# Patient Record
Sex: Female | Born: 1940
Health system: Southern US, Community
[De-identification: ages and names within clinical notes are randomized; demographics above are authoritative.]

## PROBLEM LIST (undated history)

## (undated) DIAGNOSIS — E782 Mixed hyperlipidemia: Secondary | ICD-10-CM

## (undated) DIAGNOSIS — I10 Essential (primary) hypertension: Secondary | ICD-10-CM

## (undated) DIAGNOSIS — K219 Gastro-esophageal reflux disease without esophagitis: Secondary | ICD-10-CM

## (undated) DIAGNOSIS — I2510 Atherosclerotic heart disease of native coronary artery without angina pectoris: Secondary | ICD-10-CM

## (undated) DIAGNOSIS — C50919 Malignant neoplasm of unspecified site of unspecified female breast: Secondary | ICD-10-CM

## (undated) DIAGNOSIS — I701 Atherosclerosis of renal artery: Secondary | ICD-10-CM

## (undated) DIAGNOSIS — I6522 Occlusion and stenosis of left carotid artery: Secondary | ICD-10-CM

## (undated) DIAGNOSIS — M1990 Unspecified osteoarthritis, unspecified site: Secondary | ICD-10-CM

## (undated) DIAGNOSIS — I639 Cerebral infarction, unspecified: Secondary | ICD-10-CM

## (undated) DIAGNOSIS — I83891 Varicose veins of right lower extremities with other complications: Secondary | ICD-10-CM

## (undated) DIAGNOSIS — G609 Hereditary and idiopathic neuropathy, unspecified: Secondary | ICD-10-CM

## (undated) DIAGNOSIS — R609 Edema, unspecified: Secondary | ICD-10-CM

## (undated) DIAGNOSIS — T4145XA Adverse effect of unspecified anesthetic, initial encounter: Secondary | ICD-10-CM

## (undated) DIAGNOSIS — Z9889 Other specified postprocedural states: Secondary | ICD-10-CM

## (undated) DIAGNOSIS — G4730 Sleep apnea, unspecified: Secondary | ICD-10-CM

## (undated) DIAGNOSIS — R112 Nausea with vomiting, unspecified: Secondary | ICD-10-CM

## (undated) HISTORY — PX: APPENDECTOMY: SHX54

## (undated) HISTORY — DX: Atherosclerotic heart disease of native coronary artery without angina pectoris: I25.10

## (undated) HISTORY — DX: Edema, unspecified: R60.9

## (undated) HISTORY — DX: Essential (primary) hypertension: I10

## (undated) HISTORY — PX: HEMORRHOID SURGERY: SHX153

## (undated) HISTORY — DX: Atherosclerosis of renal artery: I70.1

## (undated) HISTORY — DX: Mixed hyperlipidemia: E78.2

## (undated) HISTORY — PX: BREAST SURGERY: SHX581

## (undated) HISTORY — DX: Malignant neoplasm of unspecified site of unspecified female breast: C50.919

---
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
## 2001-12-26 ENCOUNTER — Ambulatory Visit (HOSPITAL_COMMUNITY): Admission: RE | Admit: 2001-12-26 | Discharge: 2001-12-26 | Payer: Self-pay | Admitting: Cardiology

## 2001-12-31 ENCOUNTER — Ambulatory Visit (HOSPITAL_COMMUNITY): Admission: RE | Admit: 2001-12-31 | Discharge: 2002-01-01 | Payer: Self-pay | Admitting: Cardiology

## 2002-02-16 ENCOUNTER — Encounter: Payer: Self-pay | Admitting: General Surgery

## 2002-02-24 ENCOUNTER — Ambulatory Visit (HOSPITAL_COMMUNITY): Admission: RE | Admit: 2002-02-24 | Discharge: 2002-02-24 | Payer: Self-pay | Admitting: General Surgery

## 2002-02-24 ENCOUNTER — Encounter (INDEPENDENT_AMBULATORY_CARE_PROVIDER_SITE_OTHER): Payer: Self-pay

## 2002-07-06 ENCOUNTER — Encounter: Admission: RE | Admit: 2002-07-06 | Discharge: 2002-08-11 | Payer: Self-pay | Admitting: Orthopaedic Surgery

## 2003-06-18 ENCOUNTER — Ambulatory Visit (HOSPITAL_COMMUNITY): Admission: RE | Admit: 2003-06-18 | Discharge: 2003-06-19 | Payer: Self-pay | Admitting: Cardiology

## 2003-12-07 ENCOUNTER — Ambulatory Visit: Payer: Self-pay | Admitting: Family Medicine

## 2004-01-23 DIAGNOSIS — Z923 Personal history of irradiation: Secondary | ICD-10-CM

## 2004-01-23 DIAGNOSIS — C50919 Malignant neoplasm of unspecified site of unspecified female breast: Secondary | ICD-10-CM

## 2004-01-23 HISTORY — PX: OTHER SURGICAL HISTORY: SHX169

## 2004-01-23 HISTORY — DX: Personal history of irradiation: Z92.3

## 2004-01-23 HISTORY — DX: Malignant neoplasm of unspecified site of unspecified female breast: C50.919

## 2004-03-08 ENCOUNTER — Ambulatory Visit: Payer: Self-pay | Admitting: Family Medicine

## 2004-03-15 ENCOUNTER — Ambulatory Visit: Payer: Self-pay

## 2004-04-07 ENCOUNTER — Ambulatory Visit: Payer: Self-pay | Admitting: Cardiology

## 2004-04-11 ENCOUNTER — Ambulatory Visit: Payer: Self-pay | Admitting: Cardiology

## 2004-04-12 ENCOUNTER — Ambulatory Visit: Payer: Self-pay | Admitting: Cardiology

## 2004-04-26 ENCOUNTER — Ambulatory Visit: Payer: Self-pay

## 2004-05-08 ENCOUNTER — Ambulatory Visit: Payer: Self-pay | Admitting: Cardiology

## 2004-05-12 ENCOUNTER — Ambulatory Visit (HOSPITAL_COMMUNITY): Admission: RE | Admit: 2004-05-12 | Discharge: 2004-05-13 | Payer: Self-pay | Admitting: Cardiology

## 2004-05-12 ENCOUNTER — Ambulatory Visit: Payer: Self-pay | Admitting: Internal Medicine

## 2004-05-29 ENCOUNTER — Ambulatory Visit: Payer: Self-pay | Admitting: Cardiology

## 2004-06-08 ENCOUNTER — Ambulatory Visit: Payer: Self-pay

## 2004-06-21 ENCOUNTER — Ambulatory Visit: Payer: Self-pay | Admitting: Cardiology

## 2004-07-10 ENCOUNTER — Ambulatory Visit: Payer: Self-pay | Admitting: Family Medicine

## 2004-10-06 ENCOUNTER — Ambulatory Visit: Payer: Self-pay

## 2004-10-23 ENCOUNTER — Ambulatory Visit: Payer: Self-pay | Admitting: Cardiology

## 2004-11-06 ENCOUNTER — Ambulatory Visit: Admission: RE | Admit: 2004-11-06 | Discharge: 2005-01-12 | Payer: Self-pay | Admitting: *Deleted

## 2004-11-13 ENCOUNTER — Ambulatory Visit: Payer: Self-pay | Admitting: Family Medicine

## 2004-11-28 ENCOUNTER — Ambulatory Visit: Payer: Self-pay | Admitting: Cardiology

## 2005-01-04 ENCOUNTER — Ambulatory Visit: Payer: Self-pay | Admitting: Cardiology

## 2005-05-30 ENCOUNTER — Ambulatory Visit: Payer: Self-pay

## 2005-06-29 ENCOUNTER — Ambulatory Visit: Payer: Self-pay | Admitting: Cardiology

## 2005-07-19 ENCOUNTER — Ambulatory Visit: Payer: Self-pay | Admitting: Family Medicine

## 2005-11-20 ENCOUNTER — Ambulatory Visit: Payer: Self-pay | Admitting: Family Medicine

## 2006-01-22 HISTORY — PX: ROTATOR CUFF REPAIR: SHX139

## 2006-03-12 ENCOUNTER — Ambulatory Visit: Payer: Self-pay | Admitting: Family Medicine

## 2006-05-07 ENCOUNTER — Ambulatory Visit: Payer: Self-pay | Admitting: Family Medicine

## 2006-05-30 ENCOUNTER — Ambulatory Visit: Payer: Self-pay | Admitting: Family Medicine

## 2006-06-12 ENCOUNTER — Ambulatory Visit: Payer: Self-pay

## 2006-06-12 ENCOUNTER — Ambulatory Visit: Payer: Self-pay | Admitting: Cardiology

## 2006-07-04 ENCOUNTER — Ambulatory Visit: Payer: Self-pay | Admitting: Family Medicine

## 2006-08-15 ENCOUNTER — Ambulatory Visit: Payer: Self-pay | Admitting: Cardiology

## 2006-08-16 ENCOUNTER — Encounter: Payer: Self-pay | Admitting: Physician Assistant

## 2006-08-19 ENCOUNTER — Ambulatory Visit: Payer: Self-pay | Admitting: Cardiology

## 2006-08-22 ENCOUNTER — Encounter: Payer: Self-pay | Admitting: Cardiology

## 2006-08-26 ENCOUNTER — Ambulatory Visit: Payer: Self-pay | Admitting: Cardiology

## 2006-09-03 ENCOUNTER — Ambulatory Visit: Payer: Self-pay | Admitting: Cardiology

## 2006-10-01 ENCOUNTER — Observation Stay (HOSPITAL_COMMUNITY): Admission: RE | Admit: 2006-10-01 | Discharge: 2006-10-03 | Payer: Self-pay | Admitting: Orthopaedic Surgery

## 2006-10-21 ENCOUNTER — Encounter: Admission: RE | Admit: 2006-10-21 | Discharge: 2006-11-18 | Payer: Self-pay | Admitting: Orthopaedic Surgery

## 2006-10-29 ENCOUNTER — Ambulatory Visit: Payer: Self-pay | Admitting: Cardiology

## 2006-10-29 ENCOUNTER — Encounter: Payer: Self-pay | Admitting: Physician Assistant

## 2007-06-18 ENCOUNTER — Ambulatory Visit: Payer: Self-pay | Admitting: Cardiology

## 2007-07-22 ENCOUNTER — Ambulatory Visit: Payer: Self-pay

## 2007-09-09 ENCOUNTER — Encounter: Payer: Self-pay | Admitting: Cardiology

## 2008-01-05 ENCOUNTER — Ambulatory Visit: Payer: Self-pay | Admitting: Cardiology

## 2008-02-05 ENCOUNTER — Encounter: Payer: Self-pay | Admitting: Cardiology

## 2008-07-29 ENCOUNTER — Encounter: Payer: Self-pay | Admitting: Cardiology

## 2008-07-29 ENCOUNTER — Ambulatory Visit: Payer: Self-pay

## 2008-07-29 DIAGNOSIS — I701 Atherosclerosis of renal artery: Secondary | ICD-10-CM

## 2008-08-05 ENCOUNTER — Ambulatory Visit: Payer: Self-pay | Admitting: Cardiology

## 2008-10-26 ENCOUNTER — Encounter: Payer: Self-pay | Admitting: Cardiology

## 2008-11-07 DIAGNOSIS — R609 Edema, unspecified: Secondary | ICD-10-CM

## 2008-11-07 DIAGNOSIS — I2510 Atherosclerotic heart disease of native coronary artery without angina pectoris: Secondary | ICD-10-CM

## 2008-11-07 DIAGNOSIS — R600 Localized edema: Secondary | ICD-10-CM | POA: Insufficient documentation

## 2008-11-07 DIAGNOSIS — I739 Peripheral vascular disease, unspecified: Secondary | ICD-10-CM | POA: Insufficient documentation

## 2008-11-07 DIAGNOSIS — E785 Hyperlipidemia, unspecified: Secondary | ICD-10-CM

## 2008-11-21 ENCOUNTER — Emergency Department (HOSPITAL_COMMUNITY): Admission: EM | Admit: 2008-11-21 | Discharge: 2008-11-21 | Payer: Self-pay | Admitting: Emergency Medicine

## 2009-02-03 ENCOUNTER — Encounter: Payer: Self-pay | Admitting: Cardiology

## 2009-03-17 ENCOUNTER — Ambulatory Visit: Payer: Self-pay | Admitting: Cardiology

## 2009-03-17 DIAGNOSIS — I10 Essential (primary) hypertension: Secondary | ICD-10-CM

## 2009-03-17 DIAGNOSIS — I6529 Occlusion and stenosis of unspecified carotid artery: Secondary | ICD-10-CM

## 2009-03-28 ENCOUNTER — Encounter: Payer: Self-pay | Admitting: Cardiology

## 2009-04-01 ENCOUNTER — Encounter: Payer: Self-pay | Admitting: Cardiology

## 2009-04-12 ENCOUNTER — Encounter (INDEPENDENT_AMBULATORY_CARE_PROVIDER_SITE_OTHER): Payer: Self-pay | Admitting: *Deleted

## 2009-07-07 ENCOUNTER — Emergency Department (HOSPITAL_COMMUNITY): Admission: EM | Admit: 2009-07-07 | Discharge: 2009-07-07 | Payer: Self-pay | Admitting: Emergency Medicine

## 2009-07-07 ENCOUNTER — Encounter: Payer: Self-pay | Admitting: Cardiology

## 2009-07-09 ENCOUNTER — Encounter: Payer: Self-pay | Admitting: Cardiology

## 2009-07-29 ENCOUNTER — Encounter: Payer: Self-pay | Admitting: Cardiology

## 2009-08-01 ENCOUNTER — Encounter: Payer: Self-pay | Admitting: Cardiovascular Disease

## 2009-08-01 ENCOUNTER — Ambulatory Visit: Payer: Self-pay

## 2009-08-01 ENCOUNTER — Encounter: Payer: Self-pay | Admitting: Cardiology

## 2009-08-19 ENCOUNTER — Ambulatory Visit: Payer: Self-pay | Admitting: Cardiology

## 2009-09-15 ENCOUNTER — Encounter (INDEPENDENT_AMBULATORY_CARE_PROVIDER_SITE_OTHER): Payer: Self-pay | Admitting: *Deleted

## 2009-09-15 DIAGNOSIS — IMO0002 Reserved for concepts with insufficient information to code with codable children: Secondary | ICD-10-CM

## 2009-09-27 ENCOUNTER — Encounter: Payer: Self-pay | Admitting: Infectious Disease

## 2009-09-27 ENCOUNTER — Encounter (INDEPENDENT_AMBULATORY_CARE_PROVIDER_SITE_OTHER): Payer: Self-pay | Admitting: *Deleted

## 2009-09-27 ENCOUNTER — Ambulatory Visit: Payer: Self-pay | Admitting: Internal Medicine

## 2009-09-28 ENCOUNTER — Encounter: Payer: Self-pay | Admitting: Internal Medicine

## 2009-12-19 IMAGING — CR DG HIP COMPLETE 2+V*R*
3 series · 3 of 3 positions shown · non-contrast
Comparison: None

CLINICAL DATA: Fall.  Right posterior hip pain.

RIGHT HIP - COMPLETE 2+ VIEW

[t pelvis a.p.]
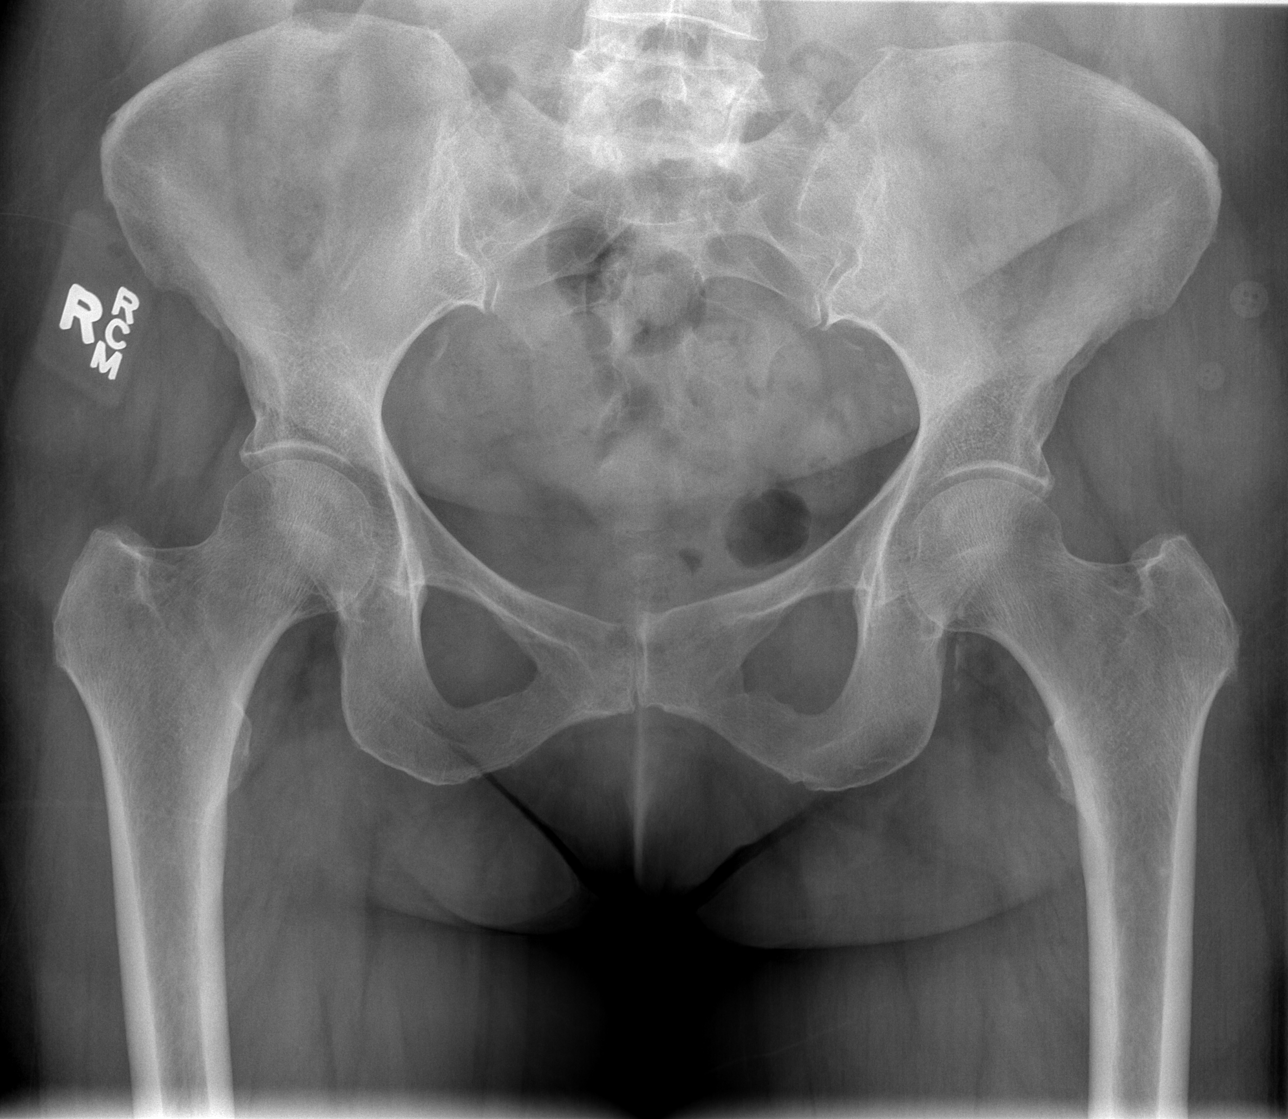

[t hip ap right]
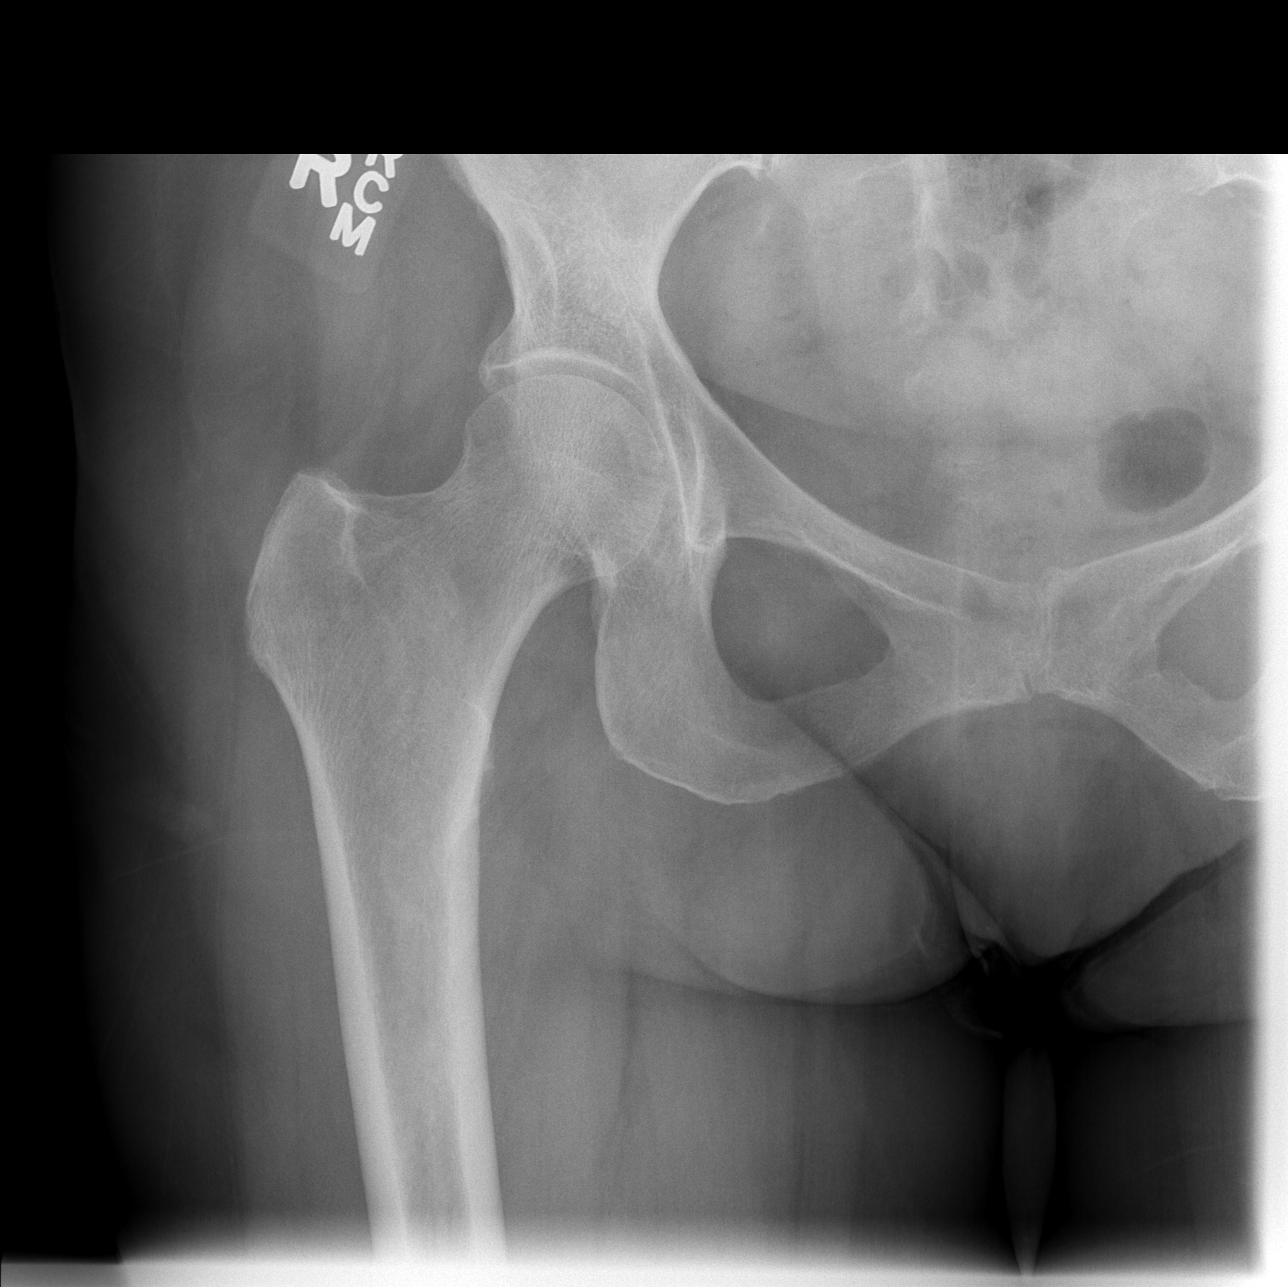

[t hip frog leg right]
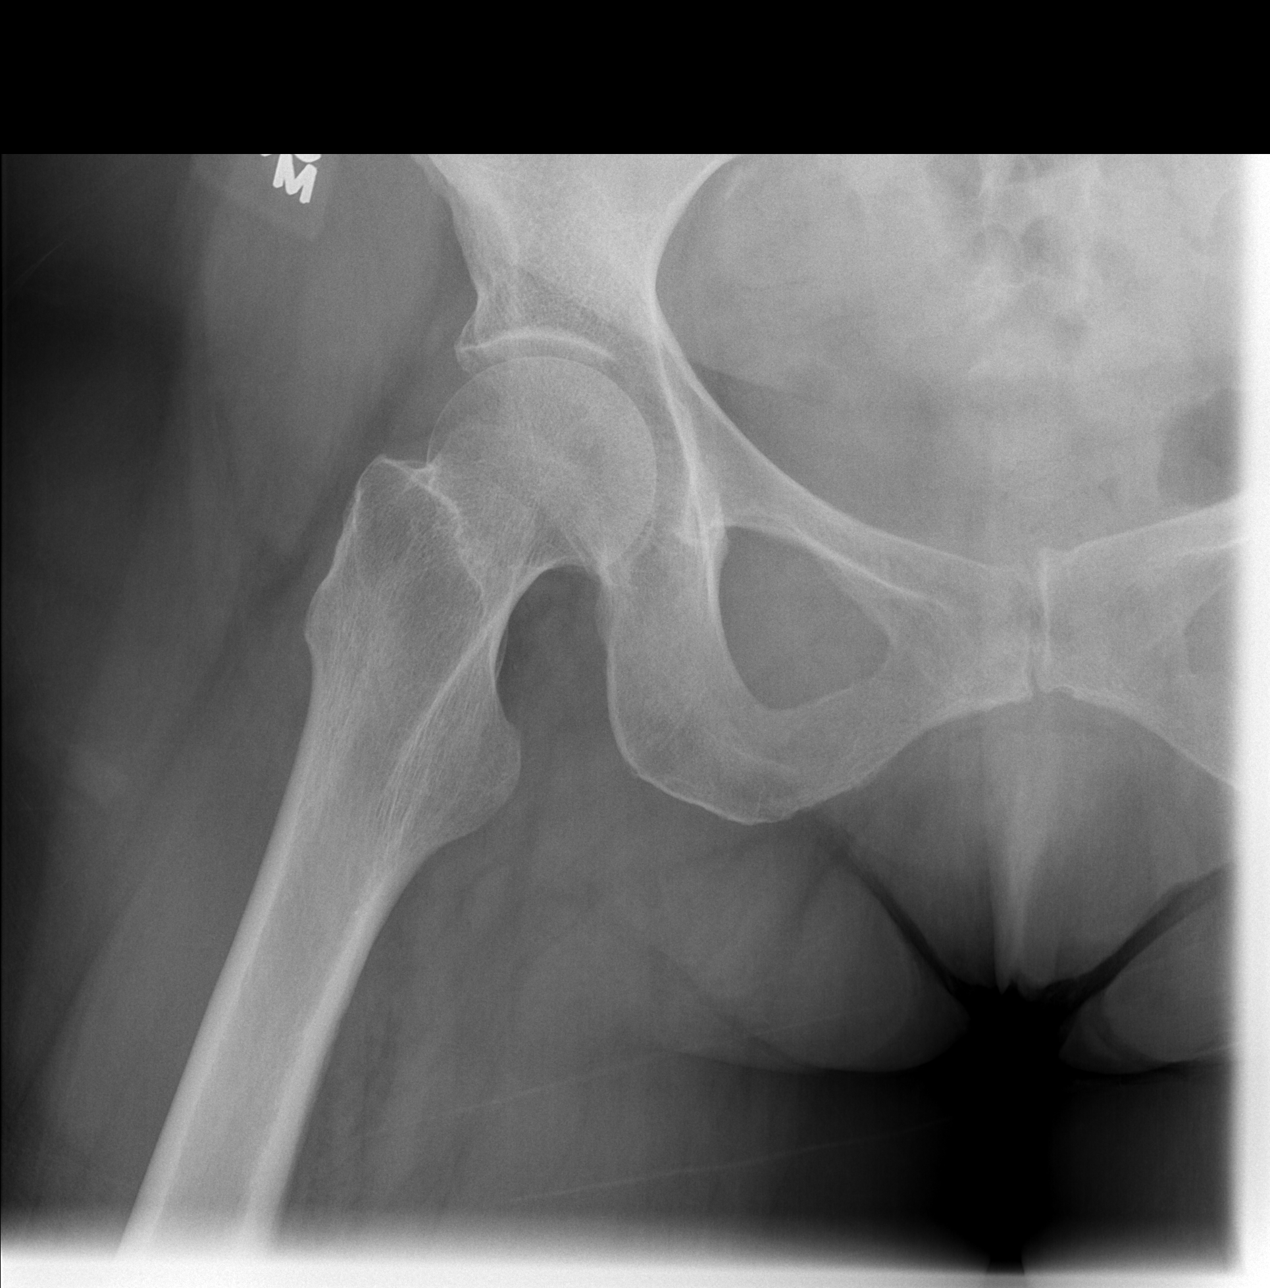

[3 of 3 positions shown; findings below may reference images not displayed]

FINDINGS: No fracture or subluxation.
IMPRESSION: Negative right hip.

## 2009-12-19 IMAGING — CR DG KNEE COMPLETE 4+V*R*
4 series · 4 of 4 positions shown · non-contrast
Comparison: None

CLINICAL DATA: Right knee pain, bruising, and swelling secondary to
a fall today.

RIGHT KNEE - COMPLETE 4+ VIEW

[t knee ap right]
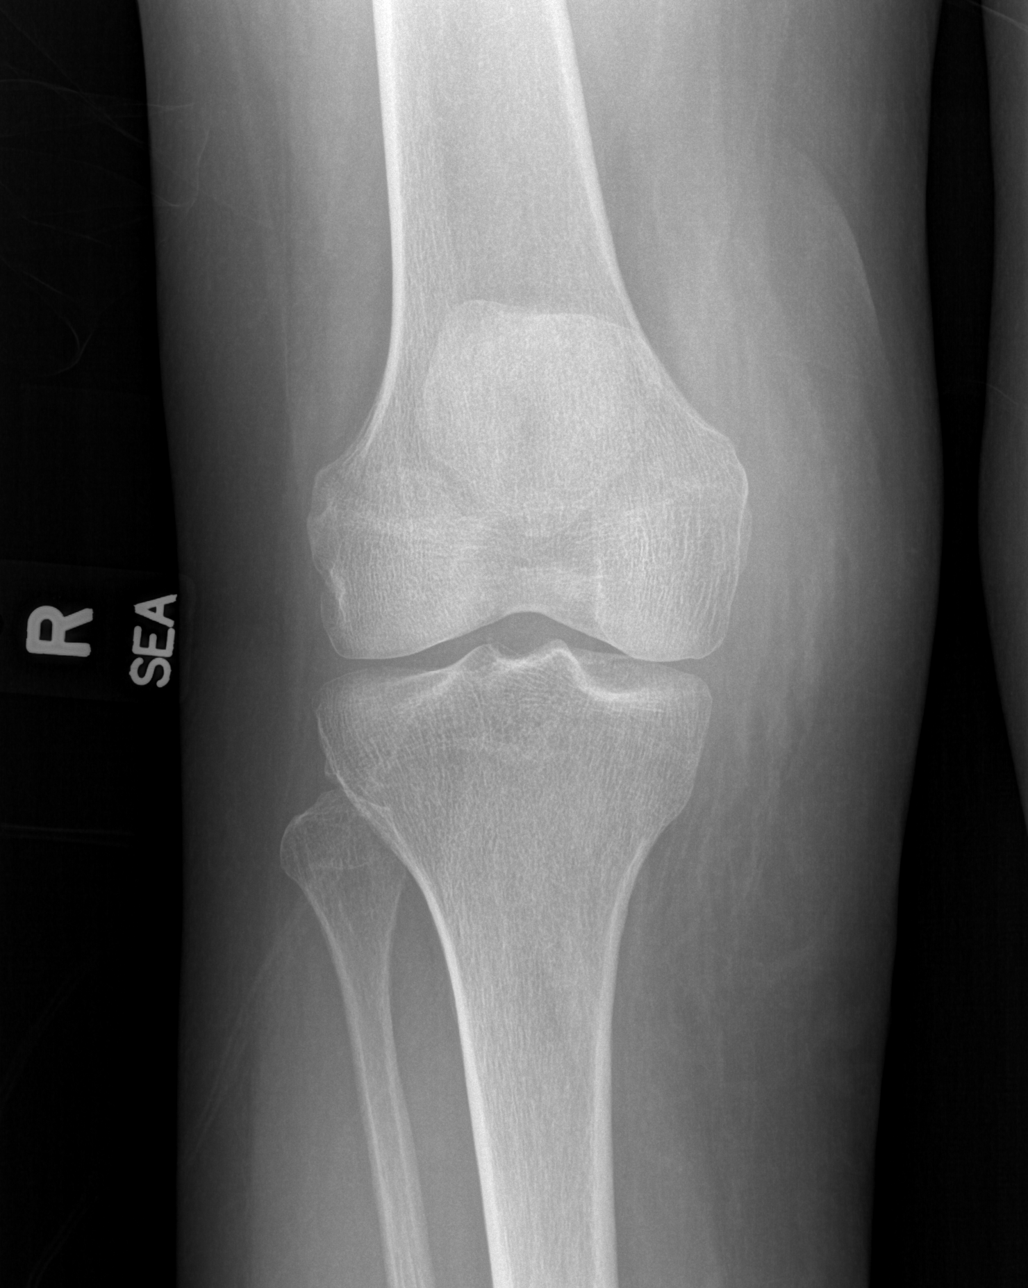

[t knee oblique right (1 of 2)]
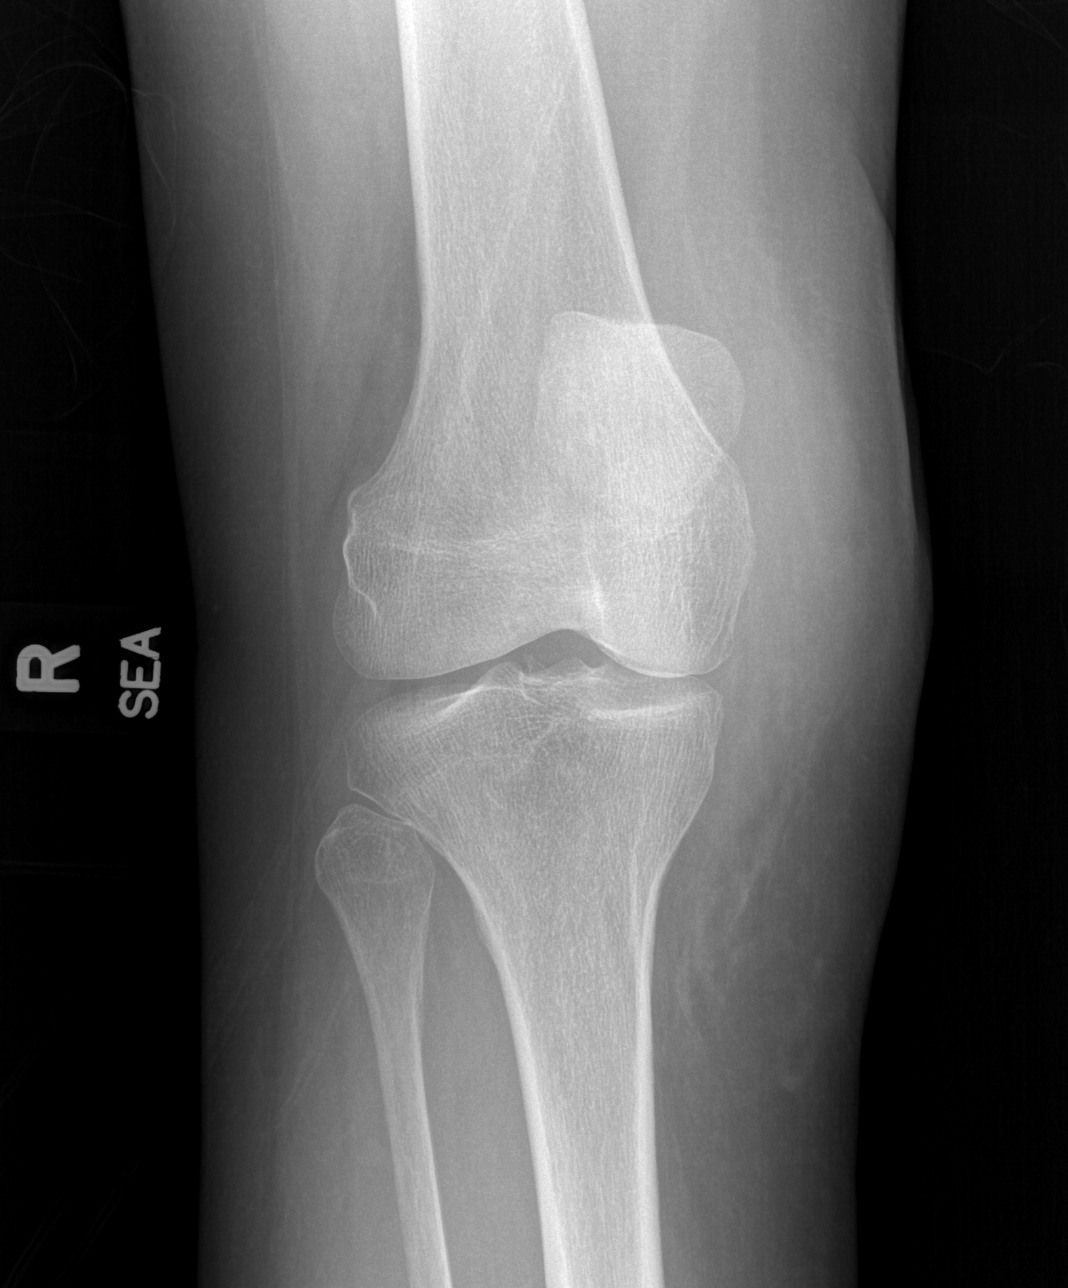

[t knee oblique right (2 of 2)]
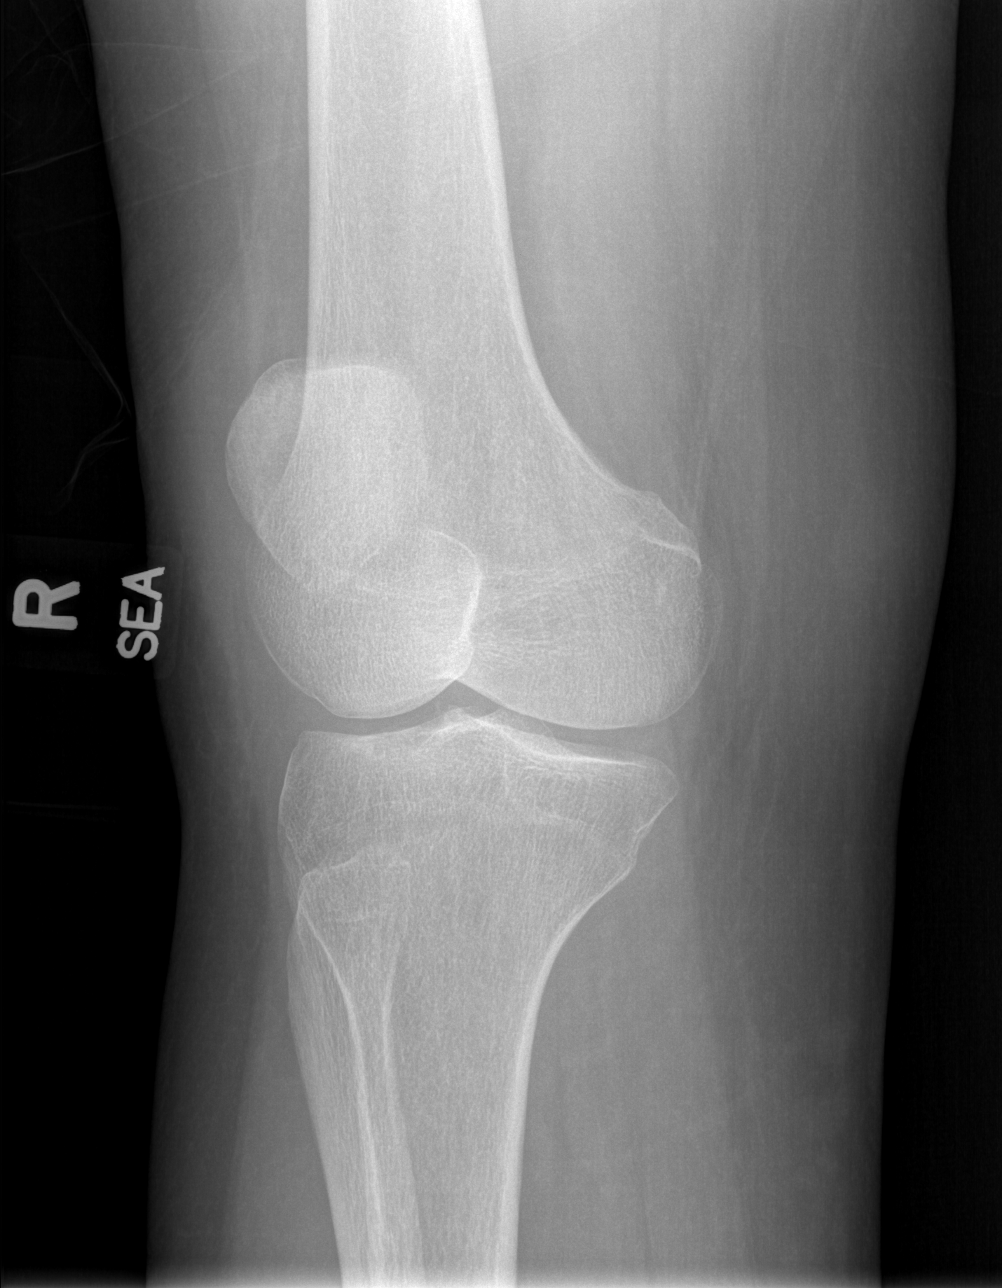

[t knee lat right]
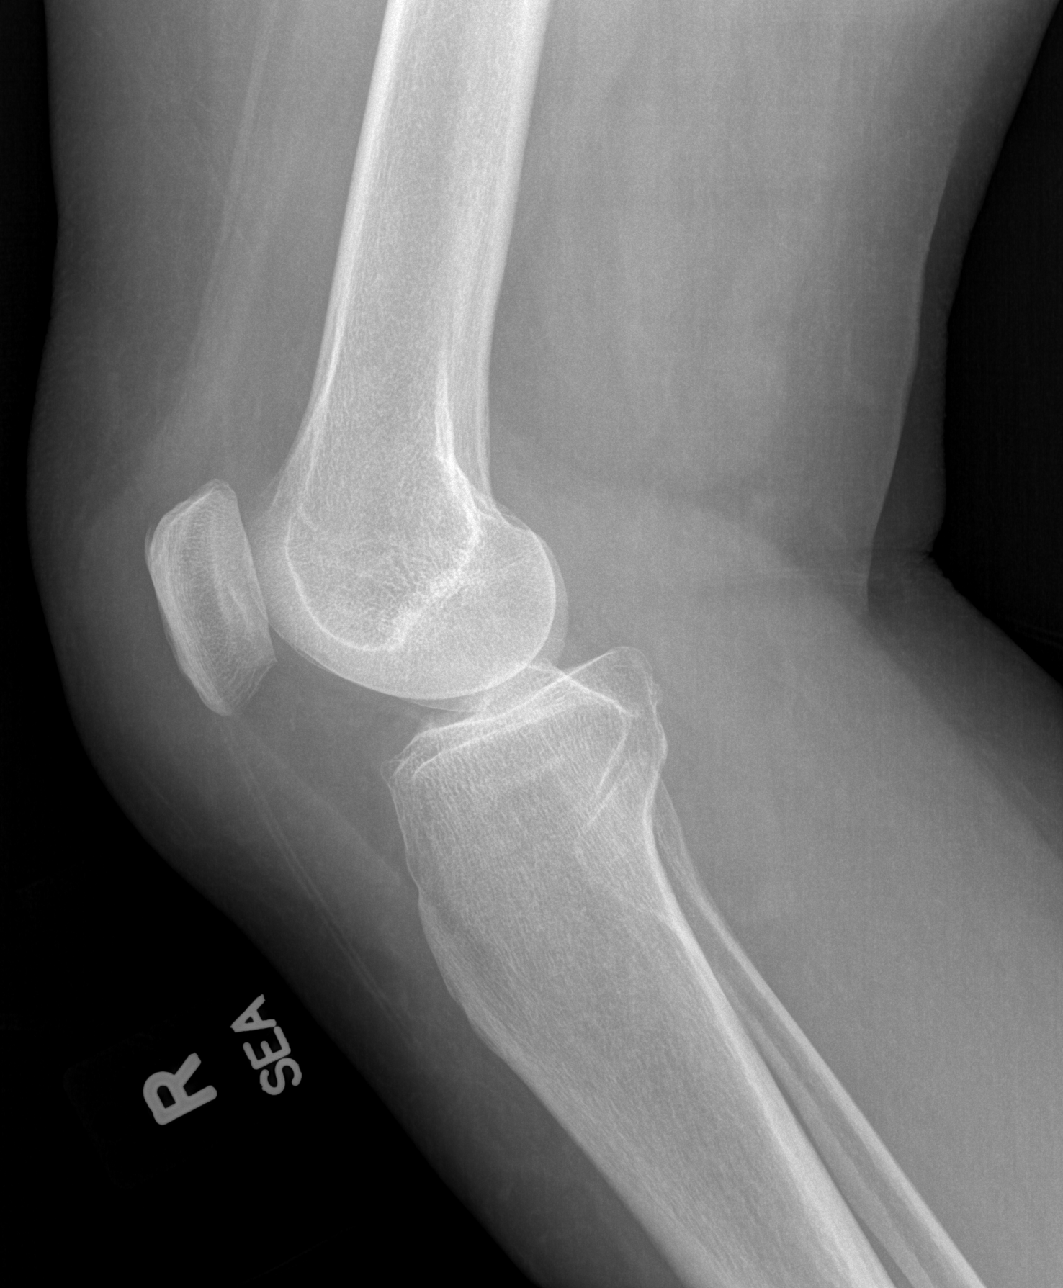

[4 of 4 positions shown; findings below may reference images not displayed]

FINDINGS: There is no fracture, dislocation, or joint effusion.
There is prominent swelling of the soft tissues in the anterior
medial aspect of the knee joint.
IMPRESSION: Soft tissue contusion.  No acute bony abnormality.

## 2010-01-30 ENCOUNTER — Ambulatory Visit
Admission: RE | Admit: 2010-01-30 | Discharge: 2010-01-30 | Payer: Self-pay | Source: Home / Self Care | Attending: Cardiology | Admitting: Cardiology

## 2010-01-30 ENCOUNTER — Encounter: Payer: Self-pay | Admitting: Cardiology

## 2010-01-30 DIAGNOSIS — R002 Palpitations: Secondary | ICD-10-CM | POA: Insufficient documentation

## 2010-02-21 NOTE — Procedures (Signed)
Summary: Holter and Event/ [facility]  Holter and Event/ [facility]   Imported By: Dorise Hiss 03/16/2009 11:23:04  _____________________________________________________________________  External Attachment:    Type:   Image     Comment:   External Document

## 2010-02-21 NOTE — Assessment & Plan Note (Signed)
Summary: New pt forearm cellulitis   Primary Provider:  Dr. Joette King  CC:  cellulits and right arm.  History of Present Illness: [name] is a 70 year old who is referred to me by Dr. Ardeen King for evaluation of left arm cellulitis and abscess.  She says that she developed a small painless blister in the web space between her left thumb and index finger in late May of this year.  She does not recall any injury to that area prior to development of the blister.  Over the next two weeks the blister slowly enlarged and became somewhat painful.  She began to note red streaks up her arm with scattered nodules and tenderness in her left axilla.  She does not recall having any fever or chills but has been having periodic sweats.  She was seen for the first time on June 16 and was admitted to [facility].  As best I can tell she was given a dose of IV vancomycin and developed red man reaction and was switched to IV tigecycline. She was discharged on June 18 on oral Septra.  One note says that she developed some intolerance of it with nausea and abdominal pain.  The medication was changed to doxycycline and then Suprax was added on June 27.  An office note on July 18 said that she developed some new pruritic "pimples" on her left arm.  She was restarted on doxycycline and Suprax at that time.  She was seen in the office on 4 or more occasions over the next month.  She had persisting evidence of infection and was treated with intranasal Bactroban and at one point switch back to Septra which he apparently tolerated.  At the visit on August 22 the note says that she had a large lesion in the left antecubital fossa that was "full of pus". She was treated with doxycycline, Septra, and Levaquin but apparently failed this and was readmitted to [facility] on August 25. I do not have a copy of the admission history and physical exam describing the lesion. She was premedicated with Benadryl and  given IV vancomycin which he apparently tolerated.  The discharge summary says that she improved slowly and was discharged home one day later complete one more week of IV vancomycin around the first of September.  She was then restarted on oral Septra which he is still taking.  The left antecubital fossa lesion opened spontaneously but she says it did not drain and has been slowly healing over the past two weeks.  She says she has also had a recent knot on her right buttock which is now healed and her left upper thigh.  I cannot find any evidence that any of the lesions were ever cultured.  Notes indicate that MRSA is the leading suspect.  Preventive Screening-Counseling & Management  Alcohol-Tobacco     Alcohol drinks/day: 0     Smoking Status: quit     Year Quit: 2000  Caffeine-Diet-Exercise     Caffeine use/day: yes     Does Patient Exercise: no  Safety-Violence-Falls     Seat Belt Use: 100   Current Allergies (reviewed today): ! PCN ! CODEINE ! VANCOMYCIN Additional History Menstrual Status:  postmenopausal  Vital Signs:  Patient profile:   70 year old female Menstrual status:  postmenopausal Height:      63 inches (160.02 cm) Weight:      160.75 pounds (73.07 kg) BMI:     28.58 Temp:  97.7 degrees F (36.50 degrees C) oral Pulse rate:   81 / minute BP sitting:   132 / 72  (right arm) Cuff size:   large  Vitals Entered By: Tamara Maduro RN (September 27, 2009 1:55 PM) CC: cellulits, right arm Is Patient Diabetic? No Pain Assessment Patient in pain? no      Nutritional Status BMI of 25 - 29 = overweight Nutritional Status Detail appetite "too good"  Have you ever been in a relationship where you felt threatened, hurt or afraid?no asked   Does patient need assistance? Functional Status Self care Ambulation Normal     Menstrual Status postmenopausal   Physical Exam  General:  alert and overweight-appearing.   Mouth:  pharynx pink and moist, no  erythema, and no exudates.   Lungs:  normal breath sounds, no crackles, and no wheezes.   Heart:  normal rate, regular rhythm, and no murmur.   Skin:  the skin in her left antecubital fossa is a reddish purple.  there is a 1 inch open lesion with a central dark scab. the lesion is not draining.  She has a were sized lesion on her left upper inner thigh with a 5-mm dark center.  There is no cellulitis or drainage it is not fluctuant   Impression & Recommendations:  Problem # 1:  CELLULITIS AND ABSCESS OF UPPER ARM AND FOREARM (ICD-682.3) All of her lesions are healing and quite nonspecific at this time.  Certainly MRSA could be the culprit but her description of the original onset in late May and early June suggests nodular lymphangitis raising the possibility of opportunistic organisms such as sporotrichosis, Nocardia and Mycobacterium marinum although she has no specific risk factors for these organisms.  I will have her complete her oral Septra.  If she has any recurrent lesions I would strongly suggest having her see a surgeon to obtain a biopsy with specimen sent for pathology, Gram stain, AFB and fungal stains and cultures. I've asked her to call me if she develops new lesions.  The following medications were removed from the medication list:    Suprax 200 Mg/24ml Susr (Cefixime) ..... One dose by mouth daily Her updated medication list for this problem includes:    Aspirin 81 Mg Tabs (Aspirin) .Marland Kitchen... Take 1 tablet by mouth once a day    Tylenol 325 Mg Tabs (Acetaminophen) .Marland Kitchen... As needed    Septra Ds 800-160 Mg Tabs (Sulfamethoxazole-trimethoprim) .Marland Kitchen... Take 1 tablet by mouth two times a day  Orders: Consultation Level III ([address]109)  Medications Added to Medication List This Visit: 1)  Septra Ds 800-160 Mg Tabs (Sulfamethoxazole-trimethoprim) .... Take 1 tablet by mouth two times a day  Patient Instructions: 1)  Please schedule a follow-up appointment as needed. 2)  Please call if you  develop new skin lesions.  Appended Document: New pt forearm cellulitis The non-specific L thigh lesion grew VRE which is likely a colonizer. I would not treat it at this time.The lesion did not look actively infected

## 2010-02-21 NOTE — Miscellaneous (Signed)
Summary: Orders Update  Clinical Lists Changes  Orders: Added new Test order of Renal Artery Duplex (Renal Artery Duplex) - Signed 

## 2010-02-21 NOTE — Consult Note (Signed)
Summary: New Pt. Referral:Dr. Nyland  New Pt. Referral:Dr. Nyland   Imported By: Florinda Marker 09/30/2009 11:56:41  _____________________________________________________________________  External Attachment:    Type:   Image     Comment:   External Document

## 2010-02-21 NOTE — Assessment & Plan Note (Signed)
Summary: DOCTOR ONLY  6 MO FU PER JAN REMINDER-SRS   Visit Type:  Follow-up Primary Provider:  Dr. Joette Catching   History of Present Illness: 70 year old woman presents for a followup visit. She denies any exertional angina. She reports being "tired" a lot of the time. She has not been exercising regularly. She indicates that amlodipine had to be cut back by Dr. Lysbeth Galas related to side effect of lower extremity edema. Her blood pressure is less well controlled.  Lipids have been followed by Dr. Lysbeth Galas. She will be due for followup renal artery duplex and carotid duplex later this year.   Preventive Screening-Counseling & Management  Alcohol-Tobacco     Smoking Status: quit     Year Quit: 2000  Current Medications (verified): 1)  Furosemide 20 Mg Tabs (Furosemide) .... Take 1/2 Tablet By Mouth Once A Day 2)  Amlodipine Besylate 5 Mg Tabs (Amlodipine Besylate) .... Take 1 Tablet By Mouth Once A Day 3)  Aspirin 81 Mg Tabs (Aspirin) .... Take 1 Tablet By Mouth Once A Day 4)  Crestor 40 Mg Tabs (Rosuvastatin Calcium) .... Take 1 Tablet By Mouth Every Night 5)  Diovan 160 Mg Tabs (Valsartan) .Marland Kitchen.. 1 Tablet By Mouth Two Times A Day 6)  Nexium 40 Mg Cpdr (Esomeprazole Magnesium) .... Take 1 Tablet By Mouth Once A Day 7)  Plavix 75 Mg Tabs (Clopidogrel Bisulfate) .... Take 1 Tablet By Mouth Once A Day 8)  Caltrate 600+d 600-400 Mg-Unit Tabs (Calcium Carbonate-Vitamin D) .... Take 1 Tablet By Mouth Once A Day 9)  Lexapro 10 Mg Tabs (Escitalopram Oxalate) .... Take 1 Tablet By Mouth Once A Day 10)  Meloxicam 7.5 Mg Tabs (Meloxicam) .... Take 1 Tablet By Mouth Two Times A Day 11)  Tylenol 325 Mg Tabs (Acetaminophen) .... As Needed  Allergies (verified): 1)  ! Pcn  Comments:  Nurse/Medical Assistant: The patient's medications and allergies were reviewed with the patient and were updated in the Medication and Allergy Lists. List reviewed.  Past History:  Social History: Last updated:  03/16/2009 Married  Tobacco Use - No Alcohol Use - no  Past Medical History: Renal artery stenosis - BMS bilaterally 2003, PTCA bilaterally 2005 with restenosis CAD - nonobstructive at catheterization 2003, LVEF normal Hyperlipidemia Hypertension Chronic lower extremity edema Adenocarcinoma of right breast  Past Surgical History: Hemorrhoid surgery Right rotator cuff repair 2008 Right breast lumpectomy  Social History: Smoking Status:  quit  Review of Systems       The patient complains of peripheral edema.  The patient denies anorexia, fever, weight loss, chest pain, syncope, prolonged cough, headaches, hemoptysis, abdominal pain, melena, and hematochezia.         Lower extremity edema has improved with decrease in amlodipine. Otherwise reviewed and negative.  Vital Signs:  Patient profile:   70 year old female Height:      63 inches Weight:      162 pounds BMI:     28.80 Pulse rate:   72 / minute BP sitting:   160 / 90  (left arm) Cuff size:   regular  Vitals Entered By: Carlye Grippe (March 17, 2009 1:40 PM)  Nutrition Counseling: Patient's BMI is greater than 25 and therefore counseled on weight management options.   Physical Exam  Additional Exam:  Normally nourished appearing woman, no acute distress. HEENT: Conjunctiva and lids normal, oropharynx clear. Neck: Supple, no elevated jugular venous pressure, soft right carotid bruit. Lungs: Clear to auscultation, nonlabored. Cardiac: Regular  rate and rhythm, no S3. Abdomen: Soft, no bruits, bowel sounds present. Skin: Warm and dry. Extremities: Pitting edema, trace ankle edema, distal pulses one plus. Musculoskeletal: No kyphosis. Neuropsychiatric: Alert and oriented x3, affect appropriate.   EKG  Procedure date:  03/17/2009  Findings:      Normal sinus rhythm at 67 beats per minute, nonspecific T wave changes.  Impression & Recommendations:  Problem # 1:  CAD, NATIVE VESSEL  (ICD-414.01)  History of nonobstructive cardiovascular disease, presently without angina. Will continue a strategy of risk factor modification. Followup will be arranged in 6 months.  The following medications were removed from the medication list:    Metoprolol Succinate 100 Mg Xr24h-tab (Metoprolol succinate)    Aspir-low 81 Mg Tbec (Aspirin) .Marland Kitchen... Take 1 tablet by mouth once a day Her updated medication list for this problem includes:    Amlodipine Besylate 5 Mg Tabs (Amlodipine besylate) .Marland Kitchen... Take 1 tablet by mouth once a day    Aspirin 81 Mg Tabs (Aspirin) .Marland Kitchen... Take 1 tablet by mouth once a day    Plavix 75 Mg Tabs (Clopidogrel bisulfate) .Marland Kitchen... Take 1 tablet by mouth once a day  Orders: EKG w/ Interpretation (93000)  Problem # 2:  RENAL ARTERY ATHEROSCLEROSIS (ICD-440.1)  History of bilateral renal artery stenosis status post stent placement and subsequent angioplasty related to restenosis. Last duplex scan in 2010 demonstrated no obvious flow-limiting obstruction, although some progression in disease. a followup scan will be obtained around July of this year.  Orders: T-Basic Metabolic Panel [id number])  Problem # 3:  ESSENTIAL HYPERTENSION, BENIGN (ICD-401.1)  Blood pressure not optimally controlled. Given the reduction and amlodipine, we will advance Diovan to 160 mg twice daily. A followup BMET will be obtained in 2 weeks.  The following medications were removed from the medication list:    Hydrochlorothiazide 12.5 Mg Tabs (Hydrochlorothiazide) .Marland Kitchen... Take 1 tablet by mouth once a day    Metoprolol Succinate 100 Mg Xr24h-tab (Metoprolol succinate)    Aspir-low 81 Mg Tbec (Aspirin) .Marland Kitchen... Take 1 tablet by mouth once a day Her updated medication list for this problem includes:    Furosemide 20 Mg Tabs (Furosemide) .Marland Kitchen... Take 1/2 tablet by mouth once a day    Amlodipine Besylate 5 Mg Tabs (Amlodipine besylate) .Marland Kitchen... Take 1 tablet by mouth once a day    Aspirin 81 Mg Tabs  (Aspirin) .Marland Kitchen... Take 1 tablet by mouth once a day    Diovan 160 Mg Tabs (Valsartan) .Marland Kitchen... 1 tablet by mouth two times a day  Orders: T-Basic Metabolic Panel ([id number])  Problem # 4:  CAROTID ARTERY DISEASE (ICD-433.10)  Patient also due for a followup carotid duplex. This will be arranged for July as well.  The following medications were removed from the medication list:    Aspir-low 81 Mg Tbec (Aspirin) .Marland Kitchen... Take 1 tablet by mouth once a day Her updated medication list for this problem includes:    Aspirin 81 Mg Tabs (Aspirin) .Marland Kitchen... Take 1 tablet by mouth once a day    Plavix 75 Mg Tabs (Clopidogrel bisulfate) .Marland Kitchen... Take 1 tablet by mouth once a day  Patient Instructions: 1)  Increase Diovan to 160mg  by mouth two times a day. A new prescription has been sent to your pharmacy relecting this change. 2)  Your physician recommends that you go to the [facility] for lab work: DO IN 2 WEEKS. 3)  You need renal and carotid dopplers around July 2011. This will be done in our  [place] office. 4)  We will obtain your recent labs from Dr. Lysbeth Galas.  5)  Your physician wants you to follow-up in: 6 months. You will receive a reminder letter in the mail one-two months in advance. If you don't receive a letter, please call our office to schedule the follow-up appointment. Prescriptions: DIOVAN 160 MG TABS (VALSARTAN) 1 tablet by mouth two times a day  #60 x 11   Entered by:   Cyril Loosen, RN, BSN   Authorized by:   Loreli Slot, MD, [facility]   Signed by:   Cyril Loosen, RN, BSN on 03/17/2009   Method used:   Electronically to        [facility] [phone]* (retail)       [address]       [place], [place]  [address]       Ph: [phone] or [phone]       Fax: [phone]   RxID:   [id number]

## 2010-02-21 NOTE — Letter (Signed)
Summary: Engineer, materials at [facility]  [address]   [place], [place] [address]   Phone: [phone]  Fax: [phone]        April 12, 2009 MRN: [id number]    OLIMPIA TINCH [address] [place], [place]  [address]    Dear Ms. Tibbs,  Your test ordered by Selena Batten has been reviewed by your physician (or physician assistant) and was found to be normal or stable. Your physician (or physician assistant) felt no changes were needed at this time.  ____ Echocardiogram  ____ Cardiac Stress Test  __X__ Lab Work- Per Dr. Diona Browner, LDL (bad cholesterol) is at goal and liver function labs are okay. Kidney function and potassium are stable.  ____ Peripheral vascular study of arms, legs or neck  ____ CT scan or X-ray  ____ Lung or Breathing test  ____ Other:   Thank you.   Cyril Loosen, RN, BSN    Duane Boston, M.D., F.A.C.C. Thressa Sheller, M.D., F.A.C.C. Oneal Grout, M.D., F.A.C.C. Cheree Ditto, M.D., F.A.C.C. Daiva Nakayama, M.D., F.A.C.C. Kenney Houseman, M.D., F.A.C.C. Jeanne Ivan, PA-C

## 2010-02-21 NOTE — Miscellaneous (Signed)
Summary: HIPAA Restrictions  HIPAA Restrictions   Imported By: Florinda Marker 09/28/2009 10:05:49  _____________________________________________________________________  External Attachment:    Type:   Image     Comment:   External Document

## 2010-02-21 NOTE — Letter (Signed)
Summary: [facility]  [facility]   Imported By: Zachary George 03/17/2009 10:53:20  _____________________________________________________________________  External Attachment:    Type:   Image     Comment:   External Document

## 2010-02-21 NOTE — Miscellaneous (Signed)
Summary: Orders Update - left thigh culture  Clinical Lists Changes  Orders: Added new Test order of T-Culture, Wound ([id number]) - Signed     Process Orders Check Orders Results:     [facility]: Check successful Order queued for requisitioning for [facility]: September 27, 2009 2:52 PM  Tests Sent for requisitioning (September 27, 2009 2:52 PM):     09/27/2009: [facility] -- T-Culture, Wound [[id number]] (signed)

## 2010-02-21 NOTE — Miscellaneous (Signed)
Summary: Problem list updated  Clinical Lists Changes  Problems: Added new problem of CELLULITIS AND ABSCESS OF UPPER ARM AND FOREARM (ICD-682.3)

## 2010-02-21 NOTE — Letter (Signed)
Summary: Discharge [facility]  Discharge [facility]   Imported By: Dorise Hiss 08/19/2009 10:12:40  _____________________________________________________________________  External Attachment:    Type:   Image     Comment:   External Document

## 2010-02-21 NOTE — Miscellaneous (Signed)
Summary: Allergies updated  Clinical Lists Changes  Allergies: Added new allergy or adverse reaction of CODEINE Added new allergy or adverse reaction of VANCOMYCIN

## 2010-02-21 NOTE — Assessment & Plan Note (Signed)
Summary: 6 MO FU PER AUG REMINDER-SRS   Visit Type:  Follow-up Primary Provider:  Dr. Joette Catching   History of Present Illness: 70 year old woman presents for followup. She denies any problems with angina, and reports compliance with her medications. She did develop some trouble with leg pain and was taken off of Crestor by Dr. Lysbeth Galas. Her last lipid profile as outlined below.  Followup labs from March showed BUN 15, creatinine 0.9, AST 25, ALT 19, cholesterol 152, triglycerides 97, HDL 59, LDL 74, potassium 4.6. Recent followup renal artery duplex and carotid duplex are outlined below.  I note that the patient was admitted to [facility] back in June with cellulitis and lymphangitis of the left hand and forearm, presently continuing an outpatient antibiotic course.  Preventive Screening-Counseling & Management  Alcohol-Tobacco     Smoking Status: quit     Year Quit: 2000  Current Medications (verified): 1)  Furosemide 20 Mg Tabs (Furosemide) .... Take 1/2 Tablet By Mouth Once A Day 2)  Amlodipine Besylate 5 Mg Tabs (Amlodipine Besylate) .... Take 1 Tablet By Mouth Once A Day 3)  Aspirin 81 Mg Tabs (Aspirin) .... Take 1 Tablet By Mouth Once A Day 4)  Diovan 160 Mg Tabs (Valsartan) .Marland Kitchen.. 1 Tablet By Mouth Two Times A Day 5)  Nexium 40 Mg Cpdr (Esomeprazole Magnesium) .... Take 1 Tablet By Mouth Once A Day 6)  Plavix 75 Mg Tabs (Clopidogrel Bisulfate) .... Take 1 Tablet By Mouth Once A Day 7)  Caltrate 600+d 600-400 Mg-Unit Tabs (Calcium Carbonate-Vitamin D) .... Take 1 Tablet By Mouth Once A Day 8)  Lexapro 10 Mg Tabs (Escitalopram Oxalate) .... Take 1 Tablet By Mouth Once A Day 9)  Meloxicam 7.5 Mg Tabs (Meloxicam) .... Take 1 Tablet By Mouth Two Times A Day 10)  Tylenol 325 Mg Tabs (Acetaminophen) .... As Needed 11)  Align  Caps (Probiotic Product) .... Take 1 Tablet By Mouth Once A Day 12)  Doxycycline Hyclate 100 Mg Tabs (Doxycycline Hyclate) .... Take 1 Tablet By Mouth Two Times A  Day 13)  Suprax 200 Mg/52ml Susr (Cefixime) .... One Dose By Mouth Daily  Allergies: 1)  ! Pcn  Comments:  Nurse/Medical Assistant: The patient's medication list and allergies were reviewed with the patient and were updated in the Medication and Allergy Lists.  Past History:  Past Medical History: Last updated: 03/17/2009 Renal artery stenosis - BMS bilaterally 2003, PTCA bilaterally 2005 with restenosis CAD - nonobstructive at catheterization 2003, LVEF normal Hyperlipidemia Hypertension Chronic lower extremity edema Adenocarcinoma of right breast  Social History: Last updated: 03/16/2009 Married  Tobacco Use - No Alcohol Use - no  Review of Systems  The patient denies anorexia, fever, chest pain, syncope, dyspnea on exertion, peripheral edema, melena, and hematochezia.         Otherwise reviewed and negative.  Vital Signs:  Patient profile:   70 year old female Height:      63 inches Weight:      160 pounds Pulse rate:   78 / minute BP sitting:   120 / 75  (left arm) Cuff size:   large  Vitals Entered By: Carlye Grippe (August 19, 2009 1:56 PM)  Physical Exam  Additional Exam:  Normally nourished appearing woman, no acute distress. HEENT: Conjunctiva and lids normal, oropharynx clear. Neck: Supple, no elevated jugular venous pressure, soft right carotid bruit. Lungs: Clear to auscultation, nonlabored. Cardiac: Regular rate and rhythm, no S3. Abdomen: Soft, no bruits, bowel sounds present. Skin:  Warm and dry. Extremities: Trace ankle edema, distal pulses one plus.   Carotid Doppler  Procedure date:  08/01/2009  Findings:      Stable 40-59% RICA stenosis. Slight increase on left to 60-79% LICA stenosis.  Arterial Doppler  Procedure date:  08/01/2009  Findings:      Renal artery duplex showing normal right renal artery status post stent placement. There is 1-59% left renal artery stenosis status post stent placement.  Impression &  Recommendations:  Problem # 1:  ESSENTIAL HYPERTENSION, BENIGN (ICD-401.1)  Blood pressure well controlled today.  Her updated medication list for this problem includes:    Furosemide 20 Mg Tabs (Furosemide) .Marland Kitchen... Take 1/2 tablet by mouth once a day    Amlodipine Besylate 5 Mg Tabs (Amlodipine besylate) .Marland Kitchen... Take 1 tablet by mouth once a day    Aspirin 81 Mg Tabs (Aspirin) .Marland Kitchen... Take 1 tablet by mouth once a day    Diovan 160 Mg Tabs (Valsartan) .Marland Kitchen... 1 tablet by mouth two times a day  Problem # 2:  CAROTID ARTERY DISEASE (ICD-433.10)  Bilateral disease, detailed above with slight progression on the left. Continue aspirin and Plavix. Also needs aggressive LDL control if possible. She may need a different statin medication.  Her updated medication list for this problem includes:    Aspirin 81 Mg Tabs (Aspirin) .Marland Kitchen... Take 1 tablet by mouth once a day    Plavix 75 Mg Tabs (Clopidogrel bisulfate) .Marland Kitchen... Take 1 tablet by mouth once a day  Problem # 3:  HYPERLIPIDEMIA-MIXED (ICD-272.4)  LDL looked good back in March, however the patient is now off Crestor as detailed. Dr. Lysbeth Galas has been following her subsequent lipids. Suspect she may well need a different statin medication to achieve aggressive control.  The following medications were removed from the medication list:    Crestor 40 Mg Tabs (Rosuvastatin calcium) .Marland Kitchen... Take 1 tablet by mouth every night  Problem # 4:  RENAL ARTERY ATHEROSCLEROSIS (ICD-440.1)  Status post bilateral renal artery stent placement, stable as detailed above with some restenosis on the left. Creatinine has been normal.  Patient Instructions: 1)  Your physician wants you to follow-up in: 6 months. You will receive a reminder letter in the mail one-two months in advance. If you don't receive a letter, please call our office to schedule the follow-up appointment. 2)  Your physician recommends that you continue on your current medications as directed. Please refer  to the Current Medication list given to you today.

## 2010-02-21 NOTE — Miscellaneous (Signed)
Summary: Orders Update  Clinical Lists Changes  Orders: Added new Test order of Carotid Duplex (Carotid Duplex) - Signed 

## 2010-02-23 NOTE — Assessment & Plan Note (Signed)
Summary: 6 MO FU PER JAN REMINDER   Visit Type:  Follow-up Primary Provider:  Dr. Joette Catching   History of Present Illness: 70 year old woman presents for followup. She was seen in July 2011. Reports no exertional chest pain. Followup arterial and carotid Doppler studies are noted below from around the time of her last visit, overall stable. She continues on the medications outlined below.   She does report an episode of fast palpitations that occurred approximately one to 2 months ago. She has not had any recurrence since then. No syncope.  Preventive Screening-Counseling & Management  Alcohol-Tobacco     Smoking Status: quit     Year Quit: 2000  Current Medications (verified): 1)  Furosemide 20 Mg Tabs (Furosemide) .... Take 1/2 Tablet By Mouth Once A Day 2)  Amlodipine Besylate 5 Mg Tabs (Amlodipine Besylate) .... Take 1 Tablet By Mouth Once A Day 3)  Aspirin 81 Mg Tabs (Aspirin) .... Take 1 Tablet By Mouth Once A Day 4)  Diovan 160 Mg Tabs (Valsartan) .Marland Kitchen.. 1 Tablet By Mouth Two Times A Day 5)  Nexium 40 Mg Cpdr (Esomeprazole Magnesium) .... Take 1 Tablet By Mouth Once A Day 6)  Plavix 75 Mg Tabs (Clopidogrel Bisulfate) .... Take 1 Tablet By Mouth Once A Day 7)  Caltrate 600+d 600-400 Mg-Unit Tabs (Calcium Carbonate-Vitamin D) .... Take 1 Tablet By Mouth Once A Day 8)  Lexapro 10 Mg Tabs (Escitalopram Oxalate) .... Take 1 Tablet By Mouth Once A Day 9)  Meloxicam 7.5 Mg Tabs (Meloxicam) .... Take 1 Tablet By Mouth Two Times A Day 10)  Tylenol 325 Mg Tabs (Acetaminophen) .... As Needed 11)  Align  Caps (Probiotic Product) .... Take 1 Tablet By Mouth Once A Day 12)  Celebrex 200 Mg Caps (Celecoxib) .... Take 1 Tablet By Mouth Once A Day For 10 Days 13)  Doxycycline Hyclate 100 Mg Solr (Doxycycline Hyclate) .... Take 1 Tablet By Mouth Two Times A Day 14)  Fluticasone Propionate 50 Mcg/act Susp (Fluticasone Propionate) .... 2 Sprays/nostrils Daily  Allergies (verified): 1)  !  Pcn 2)  ! Codeine 3)  ! Vancomycin  Comments:  Nurse/Medical Assistant: The patient's medication list/bottles and allergies were reviewed with the patient and were updated in the Medication and Allergy Lists.  Past History:  Past Medical History: Last updated: 03/17/2009 Renal artery stenosis - BMS bilaterally 2003, PTCA bilaterally 2005 with restenosis CAD - nonobstructive at catheterization 2003, LVEF normal Hyperlipidemia Hypertension Chronic lower extremity edema Adenocarcinoma of right breast  Social History: Last updated: 03/16/2009 Married  Tobacco Use - No Alcohol Use - no  Review of Systems  The patient denies anorexia, fever, chest pain, syncope, dyspnea on exertion, peripheral edema, melena, and hematochezia.         Otherwise reviewed and negative.  Vital Signs:  Patient profile:   70 year old female Menstrual status:  postmenopausal Height:      63 inches Weight:      163 pounds Pulse rate:   64 / minute BP sitting:   150 / 73  (left arm) Cuff size:   regular  Vitals Entered By: Carlye Grippe (January 30, 2010 1:59 PM)  Physical Exam  Additional Exam:  Normally nourished appearing woman, no acute distress. HEENT: Conjunctiva and lids normal, oropharynx clear. Neck: Supple, no elevated jugular venous pressure, soft right carotid bruit. Lungs: Clear to auscultation, nonlabored. Cardiac: Regular rate and rhythm, no S3. Abdomen: Soft, no bruits, bowel sounds present. Skin: Warm and  dry. Extremities: Trace ankle edema, distal pulses one plus.   EKG  Procedure date:  01/30/2010  Findings:      Sinus rhythm at 74 beats per minutes with occasional PAC.  Prior Report Reviewed for Carotid Doppler:  Findings: 08/01/2009 Stable 40-59% RICA stenosis. Slight increase on left to 60-79% LICA stenosis.  Comments:    Prior Report Reviewed for Arterial Doppler:  Findings: 08/01/2009 Renal artery duplex showing normal right renal artery status  post stent placement. There is 1-59% left renal artery stenosis status post stent placement.  Comments:    Impression & Recommendations:  Problem # 1:  CAROTID ARTERY DISEASE (ICD-433.10)  As noted above, continue medical therapy, and plan followup Dopplers in approximately 6 months.  Her updated medication list for this problem includes:    Aspirin 81 Mg Tabs (Aspirin) .Marland Kitchen... Take 1 tablet by mouth once a day    Plavix 75 Mg Tabs (Clopidogrel bisulfate) .Marland Kitchen... Take 1 tablet by mouth once a day  Problem # 2:  RENAL ARTERY ATHEROSCLEROSIS (ICD-440.1)  Patent stent site on the right with mild to moderate disease on the left status post stent placement. Continue medical therapy.  Problem # 3:  ESSENTIAL HYPERTENSION, BENIGN (ICD-401.1)  Blood pressure up today. Discussed sodium restriction. Patient reports compliance with medications. She does have further room to advance Norvasc if needed.  Her updated medication list for this problem includes:    Furosemide 20 Mg Tabs (Furosemide) .Marland Kitchen... Take 1/2 tablet by mouth once a day    Amlodipine Besylate 5 Mg Tabs (Amlodipine besylate) .Marland Kitchen... Take 1 tablet by mouth once a day    Aspirin 81 Mg Tabs (Aspirin) .Marland Kitchen... Take 1 tablet by mouth once a day    Diovan 160 Mg Tabs (Valsartan) .Marland Kitchen... 1 tablet by mouth two times a day  Problem # 4:  PALPITATIONS (ICD-785.1)  Single episode as noted. No syncope. If this becomes a recurrent problem, can always place an event recorder.  Her updated medication list for this problem includes:    Amlodipine Besylate 5 Mg Tabs (Amlodipine besylate) .Marland Kitchen... Take 1 tablet by mouth once a day    Aspirin 81 Mg Tabs (Aspirin) .Marland Kitchen... Take 1 tablet by mouth once a day    Plavix 75 Mg Tabs (Clopidogrel bisulfate) .Marland Kitchen... Take 1 tablet by mouth once a day  Other Orders: EKG w/ Interpretation (93000)  Patient Instructions: 1)  Your physician recommends that you continue on your current medications as directed. Please refer  to the Current Medication list given to you today. 2)  Follow up in  6 months

## 2010-02-28 ENCOUNTER — Encounter: Payer: Self-pay | Admitting: Cardiology

## 2010-04-03 ENCOUNTER — Encounter: Payer: Self-pay | Admitting: *Deleted

## 2010-06-06 ENCOUNTER — Ambulatory Visit: Payer: Self-pay | Admitting: Internal Medicine

## 2010-06-06 NOTE — Assessment & Plan Note (Signed)
[facility]                          [facility] OFFICE NOTE   Tamara King, Tamara King                       MRN:          [id number]  DATE:09/03/2006                            DOB:          [birth year]    PRIMARY CARDIOLOGIST:  Jonelle Sidle, M.D.   REASON FOR VISIT:  Scheduled clinic followup here.  Please refer to PA-C  Natale Lay recent office note of July 24, for full details.   The patient now presents in followup after being referred for extensive  diagnostic workup for evaluation of near-syncope, bradycardia, and chest  pain.   In summary, the patient underwent low-level exercise adenosine stress  Cardiolite testing, during which time she was found to have a left  bundle branch block on baseline EKG, and during which she did not report  any chest pain.  Perfusion imaging demonstrated no definite evidence of  ischemia; calculated ejection fraction 73%.   A 2D echocardiogram notable for normal left ventricular function (55-  60%) with no segmental wall motion abnormalities; mild pulmonary  hypertension (46 mmHg).   The patient also had a 48 hour Holter monitor yielding transient  episodes of SVT (less than 6 beats) and no significant pauses.  Given  that the patient had reports of recent bradycardia, and in fact was  taken off of Toprol by Dr. Lysbeth Galas, this testing is notable for a low  heart rate of 48 at 4 a.m.  No significant pauses were noted.   Extensive blood work was also drawn all of which was entirely within  normal limits, including a TSH level.  A chest x-ray was also negative.   Clinically, the patient describes essentially a 1-year history of  exertion-induced anterior chest tightness/pressure, which resolves with  rest.  She does confirm, however, that she did not have any chest pain  during a recent low-level stress test.  However, she tells me that this  was much less strenuous than what she often times does at  home.  She  also has exertional dyspnea with moderate exertion, but denies any  recent acceleration of these symptoms.   All of the results of these studies were reviewed in full with the  patient.   CURRENT MEDICATIONS:  Unchanged from recent visit.   PHYSICAL EXAMINATION:  Blood pressure 134/73, pulse 82 and regular,  weight 162.6.  GENERAL:  A 70 year old female, moderately obese, sitting upright in no  distress.  HEENT:  Normocephalic, atraumatic.  NECK:  Palpable bilateral carotid pulses with bilateral bruits (right  greater than left); no JVD at 90 degrees.  LUNGS:  Clear to auscultation in all fields.  HEART:  Regular rate and rhythm (S1, S2).  Positive S4.  No significant  murmurs.  ABDOMEN:  Protuberant and nontender.  EXTREMITIES:  2+ bilateral pedal edema.  NEURO:  No focal deficit.   IMPRESSION:  1. Exertional chest discomfort/dyspnea.      a.     Question anginal equivalent.      b.     Recent normal low-level adenosine stress Cardiolite;       ejection  fraction 73%.  2. Nonobstructive coronary artery disease.      a.     Cardiac catheterization December 2003.  3. Peripheral vascular disease.      a.     Status post bilateral renal artery stenting, December 2003.      b.     Status post bilateral balloon angioplasty secondary to in-       stent restenosis, May 2005:  patent bilateral renal arteries by       ultrasonography, May 2008.  4. Mild pulmonary hypertension.  5. History of stroke.      a.     Status post right carotid endarterectomy in 2000.  6. Chronic lower extremity edema.  7. Intermittent left bundle branch block.  8. Status post bradycardia .      a.     Improved off Toprol.  9. Dyslipidemia.  10.Hypertension.   PLAN:  1. Following review of the patient's current symptoms with Dr.      Diona Browner, and in light of all of the recent diagnostic studies,      recommendation is to pursue an initial trial of conservative      management rather than  proceeding with repeat cardiac      catheterization.  Therefore, we will add Imdur 30 mg daily to the      current medication regimen to see if this eliminates the exertional      chest discomfort that the patient has been reporting.  We will plan      on having the patient return to the clinic for early followup and      close monitoring of these symptoms.  2. Otherwise continue current medication regimen, including      aspirin/Plavix indefinitely as      outlined by Dr. Randa Evens.  3. Provide prescription for p.r.n. nitroglycerin.     Rozell Searing, PA-C  Electronically Signed      Jonelle Sidle, MD  Electronically Signed   GS/MedQ  DD: 09/03/2006  DT: 09/04/2006  Job #: [id number]   cc:   Delaney Meigs, M.D.

## 2010-06-06 NOTE — Assessment & Plan Note (Signed)
[facility]                          [facility]ICE NOTE   VENESA, SEMIDEY                       MRN:          [id number]  DATE:08/15/2006                            DOB:          July 19, 1940    CARDIOLOGY CONSULTATION   PRIMARY CARE PHYSICIAN:  Delaney Meigs, M.D.   PRIMARY CARDIOLOGIST:  Jonelle Sidle, M.D.   REFERRING PHYSICIAN:  Delaney Meigs, M.D.   REASON FOR REFERRAL:  Near syncope, bradycardia and chest pain.   HISTORY OF PRESENT ILLNESS:  Ms. Stahl is a very pleasant 70 year old  female patient with a history of nonobstructive coronary disease by  catheterization in 2003, significant renal artery stenosis status post  bilateral renal artery stenting in April of 2006 by Dr. Samule Ohm, as well  as cerebrovascular disease status post right carotid endarterectomy who  has recently been followed by Dr. Lysbeth Galas for bradycardia and light  headedness/near syncope.  Her beta blocker dose has been titrated  downward and was eventually stopped about two to three weeks ago.  However, the patient continued to note light headedness and also  reported chest pain and was referred over today for further evaluation.   The patient tells me that she has been light headed for years now.  Over  the last two to three years she thinks it is a little bit worse. Her  fatigue has gotten worse as well.  This is when she first saw Dr. Lysbeth Galas  and started having her Toprol dose decreased.  She notes that she has a  history of breast cancer status post lumpectomy in 2006.  She underwent  radiation therapy for quite some time after that.  She has been weak and  fatigued since that time.  She has also noted dyspnea on exertion since  that time as well.  It has really not changed that much over the last  several months.  She did note chest pressure yesterday that radiated out  from her sternum to her bilateral shoulders.  She was doing some  housework at the time.  She has had chest pain for years but this was  clearly different from her past history of chest pain.  Yesterday she  did not report any shortness of breath associated with this but did note  some questionable nausea but no diaphoresis.  She denies any true  syncope.  She denies orthopnea or PND.  She does have some pedal edema  that is fairly chronic and seems to be stable.  In talking with her she  describes dyspnea on exertion at NYHA class 2B symptoms.  She notes that  discontinuing her Toprol has helped somewhat.  In the office today, at  Dr. Joyce Copa practice, her electrocardiogram revealed sinus rhythm with  a heart rate of 68.   MEDICATIONS:  1. Diovan/HCT 160/25 mg daily.  2. Amlodipine 10 mg daily.  3. Nexium 40 mg daily.  4. Crestor 40 mg daily.  5. Plavix 75 mg daily.  6. Aspirin 81 mg daily.  7. Calcium.  8. Lexapro 10 mg daily.  9. Celebrex 200  mg daily.  10.Tylenol.   ALLERGIES:  PENICILLIN and CODEINE.   SOCIAL HISTORY:  The patient is an ex-smoker.  She smoked for 46 years  at 1-1/2 packs a day.  She quit in 2000.   REVIEW OF SYSTEMS:  Please see HPI.  She denies any fevers, chills,  cough, hemoptysis.  The rest of the review of systems is negative.   PHYSICAL EXAMINATION:  GENERAL APPEARANCE:  She is a well-nourished,  well-developed female in no acute distress.  VITAL SIGNS:  Blood pressure 124/76, pulse 75, weight 160 pounds.  Oxygen saturation on room air is 95%.  HEENT: Normal.  NECK:  Without jugular venous distention.  Carotids with faint bilateral  bruits.  CARDIAC:  S1, S2, regular rate and rhythm.  LUNGS:  Clear to auscultation bilaterally without rales, rhonchi or  wheezing.  ABDOMEN:  Soft, nontender.  EXTREMITIES:  With trace to 1+ edema bilaterally.  Calves are soft and  nontender.  SKIN:  Warm and dry.  NEUROLOGICAL:  She is alert and oriented x3.  Cranial nerves II-XII are  grossly intact.   CLINICAL DATA:   Electrocardiogram from Dr.  Joyce Copa office today did  reveal sinus rhythm with a heart rate of 68, normal axis, poor R wave  progression, no significant change when compared to previous tracings.   IMPRESSION:  1. Near syncope.  2. Recent history of bradycardia.      a.     Now off beta blockers.  3. Chest pain.  4. Dyspnea on exertion.  5. Nonobstructive coronary artery disease.      a.     Cardiac catheterization in December, 2003:  30% and 20%       narrowing in the mid left anterior descending.  6. Renal vascular disease.      a.     Status post bilateral renal artery stenting in April of 2006       by Dr. Samule Ohm.      b.     Patent renal arteries in May of 2008.      c.     Needs annual followup.  7. Cerebrovascular disease.      a.     Status post right carotid endarterectomy.      b.     Needs annual followup in May, 2009.  8. Treated dyslipidemia.      a.     Followed by Dr. Lysbeth Galas.  9. Treated hypertension.   PLAN:  The patient presents to our office today for further evaluation  of near syncope, bradycardia and chest pain.  Her symptoms seem to be  somewhat orthostatic in nature and she has had some chronic light  headedness for years now.  We will check orthostatic vital signs on her  before she leaves the office.  If her blood pressure drops significantly  when she stands, I would suggest stopping her hydrochlorothiazide from  her Diovan.  She had a heart rate of 68 earlier today.  She is off of  her Toprol now.  She notes she has had significant weakness and dyspnea  on exertion since her radiation therapy for her breast cancer.   At this point in time I have recommended that we proceed with stress  Cardiolite testing.  She will walk on a treadmill and we will be able to  assess her chronotropic competence.  We will also place her on a 48 hour  Holter monitor to rule out significant brady-arrhythmia's.  She will  also be set up for a 2-D echocardiogram to further  assess her shortness  of breath.  We will check a chest x-ray PA and lateral today as well as  CMET, CBC and TSH.  Will make sure she has annual followup with carotid  Doppler's, renal arterial Doppler's in May of 2009 and then she will see  Dr. Excell Seltzer in our [place] office after that.   We will see her back in about two to three weeks.  If the above testing  is unrevealing and she has changes in COPD on her chest x-ray we may  need to pursue further pulmonary work up.   I did discuss the above assessment and plans today with Dr. Diona Browner who  agreed.      Tereso Newcomer, PA-C  Electronically Signed      Jonelle Sidle, MD  Electronically Signed   SW/MedQ  DD: 08/15/2006  DT: 08/16/2006  Job #: [id number]   cc:   Delaney Meigs, M.D.

## 2010-06-06 NOTE — Assessment & Plan Note (Signed)
[facility]                          [facility]   LONDAN, COPLEN                       MRN:          [id number]  DATE:01/05/2008                            DOB:          [birth year]    PRIMARY CARDIOLOGIST:  Jonelle Sidle, MD   REASON FOR VISIT:  Annual followup.   Ms. Sterba denies any interim development of exertional angina pectoris.  She has, however, noted somewhat more pronounced exertional dyspnea in  the recent past.  She has not smoked in 10 years.  She indicates that  her most recent lipid profile showed adequate control.   Dictation Ended At [employer].      Gene Serpe, PA-C       Jonelle Sidle, MD    GS/MedQ  DD: 01/05/2008  DT: 01/06/2008  Job #: [id number]   cc:   Delaney Meigs, M.D.

## 2010-06-06 NOTE — Assessment & Plan Note (Signed)
[facility]                          [facility]   NAME:Tamara King, Tamara King                       MRN:          [id number]  DATE:10/29/2006                            DOB:          [birth year]    CARDIOLOGIST:  Dr. Simona Huh   PRIMARY CARE PHYSICIAN:  Dr. Joette Catching   REASON FOR VISIT:  One month followup.   HISTORY OF PRESENT ILLNESS:  Tamara King is a 70 year old female patient,  who presents to the office today for followup.  Please see office visit  notes from August 15, 2006, as well as September 03, 2006, for complete  details.  The patient had originally been seen for symptoms of near-  syncope and was noted to be bradycardic, and her beta blocker was  discontinued.  She also had chest pain.  A Cardiolite study was normal.  An echocardiogram revealed normal LV function.  A 48 hour Holter monitor  revealed no significant pauses and a low heart rate of 48.  Blood work  was essentially normal.   When she saw Tamara King on September 03, 2006, she was complaining of some  chest tightness and pressure with exertion that resolved with rest.  She  was placed on Imdur 30 mg a day and brought back today for followup.   Since being seen, the patient has had rotator cuff surgery.  This was  apparently done 4 weeks ago.  Per her report, she had no complications.  She was placed under general anesthesia.  She notes to me today that she  is having a significant amount of pain with her right shoulder.  She  also is nauseated and dizzy from the pain medications that she has been  taking.  She never started on Imdur.  She tells me she does not thinks  she needs it.  She actually says her chest pain is better than when she  was seen in the office a month ago.   Upon further questioning, the patient notes that she is having more pain  with her shoulder than anything right now.  She is somewhat of a  difficult historian today.  She tells me that she is  not having any  chest pain at rest.  She is not having any significant dyspnea.  She  denies syncope.  She denies orthopnea or PND.  She does have some mild  pedal edema.   CURRENT MEDICATIONS:  1. Amlodipine 10 mg daily.  2. Nexium 40 mg daily.  3. Crestor 40 mg daily.  4. Plavix 75 mg daily.  5. Aspirin 80 mg daily.  6. Calcium with vitamin D.  7. Celebrex 200 mg daily.  8. Diovan 160 mg daily.  9. Imdur 30 mg daily.   PHYSICAL EXAM:  She is a well-nourished, well-developed female in no  distress.  Blood pressure 140/82, pulse 75, weight 156.6 pounds.  HEENT:  Normal.  NECK:  Without JVD at 90 degrees.  CARDIAC:  Normal S1 and S2.  Regular rate and rhythm.  LUNGS:  Clear to auscultation bilaterally.  ABDOMEN:  Soft, nontender.  EXTREMITIES:  Without pitting edema bilaterally.   Electrocardiogram reveals sinus rhythm with a heart rate of 73, normal  axis, interventricular conduction delay, poor R wave progression.  No  significant changes since previous tracings.   IMPRESSION:  1. Exertional chest discomfort - improved.      a.     Question anginal equivalent.      b.     Recent normal stress Cardiolite scan.  2. Nonobstructive coronary artery disease by catheterization 2003.  3. Good left ventricular function.  4. Recent history of bradycardia and near-syncope.      a.     Resolved off beta blockers.  5. Renovascular disease.      a.     Status post bilateral renal artery stenting April 2006.  6. Cardiovascular disease.      a.     Status post right carotid endarterectomy.  7. Dyslipidemia.  8. Hypertension.   PLAN:  The patient presents to the office today for followup.  As noted  above, she did not start on the Imdur.  She is having significant side  effects from the medications used to treat her pain from her recent  shoulder surgery.  She tells me that her chest pain is actually better  now than it was before.  At this point in time, we plan to:  1. I have  encouraged her to go ahead and start on Imdur 30 mg daily.  2. I have also explained to her that if her chest symptoms should      worsen over time or if she has any rest symptoms, she should return      to see [place] sooner.  If she has significant symptoms, she should go      to the emergency room.  3. We will continue her current medications as listed above.  4. I will bring her back in followup with Dr. Diona Browner in the next 4      months or sooner p.r.n.      Tereso Newcomer, PA-C  Electronically Signed      Jonelle Sidle, MD  Electronically Signed   SW/MedQ  DD: 10/29/2006  DT: 10/29/2006  Job #: [id number]   cc:   Delaney Meigs, M.D.

## 2010-06-06 NOTE — Progress Notes (Signed)
[place] [facility]                        PERIPHERAL VASCULAR OFFICE NOTE   NAME:GANNTosca, Pletz                       MRN:          [id number]  DATE:06/12/2006                            DOB:          1940/03/22    HISTORY OF PRESENT ILLNESS:  Ms. Farnell is a 70 year old lady status post  multiple procedures to both renal arteries for atherosclerotic renal  artery stenosis.  Most recently, I have placed drug-eluting stent in  both renal arteries in April 2006 after she had suffered recurrent  restenosis.  Duplex ultrasound this morning shows them to continue to be  patent.  Her blood pressure remained nicely controlled.   In addition, she tells me that her breast cancer is quiescent and  thought to be cured.  She continues to be tired, caring for her husband.  Her only complaint is of episodes of lightheadedness occurring after  standing for a long period of time.  She has not suffered any syncope or  falls.  Dr. Lysbeth Galas decreased both her beta blocker and her ARB in  response to this.   CURRENT MEDICATIONS:  1. Diovan HCT 160/25 once daily.  2. Crestor 40 mg daily.  3. Calcium 600 mg daily.  4. Lexapro 10 mg daily.  5. Toprol-XL 50 mg daily.  6. Celebrex 200 mg daily.  7. Aspirin 81 mg daily.  8. Stool softener.  9. Plavix 75 mg daily.  10.Nexium 40 mg daily.  11.Norvasc 10 mg daily.   PHYSICAL EXAMINATION:  She is generally well-appearing in no distress  with heart rate 70, blood pressure 118/72 on the left, and 136/74 and  the right.  She weighs 156 pounds.  She has no jugular venous distention, thyromegaly, or lymphadenopathy.  LUNGS:  Clear to auscultation.  She has a nondisplaced point of maximal impulse.  There is a regular  rate and rhythm without murmur, rub, or gallop.  Respiratory effort is normal.  ABDOMEN:  Soft, nondistended, nontender.  There is no abdominal bruit.  Bowel sounds are normal.  No pulsatile midline mass.  EXTREMITIES:   Warm without clubbing, cyanosis, edema or ulceration.  Carotid pulses are two plus bilaterally with a bruit and the right.  She is alert and oriented x3 with cranial nerves II-XII intact.  Strength and sensation is normal in all four extremities.   Renal artery duplex performed today demonstrates deep systolic velocity  on the right of 137 cm/second and on the left 215 cm/second consistent  with patent stents bilaterally.  Kidney sizes are stable compared with a  year ago.   IMPRESSION/RECOMMENDATIONS:  1. Renal artery stenosis, patent renal stents bilaterally.  Blood      pressure remained nicely controlled.  Check repeat duplex in a      year.  Continue aspirin and Plavix indefinitely since we have drug-      eluting stent in both renal arteries.  2. Carotid stenosis, asymptomatic.  Patent right endarterectomy site.      The left has mild aortic stenoses, due for repeat study in the      year.   Will have  for follow-up with Dr. Diona Browner who has been her primary  cardiologist.  She should continue to have yearly duplex ultrasound of  her renal arteries.  Risk of restenosis now two years out from stent  placement is very low.     Salvadore Farber, MD  Electronically Signed    WED/MedQ  DD: 06/12/2006  DT: 06/12/2006  Job #: [id number]

## 2010-06-06 NOTE — Assessment & Plan Note (Signed)
[facility]                          [facility]   Tamara King, Tamara King                       MRN:          [id number]  DATE:08/05/2008                            DOB:          10-Mar-1940    PRIMARY CARE PHYSICIAN:  Delaney Meigs, MD   REASON FOR VISIT:  Routine followup.   HISTORY OF PRESENT ILLNESS:  Ms. Ruggles was seen back in December 2009.  She is not reporting any significant problems with progressive  exertional angina.  She has NYHA class II dyspnea on exertion.  She  reports having a recent cold and has had general malaise, but this seems  to be improving.  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  A followup was recommended in 1 year.  She has had good  lipid control with labs done by Dr. Lysbeth Galas back in January demonstrating  an LDL of 66, HDL 79, triglycerides 87 and total cholesterol of 162.  AST and ALT were normal.  She does complain of intermittent lower  extremity edema.  She is on a very low-dose Lasix.   ALLERGIES:  PENICILLIN and CODEINE.   PRESENT MEDICATIONS:  1. Amlodipine 10 mg p.o. daily.  2. Nexium 40 mg p.o. daily.  3. Crestor 40 mg p.o. daily.  4. Plavix 75 mg p.o. daily.  5. Aspirin 81 mg p.o. daily.  6. Calcium with vitamin D 600 mg p.o. daily.  7. Diovan 160 mg p.o. daily.  8. Lexapro 10 mg p.o. daily.  9. Alendronate 70 mg weekly.  10.Meloxicam 7.5 mg p.o. b.i.d.  11.Lasix 5 mg p.o. daily.  12.Tylenol p.r.n.   REVIEW OF SYSTEMS:  As outlined above.  Otherwise reviewed negative.   PHYSICAL EXAMINATION:  VITAL SIGNS:  Blood pressure 162/87; heart rate  is 79 and regular; weight is 163 pounds,  which is stable.  GENERAL:  The patient is comfortable in no acute distress.  HEENT:  Conjunctivae are normal.  Oropharynx is clear.  NECK:  Supple.  No elevated jugular venous pressure.  No thyromegaly.  No loud carotid bruits.  LUNGS:  Clear without labored breathing.  CARDIAC:  Regular rate and rhythm.  Soft S4.  No loud systolic murmur.  No pericardial rub.  ABDOMEN:  Soft, nontender.  Normoactive bowel sounds.  No bruits.  EXTREMITIES:  Exhibit no significant pitting edema.  There is some mild  venous stasis.  Distal pulses are 1+.  SKIN:  Warm and dry.  MUSCULOSKELETAL:  No kyphosis noted.  NEUROPSYCHIATRIC:  The patient is alert and oriented x3.  Affect is  appropriate.   IMPRESSION AND RECOMMENDATIONS:  1. Cardiovascular disease, nonobstructive at catheterization in      December 2003.  She has normal left ventricular systolic function      and had a nonischemic Cardiolite  in 2008.  She is not reporting any      significant angina at this time.  I will plan to continue medical      therapy and have symptom observation in the next 6 months.  2. Intermittent lower extremity edema.  Possibly complicated by use of      amlodipine.  We talked about increasing her Lasix to 20 mg daily to      see if this helps.  A followup BMET will be obtained in 2 weeks.  3. Hyperlipidemia, well controlled on Crestor.  4. Peripheral arterial disease status post previous bilateral renal      artery stent placement, stable by recent ultrasound.     Jonelle Sidle, MD  Electronically Signed    SGM/MedQ  DD: 08/05/2008  DT: 08/06/2008  Job #: [id number]   cc:   Delaney Meigs, M.D.

## 2010-06-06 NOTE — Assessment & Plan Note (Signed)
[facility]                            CARDIOLOGY OFFICE NOTE   NAME:GANNKhamya, Topp                       MRN:          [id number]  DATE:06/18/2007                            DOB:          03-24-1940    PRIMARY CARE PHYSICIAN:  Dr. Joette Catching   REASON FOR VISIT:  Routine follow-up.   HISTORY OF PRESENT ILLNESS:  Ms. Burry was last seen in October back in  our [place] office.  History includes nonobstructive coronary  atherosclerosis with normal ejection fraction, hypertension,  hyperlipidemia, and peripheral arterial disease including carotids,  status post right carotid endarterectomy and also bilateral renal artery  stenosis status post drug-eluting stent placement in April 2006 by Dr.  Samule Ohm.  She came in the office today actually expecting a follow-up  renal duplex, although, this had not been scheduled.  She did eat  breakfast this morning.  Symptomatically, she denies any exertional  chest pain.  She does complain of fatigue and also leg pain.  She has  lower extremity edema which she has had before and was actually taking a  low-dose diuretic at one point.  Her electrocardiogram shows sinus  rhythm with decreased R wave progression which is old and QRS widening  at 114 milliseconds.  Lab work from September last year showed a  potassium of 4.1, BUN 22 and creatinine of 1.3.   ALLERGIES:  PENICILLIN, BALONEY, CODEINE AND FISH.   PRESENT MEDICATIONS:  1. Potassium 600 mg p.o. daily.  2. Lexapro 10 mg p.o. daily.  3. Celebrex 200 mg p.o. daily.  4. Enteric coated aspirin 81 mg p.o. daily.  5. Plavix 75 mg p.o. daily.  6. Nexium 40 mg p.o. daily.  7. Norvasc 10 mg p.o. daily.  8. Crestor 40 mg p.o. daily.  9. Diovan 160 mg p.o. daily.   REVIEW OF SYSTEMS:  As outlined above.  Otherwise negative.   PHYSICAL EXAMINATION:  VITAL SIGNS:  Blood pressure is 160/70, heart  rate 77, weight 157 pounds.  The patient is comfortable in no acute  distress.  HEENT:  Conjunctivae was normal.  Oropharynx clear.  NECK:  Supple.  No elevated jugular venous pressure, soft right carotid  bruit.  No thyromegaly.  LUNGS:  Clear, nonlabored breathing.  CARDIAC:  Regular rate and rhythm, soft systolic murmur.  No S3 gallop.  Second heart sound is normal.  ABDOMEN:  Soft, no bruits.  Bowel sounds present.  EXTREMITIES:  Exhibit venous varicosities, 1+ edema bilaterally,  symmetric, some venostasis.  MUSCULOSKELETAL:  No kyphosis noted.  NEUROPSYCHIATRIC:  The patient is alert and oriented x3.  Affect is  normal.   IMPRESSION AND RECOMMENDATIONS:  1. Nonobstructive coronary artery disease with normal ejection      fraction.  The patient is not reporting any angina.  This will be      followed expectantly with risk factor modification.  2. History of hyperlipidemia, on statin therapy.  This has been      followed by Dr. Lysbeth Galas.  Recommend LDL control around 70.  3. Hypertension, not as well controlled today.  Due  to this and lower      extremity edema, we will add hydrochlorothiazide back to her      regimen.  She will have a followup B-met over the next 2 weeks in      [place].  Otherwise, we will plan to see her back there over the next      6 months.  4. History of renal artery stenosis status post drug-eluting stent      placement by Dr. Samule Ohm in 2006.  She is due for a follow-up renal      artery Duplex scan which will be arranged.     Jonelle Sidle, MD  Electronically Signed    SGM/MedQ  DD: 06/18/2007  DT: 06/18/2007  Job #: [id number]   cc:   Delaney Meigs, M.D.

## 2010-06-06 NOTE — H&P (Signed)
Tamara King, Tamara King                ACCOUNT NO.:  [id number]   MEDICAL RECORD NO.:  [id number]          PATIENT TYPE:  AMB   LOCATION:  DAY                           FACILITY:  [facility]   PHYSICIAN:  J. Darreld Mclean, M.D. DATE OF BIRTH:  [birth year]   DATE OF ADMISSION:  DATE OF DISCHARGE:  LH                              HISTORY & PHYSICAL   CHIEF COMPLAINT:  My shoulder keeps hurting.   The patient is a 70 year old female with pain and tenderness in the  right shoulder that has been bothering her now for several years.  In  2003 an MRI showed a rotator cuff tendinosis and significant bursitis.  Shoulder pain got progressively worse.  She came back to the office to  see me in April 2008.  MRI at that time showed changes in the tendon  surface with fraying, thinning, and full thickness infraspinatus  conjoined tendon tear associated with volume loss of the infraspinatus  tendon.  She has __________ joints of the shoulder plus chronic  bursitis.  She has tried conservative therapy.  She has tried  injections.  She tried physical therapy.  All with no help.  She is  having pain and tenderness with most any motion and she desires surgery.   1. She has had a stroke in the past.  2. She has a history of hypertension.  3. She has a history of breast cancer on the right, August 2006.  4. She is status post stent in both renal arteries, December 2003.  5. Stent both renal arteries repeated on April 2006.  6. Hemorrhoid surgery, February 2004.  7. Diabetes.  8. Heart disease runs in the family.   ALLERGIES:  PENICILLIN.   MEDICATIONS:  1. Diovan HCT 160/25 daily.  2. Amlodipine daily.  3. Lexapro 10 mg daily.  4. Metoprolol 10 mg daily.  5. Nexium 40 mg daily.  6. Crestor 40 mg daily.  7. Plavix 75 mg daily.  8. Aspirin 81 mg daily.  9. Celebrex 200 mg daily.  10.Calcium.  11.Tylenol.   She does not smoke, denies alcoholic beverages.  The patient lives in  [place].   FAMILY  DOCTOR:  Delaney Meigs, M.D.   PHYSICAL EXAMINATION:  GENERAL:  The patient is alert, cooperative,  oriented.  VITAL SIGNS:  Her BP is 120/72, pulse 72, respirations 16, afebrile, 5  feet 2-3/4 inches, 161 pounds.  HEENT:  Negative.  NECK:  Supple.  LUNGS:  Clear to P&A.  HEART:  Regular without murmur heard.  ABDOMEN:  Soft and nontender without mass.  MUSCULOSKELETAL:  She has decreased range of motion of her shoulder.  Most any motion is very tender and painful for her but she can  eventually get her arm up in forward flexion of about 100, abduction to  around 75, internal rotation is nearly full, external rotation is about  5 degrees, extension 5 degrees, adduction is nearly full.  Neurovascular  is intact.  Other extremities are negative.  CNS:  Intact.  SKIN:  Intact.   IMPRESSION:  1. Rotator cuff tear right shoulder  infraspinatus.  2. Bursitis chronic of the right shoulder.   PLAN:  Open rotator cuff repair on the right.  I discussed with the  patient the planned procedure, risks, and imponderables.  She appears to  understand and agrees to the procedure as outlined.  Labs are pending.                                            ______________________________  J. Darreld Mclean, M.D.     JWK/MEDQ  D:  09/30/2006  T:  09/30/2006  Job:  [id number]

## 2010-06-06 NOTE — Op Note (Signed)
Tamara King, Tamara King                ACCOUNT NO.:  [id number]   MEDICAL RECORD NO.:  [id number]          PATIENT TYPE:  OBV   LOCATION:  A301                          FACILITY:  [facility]   PHYSICIAN:  J. Darreld Mclean, M.D. DATE OF BIRTH:  10-23-1940   DATE OF PROCEDURE:  10/01/2006  DATE OF DISCHARGE:                               OPERATIVE REPORT   PREOPERATIVE DIAGNOSIS:  Tear of rotator cuff on the right.   POSTOPERATIVE DIAGNOSIS:  Tear of rotator cuff on the right.   PROCEDURE:  Open repair right rotator cuff with a modified Neer  acromioplasty.  Repair of the tear of the rotator cuff using surgical  anchor.   ANESTHESIA:  General.   Minimal blood loss.   SURGEON:  J. Darreld Mclean, M.D.   INDICATIONS:  The patient is a 70 year old female with pain and  tenderness in her right shoulder and pain with rest and most any  activity.  She has not improved with conservative treatment.  Risks and  imponderables of the procedure discussed preoperatively.  She appeared  to understand and agreed to procedure as outlined.   DESCRIPTION OF PROCEDURE:  The patient seen in holding area.  The right  shoulder identified as correct surgical site.  I placed a mark the right  shoulder.  She placed a mark on the right shoulder.  She is brought to  the operating room given general anesthesia while supine.  She was then  placed in the semi barber position.  The arm was position so it could be  moved freely by the assistant.  The patient was prepped and draped in  the usual manner.  A time-out identifying [name] as the patient and  the right shoulder as the correct surgical site.  Incision was made  between the end of the acromion and the coracoid.  Careful dissection,  so-called weak spot in the deltoid was identified.  This was opened.  A  suture was placed 5 cm below the [facility] joint __________  possible injury to  the axillary nerve.  By careful dissection coracoacromial ligament was  identified,  tagged and then removed.  She has a spur off of the [facility] joint  area.  This was removed using the sharp osteotome and a modified Neer  acromioplasty and also using power rasp.  Rotator cuff tear was  identified, the biceps tendon was identified.  I was able to bring the  free edge of the infraspinatus area up and used a surgical anchor size  5.  And then using the sutures attached to the anchor I was able to  repair this and get a nice repair.  Shoulder could move freely.  There  was no apparent impingement. Deltoid was repaired using 2-0 chromic and  the subcutaneous tissue reapproximated in 2-0 plain,  running 3-0 nylon was used.  Steri-Strips applied.  Sterile dressing  applied.  Cryo cuff was applied.  Shoulder immobilizer applied.  The  patient tolerated procedure well.  She will be observed tonight.  Morphine PCA has been ordered.  ______________________________  Shela Commons. Darreld Mclean, M.D.     JWK/MEDQ  D:  10/01/2006  T:  10/01/2006  Job:  [id number]

## 2010-06-06 NOTE — Assessment & Plan Note (Signed)
[facility]                          [facility] OFFICE NOTE   ESTEPHANIA, LICCIARDI                       MRN:          [id number]  DATE:01/05/2008                            DOB:          Oct 27, 1940    PRIMARY CARDIOLOGIST:  Jonelle Sidle, MD   REASON FOR VISIT:  Annual followup.   Tamara King denies any interim development of exertional angina pectoris.  She has, however, noted somewhat more pronounced exertional dyspnea in  the recent past.  She has not smoked in 10 years.  She indicates that  her most recent lipid profile showed adequate control.   Ms. Politano informs me today that she actually never started the Imdur that  I had originally recommended back in August 2008.  At that time, we  recommended medical therapy versus a repeat cardiac catheterization.  However, I learned today that her symptoms subsequently improved on  their own, and that she actually never did start Imdur.   EKG today indicates NSR at 68 bpm with normal axis and no significant  changes since her previous study.  Of note, these are suggestive of  prior anteroseptal infarct.   CURRENT MEDICATIONS:  1. Aspirin 81 daily.  2. Plavix.  3. Crestor 40 daily.  4. Nexium.  5. Amlodipine 10 daily.  6. Diovan 160 daily.  7. Lexapro.  8. Furosemide 10 daily.  9. Meloxicam.   PHYSICAL EXAMINATION:  VITAL SIGNS:  Blood pressure 148/78, pulse 73,  weight 162.  GENERAL:  A 70 year old female sitting upright in no distress.  HEENT:  Normocephalic, atraumatic.  NECK:  Palpable bilateral carotid pulses with faint right carotid bruit;  no JVD at 90 degrees.  LUNGS:  Clear to auscultation in all fields.  HEART:  Regular rate and rhythm.  Positive S4.  No significant murmurs.  ABDOMEN:  Soft, intact bowel sounds.  EXTREMITIES:  Palpable dorsalis pedis pulses with 2+, nonpitting edema.  NEUROLOGIC:  No focal deficits.   IMPRESSION:  1. Exertional dyspnea.      a.      History of normal LVF.      b.     Negative adenosine stress Cardiolite; EF 73%, August 2008.  2. Nonobstructive CAD.      a.     Cardiac catheterization, December 2003.  3. Severe peripheral arterial disease.      a.     Status post bilateral renal artery stenting, December 2003.      b.     Status post bilateral PTCA secondary to in-stent restenosis,       May 2005.      c.     Patent bilateral renal arteries by ultrasonography, May       2008.  4. History of stroke.      a.     Status post right CEA in 2000.  5. Chronic lower extremity edema.  6. Intermittent left bundle-branch block.  7. Dyslipidemia.  8. Mild pulmonary hypertension.  9. History of bradycardia, on Toprol.  10.Hypertension.   PLAN:  1. 2-D echocardiogram for reassessment of left ventricular function.  If this indicates any significant change from her previous study,      then we will need to consider either repeat cardiac catheterization      or, if the patient declines, a repeat stress test.  2. Request most recent lipid profile from Dr. Joyce Copa office.      Recommend continued aggressive risk factor modification, with      target LDL of 70 or less, if possible.  3. Return clinic followup with myself and Dr. Diona Browner in 6 months, or      sooner if needed.      Rozell Searing, PA-C  Electronically Signed      Jonelle Sidle, MD  Electronically Signed   GS/MedQ  DD: 01/05/2008  DT: 01/06/2008  Job #: [id number]   cc:   Delaney Meigs, M.D.

## 2010-06-07 ENCOUNTER — Ambulatory Visit: Payer: Self-pay | Admitting: Internal Medicine

## 2010-06-09 NOTE — Cardiovascular Report (Signed)
NAME:  Tamara King, Tamara King                          ACCOUNT NO.:  [id number]   MEDICAL RECORD NO.:  [id number]                   PATIENT TYPE:  OIB   LOCATION:  2859                                 FACILITY:  [facility]   PHYSICIAN:  Salvadore Farber, M.D. [facility]         DATE OF BIRTH:  [birth year]   DATE OF PROCEDURE:  06/18/2003  DATE OF DISCHARGE:                              CARDIAC CATHETERIZATION   PROCEDURES PERFORMED  1. Bilateral renal angiography.  2. Bilateral balloon angioplasty for in-stent restenosis of the renal     arteries.    CARDIOLOGIST:  Salvadore Farber, M.D.   INDICATIONS:  Ms. Marszalek is a 70 year old LAD status post renal artery  stenting in December 2003 bilaterally.  Blood pressure was initially very  well controlled requiring cessation of all antihypertensives; however, it  has recurred.  Ultrasound suggested bilateral restenosis.  She was therefore  scheduled for angiography with an eye to revascularization.   PROCEDURAL TECHNIQUE:  Informed consent was obtained.  Under 1% lidocaine  local anesthesia a 6 French sheath was placed in the right common femoral  artery using the modified Seldinger technique.  A pigtail catheter was  advanced to the suprarenal abdominal aorta.  Aortography was performed by  power injection.  This confirmed severe in-stent restenosis of both renal  arteries.  I thus proceeded with intervention.   Anticoagulation was initiated with heparin to achieve and maintain an ACT of  greater than 250 seconds.  A 6 French LIMA guide was advanced over a wire  and used to selectively engage first the right renal artery.  A stabilizer  wire was advanced into the renal vasculature.  The segment of in-stent  restenosis was dilated using a 5.0 x 15 mm Aviator for two sequential  inflations at 12 atmospheres.  Final angiogram demonstrated no residual  stenosis, no dissection and normal flow to the renal parenchyma.   Attention was then turned to the left  renal.  There was approximately 70% in-  stent restenosis.  The LIMA guide was engaged in this selectively and  angiography performed by hand injection.  The stabilizer wire was advanced  through the stent into the distal renal vasculature.  It was then dilated to  eight and subsequently 10 atmospheres.  Final angiogram demonstrated less  than 10% residual stenosis.   The patient tolerated the procedure well and was transferred to the holding  room in stable condition.   COMPLICATIONS:  None.   FINDINGS:  1. Aorta:  Diffuse disease at the infrarenal abdominal aorta without focal     stenosis.  There are mild focal stenoses of the proximal portion of both     common iliac arteries.  These are no flow limiting.   1. Left Renal Artery:  Seventy percent in-stent restenosis treated with     balloon angioplasty to less than 10% residual.  The left renal size is     normal  and parenchyma opacifies normally.   1. Right Renal:  Ninety percent in-stent restenosis treated with balloon     angioplasty to less than 10% residual.  The right kidney is small, but     opacifies normally.   IMPRESSION AND PLAN:  1. The patient will be admitted to observation.  2. All antihypertensives will be held given a profound improvement in her     blood pressure with the initial stenting.   Renal function and blood pressure will be closely observed in the hours and  days to come.  We will plan renal artery ultrasound within the next month to  establish a baseline.  We will then repeat it at six months.                                               Salvadore Farber, M.D. [facility]    WED/MEDQ  D:  06/18/2003  T:  06/19/2003  Job:  [id number]   cc:   Colon Flattery, D.O.  [address]  [place]  [place] [address]  Fax: [phone]   Jonelle Sidle, M.D. [facility]

## 2010-06-09 NOTE — Discharge Summary (Signed)
NAMESARIT, SPARANO NO.:  [id number]   MEDICAL RECORD NO.:  [id number]          PATIENT TYPE:  OIB   LOCATION:  5707                         FACILITY:  [facility]   PHYSICIAN:  Olga Millers, M.D. LHCDATE OF BIRTH:  [birth year]   DATE OF ADMISSION:  05/12/2004  DATE OF DISCHARGE:  05/13/2004                           DISCHARGE SUMMARY - REFERRING   PRIMARY CARDIOLOGIST:  Dr. Simona Huh.   PRIMARY CARE PHYSICIAN:  Dr. Colon Flattery in [place].   HISTORY:  Tamara King is a 70 year old lady who is status post stenting of  bilateral renal arteries in December of 2003, balloon angioplasty for  bilateral in-stent restenosis in May of 2005.  Since that time her blood  pressure has continued to fluctuate despite being compliant with four  medications.  Renal artery ultrasound was performed on April 07, 2004, and  suggests restenosis bilaterally.  Kidney sizes were unchanged compared to  previous.  Patient was recommended to have drug eluting stent placement to  her bilateral renal arteries back in March of 2006, she deferred for  approximately one month secondary to her husband's illness.  On May 12, 2004, there were no changes in her symptoms or exam and she was scheduled  for her planned renal angiogram and possible placement of drug eluting  stents.  Dr. Samule Ohm performed this on May 12, 2004.  She was found to have  80% right renal artery in-stent restenosis and 90% in-stent restenosis of  the left renal artery.  Patient underwent drug eluting stent to both right  renal artery and left renal artery.   Patient has done well post procedure with no complaints this a.m.  Patient  was interviewed and examined by Dr. Olga Millers, who felt that she was  stable for discharge today.  It was recommended that she discontinue her  Norvasc and Diovan HCT.  She will continue Plavix for a minimum of one year.   LABORATORY DATA:  CBC is within normal limits.  Basic  metabolic panel is  within normal limits with the exception of mildly decreased potassium at  3.3.  Patient was given 40 mEq p.o. x1 of K-Dur.   RADIOLOGY:  Chest x-ray revealed no active disease.   Electrocardiogram revealed sinus rhythm at 58 beats per minute with poor R-  wave progression, nonspecific ST abnormalities, normal interval.   DISCHARGE INSTRUCTIONS:  1.  Discharge medications:      1.  Aspirin 81 mg daily.      2.  Plavix 75 mg daily which is new.      3.  Nexium 40 mg daily.      4.  Multivitamin daily.      5.  Activella as prior to admission.      6.  Crestor 20 mg daily.      7.  Toprol 50 mg daily.      8.  She is instructed not to take Norvasc or Diovan HCT.  2.  She is advised no lifting greater than five pounds x5 days.  3.  She is instructed that she  may shower but no baths x1 week.  4.  She is instructed on a low fat, low cholesterol diet.  5.  She is to report any groin pain, swelling, bleeding or fever to Dr.      Samule Ohm.  6.  She will have a follow-up appointment with Dr. Samule Ohm in two weeks.      Office will call her for an appointment.   DISCHARGE DIAGNOSES:  1.  Bilateral renal artery in-stent stenosis status post drug eluting stents      bilaterally.  2.  Hypertension.  3.  Nonobstructive coronary artery disease with a normal ejection fraction.  4.  Dyslipidemia.      AB/MEDQ  D:  05/13/2004  T:  05/14/2004  Job:  [id number]   cc:   Jonelle Sidle, M.D. [facility]   Colon Flattery, D.O.

## 2010-07-31 ENCOUNTER — Encounter: Payer: Self-pay | Admitting: Cardiology

## 2010-08-15 ENCOUNTER — Other Ambulatory Visit: Payer: Self-pay | Admitting: Cardiology

## 2010-08-15 DIAGNOSIS — I739 Peripheral vascular disease, unspecified: Secondary | ICD-10-CM

## 2010-08-15 DIAGNOSIS — I6529 Occlusion and stenosis of unspecified carotid artery: Secondary | ICD-10-CM

## 2010-08-16 ENCOUNTER — Encounter (INDEPENDENT_AMBULATORY_CARE_PROVIDER_SITE_OTHER): Payer: Medicare Other | Admitting: Cardiology

## 2010-08-16 DIAGNOSIS — I739 Peripheral vascular disease, unspecified: Secondary | ICD-10-CM

## 2010-08-16 DIAGNOSIS — I701 Atherosclerosis of renal artery: Secondary | ICD-10-CM

## 2010-08-16 DIAGNOSIS — I6529 Occlusion and stenosis of unspecified carotid artery: Secondary | ICD-10-CM

## 2010-08-21 ENCOUNTER — Encounter: Payer: Self-pay | Admitting: Cardiology

## 2010-09-30 ENCOUNTER — Other Ambulatory Visit: Payer: Self-pay | Admitting: Cardiology

## 2010-11-03 LAB — URINALYSIS, ROUTINE W REFLEX MICROSCOPIC
Nitrite: NEGATIVE
Specific Gravity, Urine: 1.025
Urobilinogen, UA: 1
pH: 6

## 2010-11-03 LAB — COMPREHENSIVE METABOLIC PANEL
AST: 22
Albumin: 3.9
BUN: 22
Calcium: 9.2
Chloride: 105
Creatinine, Ser: 1.33 — ABNORMAL HIGH
GFR calc Af Amer: 49 — ABNORMAL LOW
Total Bilirubin: 0.7
Total Protein: 6.3

## 2010-11-03 LAB — DIFFERENTIAL
Basophils Absolute: 0
Basophils Relative: 0
Lymphocytes Relative: 27
Neutro Abs: 4

## 2010-11-03 LAB — CBC
HCT: 36.7
MCV: 89.8
Platelets: 209
RDW: 12.1
WBC: 6.3

## 2011-01-26 ENCOUNTER — Ambulatory Visit (INDEPENDENT_AMBULATORY_CARE_PROVIDER_SITE_OTHER): Payer: Medicare Other | Admitting: Cardiology

## 2011-01-26 ENCOUNTER — Encounter: Payer: Self-pay | Admitting: Cardiology

## 2011-01-26 VITALS — BP 169/86 | HR 69 | Ht 63.0 in | Wt 167.0 lb

## 2011-01-26 DIAGNOSIS — E785 Hyperlipidemia, unspecified: Secondary | ICD-10-CM

## 2011-01-26 DIAGNOSIS — I701 Atherosclerosis of renal artery: Secondary | ICD-10-CM

## 2011-01-26 DIAGNOSIS — I10 Essential (primary) hypertension: Secondary | ICD-10-CM

## 2011-01-26 DIAGNOSIS — R609 Edema, unspecified: Secondary | ICD-10-CM

## 2011-01-26 DIAGNOSIS — I2510 Atherosclerotic heart disease of native coronary artery without angina pectoris: Secondary | ICD-10-CM

## 2011-01-26 DIAGNOSIS — I6529 Occlusion and stenosis of unspecified carotid artery: Secondary | ICD-10-CM

## 2011-01-26 MED ORDER — FUROSEMIDE 40 MG PO TABS: 40.0000 mg | ORAL_TABLET | Freq: Every day | ORAL | Status: DC

## 2011-01-26 NOTE — Assessment & Plan Note (Signed)
Nonobstructive, for followup carotid Dopplers in July of this year.

## 2011-01-26 NOTE — Assessment & Plan Note (Signed)
Chronic, also looks to have superficial phlebitis of the right lower leg. I asked her to use warm compresses, temporary anti-inflammatories, elevate the leg. She should see Dr. Lysbeth Galas as scheduled. We will also increase her Lasix to 40 mg daily.

## 2011-01-26 NOTE — Assessment & Plan Note (Signed)
Stable, followup renal Dopplers in July. Needs BMET with her routine blood work and Dr. Joyce Copa visit.

## 2011-01-26 NOTE — Assessment & Plan Note (Signed)
Should be getting followup lipid panel with Dr. Lysbeth Galas soon. Recommend resuming statin therapy.

## 2011-01-26 NOTE — Assessment & Plan Note (Signed)
Nonobstructive, no active angina. 

## 2011-01-26 NOTE — Progress Notes (Signed)
   Clinical Summary Ms. Dunsworth is a 71 y.o.female presenting for followup. She was seen in January of last year.  Renal Dopplers from July 2012 showed normal right renal artery status post stent with 1-59% left renal artery stenosis status post stent.  Carotid Dopplers done at the same time showed 0-39% RICA and 40-59% LICA, moderate bilateral subclavian stenosis. Followup arranged for one year.  She reports no chest pain or palpitations. No focal motor weakness or speech deficits.  She reports compliance with her medications, although states that she has been off Norvasc for some time now. States that she is due to see Dr. Lysbeth Galas soon with followup blood work including lipids. She is not on statin therapy.  Also complains of right lower leg pain in an area of venous varicosity and probable superficial phlebitis. Has not had any calf pain or definite injury in the area. Has chronic lower exam the edema.  Allergies and medication list reviewed.  Past Medical History  Diagnosis Date  . Renal artery stenosis     BMS bilateally 2003, PTCA bilaterally 2005 with restenosis  . Coronary atherosclerosis of native coronary artery     Nonobstructive at catheterization 2003, LVEF normal   . Mixed hyperlipidemia   . Essential hypertension, benign   . Chronic edema   . Breast cancer     Adenocarcinoma    Past Surgical History  Procedure Date  . Hemorrhoid surgery   . Rotator cuff repair 2008  . Right breast lumpectomy     Social History Ms. Antonetti reports that she quit smoking about 13 years ago. Her smoking use included Cigarettes. She has a 35 pack-year smoking history. She has never used smokeless tobacco. Ms. Babel reports that she does not drink alcohol.  Review of Systems Negative except as outlined above.  Physical Examination Filed Vitals:   01/26/11 1250  BP: 169/86  Pulse: 69    Normally nourished appearing woman, no acute distress.  HEENT: Conjunctiva and lids normal,  oropharynx clear.  Neck: Supple, no elevated jugular venous pressure, soft right carotid bruit.  Lungs: Clear to auscultation, nonlabored.  Cardiac: Regular rate and rhythm, no S3.  Abdomen: Soft, no bruits, bowel sounds present.  Skin: Warm and dry.  Extremities: 1+ lower leg edema, distal pulses one plus. Alla Feeling is here constantly with some tenderness, no major erythema the right lower leg. No cord or Homans sign.   ECG Electronic copy reviewed.   Problem List and Plan

## 2011-01-26 NOTE — Patient Instructions (Signed)
Your physician wants you to follow-up in: 6 months. You will receive a reminder letter in the mail one-two months in advance. If you don't receive a letter, please call our office to schedule the follow-up appointment. Increase Lasix (furosemide) to 40 mg daily. You make take 2 of your 20 mg tablets until gone and then get new prescription filled for 40 mg tablets and take 1 tablet daily. A new prescription was sent to your pharmacy to reflect this change.  Follow up with Dr. Lysbeth Galas as planned.

## 2011-01-26 NOTE — Assessment & Plan Note (Signed)
Blood pressure is elevated. She states that it has been "up and down." She is not taking Norvasc. Asked her to make sure she follows up with Dr. Lysbeth Galas for repeat lab work and clinical assessment. May need to resume Norvasc beginning at 2.5 mg daily.

## 2011-03-20 ENCOUNTER — Encounter (HOSPITAL_COMMUNITY): Payer: Self-pay | Admitting: Pharmacy Technician

## 2011-03-22 ENCOUNTER — Encounter (HOSPITAL_COMMUNITY)
Admission: RE | Admit: 2011-03-22 | Discharge: 2011-03-22 | Disposition: A | Discharge status: Home / Self Care | Payer: Medicare Other | Source: Ambulatory Visit | Attending: Ophthalmology | Admitting: Ophthalmology

## 2011-03-22 ENCOUNTER — Encounter (HOSPITAL_COMMUNITY): Payer: Self-pay

## 2011-03-22 HISTORY — DX: Cerebral infarction, unspecified: I63.9

## 2011-03-22 HISTORY — DX: Adverse effect of unspecified anesthetic, initial encounter: T41.45XA

## 2011-03-22 HISTORY — DX: Other specified postprocedural states: Z98.890

## 2011-03-22 HISTORY — DX: Sleep apnea, unspecified: G47.30

## 2011-03-22 HISTORY — DX: Nausea with vomiting, unspecified: R11.2

## 2011-03-22 LAB — BASIC METABOLIC PANEL
Calcium: 10.7 mg/dL — ABNORMAL HIGH (ref 8.4–10.5)
Creatinine, Ser: 1.07 mg/dL (ref 0.50–1.10)
GFR calc non Af Amer: 52 mL/min — ABNORMAL LOW (ref >90)
Glucose, Bld: 87 mg/dL (ref 70–99)
Sodium: 140 mEq/L (ref 135–145)

## 2011-03-22 LAB — HEMOGLOBIN AND HEMATOCRIT, BLOOD: HCT: 42.4 % (ref 36.0–46.0)

## 2011-03-22 NOTE — Patient Instructions (Signed)
[address] MARCHETA HORSEY  03/22/2011   Your procedure is scheduled on:  03/29/11  Report to [facility] at 07:20 AM.  Call this number if you have problems the morning of surgery: [phone]   Remember:   Do not eat food:After Midnight.  May have clear liquids:until Midnight .  Clear liquids include soda, tea, black coffee, apple or grape juice, broth.  Take these medicines the morning of surgery with A SIP OF WATER: Celebrex, Lexapro, Nexium, Evista and Valsartan. Also, use your inhaler, Fluticasone before you come in.   Do not wear jewelry, make-up or nail polish.  Do not wear lotions, powders, or perfumes. You may wear deodorant.  Do not shave 48 hours prior to surgery.  Do not bring valuables to the hospital.  Contacts, dentures or bridgework may not be worn into surgery.  Leave suitcase in the car. After surgery it may be brought to your room.  For patients admitted to the hospital, checkout time is 11:00 AM the day of discharge.   Patients discharged the day of surgery will not be allowed to drive home.  Name and phone number of your driver:   Special Instructions: N/A   Please read over the following fact sheets that you were given: Anesthesia Post-op Instructions and Care and Recovery After Surgery    Cataract A cataract is a clouding of the lens of the eye. It is most often related to aging. A cataract is not a "film" over the surface of the eye. The lens is inside the eye and changes size of the pupil. The lens can enlarge to let more light enter the eye in dark environments and contract the size of the pupil to let in bright light. The lens is the part of the eye that helps focus light on the retina. The retina is the eye's light-sensitive layer. It is in the back of the eye that sends visual signals to the brain. In a normal eye, light passes through the lens and gets focused on the retina. To help produce a sharp image, the lens must remain clear. When a lens becomes cloudy, vision is  compromised by the degree and nature of the clouding. Certain cataracts make people more near-sighted as they develop, others increase glare, and all reduce vision to some degree or another. A cataract that is so dense that it becomes milky Gopal Malter and a Ohn Bostic opacity can be seen through the pupil. When the Donnavan Covault color is seen, it is called a "mature" or "hyper-mature cataract." Such cataracts cause total blindness in the affected eye. The cataract must be removed to prevent damage to the eye itself. Some types of cataracts can cause a secondary disease of the eye, such as certain types of glaucoma. In the early stages, better lighting and eyeglasses may lessen vision problems caused by cataracts. At a certain point, surgery may be needed to improve vision. CAUSES   Aging. However, cataracts may occur at any age, even in newborns.   Certain drugs.   Trauma to the eye.   Certain diseases (such as diabetes).   Inherited or acquired medical syndromes.  SYMPTOMS   Gradual, progressive drop in vision in the affected eye. Cataracts may develop at different rates in each eye. Cataracts may even be in just one eye with the other unaffected.   Cataracts due to trauma may develop quickly, sometimes over a matter or days or even hours. The result is severe and rapid visual loss.  DIAGNOSIS  To  detect a cataract, an eye doctor examines the lens. A well developed cataract can be diagnosed without dilating the pupil. Early cataracts and others of a specific nature are best diagnosed with an exam of the eyes with the pupils dilated by drops. TREATMENT   For an early cataract, vision may improve by using different eyeglasses or stronger lighting.   If the above measures do not help, surgery is the only effective treatment. This treatment removes the cloudy lens and replaces it with a substitute lens (Intraocular lens, or IOL). Newly developed IOL technology allows the implanted lens to improve vision both at a  distance and up close. Discuss with your eye surgeon about the possibility of still needing glasses. Also discuss how visual coordination between both eyes will be affected.  A cataract needs to be removed only when vision loss interferes with your everyday activities such as driving, reading or watching TV. You and your eye doctor can make that decision together. In most cases, waiting until you are ready to have cataract surgery will not harm your eye. If you have cataracts in both eyes, only one should be removed at a time. This allows the operated eye to heal and be out of danger from serious problems (such as infection or poor wound healing) before having the other eye undergo surgery.  Sometimes, a cataract should be removed even if it does not cause problems with your vision. For example, a cataract should be removed if it prevents examination or treatment of another eye problem. Just as you cannot see out of the affected eye well, your doctor cannot see into your eye well through a cataract. The vast majority of people who have cataract surgery have better vision afterward. CATARACT REMOVAL There are two primary ways to remove a cataract. Your doctor can explain the differences and help determine which is best for you:  Phacoemulsification (small incision cataract surgery). This involves making a small cut (incision) on the edge of the clear, dome-shaped surface that covers the front of the eye (the cornea). An injection behind the eye or eye drops are given to make this a painless procedure. The doctor then inserts a tiny probe into the eye. This device emits ultrasound waves that soften and break up the cloudy center of the lens so it can be removed by suction. Most cataract surgery is done this way. The cuts are usually so small and performed in such a manner that often no sutures are needed to keep it closed.   Extracapsular surgery. Your doctor makes a slightly longer incision on the side of  the cornea. The doctor removes the hard center of the lens. The remainder of the lens is then removed by suction. In some cases, extremely fine sutures are needed which the doctor may, or may not remove in the office after the surgery.  When an IOL is implanted, it needs no care. It becomes a permanent part of your eye and cannot be seen or felt.  Some people cannot have an IOL. They may have problems during surgery, or maybe they have another eye disease. For these people, a soft contact lens may be suggested. If an IOL or contact lens cannot be used, very powerful and thick glasses are required after surgery. Since vision is very different through such thick glasses, it is important to have your doctor discuss the impact on your vision after any cataract surgery where there is no plan to implant an IOL. The normal lens of  the eye is covered by a clear capsule. Both phacoemulsification and extracapsular surgery require that the back surface of this lens capsule be left in place. This helps support IOLs and prevents the IOL from dislocating and falling back into the deeper interior of the eye. Right after surgery, and often permanently this "posterior capsule" remains clear. In some cases however, it can become cloudy, presenting the same type of visual compromise that the original cataract did since light is again obstructed as it passes through the clear IOL. This condition is often referred to as an "after-cataract." Fortunately, after-cataracts are easily treated using a painless and very fast laser treatment that is performed without anesthesia or incisions. It is done in a matter of minutes in an outpatient environment. Visual improvement is often immediate.  HOME CARE INSTRUCTIONS   Your surgeon will discuss pre and post operative care with you prior to surgery. The majority of people are able to do almost all normal activities right away. Although, it is often advised to avoid strenuous activity for a  period of time.   Postoperative drops and careful avoidance of infection will be needed. Many surgeons suggest the use of a protective shield during the first few days after surgery.   There is a very small incidence of complication from modern cataract surgery, but it can happen. Infection that spreads to the inside of the eye (endophthalmitis) can result in total visual loss and even loss of the eye itself. In extremely rare instances, the inflammation of endophthalmitis can spread to both eyes (sympathetic ophthalmia). Appropriate post-operative care under the close observation of your surgeon is essential to a successful outcome.  SEEK IMMEDIATE MEDICAL CARE IF:   You have any sudden drop of vision in the operated eye.   You have pain in the operated eye.   You see a large number of floating dots in the field of vision in the operated eye.   You see flashing lights, or if a portion of your side vision in any direction appears black (like a curtain being drawn into your field of vision) in the operated eye.  Document Released: 01/08/2005 Document Revised: 09/20/2010 Document Reviewed: 02/24/2007 [facility] Patient Information 2012 [place], [place].   PATIENT INSTRUCTIONS POST-ANESTHESIA  IMMEDIATELY FOLLOWING SURGERY:  Do not drive or operate machinery for the first twenty four hours after surgery.  Do not make any important decisions for twenty four hours after surgery or while taking narcotic pain medications or sedatives.  If you develop intractable nausea and vomiting or a severe headache please notify your doctor immediately.  FOLLOW-UP:  Please make an appointment with your surgeon as instructed. You do not need to follow up with anesthesia unless specifically instructed to do so.  WOUND CARE INSTRUCTIONS (if applicable):  Keep a dry clean dressing on the anesthesia/puncture wound site if there is drainage.  Once the wound has quit draining you may leave it open to air.  Generally you  should leave the bandage intact for twenty four hours unless there is drainage.  If the epidural site drains for more than 36-48 hours please call the anesthesia department.  QUESTIONS?:  Please feel free to call your physician or the hospital operator if you have any questions, and they will be happy to assist you.     [facility] Anesthesia Department [address] [place] [phone]

## 2011-03-22 NOTE — Progress Notes (Signed)
03/22/11 1412  OBSTRUCTIVE SLEEP APNEA  Have you ever been diagnosed with sleep apnea through a sleep study? No  Do you snore loudly (loud enough to be heard through closed doors)?  1  Do you often feel tired, fatigued, or sleepy during the daytime? 1  Has anyone observed you stop breathing during your sleep? 0  Do you have, or are you being treated for high blood pressure? 1  BMI more than 35 kg/m2? 0  Age over 69 years old? 1  Neck circumference greater than 40 cm/18 inches? 0  Gender: 0  Obstructive Sleep Apnea Score 4   Score 4 or greater  Updated health history

## 2011-03-28 MED ORDER — TETRACAINE HCL 0.5 % OP SOLN
OPHTHALMIC | Status: AC
  Administered 2011-03-29: 1 [drp] via OPHTHALMIC
  Filled 2011-03-28: qty 2

## 2011-03-28 MED ORDER — NEOMYCIN-POLYMYXIN-DEXAMETH 3.5-10000-0.1 OP OINT
TOPICAL_OINTMENT | OPHTHALMIC | Status: AC
  Filled 2011-03-28: qty 3.5

## 2011-03-28 MED ORDER — CYCLOPENTOLATE-PHENYLEPHRINE 0.2-1 % OP SOLN
OPHTHALMIC | Status: AC
  Administered 2011-03-29: 1 [drp] via OPHTHALMIC
  Filled 2011-03-28: qty 2

## 2011-03-28 MED ORDER — LIDOCAINE HCL 3.5 % OP GEL
OPHTHALMIC | Status: AC
  Administered 2011-03-29: 1 via OPHTHALMIC
  Filled 2011-03-28: qty 5

## 2011-03-28 MED ORDER — LIDOCAINE HCL (PF) 1 % IJ SOLN
INTRAMUSCULAR | Status: AC
  Filled 2011-03-28: qty 2

## 2011-03-29 ENCOUNTER — Ambulatory Visit (HOSPITAL_COMMUNITY)
Admission: RE | Admit: 2011-03-29 | Discharge: 2011-03-29 | Disposition: A | Discharge status: Home / Self Care | Payer: Medicare Other | Source: Ambulatory Visit | Attending: Ophthalmology | Admitting: Ophthalmology

## 2011-03-29 ENCOUNTER — Encounter (HOSPITAL_COMMUNITY): Payer: Self-pay | Admitting: *Deleted

## 2011-03-29 ENCOUNTER — Ambulatory Visit (HOSPITAL_COMMUNITY): Payer: Medicare Other | Admitting: Anesthesiology

## 2011-03-29 ENCOUNTER — Encounter (HOSPITAL_COMMUNITY): Payer: Self-pay | Admitting: Anesthesiology

## 2011-03-29 ENCOUNTER — Encounter (HOSPITAL_COMMUNITY)
Admission: RE | Disposition: A | Discharge status: Home / Self Care | Payer: Self-pay | Source: Ambulatory Visit | Attending: Ophthalmology

## 2011-03-29 DIAGNOSIS — Z01812 Encounter for preprocedural laboratory examination: Secondary | ICD-10-CM | POA: Insufficient documentation

## 2011-03-29 DIAGNOSIS — G4733 Obstructive sleep apnea (adult) (pediatric): Secondary | ICD-10-CM | POA: Insufficient documentation

## 2011-03-29 DIAGNOSIS — Z79899 Other long term (current) drug therapy: Secondary | ICD-10-CM | POA: Insufficient documentation

## 2011-03-29 DIAGNOSIS — I10 Essential (primary) hypertension: Secondary | ICD-10-CM | POA: Insufficient documentation

## 2011-03-29 DIAGNOSIS — H2510 Age-related nuclear cataract, unspecified eye: Secondary | ICD-10-CM | POA: Insufficient documentation

## 2011-03-29 HISTORY — PX: CATARACT EXTRACTION W/PHACO: SHX586

## 2011-03-29 SURGERY — PHACOEMULSIFICATION, CATARACT, WITH IOL INSERTION
Anesthesia: Monitor Anesthesia Care | Site: Eye | Laterality: Right | Wound class: Clean

## 2011-03-29 MED ORDER — CYCLOPENTOLATE-PHENYLEPHRINE 0.2-1 % OP SOLN
1.0000 [drp] | OPHTHALMIC | Status: AC
  Administered 2011-03-29 (×3): 1 [drp] via OPHTHALMIC

## 2011-03-29 MED ORDER — EPINEPHRINE HCL 1 MG/ML IJ SOLN
INTRAMUSCULAR | Status: AC
  Filled 2011-03-29: qty 1

## 2011-03-29 MED ORDER — EPINEPHRINE HCL 1 MG/ML IJ SOLN
INTRAOCULAR | Status: DC | PRN
  Administered 2011-03-29: 09:00:00

## 2011-03-29 MED ORDER — BSS IO SOLN
INTRAOCULAR | Status: DC | PRN
  Administered 2011-03-29: 15 mL via INTRAOCULAR

## 2011-03-29 MED ORDER — LIDOCAINE HCL 3.5 % OP GEL
1.0000 "application " | Freq: Once | OPHTHALMIC | Status: AC
  Administered 2011-03-29: 1 via OPHTHALMIC

## 2011-03-29 MED ORDER — NEOMYCIN-POLYMYXIN-DEXAMETH 0.1 % OP OINT
TOPICAL_OINTMENT | OPHTHALMIC | Status: DC | PRN
  Administered 2011-03-29: 1 via OPHTHALMIC

## 2011-03-29 MED ORDER — POVIDONE-IODINE 5 % OP SOLN
OPHTHALMIC | Status: DC | PRN
  Administered 2011-03-29: 1 via OPHTHALMIC

## 2011-03-29 MED ORDER — PHENYLEPHRINE HCL 2.5 % OP SOLN
1.0000 [drp] | OPHTHALMIC | Status: AC
  Administered 2011-03-29 (×3): 1 [drp] via OPHTHALMIC

## 2011-03-29 MED ORDER — TETRACAINE HCL 0.5 % OP SOLN
1.0000 [drp] | OPHTHALMIC | Status: AC
  Administered 2011-03-29 (×3): 1 [drp] via OPHTHALMIC

## 2011-03-29 MED ORDER — LIDOCAINE HCL (PF) 1 % IJ SOLN
INTRAOCULAR | Status: DC | PRN
  Administered 2011-03-29: 09:00:00 via OPHTHALMIC

## 2011-03-29 MED ORDER — PROVISC 10 MG/ML IO SOLN
INTRAOCULAR | Status: DC | PRN
  Administered 2011-03-29: 8.5 mg via INTRAOCULAR

## 2011-03-29 MED ORDER — ONDANSETRON HCL 4 MG/2ML IJ SOLN: 4.0000 mg | Freq: Once | INTRAMUSCULAR | Status: DC | PRN

## 2011-03-29 MED ORDER — FENTANYL CITRATE 0.05 MG/ML IJ SOLN: 25.0000 ug | INTRAMUSCULAR | Status: DC | PRN

## 2011-03-29 MED ORDER — LACTATED RINGERS IV SOLN
INTRAVENOUS | Status: DC
  Administered 2011-03-29: 1000 mL via INTRAVENOUS

## 2011-03-29 MED ORDER — MIDAZOLAM HCL 2 MG/2ML IJ SOLN
1.0000 mg | INTRAMUSCULAR | Status: DC | PRN
Start: 2011-03-29 — End: 2011-03-29
  Administered 2011-03-29: 2 mg via INTRAVENOUS

## 2011-03-29 MED ORDER — MIDAZOLAM HCL 2 MG/2ML IJ SOLN
INTRAMUSCULAR | Status: AC
  Administered 2011-03-29: 2 mg via INTRAVENOUS
  Filled 2011-03-29: qty 2

## 2011-03-29 MED ORDER — PHENYLEPHRINE HCL 2.5 % OP SOLN
OPHTHALMIC | Status: AC
  Administered 2011-03-29: 1 [drp] via OPHTHALMIC
  Filled 2011-03-29: qty 2

## 2011-03-29 MED ORDER — LIDOCAINE 3.5 % OP GEL OPTIME - NO CHARGE
OPHTHALMIC | Status: DC | PRN
  Administered 2011-03-29: 1 [drp] via OPHTHALMIC

## 2011-03-29 SURGICAL SUPPLY — 32 items

## 2011-03-29 NOTE — Anesthesia Preprocedure Evaluation (Signed)
Anesthesia Evaluation  Patient identified by MRN, date of birth, ID band Patient awake    Reviewed: Allergy & Precautions, H&P , NPO status , Patient's Chart, lab work & pertinent test results  History of Anesthesia Complications (+) PONV  Airway Mallampati: II      Dental  (+) Teeth Intact   Pulmonary sleep apnea ,  breath sounds clear to auscultation        Cardiovascular hypertension, + CAD Rhythm:Regular Rate:Normal     Neuro/Psych CVA    GI/Hepatic GERD-  Medicated,  Endo/Other    Renal/GU      Musculoskeletal   Abdominal   Peds  Hematology   Anesthesia Other Findings   Reproductive/Obstetrics                           Anesthesia Physical Anesthesia Plan  ASA: III  Anesthesia Plan: MAC   Post-op Pain Management:    Induction: Intravenous  Airway Management Planned: Nasal Cannula  Additional Equipment:   Intra-op Plan:   Post-operative Plan:   Informed Consent: I have reviewed the patients History and Physical, chart, labs and discussed the procedure including the risks, benefits and alternatives for the proposed anesthesia with the patient or authorized representative who has indicated his/her understanding and acceptance.     Plan Discussed with:   Anesthesia Plan Comments:         Anesthesia Quick Evaluation  

## 2011-03-29 NOTE — Anesthesia Postprocedure Evaluation (Signed)
  Anesthesia Post-op Note  Patient: Tamara King  Procedure(s) Performed: Procedure(s) (LRB): CATARACT EXTRACTION PHACO AND INTRAOCULAR LENS PLACEMENT (IOC) (Right)  Patient Location: PACU and Short Stay  Anesthesia Type: MAC  Level of Consciousness: awake, alert , oriented and patient cooperative  Airway and Oxygen Therapy: Patient Spontanous Breathing  Post-op Pain: none  Post-op Assessment: Post-op Vital signs reviewed, Patient's Cardiovascular Status Stable, Respiratory Function Stable, Patent Airway and No signs of Nausea or vomiting  Post-op Vital Signs: Reviewed and stable  Complications: No apparent anesthesia complications

## 2011-03-29 NOTE — Brief Op Note (Signed)
Pre-Op Dx: Cataract OD, PXE OD Post-Op Dx: Cataract OD, PXE OD Surgeon: Gemma Payor Anesthesia: Topical with MAC Implant: B&L enVista Specimen: None Complications: None

## 2011-03-29 NOTE — H&P (Signed)
I have reviewed the H&P, the patient was re-examined, and I have identified no interval changes in medical condition and plan of care since the history and physical of record  

## 2011-03-29 NOTE — Transfer of Care (Signed)
Immediate Anesthesia Transfer of Care Note  Patient: Tamara King  Procedure(s) Performed: Procedure(s) (LRB): CATARACT EXTRACTION PHACO AND INTRAOCULAR LENS PLACEMENT (IOC) (Right)  Patient Location: PACU and Short Stay  Anesthesia Type: MAC  Level of Consciousness: awake, alert , oriented and patient cooperative  Airway & Oxygen Therapy: Patient Spontanous Breathing  Post-op Assessment: Report given to PACU RN, Post -op Vital signs reviewed and stable and Patient moving all extremities  Post vital signs: Reviewed and stable  Complications: No apparent anesthesia complications

## 2011-03-30 NOTE — Op Note (Signed)
Tamara King, KELM NO.:  [id number]  MEDICAL RECORD NO.:  [id number]  LOCATION:  [facility]                          FACILITY:  [facility]  PHYSICIAN:  Susanne Greenhouse, MD       DATE OF BIRTH:  June 29, 1940  DATE OF PROCEDURE:  03/29/2011 DATE OF DISCHARGE:  03/29/2011                              OPERATIVE REPORT   PREOPERATIVE DIAGNOSIS:  Nuclear cataract, right eye, diagnosis code 366.16.  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  OPERATION PERFORMED:  Phacoemulsification with posterior chamber intraocular lens implantation, right eye.  SURGEON:  Bonne Dolores. Winona Sison, MD  ANESTHESIA:  General endotracheal anesthesia.  OPERATIVE SUMMARY:  In the preoperative area, dilating drops were placed into the right eye.  The patient was then brought into the operating room where she was placed under general anesthesia.  The eye was then prepped and draped.  Beginning with a 75 blade, a paracentesis port was made at the surgeon's 2 o'clock position.  The anterior chamber was then filled with a 1% nonpreserved lidocaine solution with epinephrine.  This was followed by Viscoat to deepen the chamber.  A small fornix-based peritomy was performed superiorly.  Next, a single iris hook was placed through the limbus superiorly.  A 2.4-mm keratome blade was then used to make a clear corneal incision over the iris hook.  A bent cystotome needle and Utrata forceps were used to create a continuous tear capsulotomy.  Hydrodissection was performed using balanced salt solution on a fine cannula.  The lens nucleus was then removed using phacoemulsification in a quadrant cracking technique.  The cortical material was then removed with irrigation and aspiration.  The capsular bag and anterior chamber were refilled with Provisc.  The wound was widened to approximately 3 mm and a posterior chamber intraocular lens was  placed into the capsular bag without difficulty using an [employer] lens injecting system.  A single 10-0 nylon suture was then used to close the incision as well as stromal hydration.  The Provisc was removed from the anterior chamber and capsular bag with irrigation and aspiration.  At this point, the wounds were tested for leak, which were negative.  The anterior chamber remained deep and stable.  The patient tolerated the procedure well.  There were no operative complications, and she awoke from general anesthesia without problem.  No surgical specimens.  Prosthetic device used is a Actuary, EnVista posterior chamber lens, model MX60, power of 24.5, serial number is [id number].          ______________________________ Susanne Greenhouse, MD     KEH/MEDQ  D:  03/29/2011  T:  03/30/2011  Job:  [id number]

## 2011-04-02 ENCOUNTER — Encounter (HOSPITAL_COMMUNITY): Payer: Self-pay | Admitting: Ophthalmology

## 2011-04-09 ENCOUNTER — Encounter (HOSPITAL_COMMUNITY): Payer: Self-pay | Admitting: Pharmacy Technician

## 2011-04-11 ENCOUNTER — Encounter (HOSPITAL_COMMUNITY): Payer: Self-pay

## 2011-04-11 ENCOUNTER — Other Ambulatory Visit: Payer: Self-pay | Admitting: *Deleted

## 2011-04-11 ENCOUNTER — Encounter (HOSPITAL_COMMUNITY)
Admission: RE | Admit: 2011-04-11 | Discharge: 2011-04-11 | Payer: Medicare Other | Source: Ambulatory Visit | Admitting: Ophthalmology

## 2011-04-11 MED ORDER — VALSARTAN 160 MG PO TABS: 160.0000 mg | ORAL_TABLET | Freq: Every day | ORAL | Status: DC

## 2011-04-13 MED ORDER — LIDOCAINE HCL (PF) 1 % IJ SOLN
INTRAMUSCULAR | Status: AC
  Filled 2011-04-13: qty 2

## 2011-04-13 MED ORDER — CYCLOPENTOLATE-PHENYLEPHRINE 0.2-1 % OP SOLN
OPHTHALMIC | Status: AC
  Filled 2011-04-13: qty 2

## 2011-04-13 MED ORDER — TETRACAINE HCL 0.5 % OP SOLN
OPHTHALMIC | Status: AC
  Filled 2011-04-13: qty 2

## 2011-04-13 MED ORDER — NEOMYCIN-POLYMYXIN-DEXAMETH 3.5-10000-0.1 OP OINT
TOPICAL_OINTMENT | OPHTHALMIC | Status: AC
  Filled 2011-04-13: qty 3.5

## 2011-04-13 MED ORDER — LIDOCAINE HCL 3.5 % OP GEL
OPHTHALMIC | Status: AC
  Filled 2011-04-13: qty 5

## 2011-04-16 ENCOUNTER — Ambulatory Visit (HOSPITAL_COMMUNITY)
Admission: RE | Admit: 2011-04-16 | Discharge: 2011-04-16 | Disposition: A | Discharge status: Home / Self Care | Payer: Medicare Other | Source: Ambulatory Visit | Attending: Ophthalmology | Admitting: Ophthalmology

## 2011-04-16 ENCOUNTER — Encounter (HOSPITAL_COMMUNITY)
Admission: RE | Disposition: A | Discharge status: Home / Self Care | Payer: Self-pay | Source: Ambulatory Visit | Attending: Ophthalmology

## 2011-04-16 ENCOUNTER — Ambulatory Visit (HOSPITAL_COMMUNITY): Payer: Medicare Other | Admitting: Anesthesiology

## 2011-04-16 ENCOUNTER — Encounter (HOSPITAL_COMMUNITY): Payer: Self-pay | Admitting: Ophthalmology

## 2011-04-16 ENCOUNTER — Encounter (HOSPITAL_COMMUNITY): Payer: Self-pay | Admitting: Anesthesiology

## 2011-04-16 DIAGNOSIS — I10 Essential (primary) hypertension: Secondary | ICD-10-CM | POA: Insufficient documentation

## 2011-04-16 DIAGNOSIS — H2181 Floppy iris syndrome: Secondary | ICD-10-CM | POA: Insufficient documentation

## 2011-04-16 DIAGNOSIS — Z79899 Other long term (current) drug therapy: Secondary | ICD-10-CM | POA: Insufficient documentation

## 2011-04-16 DIAGNOSIS — G4733 Obstructive sleep apnea (adult) (pediatric): Secondary | ICD-10-CM | POA: Insufficient documentation

## 2011-04-16 DIAGNOSIS — E7800 Pure hypercholesterolemia, unspecified: Secondary | ICD-10-CM | POA: Insufficient documentation

## 2011-04-16 DIAGNOSIS — H2510 Age-related nuclear cataract, unspecified eye: Secondary | ICD-10-CM | POA: Insufficient documentation

## 2011-04-16 HISTORY — PX: CATARACT EXTRACTION W/PHACO: SHX586

## 2011-04-16 SURGERY — PHACOEMULSIFICATION, CATARACT, WITH IOL INSERTION
Anesthesia: Monitor Anesthesia Care | Site: Eye | Laterality: Left | Wound class: Clean

## 2011-04-16 MED ORDER — FENTANYL CITRATE 0.05 MG/ML IJ SOLN: 25.0000 ug | INTRAMUSCULAR | Status: DC | PRN

## 2011-04-16 MED ORDER — POVIDONE-IODINE 5 % OP SOLN
OPHTHALMIC | Status: DC | PRN
  Administered 2011-04-16: 1 via OPHTHALMIC

## 2011-04-16 MED ORDER — ONDANSETRON HCL 4 MG/2ML IJ SOLN: 4.0000 mg | Freq: Once | INTRAMUSCULAR | Status: DC | PRN

## 2011-04-16 MED ORDER — LIDOCAINE HCL (PF) 1 % IJ SOLN
INTRAOCULAR | Status: DC | PRN
  Administered 2011-04-16: 14:00:00 via OPHTHALMIC

## 2011-04-16 MED ORDER — TETRACAINE HCL 0.5 % OP SOLN
1.0000 [drp] | OPHTHALMIC | Status: AC
  Administered 2011-04-16 (×3): 1 [drp] via OPHTHALMIC

## 2011-04-16 MED ORDER — LIDOCAINE 3.5 % OP GEL OPTIME - NO CHARGE
OPHTHALMIC | Status: DC | PRN
  Administered 2011-04-16: 1 [drp] via OPHTHALMIC

## 2011-04-16 MED ORDER — LIDOCAINE HCL (PF) 1 % IJ SOLN
INTRAMUSCULAR | Status: AC
  Filled 2011-04-16: qty 2

## 2011-04-16 MED ORDER — PHENYLEPHRINE HCL 2.5 % OP SOLN
OPHTHALMIC | Status: AC
  Administered 2011-04-16: 1 [drp] via OPHTHALMIC
  Filled 2011-04-16: qty 2

## 2011-04-16 MED ORDER — PROVISC 10 MG/ML IO SOLN
INTRAOCULAR | Status: DC | PRN
  Administered 2011-04-16: .85 mL via INTRAOCULAR

## 2011-04-16 MED ORDER — PHENYLEPHRINE HCL 2.5 % OP SOLN
1.0000 [drp] | OPHTHALMIC | Status: AC
  Administered 2011-04-16 (×3): 1 [drp] via OPHTHALMIC

## 2011-04-16 MED ORDER — EPINEPHRINE HCL 1 MG/ML IJ SOLN
INTRAMUSCULAR | Status: AC
  Filled 2011-04-16: qty 1

## 2011-04-16 MED ORDER — NEOMYCIN-POLYMYXIN-DEXAMETH 0.1 % OP OINT
TOPICAL_OINTMENT | OPHTHALMIC | Status: DC | PRN
  Administered 2011-04-16: 1 via OPHTHALMIC

## 2011-04-16 MED ORDER — CYCLOPENTOLATE-PHENYLEPHRINE 0.2-1 % OP SOLN
1.0000 [drp] | OPHTHALMIC | Status: AC
  Administered 2011-04-16 (×3): 1 [drp] via OPHTHALMIC

## 2011-04-16 MED ORDER — EPINEPHRINE HCL 1 MG/ML IJ SOLN
INTRAOCULAR | Status: DC | PRN
  Administered 2011-04-16: 14:00:00

## 2011-04-16 MED ORDER — MIDAZOLAM HCL 2 MG/2ML IJ SOLN
INTRAMUSCULAR | Status: AC
  Administered 2011-04-16: 2 mg via INTRAVENOUS
  Filled 2011-04-16: qty 2

## 2011-04-16 MED ORDER — LIDOCAINE HCL 3.5 % OP GEL
1.0000 "application " | Freq: Once | OPHTHALMIC | Status: AC
  Administered 2011-04-16: 1 via OPHTHALMIC

## 2011-04-16 MED ORDER — MIDAZOLAM HCL 2 MG/2ML IJ SOLN
1.0000 mg | INTRAMUSCULAR | Status: DC | PRN
  Administered 2011-04-16: 2 mg via INTRAVENOUS

## 2011-04-16 MED ORDER — BSS IO SOLN
INTRAOCULAR | Status: DC | PRN
  Administered 2011-04-16: 15 mL via INTRAOCULAR

## 2011-04-16 MED ORDER — LACTATED RINGERS IV SOLN
INTRAVENOUS | Status: DC
  Administered 2011-04-16: 13:00:00 via INTRAVENOUS

## 2011-04-16 SURGICAL SUPPLY — 32 items

## 2011-04-16 NOTE — H&P (Signed)
I have reviewed the H&P, the patient was re-examined, and I have identified no interval changes in medical condition and plan of care since the history and physical of record  

## 2011-04-16 NOTE — Brief Op Note (Signed)
Pre-Op Dx: Cataract OS Post-Op Dx: Cataract OS Surgeon: Emelio Schneller Anesthesia: Topical with MAC Implant: B&L, enVista Specimen: None Complications: None 

## 2011-04-16 NOTE — Anesthesia Postprocedure Evaluation (Signed)
  Anesthesia Post-op Note  Patient: Tamara King  Procedure(s) Performed: Procedure(s) (LRB): CATARACT EXTRACTION PHACO AND INTRAOCULAR LENS PLACEMENT (IOC) (Left)  Patient Location: PACU and Short Stay  Anesthesia Type: MAC  Level of Consciousness: awake, alert , oriented and patient cooperative  Airway and Oxygen Therapy: Patient Spontanous Breathing  Post-op Pain: none  Post-op Assessment: Post-op Vital signs reviewed, Patient's Cardiovascular Status Stable, Respiratory Function Stable, Patent Airway, No signs of Nausea or vomiting and Pain level controlled  Post-op Vital Signs: Reviewed and stable  Complications: No apparent anesthesia complications

## 2011-04-16 NOTE — Op Note (Signed)
NAMEJANEECE, BLOK NO.:  [id number]  MEDICAL RECORD NO.:  [id number]  LOCATION:  [facility]                          FACILITY:  [facility]  PHYSICIAN:  Susanne Greenhouse, MD       DATE OF BIRTH:  11/28/1940  DATE OF PROCEDURE:  04/16/2011 DATE OF DISCHARGE:  04/16/2011                              OPERATIVE REPORT   PREOPERATIVE DIAGNOSIS:  Nuclear cataract, left eye, diagnosis code 366.16.  POSTOPERATIVE DIAGNOSES: 1. Nuclear cataract, left eye, diagnosis code 366.16. 2. Intraoperative floppy iris syndrome, diagnosis code 364.81.  OPERATION PERFORMED:  Phacoemulsification with posterior chamber intraocular lens implantation, left eye.  SURGEON:  Bonne Dolores. Stellah Donovan, MD  ANESTHESIA:  General endotracheal anesthesia.  OPERATIVE SUMMARY:  In the preoperative area, dilating drops were placed into the left eye.  The patient was then brought into the operating room where she was placed under general anesthesia.  The eye was then prepped and draped.  Beginning with a 75 blade, a paracentesis port was made at the surgeon's 2 o'clock position.  The anterior chamber was then filled with a 1% nonpreserved lidocaine solution with epinephrine.  This was followed by Viscoat to deepen the chamber.  A small fornix-based peritomy was performed superiorly.  Next, a single iris hook was placed through the limbus superiorly.  A 2.4-mm keratome blade was then used to make a clear corneal incision over the iris hook.  A bent cystotome needle and Utrata forceps were used to create a continuous tear capsulotomy.  Hydrodissection was performed using balanced salt solution on a fine cannula.  The lens nucleus was then removed using phacoemulsification in a quadrant cracking technique.  The cortical material was then removed with irrigation and aspiration.  The capsular bag and anterior chamber were refilled with Provisc.  The wound was widened to approximately 3 mm and a posterior chamber  intraocular lens was placed into the capsular bag without difficulty using an [employer] lens injecting system.  A single 10-0 nylon suture was then used to close the incision as well as stromal hydration.  The Provisc was removed from the anterior chamber and capsular bag with irrigation and aspiration.  At this point, the wounds were tested for leak, which were negative.  The anterior chamber remained deep and stable.  The patient tolerated the procedure well.  There were no operative complications, and she awoke from general anesthesia without problem.  No surgical specimens.  Prosthetic device used is a Designer, industrial/product lens, model MX60, power of 25.5, serial number is [id number].          ______________________________ Susanne Greenhouse, MD     KEH/MEDQ  D:  04/16/2011  T:  04/16/2011  Job:  [id number]

## 2011-04-16 NOTE — Anesthesia Preprocedure Evaluation (Signed)
Anesthesia Evaluation  Patient identified by MRN, date of birth, ID band Patient awake    Reviewed: Allergy & Precautions, H&P , NPO status , Patient's Chart, lab work & pertinent test results  History of Anesthesia Complications (+) PONV  Airway Mallampati: II      Dental  (+) Teeth Intact   Pulmonary sleep apnea ,  breath sounds clear to auscultation        Cardiovascular hypertension, + CAD Rhythm:Regular Rate:Normal     Neuro/Psych CVA    GI/Hepatic GERD-  Medicated,  Endo/Other    Renal/GU      Musculoskeletal   Abdominal   Peds  Hematology   Anesthesia Other Findings   Reproductive/Obstetrics                           Anesthesia Physical Anesthesia Plan  ASA: III  Anesthesia Plan: MAC   Post-op Pain Management:    Induction: Intravenous  Airway Management Planned: Nasal Cannula  Additional Equipment:   Intra-op Plan:   Post-operative Plan:   Informed Consent: I have reviewed the patients History and Physical, chart, labs and discussed the procedure including the risks, benefits and alternatives for the proposed anesthesia with the patient or authorized representative who has indicated his/her understanding and acceptance.     Plan Discussed with:   Anesthesia Plan Comments:         Anesthesia Quick Evaluation

## 2011-04-16 NOTE — Transfer of Care (Signed)
Immediate Anesthesia Transfer of Care Note  Patient: Tamara King  Procedure(s) Performed: Procedure(s) (LRB): CATARACT EXTRACTION PHACO AND INTRAOCULAR LENS PLACEMENT (IOC) (Left)  Patient Location: PACU and Short Stay  Anesthesia Type: MAC  Level of Consciousness: awake, alert , oriented and patient cooperative  Airway & Oxygen Therapy: Patient Spontanous Breathing  Post-op Assessment: Report given to PACU RN, Post -op Vital signs reviewed and stable and Patient moving all extremities  Post vital signs: Reviewed and stable  Complications: No apparent anesthesia complications

## 2011-04-16 NOTE — Discharge Instructions (Signed)
Tamara King  04/16/2011     Instructions  1. Use medications as Instructed.  Shake well before use. Wait 5 minutes between drops.  {OPHTHALMIC ANTIBIOTICS:22167} 4 times a day x 1 week.  {OPHTHALMIC ANTI-INFLAMMATORY:22168} 2 times a day x 4 weeks.  {OPHTHALMIC STEROID:22169} 4 times a day - week 1   3 times a day - Week 2, 2 times a day- Week 3, 1 time a day - Week 4.  2. Do not rub the operative eye. Do not swim underwater for 2 weeks.  3. You may remove the clear shield and resume your normal activities the day after  Surgery. Your eyes may feel more comfortable if you wear dark glasses outside.  4. Call our office at [phone] if you have sudden change in vision, extreme redness or pain. Some fluctuation in vision is normal after surgery. If you have [facility] emergency after hours, call Dr. Alto Denver at [phone].  5. It is important that you attend all of your follow-up appointments.        Follow-up:{follow up:32580} with Gemma Payor, MD.   Dr. Lahoma Crocker: [phone]  Dr. Lita Mains: [phone]  Dr. Alto Denver: [phone]   If you find that you cannot contact your physician, but feel that your signs and   Symptoms warrant a physician's attention, call the Emergency Room at   [phone] ext.[phone].   Other{NA AND UUVOZDGU:44034}.

## 2011-04-19 ENCOUNTER — Encounter (HOSPITAL_COMMUNITY): Payer: Self-pay | Admitting: Ophthalmology

## 2011-05-29 ENCOUNTER — Other Ambulatory Visit: Payer: Self-pay | Admitting: Cardiology

## 2011-05-29 DIAGNOSIS — R0989 Other specified symptoms and signs involving the circulatory and respiratory systems: Secondary | ICD-10-CM

## 2011-05-29 DIAGNOSIS — I6529 Occlusion and stenosis of unspecified carotid artery: Secondary | ICD-10-CM

## 2011-05-29 DIAGNOSIS — I701 Atherosclerosis of renal artery: Secondary | ICD-10-CM

## 2011-06-20 ENCOUNTER — Encounter (INDEPENDENT_AMBULATORY_CARE_PROVIDER_SITE_OTHER): Payer: Medicare Other

## 2011-06-20 DIAGNOSIS — I6529 Occlusion and stenosis of unspecified carotid artery: Secondary | ICD-10-CM

## 2011-06-27 ENCOUNTER — Telehealth: Payer: Self-pay | Admitting: *Deleted

## 2011-06-27 NOTE — Telephone Encounter (Signed)
Patient informed via message machine. 

## 2011-06-27 NOTE — Telephone Encounter (Signed)
Message copied by Eustace Moore on Wed Jun 27, 2011 10:26 AM ------      Message from: Jonelle Sidle      Created: Fri Jun 22, 2011 10:01 PM       Stable carotid disease. Followup one year

## 2011-06-28 ENCOUNTER — Encounter (INDEPENDENT_AMBULATORY_CARE_PROVIDER_SITE_OTHER): Payer: Medicare Other

## 2011-06-28 DIAGNOSIS — I701 Atherosclerosis of renal artery: Secondary | ICD-10-CM

## 2011-07-05 ENCOUNTER — Telehealth: Payer: Self-pay | Admitting: *Deleted

## 2011-07-05 NOTE — Telephone Encounter (Signed)
Message copied by Eustace Moore on Thu Jul 05, 2011  2:42 PM ------      Message from: MCDOWELL, Illene Bolus      Created: Wed Jul 04, 2011 11:32 AM       Report indicates normal renal artery flow status post bilateral renal artery stents. Stable kidney size.

## 2011-07-05 NOTE — Telephone Encounter (Signed)
Patient informed. 

## 2011-07-27 ENCOUNTER — Ambulatory Visit: Payer: Medicare Other | Admitting: Cardiology

## 2011-08-20 ENCOUNTER — Other Ambulatory Visit: Payer: Self-pay | Admitting: *Deleted

## 2011-08-20 MED ORDER — FUROSEMIDE 40 MG PO TABS: 40.0000 mg | ORAL_TABLET | Freq: Every day | ORAL | Status: DC

## 2013-06-09 ENCOUNTER — Encounter: Payer: Self-pay | Admitting: Diagnostic Neuroimaging

## 2013-06-09 ENCOUNTER — Encounter (INDEPENDENT_AMBULATORY_CARE_PROVIDER_SITE_OTHER): Payer: Self-pay

## 2013-06-09 ENCOUNTER — Ambulatory Visit (INDEPENDENT_AMBULATORY_CARE_PROVIDER_SITE_OTHER): Payer: Medicare Other | Admitting: Diagnostic Neuroimaging

## 2013-06-09 VITALS — BP 187/90 | HR 74 | Temp 98.5°F | Ht 63.5 in | Wt 161.0 lb

## 2013-06-09 DIAGNOSIS — H9319 Tinnitus, unspecified ear: Secondary | ICD-10-CM

## 2013-06-09 DIAGNOSIS — R519 Headache, unspecified: Secondary | ICD-10-CM | POA: Insufficient documentation

## 2013-06-09 DIAGNOSIS — R51 Headache: Secondary | ICD-10-CM

## 2013-06-09 NOTE — Patient Instructions (Signed)
I will check MRI brain.   Try to find ways to manage stress in your life.

## 2013-06-09 NOTE — Progress Notes (Signed)
[facility]  PATIENT: Tamara King DOB: February 17, 1940  REFERRING CLINICIAN: S Deveshwar HISTORY FROM: patient  REASON FOR VISIT: new consult   HISTORICAL  CHIEF COMPLAINT:  Chief Complaint  Patient presents with  . Cerebrovascular Accident    whooshing sound in head    HISTORY OF PRESENT ILLNESS:   73 year old right-handed female with hypertension, hypercholesterolemia, arthritis, neuropathy, here for evaluation of abnormal sensation in her head for past 2-4 weeks.  Patient describes a "rush" of fluid or blood sensation from her ears to her head, lasting for one to 2 seconds at a time, 10-15 times per day. No definite ringing sound. She feels like it may be the sound of "water" moving from her ears to her head. She has a difficult time describing the exact sensation or feeling. Symptoms are bilateral and uncomfortable. She feels very aggravated while describing these symptoms to me. Sometimes she sees sparkling lights in front of her. Right now she is having a significant headache.  Patient having significant stress in her life currently, related to having her 2 sons and 2 granddaughters living with her. 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 Patient has difficulty managing stress. During our evaluation she broke down crying.  Patient reports history of brain cancer in 2 family members (brother, sister).  In 2000 patient had a stroke affecting her left face, arm, leg. She had numbness weakness and left-sided of her body. She had slurred speech. She was evaluated at [facility] ([facility]) and transferred to [facility]. She was diagnosed with right carotid stenosis and treated with carotid endarterectomy. Patient is made a full recovery since that time.   REVIEW OF SYSTEMS: Full 14 system review of systems performed and notable only for memory loss allergy easy bruising skin moles.  ALLERGIES: Allergies    Allergen Reactions  . Codeine Nausea And Vomiting  . Fish Allergy Other (See Comments)    "sick as a dog"  . Other Other (See Comments)    Bologna - vomiting  . Penicillins     REACTION: rash  . Vancomycin Other (See Comments)    "drives me crazy"    HOME MEDICATIONS: Outpatient Prescriptions Prior to Visit  Medication Sig Dispense Refill  . acetaminophen (TYLENOL) 325 MG tablet Take 325 mg by mouth as needed. For pain      . aspirin EC 81 MG tablet Take 81 mg by mouth daily.      . fluticasone (FLONASE) 50 MCG/ACT nasal spray Place 2 sprays into the nose daily as needed. For congestion      . Calcium Carbonate-Vitamin D (CALTRATE 600+D) 600-400 MG-UNIT per tablet Take 1 tablet by mouth 2 (two) times daily.       . celecoxib (CELEBREX) 200 MG capsule Take 200 mg by mouth 2 (two) times daily.       . clopidogrel (PLAVIX) 75 MG tablet Take 75 mg by mouth daily.        Marland Kitchen escitalopram (LEXAPRO) 10 MG tablet Take 10 mg by mouth daily.        Marland Kitchen esomeprazole (NEXIUM) 40 MG capsule Take 40 mg by mouth daily.        . furosemide (LASIX) 40 MG tablet Take 1 tablet (40 mg total) by mouth daily.  30 tablet  6  . raloxifene (EVISTA) 60 MG tablet Take 60 mg by mouth daily.        . valsartan (DIOVAN)  160 MG tablet Take 1 tablet (160 mg total) by mouth daily.  30 tablet  6   No facility-administered medications prior to visit.    PAST MEDICAL HISTORY: Past Medical History  Diagnosis Date  . Renal artery stenosis     BMS bilateally 2003, PTCA bilaterally 2005 with restenosis  . Coronary atherosclerosis of native coronary artery     Nonobstructive at catheterization 2003, LVEF normal   . Mixed hyperlipidemia   . Essential hypertension, benign   . Chronic edema   . Breast cancer     Adenocarcinoma  . Stroke   . Complication of anesthesia   . PONV (postoperative nausea and vomiting)   . Sleep apnea     Stop Bang score of 4    PAST SURGICAL HISTORY: Past Surgical History  Procedure  Laterality Date  . Hemorrhoid surgery    . Rotator cuff repair  2008  . Right breast lumpectomy    . Cesarean section      x2  . Appendectomy    . Cataract extraction w/phaco  03/29/2011    Procedure: CATARACT EXTRACTION PHACO AND INTRAOCULAR LENS PLACEMENT (IOC);  Surgeon: Tonny Branch, MD;  Location: [facility];  Service: Ophthalmology;  Laterality: Right;  CDE:12.82  . Cataract extraction w/phaco  04/16/2011    Procedure: CATARACT EXTRACTION PHACO AND INTRAOCULAR LENS PLACEMENT (IOC);  Surgeon: Tonny Branch, MD;  Location: [facility];  Service: Ophthalmology;  Laterality: Left;  CDE: 11.54    FAMILY HISTORY: Family History  Problem Relation Age of Onset  . Anesthesia problems Neg Hx   . Hypotension Neg Hx   . Malignant hyperthermia Neg Hx   . Pseudochol deficiency Neg Hx   . Heart failure Mother   . Cancer Father     SOCIAL HISTORY:  History   Social History  . Marital Status: Widowed    Spouse Name: N/A    Number of Children: 2  . Years of Education: 12th   Occupational History  .      n/a   Social History Main Topics  . Smoking status: Former Smoker -- 1.00 packs/day for 35 years    Types: Cigarettes    Quit date: 01/22/1998  . Smokeless tobacco: Never Used     Comment: tobacco use - no  . Alcohol Use: No  . Drug Use: No  . Sexual Activity: Not on file   Other Topics Concern  . Not on file   Social History Narrative   Patient lives at home with family.   Caffeine use; 2-5 cups daily     PHYSICAL EXAM  Filed Vitals:   06/09/13 1022  BP: 187/90  Pulse: 74  Temp: 98.5 F (36.9 C)  TempSrc: Oral  Height: 5' 3.5" (1.613 m)  Weight: 161 lb (73.029 kg)    Not recorded    Body mass index is 28.07 kg/(m^2).  GENERAL EXAM: Patient is in no distress; well developed, nourished and groomed; neck is supple  CARDIOVASCULAR: Regular rate and rhythm, no murmurs, no carotid bruits  NEUROLOGIC: MENTAL STATUS: awake, alert, oriented to person, place and time, recent  and remote memory intact, normal attention and concentration, language fluent, comprehension intact, naming intact, fund of knowledge appropriate CRANIAL NERVE: no papilledema on fundoscopic exam, pupils equal and reactive to light, visual fields full to confrontation, extraocular muscles intact, no nystagmus, facial sensation and strength symmetric, hearing intact, palate elevates symmetrically, uvula midline, shoulder shrug symmetric, tongue midline. INTERMITTENT RIGHT NASALIS SPASM. MOTOR: normal bulk.  Full strength in the BUE, BLE; INTERMITTENT RIGHT HAND REST TREMOR. POSTURAL TREMOR IN BUE.  SENSORY: normal and symmetric to light touch, temperature, vibration; EXCEPT ABSENT VIB AT TOES. DECR PP IN FEET. COORDINATION: finger-nose-finger, fine finger movements normal REFLEXES: deep tendon reflexes present and symmetric; ABSENT AT ANKLES. GAIT/STATION: narrow based gait; SLOW, ANTALGIA, CAUTIOUS. CANNOT TOE, HEEL OR TANDEM. Romberg is negative    DIAGNOSTIC DATA (LABS, IMAGING, TESTING) - I reviewed patient records, labs, notes, testing and imaging myself where available.  Lab Results  Component Value Date   WBC 6.3 09/30/2006   HGB 14.3 03/22/2011   HCT 42.4 03/22/2011   MCV 89.8 09/30/2006   PLT 209 09/30/2006      Component Value Date/Time   NA 140 03/22/2011 1400   K 4.9 03/22/2011 1400   CL 100 03/22/2011 1400   CO2 30 03/22/2011 1400   GLUCOSE 87 03/22/2011 1400   BUN 25* 03/22/2011 1400   CREATININE 1.07 03/22/2011 1400   CALCIUM 10.7* 03/22/2011 1400   PROT 6.3 09/30/2006 1308   ALBUMIN 3.9 09/30/2006 1308   AST 22 09/30/2006 1308   ALT 17 09/30/2006 1308   ALKPHOS 79 09/30/2006 1308   BILITOT 0.7 09/30/2006 1308   GFRNONAA 52* 03/22/2011 1400   GFRAA 60* 03/22/2011 1400   No results found for this basename: CHOL, HDL, LDLCALC, LDLDIRECT, TRIG, CHOLHDL   No results found for this basename: HGBA1C   No results found for this basename: VITAMINB12   No results found for this basename: TSH      ASSESSMENT AND PLAN  73 y.o. year old female here with new onset abnormal sensation in her head for past 2-4 weeks, with intermittent "rush" sensation lasting a few seconds at a time, 10-15 times per day. Also with new onset headache. Exam notable for some focal tremor/spasm on the right face and right arm. We'll pursue neuroimaging to rule out intracranial abnormality.  Ddx: structural, vascular, inflammatory, stress reaction  PLAN: - MRI brain (with and without)  - stress mgmt reviewed  Orders Placed This Encounter  Procedures  . MR Brain W Wo Contrast   Return in about 3 months (around 09/09/2013).    Penni Bombard, MD 9/56/3875, 64:33 AM Certified in Neurology, Neurophysiology and Neuroimaging  [facility] [address] [place], [place] [address] [phone]

## 2013-06-22 ENCOUNTER — Ambulatory Visit
Admission: RE | Admit: 2013-06-22 | Discharge: 2013-06-22 | Disposition: A | Discharge status: Home / Self Care | Payer: Medicare Other | Source: Ambulatory Visit | Attending: Diagnostic Neuroimaging | Admitting: Diagnostic Neuroimaging

## 2013-06-22 DIAGNOSIS — H9319 Tinnitus, unspecified ear: Secondary | ICD-10-CM

## 2013-06-22 DIAGNOSIS — R51 Headache: Secondary | ICD-10-CM

## 2013-06-22 MED ORDER — GADOBENATE DIMEGLUMINE 529 MG/ML IV SOLN
8.0000 mL | Freq: Once | INTRAVENOUS | Status: AC | PRN
  Administered 2013-06-22: 8 mL via INTRAVENOUS

## 2013-07-02 ENCOUNTER — Telehealth: Payer: Self-pay | Admitting: Diagnostic Neuroimaging

## 2013-07-02 NOTE — Telephone Encounter (Signed)
Called pt and relayed the MRI results to pt.  SVD/old infarct. F/u in 08-2013.

## 2013-07-02 NOTE — Telephone Encounter (Signed)
Patient calling to get MRI results. °

## 2013-07-06 DIAGNOSIS — I639 Cerebral infarction, unspecified: Secondary | ICD-10-CM | POA: Insufficient documentation

## 2013-07-06 DIAGNOSIS — I10 Essential (primary) hypertension: Secondary | ICD-10-CM | POA: Insufficient documentation

## 2013-07-06 DIAGNOSIS — I2510 Atherosclerotic heart disease of native coronary artery without angina pectoris: Secondary | ICD-10-CM | POA: Insufficient documentation

## 2013-07-06 DIAGNOSIS — K219 Gastro-esophageal reflux disease without esophagitis: Secondary | ICD-10-CM

## 2013-07-06 DIAGNOSIS — M1990 Unspecified osteoarthritis, unspecified site: Secondary | ICD-10-CM | POA: Insufficient documentation

## 2013-07-06 HISTORY — DX: Gastro-esophageal reflux disease without esophagitis: K21.9

## 2013-09-10 ENCOUNTER — Ambulatory Visit: Payer: Medicare Other | Admitting: Diagnostic Neuroimaging

## 2013-10-06 DIAGNOSIS — G609 Hereditary and idiopathic neuropathy, unspecified: Secondary | ICD-10-CM

## 2013-10-06 HISTORY — DX: Hereditary and idiopathic neuropathy, unspecified: G60.9

## 2014-08-23 DIAGNOSIS — I6990 Unspecified sequelae of unspecified cerebrovascular disease: Secondary | ICD-10-CM | POA: Insufficient documentation

## 2014-08-23 DIAGNOSIS — M1990 Unspecified osteoarthritis, unspecified site: Secondary | ICD-10-CM | POA: Insufficient documentation

## 2016-01-23 HISTORY — PX: CHOLECYSTECTOMY: SHX55

## 2016-06-17 ENCOUNTER — Observation Stay (HOSPITAL_COMMUNITY)
Admission: EM | Admit: 2016-06-17 | Discharge: 2016-06-18 | Disposition: A | Discharge status: Home / Self Care | Payer: Medicare Other | Attending: Nephrology | Admitting: Nephrology

## 2016-06-17 ENCOUNTER — Observation Stay (HOSPITAL_COMMUNITY): Payer: Medicare Other

## 2016-06-17 ENCOUNTER — Observation Stay (HOSPITAL_BASED_OUTPATIENT_CLINIC_OR_DEPARTMENT_OTHER): Payer: Medicare Other

## 2016-06-17 ENCOUNTER — Encounter (HOSPITAL_COMMUNITY): Payer: Self-pay | Admitting: Emergency Medicine

## 2016-06-17 ENCOUNTER — Emergency Department (HOSPITAL_COMMUNITY): Payer: Medicare Other

## 2016-06-17 DIAGNOSIS — Z79899 Other long term (current) drug therapy: Secondary | ICD-10-CM | POA: Diagnosis not present

## 2016-06-17 DIAGNOSIS — R7989 Other specified abnormal findings of blood chemistry: Secondary | ICD-10-CM | POA: Diagnosis not present

## 2016-06-17 DIAGNOSIS — I129 Hypertensive chronic kidney disease with stage 1 through stage 4 chronic kidney disease, or unspecified chronic kidney disease: Secondary | ICD-10-CM | POA: Diagnosis not present

## 2016-06-17 DIAGNOSIS — E876 Hypokalemia: Secondary | ICD-10-CM | POA: Diagnosis not present

## 2016-06-17 DIAGNOSIS — E782 Mixed hyperlipidemia: Secondary | ICD-10-CM | POA: Insufficient documentation

## 2016-06-17 DIAGNOSIS — Z8673 Personal history of transient ischemic attack (TIA), and cerebral infarction without residual deficits: Secondary | ICD-10-CM | POA: Diagnosis not present

## 2016-06-17 DIAGNOSIS — N183 Chronic kidney disease, stage 3 (moderate): Secondary | ICD-10-CM | POA: Insufficient documentation

## 2016-06-17 DIAGNOSIS — Z87891 Personal history of nicotine dependence: Secondary | ICD-10-CM | POA: Diagnosis not present

## 2016-06-17 DIAGNOSIS — R079 Chest pain, unspecified: Secondary | ICD-10-CM | POA: Diagnosis present

## 2016-06-17 DIAGNOSIS — Z7982 Long term (current) use of aspirin: Secondary | ICD-10-CM | POA: Insufficient documentation

## 2016-06-17 DIAGNOSIS — I739 Peripheral vascular disease, unspecified: Secondary | ICD-10-CM | POA: Insufficient documentation

## 2016-06-17 DIAGNOSIS — E785 Hyperlipidemia, unspecified: Secondary | ICD-10-CM | POA: Diagnosis not present

## 2016-06-17 DIAGNOSIS — Z853 Personal history of malignant neoplasm of breast: Secondary | ICD-10-CM | POA: Insufficient documentation

## 2016-06-17 DIAGNOSIS — I25119 Atherosclerotic heart disease of native coronary artery with unspecified angina pectoris: Secondary | ICD-10-CM

## 2016-06-17 DIAGNOSIS — Z7902 Long term (current) use of antithrombotics/antiplatelets: Secondary | ICD-10-CM | POA: Diagnosis not present

## 2016-06-17 DIAGNOSIS — I160 Hypertensive urgency: Secondary | ICD-10-CM | POA: Diagnosis not present

## 2016-06-17 DIAGNOSIS — I2510 Atherosclerotic heart disease of native coronary artery without angina pectoris: Secondary | ICD-10-CM | POA: Diagnosis not present

## 2016-06-17 DIAGNOSIS — I340 Nonrheumatic mitral (valve) insufficiency: Secondary | ICD-10-CM | POA: Diagnosis not present

## 2016-06-17 DIAGNOSIS — G4730 Sleep apnea, unspecified: Secondary | ICD-10-CM | POA: Diagnosis not present

## 2016-06-17 DIAGNOSIS — R072 Precordial pain: Secondary | ICD-10-CM

## 2016-06-17 LAB — I-STAT TROPONIN, ED: Troponin i, poc: 0.03 ng/mL (ref 0.00–0.08)

## 2016-06-17 LAB — BASIC METABOLIC PANEL
Anion gap: 11 (ref 5–15)
Anion gap: 9 (ref 5–15)
BUN: 18 mg/dL (ref 6–20)
BUN: 15 mg/dL (ref 6–20)
CHLORIDE: 102 mmol/L (ref 101–111)
CHLORIDE: 99 mmol/L — AB (ref 101–111)
CO2: 29 mmol/L (ref 22–32)
CO2: 30 mmol/L (ref 22–32)
CREATININE: 1.06 mg/dL — AB (ref 0.44–1.00)
Calcium: 9.1 mg/dL (ref 8.9–10.3)
Calcium: 9.3 mg/dL (ref 8.9–10.3)
Creatinine, Ser: 1.28 mg/dL — ABNORMAL HIGH (ref 0.44–1.00)
GFR calc non Af Amer: 40 mL/min — ABNORMAL LOW (ref >60)
GFR calc non Af Amer: 51 mL/min — ABNORMAL LOW (ref >60)
GFR, EST AFRICAN AMERICAN: 47 mL/min — AB (ref >60)
GFR, EST AFRICAN AMERICAN: 59 mL/min — AB (ref >60)
Glucose, Bld: 132 mg/dL — ABNORMAL HIGH (ref 65–99)
Glucose, Bld: 122 mg/dL — ABNORMAL HIGH (ref 65–99)
POTASSIUM: 2.9 mmol/L — AB (ref 3.5–5.1)
POTASSIUM: 4.1 mmol/L (ref 3.5–5.1)
Sodium: 139 mmol/L (ref 135–145)
Sodium: 141 mmol/L (ref 135–145)

## 2016-06-17 LAB — ECHOCARDIOGRAM COMPLETE
AVLVOTPG: 4 mmHg
Area-P 1/2: 3.06 cm2
CHL CUP DOP CALC LVOT VTI: 24.3 cm
CHL CUP MV DEC (S): 246
E decel time: 246 msec
E/e' ratio: 10.3
FS: 42 % (ref 28–44)
Height: 63.5 in
IV/PV OW: 1.43
LA diam index: 2.08 cm/m2
LA vol A4C: 54.7 ml
LA vol: 59.9 mL
LASIZE: 35 mm
LAVOLIN: 35.7 mL/m2
LDCA: 3.14 cm2
LEFT ATRIUM END SYS DIAM: 35 mm
LV PW d: 8.35 mm — AB (ref 0.6–1.1)
LV TDI E'LATERAL: 8.27
LV TDI E'MEDIAL: 6.09
LVEEAVG: 10.3
LVEEMED: 10.3
LVELAT: 8.27 cm/s
LVOT diameter: 20 mm
LVOTPV: 98.9 cm/s
LVOTSV: 76 mL
Lateral S' vel: 9.46 cm/s
MV pk A vel: 107 m/s
MV pk E vel: 85.2 m/s
MVPG: 3 mmHg
P 1/2 time: 72 ms
RV sys press: 35 mmHg
Reg peak vel: 285 cm/s
TAPSE: 15.9 mm
TR max vel: 285 cm/s
Weight: 2273.6 oz

## 2016-06-17 LAB — CBC
HEMATOCRIT: 42 % (ref 36.0–46.0)
HEMOGLOBIN: 14.2 g/dL (ref 12.0–15.0)
MCH: 30.1 pg (ref 26.0–34.0)
MCHC: 33.8 g/dL (ref 30.0–36.0)
MCV: 89.2 fL (ref 78.0–100.0)
Platelets: 214 10*3/uL (ref 150–400)
RBC: 4.71 MIL/uL (ref 3.87–5.11)
RDW: 12.5 % (ref 11.5–15.5)
WBC: 8.8 10*3/uL (ref 4.0–10.5)

## 2016-06-17 LAB — D-DIMER, QUANTITATIVE (NOT AT ARMC): D DIMER QUANT: 1.16 ug{FEU}/mL — AB (ref 0.00–0.50)

## 2016-06-17 LAB — TROPONIN I
Troponin I: 0.04 ng/mL (ref <0.03)
Troponin I: 0.04 ng/mL (ref <0.03)
Troponin I: <0.03 ng/mL (ref <0.03)

## 2016-06-17 MED ORDER — PANTOPRAZOLE SODIUM 40 MG PO TBEC
40.0000 mg | DELAYED_RELEASE_TABLET | Freq: Every day | ORAL | Status: DC
  Administered 2016-06-17 – 2016-06-18 (×2): 40 mg via ORAL
  Filled 2016-06-17 (×2): qty 1

## 2016-06-17 MED ORDER — POTASSIUM CHLORIDE CRYS ER 20 MEQ PO TBCR
40.0000 meq | EXTENDED_RELEASE_TABLET | ORAL | Status: AC
Start: 2016-06-17 — End: 2016-06-17
  Administered 2016-06-17: 40 meq via ORAL
  Filled 2016-06-17: qty 2

## 2016-06-17 MED ORDER — METOPROLOL TARTRATE 5 MG/5ML IV SOLN
5.0000 mg | Freq: Once | INTRAVENOUS | Status: AC
  Administered 2016-06-17: 5 mg via INTRAVENOUS
  Filled 2016-06-17: qty 5

## 2016-06-17 MED ORDER — HYDRALAZINE HCL 20 MG/ML IJ SOLN: 10.0000 mg | INTRAMUSCULAR | Status: DC | PRN

## 2016-06-17 MED ORDER — IOPAMIDOL (ISOVUE-370) INJECTION 76%
INTRAVENOUS | Status: AC
  Administered 2016-06-17: 100 mL
  Filled 2016-06-17: qty 100

## 2016-06-17 MED ORDER — FUROSEMIDE 40 MG PO TABS
40.0000 mg | ORAL_TABLET | ORAL | Status: DC
  Administered 2016-06-17: 40 mg via ORAL
  Filled 2016-06-17: qty 1

## 2016-06-17 MED ORDER — METOPROLOL TARTRATE 25 MG PO TABS
25.0000 mg | ORAL_TABLET | Freq: Two times a day (BID) | ORAL | Status: DC
  Administered 2016-06-17 – 2016-06-18 (×3): 25 mg via ORAL
  Filled 2016-06-17 (×3): qty 1

## 2016-06-17 MED ORDER — ASPIRIN EC 81 MG PO TBEC
81.0000 mg | DELAYED_RELEASE_TABLET | Freq: Every day | ORAL | Status: DC
  Administered 2016-06-17 – 2016-06-18 (×2): 81 mg via ORAL
  Filled 2016-06-17 (×2): qty 1

## 2016-06-17 MED ORDER — ENOXAPARIN SODIUM 60 MG/0.6ML ~~LOC~~ SOLN
60.0000 mg | Freq: Once | SUBCUTANEOUS | Status: AC
  Administered 2016-06-17: 07:00:00 60 mg via SUBCUTANEOUS
  Filled 2016-06-17: qty 0.6

## 2016-06-17 MED ORDER — POTASSIUM CHLORIDE 10 MEQ/100ML IV SOLN
10.0000 meq | INTRAVENOUS | Status: AC
  Administered 2016-06-17 (×2): 10 meq via INTRAVENOUS
  Filled 2016-06-17 (×2): qty 100

## 2016-06-17 MED ORDER — CLOPIDOGREL BISULFATE 75 MG PO TABS
75.0000 mg | ORAL_TABLET | Freq: Every day | ORAL | Status: DC
  Administered 2016-06-17 – 2016-06-18 (×2): 75 mg via ORAL
  Filled 2016-06-17 (×2): qty 1

## 2016-06-17 MED ORDER — MORPHINE SULFATE (PF) 4 MG/ML IV SOLN: 2.0000 mg | INTRAVENOUS | Status: DC | PRN

## 2016-06-17 MED ORDER — HYDRALAZINE HCL 20 MG/ML IJ SOLN
10.0000 mg | Freq: Once | INTRAMUSCULAR | Status: AC
  Administered 2016-06-17: 10 mg via INTRAVENOUS
  Filled 2016-06-17: qty 1

## 2016-06-17 MED ORDER — PRAVASTATIN SODIUM 40 MG PO TABS
40.0000 mg | ORAL_TABLET | Freq: Every day | ORAL | Status: DC
  Administered 2016-06-17: 40 mg via ORAL
  Filled 2016-06-17: qty 1

## 2016-06-17 MED ORDER — POTASSIUM CHLORIDE CRYS ER 20 MEQ PO TBCR: 60.0000 meq | EXTENDED_RELEASE_TABLET | ORAL | Status: DC

## 2016-06-17 MED ORDER — ENOXAPARIN SODIUM 60 MG/0.6ML ~~LOC~~ SOLN
60.0000 mg | Freq: Two times a day (BID) | SUBCUTANEOUS | Status: DC
  Administered 2016-06-17: 23:00:00 60 mg via SUBCUTANEOUS
  Filled 2016-06-17: qty 0.6

## 2016-06-17 MED ORDER — GI COCKTAIL ~~LOC~~: 30.0000 mL | Freq: Four times a day (QID) | ORAL | Status: DC | PRN

## 2016-06-17 MED ORDER — FLUTICASONE PROPIONATE 50 MCG/ACT NA SUSP
2.0000 | Freq: Every day | NASAL | Status: DC | PRN
  Filled 2016-06-17: qty 16

## 2016-06-17 MED ORDER — ONDANSETRON HCL 4 MG/2ML IJ SOLN: 4.0000 mg | Freq: Four times a day (QID) | INTRAMUSCULAR | Status: DC | PRN

## 2016-06-17 MED ORDER — SODIUM CHLORIDE 0.9 % IV SOLN
INTRAVENOUS | Status: DC
  Administered 2016-06-17 – 2016-06-18 (×2): via INTRAVENOUS

## 2016-06-17 MED ORDER — ACETAMINOPHEN 325 MG PO TABS: 650.0000 mg | ORAL_TABLET | ORAL | Status: DC | PRN

## 2016-06-17 NOTE — H&P (Signed)
History and Physical    Tamara King BJS:[id number] DOB: 1940-12-13 DOA: 06/17/2016  Referring MD/NP/PA: Dr. Thayer Jew PCP: Dione Housekeeper, MD  Patient coming from: Home via EMS  Chief Complaint: Chest pain   HPI: Tamara King is a 76 y.o. female with medical history significant of HTN, HLD, CAD, CKD stage III, and adenocarcinoma of the breast; who presented with complaints of chest pain. Patient reports centralized excruciating chest pain and pressure starting around 11 PM last night while lying in bed while lying in bed. Associated symptoms included feeling nauseous, dry heaves, and muscle cramps. Complaints of lower extremity swelling and tenderness are more so chronic. Patient reports having similar pain for which she was admitted to [facility] one week ago. Patient reports that she underwent full cardiac workup including stress test and CT chest which was reported to be negative. Denies having any significant fever, orthopnea, palpitations, loss of consciousness, change in weight, or recent travel. Patient also reports being taken off of amlodipine recently and subsequent reported allergies to ACE-I and ARBs.   ED Course: Upon admission to the emergency department patient was seen have blood pressures elevated up to 222/93, and all other vital signs relatively within normal limits. Laboratory revealed potassium 2.9, creatinine 1.28, d-dimer 1.16, troponin negative. EKG showing T-wave changes. Patient was given 10 mg hydralazine for blood pressures. TRH called for subsequent monitoring to obtain records and possibly consider VQ scan given chronic kidney disease. Patient currently denies having any chest pain symptoms.  Review of Systems: As per HPI otherwise 10 point review of systems negative.   Past Medical History:  Diagnosis Date  . Breast cancer ([facility])    Adenocarcinoma  . Chronic edema   . Complication of anesthesia   . Coronary atherosclerosis of native coronary  artery    Nonobstructive at catheterization 2003, LVEF normal   . Essential hypertension, benign   . Mixed hyperlipidemia   . PONV (postoperative nausea and vomiting)   . Renal artery stenosis ([facility])    BMS bilateally 2003, PTCA bilaterally 2005 with restenosis  . Sleep apnea    Stop Bang score of 4  . Stroke [facility])     Past Surgical History:  Procedure Laterality Date  . APPENDECTOMY    . CATARACT EXTRACTION W/PHACO  03/29/2011   Procedure: CATARACT EXTRACTION PHACO AND INTRAOCULAR LENS PLACEMENT (IOC);  Surgeon: Tonny Branch, MD;  Location: [facility];  Service: Ophthalmology;  Laterality: Right;  CDE:12.82  . CATARACT EXTRACTION W/PHACO  04/16/2011   Procedure: CATARACT EXTRACTION PHACO AND INTRAOCULAR LENS PLACEMENT (IOC);  Surgeon: Tonny Branch, MD;  Location: [facility];  Service: Ophthalmology;  Laterality: Left;  CDE: 11.54  . CESAREAN SECTION     x2  . HEMORRHOID SURGERY    . Right breast lumpectomy    . ROTATOR CUFF REPAIR  2008     reports that she quit smoking about 18 years ago. Her smoking use included Cigarettes. She has a 35.00 pack-year smoking history. She has never used smokeless tobacco. She reports that she does not drink alcohol or use drugs.  Allergies  Allergen Reactions  . Codeine Nausea And Vomiting  . Fish Allergy Other (See Comments)    "sick as a dog"  . Other Other (See Comments)    Bologna - vomiting  . Penicillins     REACTION: rash  . Sulfa Antibiotics   . Vancomycin Other (See Comments)    "drives me crazy"    Family History  Problem  Relation Age of Onset  . Anesthesia problems Neg Hx   . Hypotension Neg Hx   . Malignant hyperthermia Neg Hx   . Pseudochol deficiency Neg Hx   . Heart failure Mother   . Cancer Father     Prior to Admission medications   Medication Sig Start Date End Date Taking? Authorizing Provider  acetaminophen (TYLENOL) 325 MG tablet Take 325 mg by mouth as needed. For pain    [provider]  aspirin EC 81 MG tablet  Take 81 mg by mouth daily.    [provider]  clopidogrel (PLAVIX) 75 MG tablet Take 75 mg by mouth daily with breakfast.    [provider]  fluticasone (FLONASE) 50 MCG/ACT nasal spray Place 2 sprays into the nose daily as needed. For congestion    [provider]  furosemide (LASIX) 40 MG tablet Take 40 mg by mouth.    [provider]  gabapentin (NEURONTIN) 100 MG capsule Take 100 mg by mouth 2 (two) times daily.    [provider]  losartan (COZAAR) 100 MG tablet Take 100 mg by mouth daily.    [provider]  meloxicam (MOBIC) 7.5 MG tablet Take 7.5 mg by mouth daily.    [provider]  pantoprazole (PROTONIX) 40 MG tablet Take 40 mg by mouth daily.    [provider]  pravastatin (PRAVACHOL) 40 MG tablet Take 40 mg by mouth daily.    [provider]    Physical Exam:    Constitutional: Elderly female currently in NAD, calm, comfortable Vitals:   06/17/16 0042 06/17/16 0100 06/17/16 0200 06/17/16 0302  BP: (!) 222/93   (!) 196/61  Pulse: 69 61 61 (!) 57  Resp: 18 16 17 14   Temp: 97.6 F (36.4 C)     TempSrc: Oral     SpO2: 99% 94% 94% 96%  Weight: 66.2 kg (146 lb)     Height: 5' 3.5" (1.613 m)      Eyes: PERRL, lids and conjunctivae normal ENMT: Mucous membranes are moist. Posterior pharynx clear of any exudate or lesions.\.  Neck: normal, supple, no masses, no thyromegaly Respiratory: clear to auscultation bilaterally, no wheezing, no crackles. Normal respiratory effort. No accessory muscle use.  Cardiovascular: Regular rate and rhythm, no murmurs / rubs / gallops. No extremity edema. 2+ pedal pulses. No carotid bruits.  Abdomen: no tenderness, no masses palpated. No hepatosplenomegaly. Bowel sounds positive.  Musculoskeletal: no clubbing / cyanosis. No joint deformity upper and lower extremities. Good ROM, no contractures. Normal muscle tone.  Skin: no rashes, lesions, ulcers. No  induration Neurologic: CN 2-12 grossly intact. Sensation intact, DTR normal. Strength 5/5 in all 4.  Psychiatric: Normal judgment and insight. Alert and oriented x 3. Normal mood.     Labs on Admission: I have personally reviewed following labs and imaging studies  CBC:  Recent Labs Lab 06/17/16 0056  WBC 8.8  HGB 14.2  HCT 42.0  MCV 89.2  PLT 250   Basic Metabolic Panel:  Recent Labs Lab 06/17/16 0056  NA 139  K 2.9*  CL 99*  CO2 29  GLUCOSE 132*  BUN 18  CREATININE 1.28*  CALCIUM 9.1   GFR: Estimated Creatinine Clearance: 36.2 mL/min (A) (by C-G formula based on SCr of 1.28 mg/dL (H)). Liver Function Tests: No results for input(s): AST, ALT, ALKPHOS, BILITOT, PROT, ALBUMIN in the last 168 hours. No results for input(s): LIPASE, AMYLASE in the last 168 hours. No results for  input(s): AMMONIA in the last 168 hours. Coagulation Profile: No results for input(s): INR, PROTIME in the last 168 hours. Cardiac Enzymes: No results for input(s): CKTOTAL, CKMB, CKMBINDEX, TROPONINI in the last 168 hours. BNP (last 3 results) No results for input(s): PROBNP in the last 8760 hours. HbA1C: No results for input(s): HGBA1C in the last 72 hours. CBG: No results for input(s): GLUCAP in the last 168 hours. Lipid Profile: No results for input(s): CHOL, HDL, LDLCALC, TRIG, CHOLHDL, LDLDIRECT in the last 72 hours. Thyroid Function Tests: No results for input(s): TSH, T4TOTAL, FREET4, T3FREE, THYROIDAB in the last 72 hours. Anemia Panel: No results for input(s): VITAMINB12, FOLATE, FERRITIN, TIBC, IRON, RETICCTPCT in the last 72 hours. Urine analysis:    Component Value Date/Time   COLORURINE YELLOW 09/30/2006 [place] 09/30/2006 1308   LABSPEC 1.025 09/30/2006 1308   PHURINE 6.0 09/30/2006 1308   GLUCOSEU NEGATIVE 09/30/2006 1308   HGBUR NEGATIVE 09/30/2006 1308   BILIRUBINUR NEGATIVE 09/30/2006 1308   KETONESUR TRACE (A) 09/30/2006 1308   PROTEINUR  NEGATIVE 09/30/2006 1308   UROBILINOGEN 1.0 09/30/2006 1308   NITRITE NEGATIVE 09/30/2006 1308   LEUKOCYTESUR  09/30/2006 1308    NEGATIVE MICROSCOPIC NOT DONE ON URINES WITH NEGATIVE PROTEIN, BLOOD, LEUKOCYTES, NITRITE, OR GLUCOSE <1000 mg/dL.   Sepsis Labs: No results found for this or any previous visit (from the past 240 hour(s)).   Radiological Exams on Admission: Dg Chest 2 View  Result Date: 06/17/2016 CLINICAL DATA:  Epigastric pain after dinner this evening which resolved on the way to the hospital. Chest pain. EXAM: CHEST  2 VIEW COMPARISON:  05/12/2005 FINDINGS: Normal heart size and pulmonary vascularity. No focal airspace disease or consolidation in the lungs. No blunting of costophrenic angles. No pneumothorax. Mediastinal contours appear intact. Aortic calcifications. Degenerative changes in the spine and shoulders. IMPRESSION: No active cardiopulmonary disease.  Aortic atherosclerosis. Electronically Signed   By: Lucienne Capers M.D.   On: 06/17/2016 01:40    EKG: Independently reviewed. sinus rhythm with nonspecific T-wave changes  Assessment/Plan Chest pain: Acute. Patient with Heart score of at least 4. Chest pain symptoms now  resolved - Admit to a telemetry bed - Trend cardiac enzymes - Continue aspirin  - Obtain records from [facility]   Elevated d-dimer: D-dimer elevated at 1.16 on admission. Question if this could possibly Cause of patient's symptoms. - Consider needle VQ scan/CT angiogram in a.m.  Hypokalemia: Potassium initially 2.9 on admission. Patient given potassium chloride 20 mEq IV on the ED. - Give additional potassium chloride 40 mEq orally - continue to monitor and replace as needed   Hypertensive urgency: Patient with blood pressures noted to be as high as 222/93 on admission. - Continue Lopressor and furosemide - Hydralazine IV prn  CKD stage 3: Patient noted have a creatinine of 1.28 on admission. Creatinine previously noted to be 0.95-1. -  IVF NS at 38ml/hr  Coronary artery disease - Continue Plavix and aspirin  Hyperlipidemia - Continue pravastatin   DVT prophylaxis: Lovenox for VTE treatment Code Status: Full   Family Communication: Discussed one of care with patient family present at bedside Disposition Plan: Discharge home once medically stable Consults called: None Admission status: Observation  Norval Morton MD [facility] Pager [phone]  If 7PM-7AM, please contact night-coverage [email or web address] Password [email or web address]  06/17/2016, 3:17 AM

## 2016-06-17 NOTE — Progress Notes (Signed)
Paged MD regarding IV team requesting to use right arm, pt states surgery was 12 years ago. MD returned page and stated to place IV, okay to use right arm   Tamara King

## 2016-06-17 NOTE — Progress Notes (Addendum)
Patient was seen and examined at bedside. Please see H&P from today for detail. 76 year old female who presented with chest pain mostly on the right side associated with shortness of breath. She has mild elevation in troponin. Also has elevated d-dimer. Patient was restarted on therapeutic Lovenox subcutaneous daily as per presumed pulmonary embolism. -Serum creatinine level improved. Continue IV fluid. Holding Lasix although received morning dose. Discussed with the patient and her son at bedside, plan for CT chest with angiogram per the evaluation of PE. She has no recent travel but has chronic left knee pain and therefore ordered vascular Doppler ultrasound. -Given mild elevation in troponin I'll order echocardiogram -Hypokalemia improved. Continue to monitor labs. Monitor heart rate, blood pressure. Continue current medications. Try to obtain old medical records from outside hospital. DVT prophylaxis on Lovenox subcutaneous. It is.  Blood pressure 147/53, heart rate 70, on room air 95% oxygen saturation Lying in bed comfortable, not in distress Lungs clear bilateral, no wheezing or crackle Cardiovascular: Regular rate rhythm, S1-S2 normal.  No lower extremity edema.

## 2016-06-17 NOTE — ED Provider Notes (Addendum)
[facility] DEPT Provider Note   CSN: [id number] Arrival date & time: 06/17/16  0033  By signing my name below, I, Dora Sims, attest that this documentation has been prepared under the direction and in the presence of physician practitioner, Horton, Barbette Hair, MD. Electronically Signed: Dora Sims, Scribe. 06/17/2016. 12:57 AM.  History   Chief Complaint Chief Complaint  Patient presents with  . Chest Pain   The history is provided by the patient. No language interpreter was used.    HPI Comments: Tamara King is a 76 y.o. female with PMHx including HTN, HLD, stroke, and breast cancer who presents to the Emergency Department via EMS for evaluation of an episode of chest pain that began about one hour ago while at rest. Patient denies any chest pain currently but states she was experiencing a pressure-like sensation in her central chest. Patient took aspirin x4 PTA and did not receive any nitroglycerin en route to the hospital. She states she experienced a similar episode of chest pain one week ago and was evaluated at [facility] with a subsequent admission. She had a stress test performed during her hospital admission which She reports was unremarkable. No h/o DVT. She denies any h/o CAD but does have a documented history of coronary atherosclerosis of the native coronary artery. She has chronic bilateral lower leg swelling without any recent acute changes. Patient took her regular medications tonight as prescribed. She denies dyspnea, numbness/tingling, diaphoresis, or any other associated symptoms.  Of note, patient was recently taken off of amlodipine and now uses metoprolol for HTN. Review of most recent blood pressures in the office 120s over 60s.  Past Medical History:  Diagnosis Date  . Breast cancer ([place])    Adenocarcinoma  . Chronic edema   . Complication of anesthesia   . Coronary atherosclerosis of native coronary artery    Nonobstructive at catheterization  2003, LVEF normal   . Essential hypertension, benign   . Mixed hyperlipidemia   . PONV (postoperative nausea and vomiting)   . Renal artery stenosis ([facility])    BMS bilateally 2003, PTCA bilaterally 2005 with restenosis  . Sleep apnea    Stop Bang score of 4  . Stroke [facility])     Patient Active Problem List   Diagnosis Date Noted  . Headache(784.0) 06/09/2013  . Tinnitus 06/09/2013  . PALPITATIONS 01/30/2010  . CELLULITIS AND ABSCESS OF UPPER ARM AND FOREARM 09/15/2009  . ESSENTIAL HYPERTENSION, BENIGN 03/17/2009  . CAROTID ARTERY DISEASE 03/17/2009  . HYPERLIPIDEMIA-MIXED 11/07/2008  . CAD, NATIVE VESSEL 11/07/2008  . PVD 11/07/2008  . EDEMA 11/07/2008  . RENAL ARTERY ATHEROSCLEROSIS 07/29/2008    Past Surgical History:  Procedure Laterality Date  . APPENDECTOMY    . CATARACT EXTRACTION W/PHACO  03/29/2011   Procedure: CATARACT EXTRACTION PHACO AND INTRAOCULAR LENS PLACEMENT (IOC);  Surgeon: Tonny Branch, MD;  Location: [facility] ORS;  Service: Ophthalmology;  Laterality: Right;  CDE:12.82  . CATARACT EXTRACTION W/PHACO  04/16/2011   Procedure: CATARACT EXTRACTION PHACO AND INTRAOCULAR LENS PLACEMENT (IOC);  Surgeon: Tonny Branch, MD;  Location: [facility] ORS;  Service: Ophthalmology;  Laterality: Left;  CDE: 11.54  . CESAREAN SECTION     x2  . HEMORRHOID SURGERY    . Right breast lumpectomy    . ROTATOR CUFF REPAIR  2008    OB History    No data available       Home Medications    Prior to Admission medications   Medication Sig Start Date End  Date Taking? Authorizing Provider  acetaminophen (TYLENOL) 325 MG tablet Take 325 mg by mouth as needed. For pain    [provider]  aspirin EC 81 MG tablet Take 81 mg by mouth daily.    [provider]  clopidogrel (PLAVIX) 75 MG tablet Take 75 mg by mouth daily with breakfast.    [provider]  fluticasone (FLONASE) 50 MCG/ACT nasal spray Place 2 sprays into the nose daily as needed. For congestion    [provider]  furosemide (LASIX) 40 MG tablet Take 40 mg by mouth.    [provider]  gabapentin (NEURONTIN) 100 MG capsule Take 100 mg by mouth 2 (two) times daily.    [provider]  losartan (COZAAR) 100 MG tablet Take 100 mg by mouth daily.    [provider]  meloxicam (MOBIC) 7.5 MG tablet Take 7.5 mg by mouth daily.    [provider]  pantoprazole (PROTONIX) 40 MG tablet Take 40 mg by mouth daily.    [provider]  pravastatin (PRAVACHOL) 40 MG tablet Take 40 mg by mouth daily.    [provider]    Family History Family History  Problem Relation Age of Onset  . Anesthesia problems Neg Hx   . Hypotension Neg Hx   . Malignant hyperthermia Neg Hx   . Pseudochol deficiency Neg Hx   . Heart failure Mother   . Cancer Father     Social History Social History  Substance Use Topics  . Smoking status: Former Smoker    Packs/day: 1.00    Years: 35.00    Types: Cigarettes    Quit date: 01/22/1998  . Smokeless tobacco: Never Used     Comment: tobacco use - no  . Alcohol use No     Allergies   Codeine; Fish allergy; Other; Penicillins; Sulfa antibiotics; and Vancomycin   Review of Systems Review of Systems  Constitutional: Negative for diaphoresis.  Respiratory: Negative for shortness of breath.   Cardiovascular: Positive for chest pain and leg swelling (baseline, no acute changes).  Gastrointestinal: Negative for abdominal pain, nausea and vomiting.  Neurological: Negative for numbness.  All other systems reviewed and are negative.  Physical Exam Updated Vital Signs BP (!) 196/61 (BP Location: Left Arm)   Pulse (!) 57   Temp 97.6 F (36.4 C) (Oral)   Resp 14   Ht 5' 3.5" (1.613 m)   Wt 66.2 kg (146 lb)   SpO2 96%   BMI 25.46 kg/m   Physical Exam  Constitutional: She is oriented to person, place, and time. She appears well-developed and well-nourished. No distress.  HENT:  Head: Normocephalic and  atraumatic.  Cardiovascular: Normal rate, regular rhythm and normal heart sounds.   Pulmonary/Chest: Effort normal and breath sounds normal. No respiratory distress. She has no wheezes.  Abdominal: Soft. There is no tenderness.  Musculoskeletal: She exhibits edema.  Trace bilateral lower extremity edema  Neurological: She is alert and oriented to person, place, and time.  Skin: Skin is warm and dry.  Psychiatric: She has a normal mood and affect.  Nursing note and vitals reviewed.  ED Treatments / Results  Labs (all labs ordered are listed, but only abnormal results are displayed) Labs Reviewed  BASIC METABOLIC PANEL - Abnormal; Notable for the following:       Result Value   Potassium 2.9 (*)    Chloride 99 (*)    Glucose, Bld 132 (*)    Creatinine, Ser  1.28 (*)    GFR calc non Af Amer 40 (*)    GFR calc Af Amer 47 (*)    All other components within normal limits  D-DIMER, QUANTITATIVE (NOT AT [facility]) - Abnormal; Notable for the following:    D-Dimer, Quant 1.16 (*)    All other components within normal limits  CBC  I-STAT TROPOININ, ED    EKG  EKG Interpretation  Date/Time:  Sunday Jun 17 2016 00:42:27 EDT Ventricular Rate:  69 PR Interval:    QRS Duration: 89 QT Interval:  391 QTC Calculation: 419 R Axis:   12 Text Interpretation:  Sinus rhythm Nonspecific repol abnormality, diffuse leads T waves inversions V3 Nonspecific T wave changes Confirmed by Thayer Jew [phone]) on 06/17/2016 1:00:32 AM       Radiology Dg Chest 2 View  Result Date: 06/17/2016 CLINICAL DATA:  Epigastric pain after dinner this evening which resolved on the way to the hospital. Chest pain. EXAM: CHEST  2 VIEW COMPARISON:  05/12/2005 FINDINGS: Normal heart size and pulmonary vascularity. No focal airspace disease or consolidation in the lungs. No blunting of costophrenic angles. No pneumothorax. Mediastinal contours appear intact. Aortic calcifications. Degenerative changes in the spine and  shoulders. IMPRESSION: No active cardiopulmonary disease.  Aortic atherosclerosis. Electronically Signed   By: Lucienne Capers M.D.   On: 06/17/2016 01:40    Procedures Procedures (including critical care time)  DIAGNOSTIC STUDIES: Oxygen Saturation is 99% on RA, normal by my interpretation.    COORDINATION OF CARE: 12:53 AM Discussed treatment plan with pt at bedside and pt agreed to plan.  Medications Ordered in ED Medications  potassium chloride 10 mEq in 100 mL IVPB (10 mEq Intravenous New Bag/Given 06/17/16 0213)  hydrALAZINE (APRESOLINE) injection 10 mg (not administered)  metoprolol tartrate (LOPRESSOR) injection 5 mg (not administered)     Initial Impression / Assessment and Plan / ED Course  I have reviewed the triage vital signs and the nursing notes.  Pertinent labs & imaging results that were available during my care of the patient were reviewed by me and considered in my medical decision making (see chart for details).     Patient presents for chest pain. Improved upon my evaluation. She's currently chest pain-free. Reports recent workup including a negative stress test. Unfortunately, I cannot see this information. She does have risk factors for ACS. EKG with nonspecific T wave changes. Initial troponin negative. Potassium 2.9. This was replaced. D-dimer 1.16. Unfortunately, given creatinine, she is not a candidate for CT scan at this time. She will need a VQ scan. She's been chest pain-free while in the emergency room. Of note, she has been persistently hypertensive in the 200/90s.  She was given IV metoprolol. She could have some element of hypertensive urgency/emergency. Will admit for formal chest pain rule out, VQ scan, and blood pressure control. We'll need to obtain records from [facility]  Final Clinical Impressions(s) / ED Diagnoses   Final diagnoses:  Precordial pain  Positive D dimer  Hypertensive urgency  Hypokalemia    New Prescriptions New  Prescriptions   No medications on file   I personally performed the services described in this documentation, which was scribed in my presence. The recorded information has been reviewed and is accurate.    Merryl Hacker, MD 06/17/16 6226    Merryl Hacker, MD 06/17/16 [phone]

## 2016-06-17 NOTE — Progress Notes (Signed)
CT called and asked for a new IV for testing purposes, 18 or 20 gauge. Pt left AC has significant bruising and right arm restricted due to previous surgery. Consult to IV team placed. IV team at bedside now  Gary

## 2016-06-17 NOTE — Progress Notes (Signed)
  Echocardiogram 2D Echocardiogram has been performed.  Rehan Holness T Racine Erby 06/17/2016, 2:59 PM

## 2016-06-17 NOTE — Progress Notes (Signed)
ANTICOAGULATION CONSULT NOTE - Initial Consult  Pharmacy Consult for Lovenox Indication: R/O PE  Allergies  Allergen Reactions  . Sulfa Antibiotics Anaphylaxis and Swelling  . Codeine Nausea And Vomiting  . Fish Allergy Other (See Comments)    "sick as a dog"  . Other Other (See Comments)    Bologna - vomiting  . Penicillins     REACTION: rash  . Vancomycin Other (See Comments)    "drives me crazy"    Patient Measurements: Height: 5' 3.5" (161.3 cm) Weight: 146 lb (66.2 kg) IBW/kg (Calculated) : 53.55  Vital Signs: Temp: 97.6 F (36.4 C) (05/27 0042) Temp Source: Oral (05/27 0042) BP: 163/71 (05/27 0450) Pulse Rate: 68 (05/27 0450)  Labs:  Recent Labs  06/17/16 0056  HGB 14.2  HCT 42.0  PLT 214  CREATININE 1.28*    Estimated Creatinine Clearance: 36.2 mL/min (A) (by C-G formula based on SCr of 1.28 mg/dL (H)).   Medical History: Past Medical History:  Diagnosis Date  . Breast cancer ([place])    Adenocarcinoma  . Chronic edema   . Complication of anesthesia   . Coronary atherosclerosis of native coronary artery    Nonobstructive at catheterization 2003, LVEF normal   . Essential hypertension, benign   . Mixed hyperlipidemia   . PONV (postoperative nausea and vomiting)   . Renal artery stenosis (H[facility])    BMS bilateally 2003, PTCA bilaterally 2005 with restenosis  . Sleep apnea    Stop Bang score of 4  . Stroke [facility])     Medications:  Prescriptions Prior to Admission  Medication Sig Dispense Refill Last Dose  . acetaminophen (TYLENOL) 325 MG tablet Take 650 mg by mouth every 6 (six) hours as needed for mild pain. For pain   unk  . aspirin EC 81 MG tablet Take 81 mg by mouth daily.   06/16/2016 at Unknown time  . clopidogrel (PLAVIX) 75 MG tablet Take 75 mg by mouth daily with breakfast.   06/16/2016 at 0600  . fluticasone (FLONASE) 50 MCG/ACT nasal spray Place 2 sprays into the nose daily as needed. For congestion   unk  . furosemide (LASIX) 40 MG tablet  Take 40 mg by mouth every morning.    06/16/2016 at Unknown time  . metoprolol tartrate (LOPRESSOR) 25 MG tablet Take 25 mg by mouth 2 (two) times daily.   06/16/2016 at 0600  . pantoprazole (PROTONIX) 40 MG tablet Take 40 mg by mouth daily.   06/16/2016 at Unknown time  . pravastatin (PRAVACHOL) 40 MG tablet Take 40 mg by mouth daily.   06/16/2016 at Unknown time    Assessment: 76 y.o. female with chest pain, elevated D-Dimer, possible PE, for heparin  Goal of Therapy:  Full anticoagulation with Lovenox Monitor platelets by anticoagulation protocol: Yes   Plan:  Lovenox 60 mg SQ q12h  Caryl Pina 06/17/2016,5:15 AM

## 2016-06-17 NOTE — Progress Notes (Signed)
Pt troponin 0.04, no s/s, MD notified, will continue to monitor, Thanks Arvella Nigh RN.

## 2016-06-17 NOTE — ED Triage Notes (Signed)
Pt arrives via EMS from home with c/o epigastric pain that started after dinner this evening. Took 324 MG aspirin and pain resolved en route to the hospital. No IV access, no SL nitro pta. States she was admitted to [facility] hospital last week for the same. Pt continues to deny pain.

## 2016-06-18 ENCOUNTER — Encounter (HOSPITAL_COMMUNITY): Payer: Medicare Other

## 2016-06-18 DIAGNOSIS — N183 Chronic kidney disease, stage 3 (moderate): Secondary | ICD-10-CM | POA: Diagnosis not present

## 2016-06-18 DIAGNOSIS — I129 Hypertensive chronic kidney disease with stage 1 through stage 4 chronic kidney disease, or unspecified chronic kidney disease: Secondary | ICD-10-CM | POA: Diagnosis not present

## 2016-06-18 DIAGNOSIS — R7989 Other specified abnormal findings of blood chemistry: Secondary | ICD-10-CM

## 2016-06-18 DIAGNOSIS — R079 Chest pain, unspecified: Secondary | ICD-10-CM | POA: Diagnosis not present

## 2016-06-18 DIAGNOSIS — I2510 Atherosclerotic heart disease of native coronary artery without angina pectoris: Secondary | ICD-10-CM | POA: Diagnosis not present

## 2016-06-18 LAB — CBC
HEMATOCRIT: 41.7 % (ref 36.0–46.0)
HEMOGLOBIN: 13.9 g/dL (ref 12.0–15.0)
MCH: 30.7 pg (ref 26.0–34.0)
MCHC: 33.3 g/dL (ref 30.0–36.0)
MCV: 92.1 fL (ref 78.0–100.0)
Platelets: 198 10*3/uL (ref 150–400)
RBC: 4.53 MIL/uL (ref 3.87–5.11)
RDW: 13.2 % (ref 11.5–15.5)
WBC: 7 10*3/uL (ref 4.0–10.5)

## 2016-06-18 LAB — BASIC METABOLIC PANEL
ANION GAP: 6 (ref 5–15)
BUN: 11 mg/dL (ref 6–20)
CHLORIDE: 107 mmol/L (ref 101–111)
CO2: 28 mmol/L (ref 22–32)
Calcium: 9.1 mg/dL (ref 8.9–10.3)
Creatinine, Ser: 0.9 mg/dL (ref 0.44–1.00)
GFR calc non Af Amer: >60 mL/min (ref >60)
Glucose, Bld: 93 mg/dL (ref 65–99)
Potassium: 3.6 mmol/L (ref 3.5–5.1)
Sodium: 141 mmol/L (ref 135–145)

## 2016-06-18 MED ORDER — ENOXAPARIN SODIUM 40 MG/0.4ML ~~LOC~~ SOLN: 40.0000 mg | SUBCUTANEOUS | Status: DC

## 2016-06-18 MED ORDER — FUROSEMIDE 40 MG PO TABS
40.0000 mg | ORAL_TABLET | Freq: Every day | ORAL | Status: DC
  Administered 2016-06-18: 40 mg via ORAL
  Filled 2016-06-18: qty 1

## 2016-06-18 NOTE — Progress Notes (Signed)
Paged MD regarding pt discharge paperwork. AVS states pt has follow up [facility] on 5/28, pt states no one informed her and she is unsure if she can make it today.   MD returned page stated pt does not need to go to [facility] at 1300 5/28, MD states pt does not have DVT   Tamara King Tamara King

## 2016-06-18 NOTE — Progress Notes (Signed)
Pt refused bed alarm, pt aware of risks including fall and injury. Pt still refused. Will continue to monitor  Tamara King

## 2016-06-18 NOTE — Progress Notes (Signed)
Pt discharge education provided at bedside to pt and family. Pt IV discontinued, catheter intact and telemetry removed. Pt has all belongings including disahrge paperwork. Pt discharged via wheelchair with nurse staff.  Tamara King Leory Plowman

## 2016-06-18 NOTE — Discharge Summary (Signed)
Physician Discharge Summary  Tamara King QQI:[id number] DOB: 1940-07-02 DOA: 06/17/2016  PCP: Dione Housekeeper, MD  Admit date: 06/17/2016 Discharge date: 06/18/2016  Admitted From:home Disposition:home  Recommendations for Outpatient Follow-up:  1. Follow up with PCP in 1-2 weeks 2. Please obtain BMP/CBC in one week  Home Health:no Equipment/Devices:no Discharge Condition:stable CODE STATUS:full code Diet recommendation:heart healthy  Brief/Interim Summary: 76 -year-old female with history of hyperlipidemia, hypertension presented with chest pain mostly on the right side associated with shortness of breath. She was found to have mild elevation in troponin level. Also with elevated d-dimer. Initially patient was treated with Lovenox therapeutic dose for presumed pulmonary embolism which was ruled out by CT scan of chest. No acute pulmonary findings noticed. Patient's symptoms improving the hospital. Denied chest pain, shortness of breath, nausea, vomiting or abdominal pain. Endocrine improved. Echocardiogram with EF of 65-70% with normal wall motion.  Patient is already on Protonix for possible acid reflux. Her symptoms improved. She is already on aspirin, Plavix, statin and metoprolol. Blood pressure improved. Discharging home in a stable condition. Verbalized understanding that she will follow up with PCP in a week.  Discharge Diagnoses:  Principal Problem:   Chest pain Active Problems:   Hyperlipidemia   CAD, NATIVE VESSEL   Hypokalemia   Elevated d-dimer   Hypertensive urgency    Discharge Instructions  Discharge Instructions    Call MD for:  difficulty breathing, headache or visual disturbances    Complete by:  As directed    Call MD for:  extreme fatigue    Complete by:  As directed    Call MD for:  hives    Complete by:  As directed    Call MD for:  persistant dizziness or light-headedness    Complete by:  As directed    Call MD for:  persistant nausea and  vomiting    Complete by:  As directed    Call MD for:  severe uncontrolled pain    Complete by:  As directed    Call MD for:  temperature >100.4    Complete by:  As directed    Diet - low sodium heart healthy    Complete by:  As directed    Increase activity slowly    Complete by:  As directed      Allergies as of 06/18/2016      Reactions   Sulfa Antibiotics Anaphylaxis, Swelling   Codeine Nausea And Vomiting   Fish Allergy Other (See Comments)   "sick as a dog"   Other Other (See Comments)   Bologna - vomiting   Penicillins    REACTION: rash   Vancomycin Other (See Comments)   "drives me crazy"      Medication List    TAKE these medications   acetaminophen 325 MG tablet Commonly known as:  TYLENOL Take 650 mg by mouth every 6 (six) hours as needed for mild pain. For pain   aspirin EC 81 MG tablet Take 81 mg by mouth daily.   clopidogrel 75 MG tablet Commonly known as:  PLAVIX Take 75 mg by mouth daily with breakfast.   fluticasone 50 MCG/ACT nasal spray Commonly known as:  FLONASE Place 2 sprays into the nose daily as needed. For congestion   furosemide 40 MG tablet Commonly known as:  LASIX Take 40 mg by mouth every morning.   metoprolol tartrate 25 MG tablet Commonly known as:  LOPRESSOR Take 25 mg by mouth 2 (two) times daily.  pantoprazole 40 MG tablet Commonly known as:  PROTONIX Take 40 mg by mouth daily.   pravastatin 40 MG tablet Commonly known as:  PRAVACHOL Take 40 mg by mouth daily.      Follow-up Information    Dione Housekeeper, MD. Schedule an appointment as soon as possible for a visit in 1 week(s).   Specialty:  Family Medicine Contact information: [place] [address] [phone]          Allergies  Allergen Reactions  . Sulfa Antibiotics Anaphylaxis and Swelling  . Codeine Nausea And Vomiting  . Fish Allergy Other (See Comments)    "sick as a dog"  . Other Other (See Comments)    Bologna - vomiting   . Penicillins     REACTION: rash  . Vancomycin Other (See Comments)    "drives me crazy"    Consultations: None  Procedures/Studies: None  Subjective: Seen and examined at bedside. Denied headache, dizziness, nausea, vomiting, chest pain or shortness of breath. Reported feeling better.  Discharge Exam: Vitals:   06/18/16 0528 06/18/16 0851  BP: (!) 193/58 137/68  Pulse: 63 80  Resp: 20 17  Temp: 98.3 F (36.8 C)    Vitals:   06/17/16 2100 06/18/16 0044 06/18/16 0528 06/18/16 0851  BP: (!) 157/64 (!) 181/61 (!) 193/58 137/68  Pulse: 62 66 63 80  Resp: 20 20 20 17   Temp: 98.2 F (36.8 C) 98.3 F (36.8 C) 98.3 F (36.8 C)   TempSrc: Oral Oral Oral Oral  SpO2: 94% 94% 96% 98%  Weight:   63.6 kg (140 lb 3.2 oz)   Height:        General: Pt is alert, awake, not in acute distress Cardiovascular: RRR, S1/S2 +, no rubs, no gallops Respiratory: CTA bilaterally, no wheezing, no rhonchi Abdominal: Soft, NT, ND, bowel sounds + Extremities: no edema, no cyanosis    The results of significant diagnostics from this hospitalization (including imaging, microbiology, ancillary and laboratory) are listed below for reference.     Microbiology: No results found for this or any previous visit (from the past 240 hour(s)).   Labs: BNP (last 3 results) No results for input(s): BNP in the last 8760 hours. Basic Metabolic Panel:  Recent Labs Lab 06/17/16 0056 06/17/16 0853 06/18/16 0526  NA 139 141 141  K 2.9* 4.1 3.6  CL 99* 102 107  CO2 29 30 28   GLUCOSE 132* 122* 93  BUN 18 15 11   CREATININE 1.28* 1.06* 0.90  CALCIUM 9.1 9.3 9.1   Liver Function Tests: No results for input(s): AST, ALT, ALKPHOS, BILITOT, PROT, ALBUMIN in the last 168 hours. No results for input(s): LIPASE, AMYLASE in the last 168 hours. No results for input(s): AMMONIA in the last 168 hours. CBC:  Recent Labs Lab 06/17/16 0056 06/18/16 0526  WBC 8.8 7.0  HGB 14.2 13.9  HCT 42.0 41.7  MCV  89.2 92.1  PLT 214 198   Cardiac Enzymes:  Recent Labs Lab 06/17/16 0518 06/17/16 0725 06/17/16 1342  TROPONINI 0.04* 0.04* <0.03   BNP: Invalid input(s): POCBNP CBG: No results for input(s): GLUCAP in the last 168 hours. D-Dimer  Recent Labs  06/17/16 0057  DDIMER 1.16*   Hgb A1c No results for input(s): HGBA1C in the last 72 hours. Lipid Profile No results for input(s): CHOL, HDL, LDLCALC, TRIG, CHOLHDL, LDLDIRECT in the last 72 hours. Thyroid function studies No results for input(s): TSH, T4TOTAL, T3FREE, THYROIDAB in the last 72 hours.  Invalid  input(s): FREET3 Anemia work up No results for input(s): VITAMINB12, FOLATE, FERRITIN, TIBC, IRON, RETICCTPCT in the last 72 hours. Urinalysis    Component Value Date/Time   COLORURINE YELLOW 09/30/2006 1308   APPEARANCEUR CLEAR 09/30/2006 1308   LABSPEC 1.025 09/30/2006 1308   PHURINE 6.0 09/30/2006 1308   GLUCOSEU NEGATIVE 09/30/2006 1308   HGBUR NEGATIVE 09/30/2006 1308   BILIRUBINUR NEGATIVE 09/30/2006 1308   KETONESUR TRACE (A) 09/30/2006 1308   PROTEINUR NEGATIVE 09/30/2006 1308   UROBILINOGEN 1.0 09/30/2006 1308   NITRITE NEGATIVE 09/30/2006 1308   LEUKOCYTESUR  09/30/2006 1308    NEGATIVE MICROSCOPIC NOT DONE ON URINES WITH NEGATIVE PROTEIN, BLOOD, LEUKOCYTES, NITRITE, OR GLUCOSE <1000 mg/dL.   Sepsis Labs Invalid input(s): PROCALCITONIN,  WBC,  LACTICIDVEN Microbiology No results found for this or any previous visit (from the past 240 hour(s)).   Time coordinating discharge: Over 30 minutes  SIGNED:   Rosita Fire, MD  [facility] 06/18/2016, 11:22 AM  If 7PM-7AM, please contact night-coverage [email or web address] Password [email or web address]

## 2016-06-18 NOTE — Care Management Obs Status (Signed)
[facility] NOTIFICATION   Patient Details  Name: Tamara King MRN: [id number] Date of Birth: 10-24-1940   Medicare Observation Status Notification Given:    Pt for d/c to home today.   Alycen Mack, Rory Percy, RN 06/18/2016, 11:39 AM

## 2016-09-18 DIAGNOSIS — C50919 Malignant neoplasm of unspecified site of unspecified female breast: Secondary | ICD-10-CM | POA: Insufficient documentation

## 2016-09-18 DIAGNOSIS — K811 Chronic cholecystitis: Secondary | ICD-10-CM | POA: Insufficient documentation

## 2016-09-18 DIAGNOSIS — I889 Nonspecific lymphadenitis, unspecified: Secondary | ICD-10-CM | POA: Insufficient documentation

## 2016-09-18 DIAGNOSIS — K8050 Calculus of bile duct without cholangitis or cholecystitis without obstruction: Secondary | ICD-10-CM | POA: Insufficient documentation

## 2017-10-22 DIAGNOSIS — I6522 Occlusion and stenosis of left carotid artery: Secondary | ICD-10-CM

## 2017-10-22 HISTORY — DX: Occlusion and stenosis of left carotid artery: I65.22

## 2017-11-04 ENCOUNTER — Ambulatory Visit (INDEPENDENT_AMBULATORY_CARE_PROVIDER_SITE_OTHER): Payer: Medicare Other | Admitting: Vascular Surgery

## 2017-11-04 ENCOUNTER — Encounter: Payer: Self-pay | Admitting: Vascular Surgery

## 2017-11-04 VITALS — BP 197/74 | HR 66 | Temp 98.2°F | Resp 18 | Ht 63.5 in | Wt 142.8 lb

## 2017-11-04 DIAGNOSIS — I6522 Occlusion and stenosis of left carotid artery: Secondary | ICD-10-CM | POA: Diagnosis not present

## 2017-11-04 NOTE — Progress Notes (Signed)
Vascular and Vein Specialist of [place]  Patient name: Tamara King MRN: [id number] DOB: March 18, 1940 Sex: female  REASON FOR CONSULT: Evaluation of asymptomatic left internal carotid artery stenosis  Seen today in our [place] office   HPI: Tamara King is a 77 y.o. female, who is seen today for discussion of recent carotid duplex showing moderate to severe left internal carotid artery stenosis.  She has a history and 2000 of having a right brain event with left-sided weakness.  She underwent a right carotid endarterectomy in 2000 and is made very nice recovery.  This was in [facility].  Had no new neurologic focal deficits.  She does report that 3 or 4 times per day she has a feeling of generalized weakness but no focal issues.  This can occur with some nausea at the same time and then resolves completely.  Is no history of cardiac disease.  She quit smoking in 2000 at the time of her initial stroke.  He does have a history of bilateral renal arteries stents for renal artery stenosis in 2003 in 2005.  She does have swelling in both lower extremities right greater than left.  She is attempted compression garments in the past but is unable to tolerate these.  Past Medical History:  Diagnosis Date  . Breast cancer ([place])    Adenocarcinoma  . Chronic edema   . Complication of anesthesia   . Coronary atherosclerosis of native coronary artery    Nonobstructive at catheterization 2003, LVEF normal   . Essential hypertension, benign   . Mixed hyperlipidemia   . PONV (postoperative nausea and vomiting)   . Renal artery stenosis (HCC)    BMS bilateally 2003, PTCA bilaterally 2005 with restenosis  . Sleep apnea    Stop Bang score of 4  . Stroke [facility])     Family History  Problem Relation Age of Onset  . Anesthesia problems Neg Hx   . Hypotension Neg Hx   . Malignant hyperthermia Neg Hx   . Pseudochol deficiency Neg Hx   .  Heart failure Mother   . Cancer Father     SOCIAL HISTORY: Social History   Socioeconomic History  . Marital status: Widowed    Spouse name: Not on file  . Number of children: 2  . Years of education: 12th  . Highest education level: Not on file  Occupational History    Employer: RETIRED    Comment: n/a  Social Needs  . Financial resource strain: Not on file  . Food insecurity:    Worry: Not on file    Inability: Not on file  . Transportation needs:    Medical: Not on file    Non-medical: Not on file  Tobacco Use  . Smoking status: Former Smoker    Packs/day: 1.00    Years: 35.00    Pack years: 35.00    Types: Cigarettes    Last attempt to quit: 01/22/1998    Years since quitting: 19.7  . Smokeless tobacco: Never Used  . Tobacco comment: tobacco use - no  Substance and Sexual Activity  . Alcohol use: No  . Drug use: No  . Sexual activity: Not on file  Lifestyle  . Physical activity:    Days per week: Not on file    Minutes per session: Not on file  . Stress: Not on file  Relationships  . Social connections:    Talks on phone: Not on file    Gets  together: Not on file    Attends religious service: Not on file    Active member of club or organization: Not on file    Attends meetings of clubs or organizations: Not on file    Relationship status: Not on file  . Intimate partner violence:    Fear of current or ex partner: Not on file    Emotionally abused: Not on file    Physically abused: Not on file    Forced sexual activity: Not on file  Other Topics Concern  . Not on file  Social History Narrative   Patient lives at home with family.   Caffeine use; 2-5 cups daily    Allergies  Allergen Reactions  . Sulfa Antibiotics Anaphylaxis and Swelling  . Codeine Nausea And Vomiting  . Fish Allergy Other (See Comments)    "sick as a dog"  . Losartan   . Other Other (See Comments)    Bologna - vomiting  . Penicillins     REACTION: rash  . Vancomycin Other  (See Comments)    "drives me crazy"    Current Outpatient Medications  Medication Sig Dispense Refill  . cetirizine (ZYRTEC) 10 MG tablet Take 10 mg by mouth daily.    . ranitidine (ZANTAC) 150 MG capsule Take 150 mg by mouth 2 (two) times daily.    Marland Kitchen acetaminophen (TYLENOL) 325 MG tablet Take 650 mg by mouth every 6 (six) hours as needed for mild pain. For pain    . aspirin EC 81 MG tablet Take 81 mg by mouth daily.    . clopidogrel (PLAVIX) 75 MG tablet Take 75 mg by mouth daily with breakfast.    . fluticasone (FLONASE) 50 MCG/ACT nasal spray Place 2 sprays into the nose daily as needed. For congestion    . furosemide (LASIX) 40 MG tablet Take 40 mg by mouth every morning.     . metoprolol tartrate (LOPRESSOR) 25 MG tablet Take 25 mg by mouth 2 (two) times daily.    . pantoprazole (PROTONIX) 40 MG tablet Take 40 mg by mouth daily.    . pravastatin (PRAVACHOL) 40 MG tablet Take 40 mg by mouth daily.     No current facility-administered medications for this visit.     REVIEW OF SYSTEMS:  [X]  denotes positive finding, [ ]  denotes negative finding Cardiac  Comments:  Chest pain or chest pressure:    Shortness of breath upon exertion: x   Short of breath when lying flat:    Irregular heart rhythm:        Vascular    Pain in calf, thigh, or hip brought on by ambulation: x   Pain in feet at night that wakes you up from your sleep:  x   Blood clot in your veins:    Leg swelling:  x       Pulmonary    Oxygen at home:    Productive cough:     Wheezing:         Neurologic    Sudden weakness in arms or legs:  x   Sudden numbness in arms or legs:     Sudden onset of difficulty speaking or slurred speech:    Temporary loss of vision in one eye:     Problems with dizziness:         Gastrointestinal    Blood in stool:     Vomited blood:         Genitourinary    Burning when  urinating:     Blood in urine:        Psychiatric    Major depression:         Hematologic      Bleeding problems:    Problems with blood clotting too easily:        Skin    Rashes or ulcers:        Constitutional    Fever or chills:      PHYSICAL EXAM: Vitals:   11/04/17 1119 11/04/17 1124  BP: (!) 177/69 (!) 197/74  Pulse: 66 66  Resp: 18   Temp: 98.2 F (36.8 C)   TempSrc: Temporal   Weight: 142 lb 12.8 oz (64.8 kg)   Height: 5' 3.5" (1.613 m)     GENERAL: The patient is a well-nourished female, in no acute distress. The vital signs are documented above. CARDIOVASCULAR: Well-healed right carotid incision.  She does have a bruit on the right and soft on the left.  She does have varicosities in her right medial thigh and medial calf with some swelling versus left leg PULMONARY: There is good air exchange  ABDOMEN: Soft and non-tender  MUSCULOSKELETAL: There are no major deformities or cyanosis. NEUROLOGIC: No focal weakness or paresthesias are detected. SKIN: There are no ulcers or rashes noted. PSYCHIATRIC: The patient has a normal affect.  DATA:  Have her carotid duplex for review from 10/22/2017.  This shows widely patent right endarterectomy.  She does have at least 60 to 70% stenosis but potentially higher level of stenosis in her left carotid.  MEDICAL ISSUES: I discussed this at length with the patient.  I explained that this in all likelihood has no relationship to the generalized weakness that she is feeling several times a day.  Duplex imaging suggested a higher level of stenosis in velocities.  I have recommended a CT angiogram for further evaluation.  We will obtain this at [facility] and see her back for discussion.  Discussed her leg swelling.  Explained that this certainly would be something that we would keep in the back of her mind.  She may be a potential for ablation of her right great saphenous vein for some symptom relief and her significant swelling in her right leg.  Will address this at her convenience in the future   Rosetta Posner, MD  [facility] V[facility] and Vein Specialists of [facility] Tel [phone] Pager [phone]

## 2017-11-05 ENCOUNTER — Other Ambulatory Visit: Payer: Self-pay

## 2017-11-05 DIAGNOSIS — I6522 Occlusion and stenosis of left carotid artery: Secondary | ICD-10-CM

## 2017-11-11 ENCOUNTER — Ambulatory Visit (HOSPITAL_COMMUNITY)
Admission: RE | Admit: 2017-11-11 | Discharge: 2017-11-11 | Disposition: A | Discharge status: Home / Self Care | Payer: Medicare Other | Source: Ambulatory Visit | Attending: Vascular Surgery | Admitting: Vascular Surgery

## 2017-11-11 DIAGNOSIS — I6522 Occlusion and stenosis of left carotid artery: Secondary | ICD-10-CM | POA: Diagnosis present

## 2017-11-11 DIAGNOSIS — J432 Centrilobular emphysema: Secondary | ICD-10-CM | POA: Diagnosis not present

## 2017-11-11 DIAGNOSIS — G9389 Other specified disorders of brain: Secondary | ICD-10-CM | POA: Diagnosis not present

## 2017-11-11 DIAGNOSIS — I700 Atherosclerosis of aorta: Secondary | ICD-10-CM | POA: Insufficient documentation

## 2017-11-11 LAB — POCT I-STAT CREATININE: CREATININE: 1.3 mg/dL — AB (ref 0.44–1.00)

## 2017-11-11 MED ORDER — IOPAMIDOL (ISOVUE-300) INJECTION 61%: 100.0000 mL | Freq: Once | INTRAVENOUS | Status: DC | PRN

## 2017-11-11 MED ORDER — IOPAMIDOL (ISOVUE-370) INJECTION 76%
100.0000 mL | Freq: Once | INTRAVENOUS | Status: AC | PRN
  Administered 2017-11-11: 60 mL via INTRAVENOUS

## 2017-11-18 ENCOUNTER — Ambulatory Visit (INDEPENDENT_AMBULATORY_CARE_PROVIDER_SITE_OTHER): Payer: Medicare Other | Admitting: Vascular Surgery

## 2017-11-18 ENCOUNTER — Encounter: Payer: Self-pay | Admitting: Vascular Surgery

## 2017-11-18 VITALS — BP 191/84 | HR 66 | Temp 97.5°F | Resp 16 | Ht 63.5 in | Wt 143.4 lb

## 2017-11-18 DIAGNOSIS — I6522 Occlusion and stenosis of left carotid artery: Secondary | ICD-10-CM

## 2017-11-18 NOTE — Progress Notes (Signed)
Vascular and Vein Specialist of [place]  Patient name: Tamara King MRN: [id number] DOB: December 14, 1940 Sex: female  REASON FOR VISIT: Follow-up CT angiogram of head and neck  Seen today in our [place] office  HPI: Tamara King is a 77 y.o. female here today for follow-up.  This is follow-up from recent outpatient evaluation I had.  She has a remote history of right carotid endarterectomy in 2000.  Recent duplex at patient facility is suggested potential high-grade stenosis recurrent in her right and also had unusual flow in the left carotid system.  She underwent CT angiogram for further evaluation of the discs and is here today for discussion.  She denies any new neurologic deficits.  She has had no focal issues.  She does continue to report an episode of 3-4 times per day will become unsteady on her feet and actually has to hold onto a door or piece of furniture to stable herself.  This is not related to any particular activities.  Past Medical History:  Diagnosis Date  . Breast cancer ([place])    Adenocarcinoma  . Chronic edema   . Complication of anesthesia   . Coronary atherosclerosis of native coronary artery    Nonobstructive at catheterization 2003, LVEF normal   . Essential hypertension, benign   . Mixed hyperlipidemia   . PONV (postoperative nausea and vomiting)   . Renal artery stenosis (HCC)    BMS bilateally 2003, PTCA bilaterally 2005 with restenosis  . Sleep apnea    Stop Bang score of 4  . Stroke [facility])     Family History  Problem Relation Age of Onset  . Anesthesia problems Neg Hx   . Hypotension Neg Hx   . Malignant hyperthermia Neg Hx   . Pseudochol deficiency Neg Hx   . Heart failure Mother   . Cancer Father     SOCIAL HISTORY: Social History   Tobacco Use  . Smoking status: Former Smoker    Packs/day: 1.00    Years: 35.00    Pack years: 35.00    Types: Cigarettes    Last attempt to quit: 01/22/1998    Years  since quitting: 19.8  . Smokeless tobacco: Never Used  . Tobacco comment: tobacco use - no  Substance Use Topics  . Alcohol use: No    Allergies  Allergen Reactions  . Sulfa Antibiotics Anaphylaxis and Swelling  . Codeine Nausea And Vomiting  . Fish Allergy Other (See Comments)    "sick as a dog"  . Losartan   . Other Other (See Comments)    Bologna - vomiting  . Penicillins     REACTION: rash  . Vancomycin Other (See Comments)    "drives me crazy"    Current Outpatient Medications  Medication Sig Dispense Refill  . acetaminophen (TYLENOL) 325 MG tablet Take 650 mg by mouth every 6 (six) hours as needed for mild pain. For pain    . aspirin EC 81 MG tablet Take 81 mg by mouth daily.    . cetirizine (ZYRTEC) 10 MG tablet Take 10 mg by mouth daily.    . clopidogrel (PLAVIX) 75 MG tablet Take 75 mg by mouth daily with breakfast.    . fluticasone (FLONASE) 50 MCG/ACT nasal spray Place 2 sprays into the nose daily as needed. For congestion    . furosemide (LASIX) 40 MG tablet Take 40 mg by mouth every morning.     . metoprolol tartrate (LOPRESSOR) 25 MG tablet Take 25 mg  by mouth 2 (two) times daily.    . pantoprazole (PROTONIX) 40 MG tablet Take 40 mg by mouth daily.    . pravastatin (PRAVACHOL) 40 MG tablet Take 40 mg by mouth daily.    . ranitidine (ZANTAC) 150 MG capsule Take 150 mg by mouth 2 (two) times daily.     No current facility-administered medications for this visit.     REVIEW OF SYSTEMS:  [X]  denotes positive finding, [ ]  denotes negative finding Cardiac  Comments:  Chest pain or chest pressure:    Shortness of breath upon exertion:    Short of breath when lying flat:    Irregular heart rhythm:        Vascular    Pain in calf, thigh, or hip brought on by ambulation:    Pain in feet at night that wakes you up from your sleep:     Blood clot in your veins:    Leg swelling:           PHYSICAL EXAM: Vitals:   11/18/17 0906  BP: (!) 191/84  Pulse: 66    Resp: 16  Temp: (!) 97.5 F (36.4 C)  TempSrc: Temporal  Weight: 143 lb 6.4 oz (65 kg)  Height: 5' 3.5" (1.613 m)    GENERAL: The patient is a well-nourished female, in no acute distress. The vital signs are documented above. CARDIOVASCULAR: No change with well-healed right incision and bruits bilaterally PULMONARY: There is good air exchange  MUSCULOSKELETAL: There are no major deformities or cyanosis. NEUROLOGIC: No focal weakness or paresthesias are detected. SKIN: There are no ulcers or rashes noted. PSYCHIATRIC: The patient has a normal affect.  DATA:  CT angiogram reveals occlusion of her left common carotid and internal carotid artery extending into the intracranial segment of her internal carotid artery.  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  MEDICAL ISSUES: I discussed these findings in detail with the patient.  I explained that it is conceivable that she occluded her left carotid artery between the time of her duplex in [place] October and the CT scan.  I suspect that this is extremely unlikely.  I feel that in all likelihood she did have a carotid occlusion that was misinterpreted by the ultrasonographer.  Fortunately does remain asymptomatic from this.  Not feel that this anatomy puts her at any particular risk for stroke.  I am concerned that she very well may be having vertebrobasilar insufficiency as the cause of her unsteadiness.  She has no flow antegrade in her internal carotid on the left and has a high-grade stenosis in her innominate artery causing hypoperfusion in her right carotid and vertebral artery and also stenosis in her left vertebral artery takeoff.  I have recommended arteriography for further evaluation.  Explained that we would make recommendations regarding treatment if any pending the studies.  Schedule this at her convenience at [facility].  She understands that 1 of my  partners will be doing the procedure.    Rosetta Posner, MD FACS Vascular and Vein Specialists of [facility] Tel [phone] Pager [phone]

## 2017-11-19 ENCOUNTER — Other Ambulatory Visit: Payer: Self-pay | Admitting: *Deleted

## 2017-11-19 NOTE — Progress Notes (Signed)
Call to patient and instructed to be at [facility] admitting at 9:30 am on 11/27/2017 for procedure. NPO past MN night prior and must have a driver and caregiver for discharge. Take Metoprolol and Lasix am of with sips of water hold all other medication until after this procedure. Verbalized understanding.

## 2017-11-27 ENCOUNTER — Other Ambulatory Visit: Payer: Self-pay

## 2017-11-27 ENCOUNTER — Observation Stay (HOSPITAL_COMMUNITY)
Admission: RE | Admit: 2017-11-27 | Discharge: 2017-11-28 | Disposition: A | Discharge status: Home / Self Care | Payer: Medicare Other | Source: Ambulatory Visit | Attending: Vascular Surgery | Admitting: Vascular Surgery

## 2017-11-27 ENCOUNTER — Encounter (HOSPITAL_COMMUNITY)
Admission: RE | Disposition: A | Discharge status: Home / Self Care | Payer: Self-pay | Source: Ambulatory Visit | Attending: Vascular Surgery

## 2017-11-27 DIAGNOSIS — Z79899 Other long term (current) drug therapy: Secondary | ICD-10-CM | POA: Insufficient documentation

## 2017-11-27 DIAGNOSIS — Z888 Allergy status to other drugs, medicaments and biological substances status: Secondary | ICD-10-CM | POA: Insufficient documentation

## 2017-11-27 DIAGNOSIS — Z880 Allergy status to penicillin: Secondary | ICD-10-CM | POA: Insufficient documentation

## 2017-11-27 DIAGNOSIS — Z91018 Allergy to other foods: Secondary | ICD-10-CM | POA: Diagnosis not present

## 2017-11-27 DIAGNOSIS — Z8673 Personal history of transient ischemic attack (TIA), and cerebral infarction without residual deficits: Secondary | ICD-10-CM | POA: Diagnosis not present

## 2017-11-27 DIAGNOSIS — Z853 Personal history of malignant neoplasm of breast: Secondary | ICD-10-CM | POA: Diagnosis not present

## 2017-11-27 DIAGNOSIS — E782 Mixed hyperlipidemia: Secondary | ICD-10-CM | POA: Insufficient documentation

## 2017-11-27 DIAGNOSIS — R609 Edema, unspecified: Secondary | ICD-10-CM | POA: Insufficient documentation

## 2017-11-27 DIAGNOSIS — Z882 Allergy status to sulfonamides status: Secondary | ICD-10-CM | POA: Diagnosis not present

## 2017-11-27 DIAGNOSIS — Z885 Allergy status to narcotic agent status: Secondary | ICD-10-CM | POA: Diagnosis not present

## 2017-11-27 DIAGNOSIS — R110 Nausea: Secondary | ICD-10-CM | POA: Diagnosis present

## 2017-11-27 DIAGNOSIS — Z87891 Personal history of nicotine dependence: Secondary | ICD-10-CM | POA: Insufficient documentation

## 2017-11-27 DIAGNOSIS — Z809 Family history of malignant neoplasm, unspecified: Secondary | ICD-10-CM | POA: Insufficient documentation

## 2017-11-27 DIAGNOSIS — I2510 Atherosclerotic heart disease of native coronary artery without angina pectoris: Secondary | ICD-10-CM | POA: Diagnosis not present

## 2017-11-27 DIAGNOSIS — Z8249 Family history of ischemic heart disease and other diseases of the circulatory system: Secondary | ICD-10-CM | POA: Insufficient documentation

## 2017-11-27 DIAGNOSIS — Z7902 Long term (current) use of antithrombotics/antiplatelets: Secondary | ICD-10-CM | POA: Insufficient documentation

## 2017-11-27 DIAGNOSIS — Z7982 Long term (current) use of aspirin: Secondary | ICD-10-CM | POA: Diagnosis not present

## 2017-11-27 DIAGNOSIS — I10 Essential (primary) hypertension: Secondary | ICD-10-CM | POA: Diagnosis not present

## 2017-11-27 DIAGNOSIS — I6522 Occlusion and stenosis of left carotid artery: Secondary | ICD-10-CM

## 2017-11-27 DIAGNOSIS — R42 Dizziness and giddiness: Secondary | ICD-10-CM | POA: Diagnosis not present

## 2017-11-27 DIAGNOSIS — Z881 Allergy status to other antibiotic agents status: Secondary | ICD-10-CM | POA: Diagnosis not present

## 2017-11-27 DIAGNOSIS — I6523 Occlusion and stenosis of bilateral carotid arteries: Principal | ICD-10-CM | POA: Insufficient documentation

## 2017-11-27 DIAGNOSIS — I771 Stricture of artery: Secondary | ICD-10-CM | POA: Insufficient documentation

## 2017-11-27 DIAGNOSIS — G4730 Sleep apnea, unspecified: Secondary | ICD-10-CM | POA: Diagnosis not present

## 2017-11-27 DIAGNOSIS — T8859XA Other complications of anesthesia, initial encounter: Secondary | ICD-10-CM

## 2017-11-27 DIAGNOSIS — Z91013 Allergy to seafood: Secondary | ICD-10-CM | POA: Insufficient documentation

## 2017-11-27 HISTORY — PX: UPPER EXTREMITY ANGIOGRAPHY: CATH118270

## 2017-11-27 LAB — POCT I-STAT, CHEM 8
BUN: 21 mg/dL (ref 8–23)
CREATININE: 1.1 mg/dL — AB (ref 0.44–1.00)
Calcium, Ion: 1.15 mmol/L (ref 1.15–1.40)
Chloride: 104 mmol/L (ref 98–111)
GLUCOSE: 113 mg/dL — AB (ref 70–99)
HCT: 43 % (ref 36.0–46.0)
Hemoglobin: 14.6 g/dL (ref 12.0–15.0)
POTASSIUM: 3.5 mmol/L (ref 3.5–5.1)
Sodium: 141 mmol/L (ref 135–145)
TCO2: 29 mmol/L (ref 22–32)

## 2017-11-27 SURGERY — UPPER EXTREMITY ANGIOGRAPHY
Anesthesia: LOCAL

## 2017-11-27 MED ORDER — FENTANYL CITRATE (PF) 100 MCG/2ML IJ SOLN
INTRAMUSCULAR | Status: DC | PRN
  Administered 2017-11-27: 25 ug via INTRAVENOUS

## 2017-11-27 MED ORDER — PROMETHAZINE HCL 25 MG/ML IJ SOLN
12.5000 mg | Freq: Once | INTRAMUSCULAR | Status: AC
  Administered 2017-11-27: 12.5 mg via INTRAVENOUS

## 2017-11-27 MED ORDER — LIDOCAINE HCL (PF) 1 % IJ SOLN
INTRAMUSCULAR | Status: DC | PRN
  Administered 2017-11-27: 20 mL

## 2017-11-27 MED ORDER — IODIXANOL 320 MG/ML IV SOLN
INTRAVENOUS | Status: DC | PRN
  Administered 2017-11-27: 30 mL via INTRA_ARTERIAL

## 2017-11-27 MED ORDER — LABETALOL HCL 5 MG/ML IV SOLN
10.0000 mg | INTRAVENOUS | Status: DC | PRN
  Filled 2017-11-27: qty 4

## 2017-11-27 MED ORDER — SODIUM CHLORIDE 0.9 % WEIGHT BASED INFUSION: 1.0000 mL/kg/h | INTRAVENOUS | Status: AC

## 2017-11-27 MED ORDER — HYDRALAZINE HCL 20 MG/ML IJ SOLN
INTRAMUSCULAR | Status: DC | PRN
  Administered 2017-11-27 (×2): 10 mg via INTRAVENOUS

## 2017-11-27 MED ORDER — HEPARIN (PORCINE) IN NACL 1000-0.9 UT/500ML-% IV SOLN
INTRAVENOUS | Status: DC | PRN
  Administered 2017-11-27 (×2): 500 mL

## 2017-11-27 MED ORDER — SODIUM CHLORIDE 0.9% FLUSH: 3.0000 mL | INTRAVENOUS | Status: DC | PRN

## 2017-11-27 MED ORDER — HYDRALAZINE HCL 20 MG/ML IJ SOLN
INTRAMUSCULAR | Status: AC
  Filled 2017-11-27: qty 1

## 2017-11-27 MED ORDER — LIDOCAINE HCL (PF) 1 % IJ SOLN
INTRAMUSCULAR | Status: AC
  Filled 2017-11-27: qty 30

## 2017-11-27 MED ORDER — MIDAZOLAM HCL 2 MG/2ML IJ SOLN
INTRAMUSCULAR | Status: AC
  Filled 2017-11-27: qty 2

## 2017-11-27 MED ORDER — ACETAMINOPHEN 325 MG PO TABS: 650.0000 mg | ORAL_TABLET | ORAL | Status: DC | PRN

## 2017-11-27 MED ORDER — ONDANSETRON HCL 4 MG/2ML IJ SOLN
4.0000 mg | Freq: Four times a day (QID) | INTRAMUSCULAR | Status: DC | PRN
  Administered 2017-11-27: 4 mg via INTRAVENOUS

## 2017-11-27 MED ORDER — HYDRALAZINE HCL 20 MG/ML IJ SOLN: 5.0000 mg | INTRAMUSCULAR | Status: DC | PRN

## 2017-11-27 MED ORDER — FENTANYL CITRATE (PF) 100 MCG/2ML IJ SOLN
INTRAMUSCULAR | Status: AC
  Filled 2017-11-27: qty 2

## 2017-11-27 MED ORDER — MIDAZOLAM HCL 2 MG/2ML IJ SOLN
INTRAMUSCULAR | Status: DC | PRN
  Administered 2017-11-27: 1 mg via INTRAVENOUS

## 2017-11-27 MED ORDER — SODIUM CHLORIDE 0.9% FLUSH
3.0000 mL | Freq: Two times a day (BID) | INTRAVENOUS | Status: DC
  Administered 2017-11-28: 3 mL via INTRAVENOUS

## 2017-11-27 MED ORDER — SODIUM CHLORIDE 0.9 % IV SOLN
INTRAVENOUS | Status: DC
  Administered 2017-11-27: 10:00:00 via INTRAVENOUS

## 2017-11-27 MED ORDER — SODIUM CHLORIDE 0.9 % IV SOLN: 250.0000 mL | INTRAVENOUS | Status: DC | PRN

## 2017-11-27 MED ORDER — ONDANSETRON HCL 4 MG/2ML IJ SOLN
INTRAMUSCULAR | Status: AC
  Filled 2017-11-27: qty 2

## 2017-11-27 MED ORDER — HEPARIN (PORCINE) IN NACL 1000-0.9 UT/500ML-% IV SOLN
INTRAVENOUS | Status: AC
  Filled 2017-11-27: qty 1000

## 2017-11-27 SURGICAL SUPPLY — 15 items
CATH ANGIO 5F BER2 100CM (CATHETERS) ×1 IMPLANT
CATH ANGIO 5F PIGTAIL 100CM (CATHETERS) ×1 IMPLANT
DEVICE CLOSURE MYNXGRIP 5F (Vascular Products) ×1 IMPLANT
GUIDEWIRE ANGLED .035X260CM (WIRE) ×1 IMPLANT
KIT MICROPUNCTURE NIT STIFF (SHEATH) ×1 IMPLANT
KIT PV (KITS) ×2 IMPLANT
SHEATH PINNACLE 5F 10CM (SHEATH) ×1 IMPLANT
SHEATH PROBE COVER 6X72 (BAG) ×1 IMPLANT
STOPCOCK MORSE 400PSI 3WAY (MISCELLANEOUS) ×1 IMPLANT
SYR MEDRAD MARK V 150ML (SYRINGE) ×2 IMPLANT
TRANSDUCER W/STOPCOCK (MISCELLANEOUS) ×2 IMPLANT
TRAY PV CATH (CUSTOM PROCEDURE TRAY) ×2 IMPLANT
TUBING CIL FLEX 10 FLL-RA (TUBING) ×1 IMPLANT
WIRE BENTSON .035X145CM (WIRE) ×1 IMPLANT
WIRE SPARTACORE .014X300CM (WIRE) ×1 IMPLANT

## 2017-11-27 NOTE — Discharge Instructions (Signed)

## 2017-11-27 NOTE — Progress Notes (Signed)
Dr Carlis Abbott in and order to admit obs

## 2017-11-27 NOTE — Progress Notes (Signed)
Transferred to 6-n-18 via w/c and report to RN for 6-N-18

## 2017-11-27 NOTE — Op Note (Signed)
Patient name: Tamara King MRN: [id number] DOB: [birth year] Sex: female  11/27/2017 Pre-operative Diagnosis: Dizziness with concern for vertebrobasilar insufficiency Post-operative diagnosis:  Same Surgeon:  Marty Heck, MD Procedure Performed: 1.  Ultrasound guided access of the right common femoral artery 2.  Limited arch aortogram 3.  Cannulation of the left subclavian artery with pullback pressures 4.  Mynx closure of the right common femoral artery 5.  42 minutes of monitored moderate conscious sedation time  Indications: Patient is a 77 year old female who previously underwent right carotid endarterectomy by Dr. Donnetta Hutching years ago.  She was recently seen in clinic for further follow-up and CTA was obtained given concern for recurrent carotid stenosis on the right and abnormal flow in the left carotid as well.  Ultimately the CTA found that her left carotid was occluded and her right carotid after endarterectomy was widely patent.  Moreover there was finding of a innominate stenosis at the ostium as well as a left subclavian stenosis.  Given concern for possible vertebrobasilar insufficiency as cause of her dizziness we recommended arch aortogram with plans to intervene on her left subclavian artery to try and improve flow of the left vertebral artery.  Findings: Limited arch aortogram showed a approximate 50% calcified stenosis of the innominate artery with a widely patent right common carotid and internal carotid artery and a patent right subclavian and patent right vertebral artery.  There was no evidence of significant stenosis of the proximal left subclavian artery as noted on CT scan.  We also cannulated the left subclavian artery and performed a pullback pressure that revealed no significant change with a pressure of 206 mmHg in the distal subclavian artery and the same pressure in the thoracic aorta.  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  Discussed the case with Dr. Donnetta Hutching and we did not feel the risks of innominate intervention outweighed the benefits given she feels her dizziness is manageable and we would be performing stent angioplasty of a highly calcified lesion with the right carotid and right vertebral artery as her only cerebral inflow.   Procedure: The patient was identified in the holding room and taken to [facility] lab 8.  She was placed on the procedure table and her bilateral groins were prepped and draped in usual sterile fashion.  Initially used ultrasound guidance to access her right common femoral artery with micro access sheath.  Then placed the Bentson wire and exchanged for a short 5 [place] sheath.  At this point in time the patient was given 6000 units of IV heparin.  I used a pigtail catheter as well as a Bentson wire and advanced pigtail catheter into her ascending aorta and using steep LAO for arch aortogram.  This revealed an approximate 50% stenosis of her innominate artery at the ostium that was heavily calcified with filling of the right subclavian and right common and internal carotid artery as well as the right vertebral artery.  Her left common carotid and internal carotid artery were occluded as previously noted on CT.  The left subclavian artery did not have any evidence of significant stenosis at the ostium as suspected on her CT scan.  Just to confirm we did place a wire up the left subclavian using Bernstein 2 catheter and [employer] wire.  After first degree cannulation and perform pullback pressures revealed a systolic pressure of 836 in the distal left subclavian artery and  the same pressure in the thoracic aorta.  It appears her left vertebral artery was occluded proximally with several collaterals filling the artery distally and ultimately the artery filled in retrograde fashion.  I did discuss the case with Dr. Donnetta Hutching on the phone and we did not  feel that intervening on the innominate artery was worth the risk given that her only inflow at this point to her cerebral circulation is via the right vertebral and right carotid artery and she says her dizziness is manageable.  At this point in time a Mynx closure device was deployed in the right groin after wires and catheters were removed.  Additional manual pressure was held.  She was taken to PACU in stable condition.  Complications: None  Marty Heck, MD V[facility] and Vein Specialists of [place] Office: [phone] Pager: [place]

## 2017-11-27 NOTE — Progress Notes (Addendum)
Client with cont c/o nausea and Dr Carlis Abbott notified and order noted and med given. Vomited 300cc green liquid

## 2017-11-27 NOTE — H&P (Signed)
History and Physical Interval Note:  11/27/2017 2:15 PM  Tamara King  has presented today for surgery, with the diagnosis of verterbro basilar insufficency  The various methods of treatment have been discussed with the patient and family. After consideration of risks, benefits and other options for treatment, the patient has consented to  Procedure(s): UPPER EXTREMITY ANGIOGRAPHY (N/A) as a surgical intervention .  The patient's history has been reviewed, patient examined, no change in status, stable for surgery.  I have reviewed the patient's chart and labs.  Questions were answered to the patient's satisfaction.    Arch aortogram and plan to stent left subclavian for stenosis and improve perfusion in posterior circulation.  Marty Heck  Vascular and Vein Specialist of [facility]  Patient name: Tamara King          MRN: [id number]        DOB: [birth year]          Sex: female  REASON FOR VISIT: Follow-up CT angiogram of head and neck  Seen today in our [place] office  HPI: Tamara King is a 77 y.o. female here today for follow-up.  This is follow-up from recent outpatient evaluation I had.  She has a remote history of right carotid endarterectomy in 2000.  Recent duplex at patient facility is suggested potential high-grade stenosis recurrent in her right and also had unusual flow in the left carotid system.  She underwent CT angiogram for further evaluation of the discs and is here today for discussion.  She denies any new neurologic deficits.  She has had no focal issues.  She does continue to report an episode of 3-4 times per day will become unsteady on her feet and actually has to hold onto a door or piece of furniture to stable herself.  This is not related to any particular activities.      Past Medical History:  Diagnosis Date  . Breast cancer ([facility])    Adenocarcinoma  . Chronic edema   . Complication of anesthesia   . Coronary atherosclerosis of native  coronary artery    Nonobstructive at catheterization 2003, LVEF normal   . Essential hypertension, benign   . Mixed hyperlipidemia   . PONV (postoperative nausea and vomiting)   . Renal artery stenosis (HCC)    BMS bilateally 2003, PTCA bilaterally 2005 with restenosis  . Sleep apnea    Stop Bang score of 4  . Stroke [facility])          Family History  Problem Relation Age of Onset  . Anesthesia problems Neg Hx   . Hypotension Neg Hx   . Malignant hyperthermia Neg Hx   . Pseudochol deficiency Neg Hx   . Heart failure Mother   . Cancer Father     SOCIAL HISTORY: Social History        Tobacco Use  . Smoking status: Former Smoker    Packs/day: 1.00    Years: 35.00    Pack years: 35.00    Types: Cigarettes    Last attempt to quit: 01/22/1998    Years since quitting: 19.8  . Smokeless tobacco: Never Used  . Tobacco comment: tobacco use - no  Substance Use Topics  . Alcohol use: No    Allergies  Allergen Reactions  . Sulfa Antibiotics Anaphylaxis and Swelling  . Codeine Nausea And Vomiting  . Fish Allergy Other (See Comments)    "sick as a dog"  . Losartan   . Other Other (See  Comments)    Bologna - vomiting  . Penicillins     REACTION: rash  . Vancomycin Other (See Comments)    "drives me crazy"          Current Outpatient Medications  Medication Sig Dispense Refill  . acetaminophen (TYLENOL) 325 MG tablet Take 650 mg by mouth every 6 (six) hours as needed for mild pain. For pain    . aspirin EC 81 MG tablet Take 81 mg by mouth daily.    . cetirizine (ZYRTEC) 10 MG tablet Take 10 mg by mouth daily.    . clopidogrel (PLAVIX) 75 MG tablet Take 75 mg by mouth daily with breakfast.    . fluticasone (FLONASE) 50 MCG/ACT nasal spray Place 2 sprays into the nose daily as needed. For congestion    . furosemide (LASIX) 40 MG tablet Take 40 mg by mouth every morning.     . metoprolol tartrate (LOPRESSOR) 25 MG  tablet Take 25 mg by mouth 2 (two) times daily.    . pantoprazole (PROTONIX) 40 MG tablet Take 40 mg by mouth daily.    . pravastatin (PRAVACHOL) 40 MG tablet Take 40 mg by mouth daily.    . ranitidine (ZANTAC) 150 MG capsule Take 150 mg by mouth 2 (two) times daily.     No current facility-administered medications for this visit.     REVIEW OF SYSTEMS:  [X]  denotes positive finding, [ ]  denotes negative finding Cardiac  Comments:  Chest pain or chest pressure:    Shortness of breath upon exertion:    Short of breath when lying flat:    Irregular heart rhythm:        Vascular    Pain in calf, thigh, or hip brought on by ambulation:    Pain in feet at night that wakes you up from your sleep:     Blood clot in your veins:    Leg swelling:           PHYSICAL EXAM:    Vitals:   11/18/17 0906  BP: (!) 191/84  Pulse: 66  Resp: 16  Temp: (!) 97.5 F (36.4 C)  TempSrc: Temporal  Weight: 143 lb 6.4 oz (65 kg)  Height: 5' 3.5" (1.613 m)    GENERAL: The patient is a well-nourished female, in no acute distress. The vital signs are documented above. CARDIOVASCULAR: No change with well-healed right incision and bruits bilaterally PULMONARY: There is good air exchange  MUSCULOSKELETAL: There are no major deformities or cyanosis. NEUROLOGIC: No focal weakness or paresthesias are detected. SKIN: There are no ulcers or rashes noted. PSYCHIATRIC: The patient has a normal affect.  DATA:  CT angiogram reveals occlusion of her left common carotid and internal carotid artery extending into the intracranial segment of her internal carotid artery.  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  MEDICAL ISSUES: I discussed these findings in detail with the patient.  I explained that it is conceivable that she occluded her left carotid artery between the time of her duplex in early  October and the CT scan.  I suspect that this is extremely unlikely.  I feel that in all likelihood she did have a carotid occlusion that was misinterpreted by the ultrasonographer.  Fortunately does remain asymptomatic from this.  Not feel that this anatomy puts her at any particular risk for stroke.  I am concerned that she very well may be having vertebrobasilar insufficiency as  the cause of her unsteadiness.  She has no flow antegrade in her internal carotid on the left and has a high-grade stenosis in her innominate artery causing hypoperfusion in her right carotid and vertebral artery and also stenosis in her left vertebral artery takeoff.  I have recommended arteriography for further evaluation.  Explained that we would make recommendations regarding treatment if any pending the studies.  Schedule this at her convenience at [facility].  She understands that 1 of my partners will be doing the procedure.    Rosetta Posner, MD FACS [facility] of [facility] Tel [phone] Pager [phone]

## 2017-11-27 NOTE — Progress Notes (Signed)
Vomited 100 cc of greenish yellow material. Cool wash cloth applied to face and neck.

## 2017-11-28 ENCOUNTER — Encounter (HOSPITAL_COMMUNITY): Payer: Self-pay

## 2017-11-28 ENCOUNTER — Other Ambulatory Visit: Payer: Self-pay

## 2017-11-28 DIAGNOSIS — I6523 Occlusion and stenosis of bilateral carotid arteries: Secondary | ICD-10-CM | POA: Diagnosis not present

## 2017-11-28 MED ORDER — PANTOPRAZOLE SODIUM 40 MG PO TBEC: 40.0000 mg | DELAYED_RELEASE_TABLET | Freq: Every day | ORAL | Status: DC

## 2017-11-28 MED ORDER — SODIUM CHLORIDE 0.9 % WEIGHT BASED INFUSION: 1.0000 mL/kg/h | INTRAVENOUS | Status: DC

## 2017-11-28 MED ORDER — SODIUM CHLORIDE 0.9 % IV SOLN: 250.0000 mL | INTRAVENOUS | Status: DC | PRN

## 2017-11-28 MED ORDER — CLOPIDOGREL BISULFATE 75 MG PO TABS: 75.0000 mg | ORAL_TABLET | Freq: Every day | ORAL | Status: DC

## 2017-11-28 MED ORDER — SODIUM CHLORIDE 0.9% FLUSH: 3.0000 mL | INTRAVENOUS | Status: DC | PRN

## 2017-11-28 MED ORDER — LABETALOL HCL 5 MG/ML IV SOLN
10.0000 mg | INTRAVENOUS | Status: DC | PRN
  Filled 2017-11-28: qty 4

## 2017-11-28 MED ORDER — METOPROLOL TARTRATE 25 MG PO TABS: 25.0000 mg | ORAL_TABLET | Freq: Two times a day (BID) | ORAL | Status: DC

## 2017-11-28 MED ORDER — ASPIRIN EC 81 MG PO TBEC: 81.0000 mg | DELAYED_RELEASE_TABLET | Freq: Every day | ORAL | Status: DC

## 2017-11-28 MED ORDER — HYDRALAZINE HCL 20 MG/ML IJ SOLN: 5.0000 mg | INTRAMUSCULAR | Status: DC | PRN

## 2017-11-28 MED ORDER — ONDANSETRON HCL 4 MG/2ML IJ SOLN: 4.0000 mg | Freq: Four times a day (QID) | INTRAMUSCULAR | Status: DC | PRN

## 2017-11-28 MED ORDER — ACETAMINOPHEN 325 MG PO TABS: 650.0000 mg | ORAL_TABLET | ORAL | Status: DC | PRN

## 2017-11-28 MED ORDER — SODIUM CHLORIDE 0.9% FLUSH: 3.0000 mL | Freq: Two times a day (BID) | INTRAVENOUS | Status: DC

## 2017-11-28 MED ORDER — PROMETHAZINE HCL 25 MG/ML IJ SOLN: 12.5000 mg | Freq: Four times a day (QID) | INTRAMUSCULAR | Status: DC | PRN

## 2017-11-28 MED ORDER — PRAVASTATIN SODIUM 40 MG PO TABS: 40.0000 mg | ORAL_TABLET | Freq: Every evening | ORAL | Status: DC

## 2017-11-28 NOTE — Progress Notes (Signed)
[name] to be D/C'd  per MD order. Discussed with the patient and all questions fully answered.  VSS, Skin clean, dry and intact without evidence of skin break down, no evidence of skin tears noted.  IV catheter discontinued intact. Site without signs and symptoms of complications. Dressing and pressure applied.  An After Visit Summary was printed and given to the patient. Patient received prescription.  D/c education completed with patient/family including follow up instructions, medication list, d/c activities limitations if indicated, with other d/c instructions as indicated by MD - patient able to verbalize understanding, all questions fully answered.   Patient instructed to return to ED, call 911, or call MD for any changes in condition.   Patient to be escorted via [place], and D/C home via private auto.

## 2017-11-28 NOTE — Progress Notes (Signed)
[facility] of [place]  Subjective  - Arch aortogram yesterday.  Obs overnight due to severe post-op nausea/vomitting that did not resolve.  Doing much better this am.   Objective (!) 125/54 87 98.4 F (36.9 C) (Oral) 20 92%  Intake/Output Summary (Last 24 hours) at 11/28/2017 0903 Last data filed at 11/28/2017 0500 Gross per 24 hour  Intake 350 ml  Output 100 ml  Net 250 ml    General: NAD, resting Right groin c/d/i, no hematoma Feet warm   Laboratory Lab Results: Recent Labs    11/27/17 1014  HGB 14.6  HCT 43.0   BMET Recent Labs    11/27/17 1014  NA 141  K 3.5  CL 104  GLUCOSE 113*  BUN 21  CREATININE 1.10*    COAG No results found for: INR, PROTIME No results found for: PTT  Assessment/Planning: Doing much better this am.  Nausea/vomitting resolved.  Will plan for d/c home today.  Follow-up with Dr. Donnetta Hutching in one year with carotid duplex.   Marty Heck 11/28/2017 9:03 AM --

## 2017-11-28 NOTE — Plan of Care (Signed)

## 2017-11-28 NOTE — Progress Notes (Signed)
Received pt. Alert and oriented. Denies nausea at this time. Denies pain. Pt had a incision site covered with gauze dressing with transparent tape to right groin, clean dry and intact.  Oriented pt with room. Ice water provided. Will monitor pt.

## 2017-12-05 NOTE — Discharge Summary (Signed)
[facility] Specialists Discharge Summary   Patient ID:  Tamara King MRN: [id number] DOB/AGE: [birth year] 77 y.o.  Admit date: 11/27/2017 Discharge date: 11/28/2017 Date of Surgery: 11/27/2017 Surgeon: Surgeon(s): Marty Heck, MD  Admission Diagnosis: verterbro basilar insufficency  Discharge Diagnoses:  verterbro basilar insufficency  Secondary Diagnoses: Past Medical History:  Diagnosis Date  . Breast cancer ([place])    Adenocarcinoma  . Chronic edema   . Complication of anesthesia   . Coronary atherosclerosis of native coronary artery    Nonobstructive at catheterization 2003, LVEF normal   . Essential hypertension, benign   . Mixed hyperlipidemia   . PONV (postoperative nausea and vomiting)   . Renal artery stenosis (HCC)    BMS bilateally 2003, PTCA bilaterally 2005 with restenosis  . Sleep apnea    Stop Bang score of 4  . Stroke [facility])     Procedure(s): UPPER EXTREMITY ANGIOGRAPHY  Discharged Condition: stable  HPI: Tamara King a 77 y.o.femalehere today for follow-up. This is follow-up from recent outpatient evaluation I had. She has a remote history of right carotid endarterectomy in 2000. Recent duplex at patient facility is suggested potential high-grade stenosis recurrent in her right and also had unusual flow in the left carotid system. She underwent CT angiogram for further evaluation of the discs and is here today for discussion. She denies any new neurologic deficits. She has had no focal issues. She does continue to report an episode of 3-4 times per day will become unsteady on her feet and actually has to hold onto a door or piece of furniture to stable herself. This is not related to any particular activities.  CT angiogram reveals occlusion of her left common carotid and internal carotid artery extending into the intracranial segment of her internal carotid artery. She also has high-grade stenosis of her innominate artery on the  right and also proximal left subclavian artery.  Dr. Donnetta Hutching   recommended arteriography for further evaluation.   Hospital Course:  Tamara King is a 77 y.o. female is S/P Left Procedure(s): UPPER EXTREMITY ANGIOGRAPHY without intervention.  It appears her left vertebral artery was occluded proximally with several collaterals filling the artery distally and ultimately the artery filled in retrograde fashion.  Dr. Carlis Abbott did discuss the case with Dr. Donnetta Hutching on the phone and they did not feel that intervening on the innominate artery was worth the risk given that her only inflow at this point to her cerebral circulation is via the right vertebral and right carotid artery and she says her dizziness is manageable.      Significant Diagnostic Studies: CBC Lab Results  Component Value Date   WBC 7.0 06/18/2016   HGB 14.6 11/27/2017   HCT 43.0 11/27/2017   MCV 92.1 06/18/2016   PLT 198 06/18/2016    BMET    Component Value Date/Time   NA 141 11/27/2017 1014   K 3.5 11/27/2017 1014   CL 104 11/27/2017 1014   CO2 28 06/18/2016 0526   GLUCOSE 113 (H) 11/27/2017 1014   BUN 21 11/27/2017 1014   CREATININE 1.10 (H) 11/27/2017 1014   CALCIUM 9.1 06/18/2016 0526   GFRNONAA >60 06/18/2016 0526   GFRAA >60 06/18/2016 0526   COAG No results found for: INR, PROTIME   Disposition:  Discharge to :Home Discharge Instructions    Call MD for:  redness, tenderness, or signs of infection (pain, swelling, bleeding, redness, odor or green/yellow discharge around incision site)   Complete by:  As directed    Call MD for:  severe or increased pain, loss or decreased feeling  in affected limb(s)   Complete by:  As directed    Call MD for:  temperature >100.5   Complete by:  As directed    Resume previous diet   Complete by:  As directed      Allergies as of 11/28/2017      Reactions   Sulfa Antibiotics Anaphylaxis, Swelling   Codeine Nausea And Vomiting   Fish Allergy Other (See Comments)    "sick as a dog"   Losartan    Other Other (See Comments)   Bologna - vomiting   Vancomycin Other (See Comments)   "drives me crazy"   Penicillins Rash   Has patient had a PCN reaction causing immediate rash, facial/tongue/throat swelling, SOB or lightheadedness with hypotension: Unknown Has patient had a PCN reaction causing severe rash involving mucus membranes or skin necrosis: Unknown Has patient had a PCN reaction that required hospitalization: No Has patient had a PCN reaction occurring within the last 10 years: No Childhood reaction. If all of the above answers are "NO", then may proceed with Cephalosporin use.      Medication List    TAKE these medications   acetaminophen 325 MG tablet Commonly known as:  TYLENOL Take 650 mg by mouth every 6 (six) hours as needed for mild pain. For pain   aspirin EC 81 MG tablet Take 81 mg by mouth daily.   cetirizine 10 MG tablet Commonly known as:  ZYRTEC Take 10 mg by mouth daily.   clopidogrel 75 MG tablet Commonly known as:  PLAVIX Take 75 mg by mouth daily.   fluticasone 50 MCG/ACT nasal spray Commonly known as:  FLONASE Place 2 sprays into the nose daily as needed for allergies (congestion.).   furosemide 40 MG tablet Commonly known as:  LASIX Take 40 mg by mouth daily.   meloxicam 7.5 MG tablet Commonly known as:  MOBIC Take 7.5 mg by mouth daily.   metoprolol tartrate 25 MG tablet Commonly known as:  LOPRESSOR Take 25 mg by mouth 2 (two) times daily.   pantoprazole 40 MG tablet Commonly known as:  PROTONIX Take 40 mg by mouth daily before breakfast.   potassium chloride 10 MEQ tablet Commonly known as:  K-DUR Take 10 mEq by mouth daily.   pravastatin 40 MG tablet Commonly known as:  PRAVACHOL Take 40 mg by mouth every evening.   ranitidine 150 MG tablet Commonly known as:  ZANTAC Take 150 mg by mouth 2 (two) times daily.      Verbal and written Discharge instructions given to the patient. Wound care  per Discharge AVS   Signed: Roxy Horseman 12/05/2017, 8:06 PM

## 2018-10-03 ENCOUNTER — Telehealth: Payer: Self-pay

## 2018-10-03 NOTE — Telephone Encounter (Signed)
Patient has an appointment scheduled to establish care on 11/10/2018 with Particia Nearing.  She is concerned because her blood pressure has been elevated and wants to know if she can be seen sooner.  I scheduled her an appointment with Hendricks Limes on 10/22/2018.  Advised patient to monitor blood pressure and go to ER if blood pressure is elevated.

## 2018-10-16 DIAGNOSIS — I83813 Varicose veins of bilateral lower extremities with pain: Secondary | ICD-10-CM | POA: Insufficient documentation

## 2018-10-16 DIAGNOSIS — I83891 Varicose veins of right lower extremities with other complications: Secondary | ICD-10-CM

## 2018-10-16 HISTORY — DX: Varicose veins of right lower extremity with other complications: I83.891

## 2018-10-21 ENCOUNTER — Other Ambulatory Visit: Payer: Self-pay

## 2018-10-22 ENCOUNTER — Ambulatory Visit (INDEPENDENT_AMBULATORY_CARE_PROVIDER_SITE_OTHER): Payer: Medicare Other | Admitting: Family Medicine

## 2018-10-22 ENCOUNTER — Encounter: Payer: Self-pay | Admitting: Family Medicine

## 2018-10-22 VITALS — BP 179/78 | HR 62 | Temp 98.6°F | Ht 63.5 in | Wt 148.0 lb

## 2018-10-22 DIAGNOSIS — I10 Essential (primary) hypertension: Secondary | ICD-10-CM

## 2018-10-22 DIAGNOSIS — N183 Chronic kidney disease, stage 3 unspecified: Secondary | ICD-10-CM

## 2018-10-22 DIAGNOSIS — Z8673 Personal history of transient ischemic attack (TIA), and cerebral infarction without residual deficits: Secondary | ICD-10-CM

## 2018-10-22 DIAGNOSIS — R600 Localized edema: Secondary | ICD-10-CM

## 2018-10-22 DIAGNOSIS — G609 Hereditary and idiopathic neuropathy, unspecified: Secondary | ICD-10-CM

## 2018-10-22 DIAGNOSIS — K5900 Constipation, unspecified: Secondary | ICD-10-CM

## 2018-10-22 DIAGNOSIS — K219 Gastro-esophageal reflux disease without esophagitis: Secondary | ICD-10-CM

## 2018-10-22 DIAGNOSIS — I6522 Occlusion and stenosis of left carotid artery: Secondary | ICD-10-CM

## 2018-10-22 DIAGNOSIS — H539 Unspecified visual disturbance: Secondary | ICD-10-CM

## 2018-10-22 DIAGNOSIS — J302 Other seasonal allergic rhinitis: Secondary | ICD-10-CM

## 2018-10-22 DIAGNOSIS — I83891 Varicose veins of right lower extremities with other complications: Secondary | ICD-10-CM

## 2018-10-22 DIAGNOSIS — M159 Polyosteoarthritis, unspecified: Secondary | ICD-10-CM

## 2018-10-22 DIAGNOSIS — Z853 Personal history of malignant neoplasm of breast: Secondary | ICD-10-CM

## 2018-10-22 DIAGNOSIS — M150 Primary generalized (osteo)arthritis: Secondary | ICD-10-CM

## 2018-10-22 DIAGNOSIS — E785 Hyperlipidemia, unspecified: Secondary | ICD-10-CM

## 2018-10-22 HISTORY — DX: Chronic kidney disease, stage 3 unspecified: N18.30

## 2018-10-22 MED ORDER — PRAVASTATIN SODIUM 80 MG PO TABS: 80.0000 mg | ORAL_TABLET | Freq: Every day | ORAL | 1 refills | Status: DC

## 2018-10-22 MED ORDER — PANTOPRAZOLE SODIUM 40 MG PO TBEC: 40.0000 mg | DELAYED_RELEASE_TABLET | Freq: Every day | ORAL | 1 refills | Status: DC

## 2018-10-22 MED ORDER — FAMOTIDINE 20 MG PO TABS: 20.0000 mg | ORAL_TABLET | Freq: Every day | ORAL | 1 refills | Status: DC

## 2018-10-22 MED ORDER — CETIRIZINE HCL 10 MG PO TABS: 10.0000 mg | ORAL_TABLET | Freq: Every day | ORAL | 1 refills | Status: DC

## 2018-10-22 MED ORDER — AMLODIPINE BESYLATE 10 MG PO TABS: 10.0000 mg | ORAL_TABLET | Freq: Every day | ORAL | 2 refills | Status: DC

## 2018-10-22 MED ORDER — CLOPIDOGREL BISULFATE 75 MG PO TABS: 75.0000 mg | ORAL_TABLET | Freq: Every day | ORAL | 1 refills | Status: DC

## 2018-10-22 NOTE — Patient Instructions (Addendum)
You can take Tylenol 1,000 mg (2 of the 500 mg tablets) three times a day as needed for pain.   Voltaren gel is a good alternative to try on your joints - you can apply this four times a day as needed.   [facility] in [place] Phone: [phone] ext. [phone] *Call and schedule your mammogram*  Miralax 1 capful in 6-8 oz beverage of choice once daily as needed for constipation.   Check your blood pressure periodically at home and keep a log to bring with you to your next appointment. Your goal is < 150/90; if you are consistently over this please let me know before your next appointment.    DASH Eating Plan DASH stands for "Dietary Approaches to Stop Hypertension." The DASH eating plan is a healthy eating plan that has been shown to reduce high blood pressure (hypertension). It may also reduce your risk for type 2 diabetes, heart disease, and stroke. The DASH eating plan may also help with weight loss. What are tips for following this plan?  General guidelines  Avoid eating more than 2,300 mg (milligrams) of salt (sodium) a day. If you have hypertension, you may need to reduce your sodium intake to 1,500 mg a day.  Limit alcohol intake to no more than 1 drink a day for nonpregnant women and 2 drinks a day for men. One drink equals 12 oz of beer, 5 oz of wine, or 1 oz of hard liquor.  Work with your health care provider to maintain a healthy body weight or to lose weight. Ask what an ideal weight is for you.  Get at least 30 minutes of exercise that causes your heart to beat faster (aerobic exercise) most days of the week. Activities may include walking, swimming, or biking.  Work with your health care provider or diet and nutrition specialist (dietitian) to adjust your eating plan to your individual calorie needs. Reading food labels   Check food labels for the amount of sodium per serving. Choose foods with less than 5 percent of the Daily Value of sodium. Generally, foods  with less than 300 mg of sodium per serving fit into this eating plan.  To find whole grains, look for the word "whole" as the first word in the ingredient list. Shopping  Buy products labeled as "low-sodium" or "no salt added."  Buy fresh foods. Avoid canned foods and premade or frozen meals. Cooking  Avoid adding salt when cooking. Use salt-free seasonings or herbs instead of table salt or sea salt. Check with your health care provider or pharmacist before using salt substitutes.  Do not fry foods. Cook foods using healthy methods such as baking, boiling, grilling, and broiling instead.  Cook with heart-healthy oils, such as olive, canola, soybean, or sunflower oil. Meal planning  Eat a balanced diet that includes: ? 5 or more servings of fruits and vegetables each day. At each meal, try to fill half of your plate with fruits and vegetables. ? Up to 6-8 servings of whole grains each day. ? Less than 6 oz of lean meat, poultry, or fish each day. A 3-oz serving of meat is about the same size as a deck of cards. One egg equals 1 oz. ? 2 servings of low-fat dairy each day. ? A serving of nuts, seeds, or beans 5 times each week. ? Heart-healthy fats. Healthy fats called Omega-3 fatty acids are found in foods such as flaxseeds and coldwater fish, like sardines, salmon, and mackerel.  Limit how much you eat of the following: ? Canned or prepackaged foods. ? Food that is high in trans fat, such as fried foods. ? Food that is high in saturated fat, such as fatty meat. ? Sweets, desserts, sugary drinks, and other foods with added sugar. ? Full-fat dairy products.  Do not salt foods before eating.  Try to eat at least 2 vegetarian meals each week.  Eat more home-cooked food and less restaurant, buffet, and fast food.  When eating at a restaurant, ask that your food be prepared with less salt or no salt, if possible. What foods are recommended? The items listed may not be a complete  list. Talk with your dietitian about what dietary choices are best for you. Grains Whole-grain or whole-wheat bread. Whole-grain or whole-wheat pasta. Brown rice. [place]row. Bulgur. Whole-grain and low-sodium cereals. Pita bread. Low-fat, low-sodium crackers. Whole-wheat flour tortillas. Vegetables Fresh or frozen vegetables (raw, steamed, roasted, or grilled). Low-sodium or reduced-sodium tomato and vegetable juice. Low-sodium or reduced-sodium tomato sauce and tomato paste. Low-sodium or reduced-sodium canned vegetables. Fruits All fresh, dried, or frozen fruit. Canned fruit in natural juice (without added sugar). Meat and other protein foods Skinless chicken or [place]. Ground chicken or [place]. Pork with fat trimmed off. Fish and seafood. Egg whites. Dried beans, peas, or lentils. Unsalted nuts, nut butters, and seeds. Unsalted canned beans. Lean cuts of beef with fat trimmed off. Low-sodium, lean deli meat. Dairy Low-fat (1%) or fat-free (skim) milk. Fat-free, low-fat, or reduced-fat cheeses. Nonfat, low-sodium ricotta or cottage cheese. Low-fat or nonfat yogurt. Low-fat, low-sodium cheese. Fats and oils Soft margarine without trans fats. Vegetable oil. Low-fat, reduced-fat, or light mayonnaise and salad dressings (reduced-sodium). Canola, safflower, olive, soybean, and sunflower oils. Avocado. Seasoning and other foods Herbs. Spices. Seasoning mixes without salt. Unsalted popcorn and pretzels. Fat-free sweets. What foods are not recommended? The items listed may not be a complete list. Talk with your dietitian about what dietary choices are best for you. Grains Baked goods made with fat, such as croissants, muffins, or some breads. Dry pasta or rice meal packs. Vegetables Creamed or fried vegetables. Vegetables in a cheese sauce. Regular canned vegetables (not low-sodium or reduced-sodium). Regular canned tomato sauce and paste (not low-sodium or reduced-sodium). Regular tomato and  vegetable juice (not low-sodium or reduced-sodium). Angie Fava. Olives. Fruits Canned fruit in a light or heavy syrup. Fried fruit. Fruit in cream or butter sauce. Meat and other protein foods Fatty cuts of meat. Ribs. Fried meat. Berniece Salines. Sausage. Bologna and other processed lunch meats. Salami. Fatback. Hotdogs. Bratwurst. Salted nuts and seeds. Canned beans with added salt. Canned or smoked fish. Whole eggs or egg yolks. Chicken or [place] with skin. Dairy Whole or 2% milk, cream, and half-and-half. Whole or full-fat cream cheese. Whole-fat or sweetened yogurt. Full-fat cheese. Nondairy creamers. Whipped toppings. Processed cheese and cheese spreads. Fats and oils Butter. Stick margarine. Lard. Shortening. Ghee. Bacon fat. Tropical oils, such as coconut, palm kernel, or palm oil. Seasoning and other foods Salted popcorn and pretzels. Onion salt, garlic salt, seasoned salt, table salt, and sea salt. Worcestershire sauce. Tartar sauce. Barbecue sauce. Teriyaki sauce. Soy sauce, including reduced-sodium. Steak sauce. Canned and packaged gravies. Fish sauce. Oyster sauce. Cocktail sauce. Horseradish that you find on the shelf. Ketchup. Mustard. Meat flavorings and tenderizers. Bouillon cubes. Hot sauce and Tabasco sauce. Premade or packaged marinades. Premade or packaged taco seasonings. Relishes. Regular salad dressings. Where to find more information:  National Heart, Lung, and Blood  Institute: https://wilson-eaton.com/  American Heart Association: www.heart.org Summary  The DASH eating plan is a healthy eating plan that has been shown to reduce high blood pressure (hypertension). It may also reduce your risk for type 2 diabetes, heart disease, and stroke.  With the DASH eating plan, you should limit salt (sodium) intake to 2,300 mg a day. If you have hypertension, you may need to reduce your sodium intake to 1,500 mg a day.  When on the DASH eating plan, aim to eat more fresh fruits and vegetables, whole  grains, lean proteins, low-fat dairy, and heart-healthy fats.  Work with your health care provider or diet and nutrition specialist (dietitian) to adjust your eating plan to your individual calorie needs. This information is not intended to replace advice given to you by your health care provider. Make sure you discuss any questions you have with your health care provider. Document Released: 12/28/2010 Document Revised: 12/21/2016 Document Reviewed: 01/02/2016 Elsevier Patient Education  2020 [employer].

## 2018-10-22 NOTE — Progress Notes (Signed)
New Patient Office Visit  Assessment & Plan:  1. Essential (primary) hypertension - Uncontrolled on amlodipine 5 mg QD and metoprolol 25 mg BID. Amlodipine increased from 5 mg to 10 mg QD. Patient to keep a log of blood pressure readings and bring to the next appointment. Goal BP < 150/90. Education provided on the DASH diet.  - CMP14+EGFR - amLODipine (NORVASC) 10 MG tablet; Take 1 tablet (10 mg total) by mouth daily.  Dispense: 30 tablet; Refill: 2  2. CKD (chronic kidney disease) stage 3, GFR 30-59 ml/min - Discontinued meloxicam today.  - CMP14+EGFR  3. Hyperlipidemia, unspecified hyperlipidemia type - Well controlled on current regimen.  - CMP14+EGFR - pravastatin (PRAVACHOL) 80 MG tablet; Take 1 tablet (80 mg total) by mouth daily.  Dispense: 90 tablet; Refill: 1  4. Occlusion and stenosis of left carotid artery - Managed by Dr. Donnetta Hutching (vascular). I have sent a message to help facilitate getting patient a follow-up appointment as she has been very confused what to do since her previous PCP retired this year.  - clopidogrel (PLAVIX) 75 MG tablet; Take 1 tablet (75 mg total) by mouth daily.  Dispense: 90 tablet; Refill: 1  5. Symptomatic varicose veins, right - Patient has seen Dr. Laurance Flatten for varicose veins and was advised to wear compression hose and elevate her legs. She is trying OTC compression since the prescription ones were too tight just below the knee and caused significant pain.   6. Gastroesophageal reflux disease, esophagitis presence not specified - Well controlled on current regimen.  - pantoprazole (PROTONIX) 40 MG tablet; Take 1 tablet (40 mg total) by mouth daily before breakfast.  Dispense: 90 tablet; Refill: 1 - famotidine (PEPCID) 20 MG tablet; Take 1 tablet (20 mg total) by mouth daily.  Dispense: 90 tablet; Refill: 1  7. Idiopathic peripheral neuropathy - Not currently on treatment. No complaints regarding this today.   8. Primary osteoarthritis involving  multiple joints - D/C'd meloxicam due to kidney function. Advised her she can take Tylenol 1,000 mg PO TID PRN. Also educated her on Voltaren gel OTC that she can use QID PRN.   9. Lower extremity edema - Well controlled on current regimen.   10. History of breast cancer - Patient reports she would like to continue having mammograms due to her history of breast cancer. She will call the [facility] in [place] to schedule.   11. Constipation, unspecified constipation type - Miralax 1 capful in 6-8 oz beverage of choice once daily as needed.   12. Vision changes - Encouraged to schedule follow-up eye exam. She has not been seen since last year at which point they told her she may be developing macular degeneration.   13. History of CVA (cerebrovascular accident) - clopidogrel (PLAVIX) 75 MG tablet; Take 1 tablet (75 mg total) by mouth daily.  Dispense: 90 tablet; Refill: 1  14. Seasonal allergies - Well controlled on current regimen.  - cetirizine (ZYRTEC) 10 MG tablet; Take 1 tablet (10 mg total) by mouth daily.  Dispense: 90 tablet; Refill: 1   Follow-up: Return in about 4 weeks (around 11/19/2018) for HTN.   Hendricks Limes, MSN, APRN, FNP-C [facility]  Subjective:  Patient ID: Tamara King, female    DOB: [birth year]  Age: 78 y.o. MRN: [id number]  Patient Care Team: Loman Brooklyn, FNP as PCP - General (Family Medicine)  CC:  Chief Complaint  Patient presents with  . New Patient (Initial Visit)  HPI SAORY CARRIERO presents to establish care. She is transferring care from Dr. Murrell Redden office as he has retired and the office has closed.   Blood pressure elevated today. Patient reports she has already taken her medications this morning. She does check her BP at home and reports it is consistent with readings today.    Review of Systems  Constitutional: Negative for chills, fever, malaise/fatigue and weight loss.  HENT: Negative for  congestion, ear discharge, ear pain, nosebleeds, sinus pain, sore throat and tinnitus.   Eyes: Negative for blurred vision, double vision, pain, discharge and redness.       Feels like she is looking through shattered glass in left eye at times.  Respiratory: Positive for shortness of breath (with exertion). Negative for cough and wheezing.   Cardiovascular: Positive for leg swelling. Negative for chest pain and palpitations.  Gastrointestinal: Negative for abdominal pain, constipation, diarrhea, heartburn, nausea and vomiting.  Genitourinary: Negative for dysuria, frequency and urgency.  Musculoskeletal: Negative for myalgias.  Skin: Negative for rash.  Neurological: Negative for dizziness, seizures, weakness and headaches.  Psychiatric/Behavioral: Negative for depression, substance abuse and suicidal ideas. The patient is not nervous/anxious.     Current Outpatient Medications:  .  acetaminophen (TYLENOL) 325 MG tablet, Take 650 mg by mouth every 6 (six) hours as needed for mild pain. For pain, Disp: , Rfl:  .  amLODipine (NORVASC) 10 MG tablet, Take 1 tablet (10 mg total) by mouth daily., Disp: 30 tablet, Rfl: 2 .  aspirin EC 81 MG tablet, Take 81 mg by mouth daily., Disp: , Rfl:  .  cetirizine (ZYRTEC) 10 MG tablet, Take 1 tablet (10 mg total) by mouth daily., Disp: 90 tablet, Rfl: 1 .  clopidogrel (PLAVIX) 75 MG tablet, Take 1 tablet (75 mg total) by mouth daily., Disp: 90 tablet, Rfl: 1 .  famotidine (PEPCID) 20 MG tablet, Take 1 tablet (20 mg total) by mouth daily., Disp: 90 tablet, Rfl: 1 .  fluticasone (FLONASE) 50 MCG/ACT nasal spray, Place 2 sprays into the nose daily as needed for allergies (congestion.). , Disp: , Rfl:  .  furosemide (LASIX) 40 MG tablet, Take 40 mg by mouth daily. , Disp: , Rfl:  .  metoprolol tartrate (LOPRESSOR) 25 MG tablet, Take 25 mg by mouth 2 (two) times daily., Disp: , Rfl:  .  pantoprazole (PROTONIX) 40 MG tablet, Take 1 tablet (40 mg total) by mouth  daily before breakfast., Disp: 90 tablet, Rfl: 1 .  potassium chloride (K-DUR) 10 MEQ tablet, Take 10 mEq by mouth daily., Disp: , Rfl: 3 .  pravastatin (PRAVACHOL) 80 MG tablet, Take 1 tablet (80 mg total) by mouth daily., Disp: 90 tablet, Rfl: 1  Allergies  Allergen Reactions  . Sulfa Antibiotics Anaphylaxis and Swelling  . Codeine Nausea And Vomiting  . Fish Allergy Other (See Comments)    "sick as a dog"  . Losartan   . Other Other (See Comments)    Bologna - vomiting  . Vancomycin Other (See Comments)    "drives me crazy"  . Penicillins Rash    Has patient had a PCN reaction causing immediate rash, facial/tongue/throat swelling, SOB or lightheadedness with hypotension: Unknown Has patient had a PCN reaction causing severe rash involving mucus membranes or skin necrosis: Unknown Has patient had a PCN reaction that required hospitalization: No Has patient had a PCN reaction occurring within the last 10 years: No Childhood reaction. If all of the above answers are "NO", then may  proceed with Cephalosporin use.     Past Medical History:  Diagnosis Date  . Breast cancer ([place])    Adenocarcinoma  . Chronic edema   . CKD (chronic kidney disease) stage 3, GFR 30-59 ml/min ([place]) 10/22/2018  . Complication of anesthesia   . Coronary atherosclerosis of native coronary artery    Nonobstructive at catheterization 2003, LVEF normal   . Essential hypertension, benign   . GERD (gastroesophageal reflux disease) 07/06/2013  . Idiopathic peripheral neuropathy 10/06/2013  . Mixed hyperlipidemia   . Occlusion and stenosis of left carotid artery 10/22/2017  . PONV (postoperative nausea and vomiting)   . Renal artery stenosis ([facility])    BMS bilateally 2003, PTCA bilaterally 2005 with restenosis  . Sleep apnea    Stop Bang score of 4  . Stroke ([place])   . Symptomatic varicose veins, right 10/16/2018    Past Surgical History:  Procedure Laterality Date  . APPENDECTOMY    . CATARACT EXTRACTION  W/PHACO  03/29/2011   Procedure: CATARACT EXTRACTION PHACO AND INTRAOCULAR LENS PLACEMENT (IOC);  Surgeon: Tonny Branch, MD;  Location: [facility];  Service: Ophthalmology;  Laterality: Right;  CDE:12.82  . CATARACT EXTRACTION W/PHACO  04/16/2011   Procedure: CATARACT EXTRACTION PHACO AND INTRAOCULAR LENS PLACEMENT (IOC);  Surgeon: Tonny Branch, MD;  Location: [facility];  Service: Ophthalmology;  Laterality: Left;  CDE: 11.54  . CESAREAN SECTION     x2  . CHOLECYSTECTOMY  2018  . HEMORRHOID SURGERY    . Right breast lumpectomy    . ROTATOR CUFF REPAIR  2008  . UPPER EXTREMITY ANGIOGRAPHY N/A 11/27/2017   Procedure: UPPER EXTREMITY ANGIOGRAPHY;  Surgeon: Marty Heck, MD;  Location: [place] [facility];  Service: Cardiovascular;  Laterality: N/A;    Family History  Problem Relation Age of Onset  . Heart failure Mother   . Stroke Mother 68  . Lung cancer Father   . Diabetes Father   . Diabetes Sister   . Brain cancer Sister   . Brain cancer Brother   . Diabetes Brother   . Diabetes Brother   . COPD Sister   . Diabetes Son   . Diabetes Son   . Anesthesia problems Neg Hx   . Hypotension Neg Hx   . Malignant hyperthermia Neg Hx   . Pseudochol deficiency Neg Hx     Social History   Socioeconomic History  . Marital status: Widowed    Spouse name: Not on file  . Number of children: 2  . Years of education: 12th  . Highest education level: Not on file  Occupational History    Employer: RETIRED    Comment: n/a  Social Needs  . Financial resource strain: Not on file  . Food insecurity    Worry: Not on file    Inability: Not on file  . Transportation needs    Medical: Not on file    Non-medical: Not on file  Tobacco Use  . Smoking status: Former Smoker    Packs/day: 1.00    Years: 35.00    Pack years: 35.00    Types: Cigarettes    Quit date: 01/22/1998    Years since quitting: 20.7  . Smokeless tobacco: Never Used  . Tobacco comment: tobacco use - no  Substance and Sexual  Activity  . Alcohol use: No  . Drug use: No  . Sexual activity: Not on file  Lifestyle  . Physical activity    Days per week: Not on file  Minutes per session: Not on file  . Stress: Not on file  Relationships  . Social Herbalist on phone: Not on file    Gets together: Not on file    Attends religious service: Not on file    Active member of club or organization: Not on file    Attends meetings of clubs or organizations: Not on file    Relationship status: Not on file  . Intimate partner violence    Fear of current or ex partner: Not on file    Emotionally abused: Not on file    Physically abused: Not on file    Forced sexual activity: Not on file  Other Topics Concern  . Not on file  Social History Narrative   Patient lives at home with family.   Caffeine use; 2-5 cups daily    Objective:   Today's Vitals: BP (!) 179/78   Pulse 62   Temp 98.6 F (37 C) (Temporal)   Ht 5' 3.5" (1.613 m)   Wt 148 lb (67.1 kg)   SpO2 95%   BMI 25.81 kg/m   Physical Exam Vitals signs reviewed.  Constitutional:      General: She is not in acute distress.    Appearance: Normal appearance. She is normal weight. She is not ill-appearing, toxic-appearing or diaphoretic.  HENT:     Head: Normocephalic and atraumatic.  Eyes:     General: No scleral icterus.       Right eye: No discharge.        Left eye: No discharge.     Conjunctiva/sclera: Conjunctivae normal.  Neck:     Musculoskeletal: Normal range of motion.  Cardiovascular:     Rate and Rhythm: Normal rate and regular rhythm.     Heart sounds: Normal heart sounds. No murmur. No friction rub. No gallop.   Pulmonary:     Effort: Pulmonary effort is normal. No respiratory distress.     Breath sounds: Normal breath sounds. No stridor. No wheezing, rhonchi or rales.  Musculoskeletal: Normal range of motion.  Skin:    General: Skin is warm and dry.     Capillary Refill: Capillary refill takes less than 2 seconds.   Neurological:     General: No focal deficit present.     Mental Status: She is alert and oriented to person, place, and time. Mental status is at baseline.  Psychiatric:        Mood and Affect: Mood normal.        Behavior: Behavior normal.        Thought Content: Thought content normal.        Judgment: Judgment normal.

## 2018-10-23 LAB — CMP14+EGFR
ALT: 13 IU/L (ref 0–32)
AST: 22 IU/L (ref 0–40)
Albumin/Globulin Ratio: 2 (ref 1.2–2.2)
Albumin: 4.5 g/dL (ref 3.7–4.7)
Alkaline Phosphatase: 85 IU/L (ref 39–117)
BUN/Creatinine Ratio: 17 (ref 12–28)
BUN: 19 mg/dL (ref 8–27)
Bilirubin Total: 0.6 mg/dL (ref 0.0–1.2)
CO2: 25 mmol/L (ref 20–29)
Calcium: 9.3 mg/dL (ref 8.7–10.3)
Chloride: 105 mmol/L (ref 96–106)
Creatinine, Ser: 1.11 mg/dL — ABNORMAL HIGH (ref 0.57–1.00)
GFR calc Af Amer: 56 mL/min/{1.73_m2} — ABNORMAL LOW (ref >59)
GFR calc non Af Amer: 48 mL/min/{1.73_m2} — ABNORMAL LOW (ref >59)
Globulin, Total: 2.2 g/dL (ref 1.5–4.5)
Glucose: 160 mg/dL — ABNORMAL HIGH (ref 65–99)
Potassium: 4.1 mmol/L (ref 3.5–5.2)
Sodium: 143 mmol/L (ref 134–144)
Total Protein: 6.7 g/dL (ref 6.0–8.5)

## 2018-10-27 ENCOUNTER — Other Ambulatory Visit: Payer: Self-pay | Admitting: *Deleted

## 2018-10-28 MED ORDER — METOPROLOL TARTRATE 25 MG PO TABS: 25.0000 mg | ORAL_TABLET | Freq: Two times a day (BID) | ORAL | 1 refills | Status: DC

## 2018-11-10 ENCOUNTER — Ambulatory Visit: Payer: Medicare Other | Admitting: Physician Assistant

## 2018-11-18 ENCOUNTER — Other Ambulatory Visit: Payer: Self-pay

## 2018-11-19 ENCOUNTER — Encounter: Payer: Self-pay | Admitting: Family Medicine

## 2018-11-19 ENCOUNTER — Ambulatory Visit (INDEPENDENT_AMBULATORY_CARE_PROVIDER_SITE_OTHER): Payer: Medicare Other | Admitting: Family Medicine

## 2018-11-19 VITALS — BP 143/80 | HR 61 | Temp 97.8°F | Ht 63.5 in | Wt 145.8 lb

## 2018-11-19 DIAGNOSIS — I10 Essential (primary) hypertension: Secondary | ICD-10-CM

## 2018-11-19 MED ORDER — HYDROCHLOROTHIAZIDE 25 MG PO TABS: 25.0000 mg | ORAL_TABLET | Freq: Every day | ORAL | 2 refills | Status: DC

## 2018-11-19 NOTE — Progress Notes (Signed)
Assessment & Plan:  1. Essential (primary) hypertension - Uncontrolled on metoprolol 25 mg BID, amlodipine 10 mg QD, and furosemide 40 mg QD. I have D/C'd furosemide and rx'd HCTZ 25 mg QD. Education provided on the DASH diet. Encouraged diet and exercise. Unable to increase metoprolol due to heart rate in the 60s. Unable to use ACE/ARBs due to allergy to Losartan. Patient not agreeable to hydralazine since it would have to be QID.  - hydrochlorothiazide (HYDRODIURIL) 25 MG tablet; Take 1 tablet (25 mg total) by mouth daily.  Dispense: 30 tablet; Refill: 2 - BMP8+EGFR   Return for 1 week for labs, then 6 weeks for HTN.  Hendricks Limes, MSN, APRN, FNP-C [facility]  Subjective:    Patient ID: Tamara King, female    DOB: [birth year], 78 y.o.   MRN: [id number]  Patient Care Team: Loman Brooklyn, FNP as PCP - General (Family Medicine)   Chief Complaint:  Chief Complaint  Patient presents with   Hypertension    recheck    HPI: Tamara King is a 78 y.o. female presenting on 11/19/2018 for Hypertension (recheck)  Hypertension: Patient here for follow-up of elevated blood pressure. She is not exercising and is not adherent to low salt diet.  Blood pressure is not well controlled at home. Average systolic BP at home 665L. Cardiac symptoms irregular heart beat and lower extremity edema. Patient denies chest pain, dyspnea and near-syncope.  Cardiovascular risk factors: advanced age (older than 16 for men, 3 for women), dyslipidemia, hypertension and sedentary lifestyle. Use of agents associated with hypertension: none. History of target organ damage: chronic kidney disease and stroke.   New complaints: None  Social history:  Relevant past medical, surgical, family and social history reviewed and updated as indicated. Interim medical history since our last visit reviewed.  Allergies and medications reviewed and updated.  DATA REVIEWED: CHART IN  EPIC  ROS: Negative unless specifically indicated above in HPI.    Current Outpatient Medications:    acetaminophen (TYLENOL) 325 MG tablet, Take 650 mg by mouth every 6 (six) hours as needed for mild pain. For pain, Disp: , Rfl:    amLODipine (NORVASC) 10 MG tablet, Take 1 tablet (10 mg total) by mouth daily., Disp: 30 tablet, Rfl: 2   aspirin EC 81 MG tablet, Take 81 mg by mouth daily., Disp: , Rfl:    cetirizine (ZYRTEC) 10 MG tablet, Take 1 tablet (10 mg total) by mouth daily., Disp: 90 tablet, Rfl: 1   clopidogrel (PLAVIX) 75 MG tablet, Take 1 tablet (75 mg total) by mouth daily., Disp: 90 tablet, Rfl: 1   famotidine (PEPCID) 20 MG tablet, Take 1 tablet (20 mg total) by mouth daily., Disp: 90 tablet, Rfl: 1   fluticasone (FLONASE) 50 MCG/ACT nasal spray, Place 2 sprays into the nose daily as needed for allergies (congestion.). , Disp: , Rfl:    metoprolol tartrate (LOPRESSOR) 25 MG tablet, Take 1 tablet (25 mg total) by mouth 2 (two) times daily., Disp: 180 tablet, Rfl: 1   pantoprazole (PROTONIX) 40 MG tablet, Take 1 tablet (40 mg total) by mouth daily before breakfast., Disp: 90 tablet, Rfl: 1   potassium chloride (K-DUR) 10 MEQ tablet, Take 10 mEq by mouth daily., Disp: , Rfl: 3   pravastatin (PRAVACHOL) 80 MG tablet, Take 1 tablet (80 mg total) by mouth daily., Disp: 90 tablet, Rfl: 1   hydrochlorothiazide (HYDRODIURIL) 25 MG tablet, Take 1 tablet (25 mg total) by mouth  daily., Disp: 30 tablet, Rfl: 2   Allergies  Allergen Reactions   Losartan Anaphylaxis   Sulfa Antibiotics Anaphylaxis and Swelling   Codeine Nausea And Vomiting   Fish Allergy Other (See Comments)    "sick as a dog"   Other Other (See Comments)    Bologna - vomiting   Vancomycin Other (See Comments)    "drives me crazy"   Penicillins Rash    Has patient had a PCN reaction causing immediate rash, facial/tongue/throat swelling, SOB or lightheadedness with hypotension: Unknown Has patient had  a PCN reaction causing severe rash involving mucus membranes or skin necrosis: Unknown Has patient had a PCN reaction that required hospitalization: No Has patient had a PCN reaction occurring within the last 10 years: No Childhood reaction. If all of the above answers are "NO", then may proceed with Cephalosporin use.    Past Medical History:  Diagnosis Date   Breast cancer ([place])    Adenocarcinoma   Chronic edema    CKD (chronic kidney disease) stage 3, GFR 30-59 ml/min 05/25/5463   Complication of anesthesia    Coronary atherosclerosis of native coronary artery    Nonobstructive at catheterization 2003, LVEF normal    Essential hypertension, benign    GERD (gastroesophageal reflux disease) 07/06/2013   Idiopathic peripheral neuropathy 10/06/2013   Mixed hyperlipidemia    Occlusion and stenosis of left carotid artery 10/22/2017   PONV (postoperative nausea and vomiting)    Renal artery stenosis ([facility])    BMS bilateally 2003, PTCA bilaterally 2005 with restenosis   Sleep apnea    Stop Bang score of 4   Stroke [facility])    Symptomatic varicose veins, right 10/16/2018    Past Surgical History:  Procedure Laterality Date   APPENDECTOMY     CATARACT EXTRACTION W/PHACO  03/29/2011   Procedure: CATARACT EXTRACTION PHACO AND INTRAOCULAR LENS PLACEMENT ([place]);  Surgeon: Tonny Branch, MD;  Location: [facility];  Service: Ophthalmology;  Laterality: Right;  CDE:12.82   CATARACT EXTRACTION W/PHACO  04/16/2011   Procedure: CATARACT EXTRACTION PHACO AND INTRAOCULAR LENS PLACEMENT (IOC);  Surgeon: Tonny Branch, MD;  Location: [facility];  Service: Ophthalmology;  Laterality: Left;  CDE: 11.54   CESAREAN SECTION     x2   CHOLECYSTECTOMY  2018   HEMORRHOID SURGERY     Right breast lumpectomy     ROTATOR CUFF REPAIR  2008   UPPER EXTREMITY ANGIOGRAPHY N/A 11/27/2017   Procedure: UPPER EXTREMITY ANGIOGRAPHY;  Surgeon: Marty Heck, MD;  Location: [place] [facility];  Service:  Cardiovascular;  Laterality: N/A;    Social History   Socioeconomic History   Marital status: Widowed    Spouse name: Not on file   Number of children: 2   Years of education: 12th   Highest education level: Not on file  Occupational History    Employer: RETIRED    Comment: n/a  Social Designer, fashion/clothing strain: Not on file   Food insecurity    Worry: Not on file    Inability: Not on file   Transportation needs    Medical: Not on file    Non-medical: Not on file  Tobacco Use   Smoking status: Former Smoker    Packs/day: 1.00    Years: 35.00    Pack years: 35.00    Types: Cigarettes    Quit date: 01/22/1998    Years since quitting: 20.8   Smokeless tobacco: Never Used   Tobacco comment: tobacco use - no  Substance and Sexual Activity   Alcohol use: No   Drug use: No   Sexual activity: Not on file  Lifestyle   Physical activity    Days per week: Not on file    Minutes per session: Not on file   Stress: Not on file  Relationships   Social connections    Talks on phone: Not on file    Gets together: Not on file    Attends religious service: Not on file    Active member of club or organization: Not on file    Attends meetings of clubs or organizations: Not on file    Relationship status: Not on file   Intimate partner violence    Fear of current or ex partner: Not on file    Emotionally abused: Not on file    Physically abused: Not on file    Forced sexual activity: Not on file  Other Topics Concern   Not on file  Social History Narrative   Patient lives at home with family.  Two sons and two granddaughters live with her.   Caffeine use; 2-5 cups daily        Objective:    BP (!) 143/80    Pulse 61    Temp 97.8 F (36.6 C) (Temporal)    Ht 5' 3.5" (1.613 m)    Wt 145 lb 12.8 oz (66.1 kg)    SpO2 96%    BMI 25.42 kg/m   Physical Exam Vitals signs reviewed.  Constitutional:      General: She is not in acute distress.     Appearance: Normal appearance. She is normal weight. She is not ill-appearing, toxic-appearing or diaphoretic.  HENT:     Head: Normocephalic and atraumatic.  Eyes:     General: No scleral icterus.       Right eye: No discharge.        Left eye: No discharge.     Conjunctiva/sclera: Conjunctivae normal.  Neck:     Musculoskeletal: Normal range of motion.  Cardiovascular:     Rate and Rhythm: Normal rate and regular rhythm.     Heart sounds: Normal heart sounds. No murmur. No friction rub. No gallop.   Pulmonary:     Effort: Pulmonary effort is normal. No respiratory distress.     Breath sounds: Normal breath sounds. No stridor. No wheezing, rhonchi or rales.  Musculoskeletal: Normal range of motion.  Skin:    General: Skin is warm and dry.     Capillary Refill: Capillary refill takes less than 2 seconds.  Neurological:     General: No focal deficit present.     Mental Status: She is alert and oriented to person, place, and time. Mental status is at baseline.  Psychiatric:        Mood and Affect: Mood normal.        Behavior: Behavior normal.        Thought Content: Thought content normal.        Judgment: Judgment normal.    Lab Results  Component Value Date   WBC 7.0 06/18/2016   HGB 14.6 11/27/2017   HCT 43.0 11/27/2017   MCV 92.1 06/18/2016   PLT 198 06/18/2016   Lab Results  Component Value Date   NA 143 10/22/2018   K 4.1 10/22/2018   CO2 25 10/22/2018   GLUCOSE 160 (H) 10/22/2018   BUN 19 10/22/2018   CREATININE 1.11 (H) 10/22/2018   BILITOT 0.6 10/22/2018   ALKPHOS  85 10/22/2018   AST 22 10/22/2018   ALT 13 10/22/2018   PROT 6.7 10/22/2018   ALBUMIN 4.5 10/22/2018   CALCIUM 9.3 10/22/2018   ANIONGAP 6 06/18/2016

## 2018-11-19 NOTE — Patient Instructions (Signed)

## 2018-11-26 ENCOUNTER — Other Ambulatory Visit: Payer: Self-pay

## 2018-11-26 ENCOUNTER — Other Ambulatory Visit: Payer: Medicare Other

## 2018-11-27 LAB — BMP8+EGFR
BUN/Creatinine Ratio: 16 (ref 12–28)
BUN: 16 mg/dL (ref 8–27)
CO2: 25 mmol/L (ref 20–29)
Calcium: 10.1 mg/dL (ref 8.7–10.3)
Chloride: 98 mmol/L (ref 96–106)
Creatinine, Ser: 1.01 mg/dL — ABNORMAL HIGH (ref 0.57–1.00)
GFR calc Af Amer: 62 mL/min/{1.73_m2} (ref >59)
GFR calc non Af Amer: 54 mL/min/{1.73_m2} — ABNORMAL LOW (ref >59)
Glucose: 105 mg/dL — ABNORMAL HIGH (ref 65–99)
Potassium: 4 mmol/L (ref 3.5–5.2)
Sodium: 139 mmol/L (ref 134–144)

## 2018-12-08 ENCOUNTER — Ambulatory Visit (INDEPENDENT_AMBULATORY_CARE_PROVIDER_SITE_OTHER): Payer: Medicare Other | Admitting: Nurse Practitioner

## 2018-12-08 ENCOUNTER — Encounter: Payer: Self-pay | Admitting: Nurse Practitioner

## 2018-12-08 DIAGNOSIS — I6522 Occlusion and stenosis of left carotid artery: Secondary | ICD-10-CM

## 2018-12-08 DIAGNOSIS — R300 Dysuria: Secondary | ICD-10-CM

## 2018-12-08 DIAGNOSIS — R110 Nausea: Secondary | ICD-10-CM

## 2018-12-08 MED ORDER — CIPROFLOXACIN HCL 500 MG PO TABS: 500.0000 mg | ORAL_TABLET | Freq: Two times a day (BID) | ORAL | 0 refills | Status: DC

## 2018-12-08 MED ORDER — ONDANSETRON HCL 4 MG PO TABS: 4.0000 mg | ORAL_TABLET | Freq: Three times a day (TID) | ORAL | 0 refills | Status: DC | PRN

## 2018-12-08 NOTE — Progress Notes (Signed)
Virtual Visit via telephone Note Due to COVID-19 pandemic this visit was conducted virtually. This visit type was conducted due to national recommendations for restrictions regarding the COVID-19 Pandemic (e.g. social distancing, sheltering in place) in an effort to limit this patient's exposure and mitigate transmission in our community. All issues noted in this document were discussed and addressed.  A physical exam was not performed with this format.  I connected with Tamara King on 12/08/18 at 8:35 by telephone and verified that I am speaking with the correct person using two identifiers. Tamara King is currently located at home and her husband is currently with her during visit. The provider, Mary-Margaret Hassell Done, FNP is located in their office at time of visit.  I discussed the limitations, risks, security and privacy concerns of performing an evaluation and management service by telephone and the availability of in person appointments. I also discussed with the patient that there may be a patient responsible charge related to this service. The patient expressed understanding and agreed to proceed.   History and Present Illness:   Chief Complaint: URI   HPI Patient calls in today c/o of not feeling well. She is c/o nausea that started 2-3 days ago. She is also having trouble getiing urine flow started. She does having dysuria when she voids. Only voiding scant amounts.   Review of Systems  Constitutional: Negative for chills, diaphoresis, fever and weight loss.  Eyes: Negative for blurred vision, double vision and pain.  Respiratory: Negative for shortness of breath.   Cardiovascular: Negative for chest pain, palpitations, orthopnea and leg swelling.  Gastrointestinal: Positive for nausea. Negative for abdominal pain.  Genitourinary: Positive for dysuria, frequency and urgency.  Skin: Negative for rash.  Neurological: Negative.  Negative for dizziness, sensory change, loss  of consciousness, weakness and headaches.  Endo/Heme/Allergies: Negative for polydipsia. Does not bruise/bleed easily.  Psychiatric/Behavioral: Negative.  Negative for memory loss. The patient does not have insomnia.   All other systems reviewed and are negative.    Observations/Objective: Alert and oriented- answers all questions appropriately No distress    Assessment and Plan: Tamara King in today with chief complaint of URI   1. Dysuria Take medication as prescribe Cotton underwear Take shower not bath Cranberry juice, yogurt Force fluids AZO over the counter X2 days RTO prn  - ciprofloxacin (CIPRO) 500 MG tablet; Take 1 tablet (500 mg total) by mouth 2 (two) times daily.  Dispense: 10 tablet; Refill: 0  2. Nausea Bland diet - ondansetron (ZOFRAN) 4 MG tablet; Take 1 tablet (4 mg total) by mouth every 8 (eight) hours as needed for nausea or vomiting.  Dispense: 20 tablet; Refill: 0   Follow Up Instructions: prn    I discussed the assessment and treatment plan with the patient. The patient was provided an opportunity to ask questions and all were answered. The patient agreed with the plan and demonstrated an understanding of the instructions.   The patient was advised to call back or seek an in-person evaluation if the symptoms worsen or if the condition fails to improve as anticipated.  The above assessment and management plan was discussed with the patient. The patient verbalized understanding of and has agreed to the management plan. Patient is aware to call the clinic if symptoms persist or worsen. Patient is aware when to return to the clinic for a follow-up visit. Patient educated on when it is appropriate to go to the emergency department.   Time call ended:  8:50  I provided 15 minutes of non-face-to-face time during this encounter.    Mary-Margaret Hassell Done, FNP

## 2018-12-29 NOTE — Progress Notes (Signed)
Assessment & Plan:  1. Essential (primary) hypertension - Uncontrolled. HCTZ and potassium supplement discontinued. Started patient on spironolactone 25 mg PO QD. Continue metoprolol 25 mg twice daily and amlodipine 10 mg daily. BMP at next visit.  - spironolactone (ALDACTONE) 25 MG tablet; Take 1 tablet (25 mg total) by mouth daily.  Dispense: 30 tablet; Refill: 2   Return in about 2 weeks (around 01/14/2019) for HTN (w. labs).  Hendricks Limes, MSN, APRN, FNP-C [facility]  Subjective:    Patient ID: Tamara King, female    DOB: 1940/12/28, 78 y.o.   MRN: [id number]  Patient Care Team: Loman Brooklyn, FNP as PCP - General (Family Medicine)   Chief Complaint:  Chief Complaint  Patient presents with  . Hypertension    six week recheck    HPI: Tamara King is a 78 y.o. female presenting on 12/31/2018 for Hypertension (six week recheck)  Hypertension: Patient here for follow-up of elevated blood pressure.   At her last visit on 11/19/2018 she was continued on metoprolol 25 mg twice daily and amlodipine 10 mg daily.  Furosemide was discontinued and hydrochlorothiazide 25 mg daily was added.  Unable to increase metoprolol due to heart rate in the 60s.  Unable to use ACE/ARB due to an allergy to losartan.  At that time she was not agreeable to hydralazine since it would have to be 4 times daily.  Patient has not been taking hydrochlorothiazide as she feels like it caused her to have constipation.  She is not exercising and is adherent to low salt diet.  Patient denies chest pain, dyspnea and near-syncope.  Cardiovascular risk factors: advanced age (older than 59 for men, 78 for women), dyslipidemia, hypertension and sedentary lifestyle. Use of agents associated with hypertension: none. History of target organ damage: chronic kidney disease and stroke.  New complaints: None  Social history:  Relevant past medical, surgical, family and social history reviewed  and updated as indicated. Interim medical history since our last visit reviewed.  Allergies and medications reviewed and updated.  DATA REVIEWED: CHART IN EPIC  ROS: Negative unless specifically indicated above in HPI.    Current Outpatient Medications:  .  acetaminophen (TYLENOL) 325 MG tablet, Take 650 mg by mouth every 6 (six) hours as needed for mild pain. For pain, Disp: , Rfl:  .  amLODipine (NORVASC) 10 MG tablet, Take 1 tablet (10 mg total) by mouth daily., Disp: 30 tablet, Rfl: 2 .  aspirin EC 81 MG tablet, Take 81 mg by mouth daily., Disp: , Rfl:  .  cetirizine (ZYRTEC) 10 MG tablet, Take 1 tablet (10 mg total) by mouth daily., Disp: 90 tablet, Rfl: 1 .  clopidogrel (PLAVIX) 75 MG tablet, Take 1 tablet (75 mg total) by mouth daily., Disp: 90 tablet, Rfl: 1 .  famotidine (PEPCID) 20 MG tablet, Take 1 tablet (20 mg total) by mouth daily., Disp: 90 tablet, Rfl: 1 .  fluticasone (FLONASE) 50 MCG/ACT nasal spray, Place 2 sprays into the nose daily as needed for allergies (congestion.). , Disp: , Rfl:  .  metoprolol tartrate (LOPRESSOR) 25 MG tablet, Take 1 tablet (25 mg total) by mouth 2 (two) times daily., Disp: 180 tablet, Rfl: 1 .  ondansetron (ZOFRAN) 4 MG tablet, Take 1 tablet (4 mg total) by mouth every 8 (eight) hours as needed for nausea or vomiting., Disp: 20 tablet, Rfl: 0 .  pantoprazole (PROTONIX) 40 MG tablet, Take 1 tablet (40 mg total) by mouth  daily before breakfast., Disp: 90 tablet, Rfl: 1 .  pravastatin (PRAVACHOL) 80 MG tablet, Take 1 tablet (80 mg total) by mouth daily., Disp: 90 tablet, Rfl: 1 .  spironolactone (ALDACTONE) 25 MG tablet, Take 1 tablet (25 mg total) by mouth daily., Disp: 30 tablet, Rfl: 2   Allergies  Allergen Reactions  . Losartan Anaphylaxis  . Sulfa Antibiotics Anaphylaxis and Swelling  . Codeine Nausea And Vomiting  . Fish Allergy Other (See Comments)    "sick as a dog"  . Other Other (See Comments)    Bologna - vomiting  . Vancomycin  Other (See Comments)    "drives me crazy"  . Penicillins Rash    Has patient had a PCN reaction causing immediate rash, facial/tongue/throat swelling, SOB or lightheadedness with hypotension: Unknown Has patient had a PCN reaction causing severe rash involving mucus membranes or skin necrosis: Unknown Has patient had a PCN reaction that required hospitalization: No Has patient had a PCN reaction occurring within the last 10 years: No Childhood reaction. If all of the above answers are "NO", then may proceed with Cephalosporin use.    Past Medical History:  Diagnosis Date  . Breast cancer ([place])    Adenocarcinoma  . Chronic edema   . CKD (chronic kidney disease) stage 3, GFR 30-59 ml/min 10/22/2018  . Complication of anesthesia   . Coronary atherosclerosis of native coronary artery    Nonobstructive at catheterization 2003, LVEF normal   . Essential hypertension, benign   . GERD (gastroesophageal reflux disease) 07/06/2013  . Idiopathic peripheral neuropathy 10/06/2013  . Mixed hyperlipidemia   . Occlusion and stenosis of left carotid artery 10/22/2017  . PONV (postoperative nausea and vomiting)   . Renal artery stenosis ([facility])    BMS bilateally 2003, PTCA bilaterally 2005 with restenosis  . Sleep apnea    Stop Bang score of 4  . Stroke ([place])   . Symptomatic varicose veins, right 10/16/2018    Past Surgical History:  Procedure Laterality Date  . APPENDECTOMY    . CATARACT EXTRACTION W/PHACO  03/29/2011   Procedure: CATARACT EXTRACTION PHACO AND INTRAOCULAR LENS PLACEMENT (IOC);  Surgeon: Tonny Branch, MD;  Location: [facility];  Service: Ophthalmology;  Laterality: Right;  CDE:12.82  . CATARACT EXTRACTION W/PHACO  04/16/2011   Procedure: CATARACT EXTRACTION PHACO AND INTRAOCULAR LENS PLACEMENT (IOC);  Surgeon: Tonny Branch, MD;  Location: [facility];  Service: Ophthalmology;  Laterality: Left;  CDE: 11.54  . CESAREAN SECTION     x2  . CHOLECYSTECTOMY  2018  . HEMORRHOID SURGERY    . Right breast  lumpectomy    . ROTATOR CUFF REPAIR  2008  . UPPER EXTREMITY ANGIOGRAPHY N/A 11/27/2017   Procedure: UPPER EXTREMITY ANGIOGRAPHY;  Surgeon: Marty Heck, MD;  Location: [facility];  Service: Cardiovascular;  Laterality: N/A;    Social History   Socioeconomic History  . Marital status: Widowed    Spouse name: Not on file  . Number of children: 2  . Years of education: 12th  . Highest education level: Not on file  Occupational History    Employer: RETIRED    Comment: n/a  Social Needs  . Financial resource strain: Not on file  . Food insecurity    Worry: Not on file    Inability: Not on file  . Transportation needs    Medical: Not on file    Non-medical: Not on file  Tobacco Use  . Smoking status: Former Smoker    Packs/day:  1.00    Years: 35.00    Pack years: 35.00    Types: Cigarettes    Quit date: 01/22/1998    Years since quitting: 20.9  . Smokeless tobacco: Never Used  . Tobacco comment: tobacco use - no  Substance and Sexual Activity  . Alcohol use: No  . Drug use: No  . Sexual activity: Not on file  Lifestyle  . Physical activity    Days per week: Not on file    Minutes per session: Not on file  . Stress: Not on file  Relationships  . Social Herbalist on phone: Not on file    Gets together: Not on file    Attends religious service: Not on file    Active member of club or organization: Not on file    Attends meetings of clubs or organizations: Not on file    Relationship status: Not on file  . Intimate partner violence    Fear of current or ex partner: Not on file    Emotionally abused: Not on file    Physically abused: Not on file    Forced sexual activity: Not on file  Other Topics Concern  . Not on file  Social History Narrative   Patient lives at home with family.  Two sons and two granddaughters live with her.   Caffeine use; 2-5 cups daily        Objective:    BP (!) 151/67   Pulse 66   Temp 98.7 F (37.1 C)  (Temporal)   Ht 5' 3.5" (1.613 m)   Wt 145 lb (65.8 kg)   SpO2 96%   BMI 25.28 kg/m   Physical Exam Vitals signs reviewed.  Constitutional:      General: She is not in acute distress.    Appearance: Normal appearance. She is normal weight. She is not ill-appearing, toxic-appearing or diaphoretic.  HENT:     Head: Normocephalic and atraumatic.  Eyes:     General: No scleral icterus.       Right eye: No discharge.        Left eye: No discharge.     Conjunctiva/sclera: Conjunctivae normal.  Neck:     Musculoskeletal: Normal range of motion.  Cardiovascular:     Rate and Rhythm: Normal rate and regular rhythm.     Heart sounds: Normal heart sounds. No murmur. No friction rub. No gallop.   Pulmonary:     Effort: Pulmonary effort is normal. No respiratory distress.     Breath sounds: Normal breath sounds. No stridor. No wheezing, rhonchi or rales.  Musculoskeletal: Normal range of motion.  Skin:    General: Skin is warm and dry.     Capillary Refill: Capillary refill takes less than 2 seconds.  Neurological:     General: No focal deficit present.     Mental Status: She is alert and oriented to person, place, and time. Mental status is at baseline.     Gait: Gait abnormal.  Psychiatric:        Mood and Affect: Mood normal.        Behavior: Behavior normal.        Thought Content: Thought content normal.        Judgment: Judgment normal.     No results found for: TSH Lab Results  Component Value Date   WBC 7.0 06/18/2016   HGB 14.6 11/27/2017   HCT 43.0 11/27/2017   MCV 92.1 06/18/2016   PLT  198 06/18/2016   Lab Results  Component Value Date   NA 139 11/26/2018   K 4.0 11/26/2018   CO2 25 11/26/2018   GLUCOSE 105 (H) 11/26/2018   BUN 16 11/26/2018   CREATININE 1.01 (H) 11/26/2018   BILITOT 0.6 10/22/2018   ALKPHOS 85 10/22/2018   AST 22 10/22/2018   ALT 13 10/22/2018   PROT 6.7 10/22/2018   ALBUMIN 4.5 10/22/2018   CALCIUM 10.1 11/26/2018   ANIONGAP 6  06/18/2016   No results found for: CHOL No results found for: HDL No results found for: LDLCALC No results found for: TRIG No results found for: CHOLHDL No results found for: HGBA1C

## 2018-12-30 ENCOUNTER — Ambulatory Visit: Payer: Medicare Other | Admitting: Vascular Surgery

## 2018-12-30 ENCOUNTER — Other Ambulatory Visit: Payer: Self-pay

## 2018-12-31 ENCOUNTER — Encounter: Payer: Self-pay | Admitting: Family Medicine

## 2018-12-31 ENCOUNTER — Ambulatory Visit (INDEPENDENT_AMBULATORY_CARE_PROVIDER_SITE_OTHER): Payer: Medicare Other | Admitting: Family Medicine

## 2018-12-31 VITALS — BP 151/67 | HR 66 | Temp 98.7°F | Ht 63.5 in | Wt 145.0 lb

## 2018-12-31 DIAGNOSIS — I10 Essential (primary) hypertension: Secondary | ICD-10-CM | POA: Diagnosis not present

## 2018-12-31 MED ORDER — SPIRONOLACTONE 25 MG PO TABS: 25.0000 mg | ORAL_TABLET | Freq: Every day | ORAL | 2 refills | Status: DC

## 2019-01-01 ENCOUNTER — Other Ambulatory Visit: Payer: Self-pay

## 2019-01-01 DIAGNOSIS — I6522 Occlusion and stenosis of left carotid artery: Secondary | ICD-10-CM

## 2019-01-02 ENCOUNTER — Other Ambulatory Visit: Payer: Self-pay

## 2019-01-02 ENCOUNTER — Ambulatory Visit (HOSPITAL_COMMUNITY)
Admission: RE | Admit: 2019-01-02 | Discharge: 2019-01-02 | Disposition: A | Discharge status: Home / Self Care | Payer: Medicare Other | Source: Ambulatory Visit | Attending: Family | Admitting: Family

## 2019-01-02 ENCOUNTER — Encounter: Payer: Self-pay | Admitting: *Deleted

## 2019-01-02 DIAGNOSIS — I6522 Occlusion and stenosis of left carotid artery: Secondary | ICD-10-CM

## 2019-01-06 ENCOUNTER — Ambulatory Visit (INDEPENDENT_AMBULATORY_CARE_PROVIDER_SITE_OTHER): Payer: Medicare Other | Admitting: Vascular Surgery

## 2019-01-06 ENCOUNTER — Other Ambulatory Visit: Payer: Self-pay

## 2019-01-06 ENCOUNTER — Encounter: Payer: Self-pay | Admitting: Vascular Surgery

## 2019-01-06 DIAGNOSIS — I6523 Occlusion and stenosis of bilateral carotid arteries: Secondary | ICD-10-CM

## 2019-01-06 NOTE — Progress Notes (Signed)
Virtual Visit via Telephone Note   I connected with Tamara King on 01/06/2019  by telephone and verified that I was speaking with the correct person using two identifiers. Patient was located at home and accompanied by no one. I am located at [employer].   The limitations of evaluation and management by telemedicine and the availability of in person appointments have been previously discussed with the patient and are documented in the patients chart. The patient expressed understanding and consented to proceed.  PCP: Loman Brooklyn, FNP   Chief Complaint: Extracranial cerebrovascular occlusive disease  History of Present Illness: Tamara King is a 78 y.o. female with known carotid disease.  Prior imaging revealed occluded common and internal carotid on the left.  She had innominate stenosis.  No focal symptoms related to this.  CT scan 2019 revealed this.  She did undergo catheter-based arteriography showing no critical innominate stenosis and it was felt best to continue observation only.  She does have chronic dizziness.  Past Medical History:  Diagnosis Date  . Breast cancer ([place])    Adenocarcinoma  . Chronic edema   . CKD (chronic kidney disease) stage 3, GFR 30-59 ml/min 10/22/2018  . Complication of anesthesia   . Coronary atherosclerosis of native coronary artery    Nonobstructive at catheterization 2003, LVEF normal   . Essential hypertension, benign   . GERD (gastroesophageal reflux disease) 07/06/2013  . Idiopathic peripheral neuropathy 10/06/2013  . Mixed hyperlipidemia   . Occlusion and stenosis of left carotid artery 10/22/2017  . PONV (postoperative nausea and vomiting)   . Renal artery stenosis ([facility])    BMS bilateally 2003, PTCA bilaterally 2005 with restenosis  . Sleep apnea    Stop Bang score of 4  . Stroke ([place])   . Symptomatic varicose veins, right 10/16/2018    Past Surgical History:  Procedure Laterality Date  . APPENDECTOMY    . CATARACT  EXTRACTION W/PHACO  03/29/2011   Procedure: CATARACT EXTRACTION PHACO AND INTRAOCULAR LENS PLACEMENT (IOC);  Surgeon: Tonny Branch, MD;  Location: [facility];  Service: Ophthalmology;  Laterality: Right;  CDE:12.82  . CATARACT EXTRACTION W/PHACO  04/16/2011   Procedure: CATARACT EXTRACTION PHACO AND INTRAOCULAR LENS PLACEMENT (IOC);  Surgeon: Tonny Branch, MD;  Location: [facility];  Service: Ophthalmology;  Laterality: Left;  CDE: 11.54  . CESAREAN SECTION     x2  . CHOLECYSTECTOMY  2018  . HEMORRHOID SURGERY    . Right breast lumpectomy    . ROTATOR CUFF REPAIR  2008  . UPPER EXTREMITY ANGIOGRAPHY N/A 11/27/2017   Procedure: UPPER EXTREMITY ANGIOGRAPHY;  Surgeon: Marty Heck, MD;  Location: [facility];  Service: Cardiovascular;  Laterality: N/A;    No outpatient medications have been marked as taking for the 01/06/19 encounter (Office Visit) with Rosetta Posner, MD.    12 system ROS was negative unless otherwise noted in HPI   Observations/Objective: Patient underwent repeat duplex evaluation on 1211 and I reviewed this with her.  This showed known occlusion of her left common carotid and internal carotid artery.  No significant stenosis in her right carotid artery  Assessment and Plan: Known occlusion of left common and internal carotid with no evidence of right carotid stenosis.  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 The patient was  provided an opportunity to ask questions and all were answered. The patient agreed with the plan and demonstrated an understanding of the instructions.   The patient was advised to call back or seek an in-person evaluation if the symptoms worsen or if the condition fails to improve as anticipated.  I spent 5-10 minutes with the patient via telephone encounter.   Annamary Rummage [facility] Office: [phone]  01/06/2019, 4:24 PM

## 2019-01-07 ENCOUNTER — Other Ambulatory Visit: Payer: Self-pay | Admitting: *Deleted

## 2019-01-07 DIAGNOSIS — I6523 Occlusion and stenosis of bilateral carotid arteries: Secondary | ICD-10-CM

## 2019-01-13 ENCOUNTER — Other Ambulatory Visit: Payer: Self-pay

## 2019-01-14 ENCOUNTER — Encounter: Payer: Self-pay | Admitting: Family Medicine

## 2019-01-14 ENCOUNTER — Ambulatory Visit (INDEPENDENT_AMBULATORY_CARE_PROVIDER_SITE_OTHER): Payer: Medicare Other | Admitting: Family Medicine

## 2019-01-14 VITALS — BP 166/78 | HR 67 | Temp 97.8°F | Ht 63.5 in | Wt 144.4 lb

## 2019-01-14 DIAGNOSIS — R252 Cramp and spasm: Secondary | ICD-10-CM

## 2019-01-14 DIAGNOSIS — Z23 Encounter for immunization: Secondary | ICD-10-CM

## 2019-01-14 DIAGNOSIS — I10 Essential (primary) hypertension: Secondary | ICD-10-CM | POA: Diagnosis not present

## 2019-01-14 MED ORDER — SPIRONOLACTONE 50 MG PO TABS: 50.0000 mg | ORAL_TABLET | Freq: Every day | ORAL | 2 refills | Status: DC

## 2019-01-14 NOTE — Progress Notes (Signed)
Assessment & Plan:  1. Uncontrolled hypertension -Patient to continue amlodipine 10 mg once daily and metoprolol 25 mg twice daily.  I have increase spironolactone from 25 mg to 50 mg once daily.  I am placing a referral to cardiology due to her uncontrolled hypertension and inability to get her at goal. - BMP8+EGFR - spironolactone (ALDACTONE) 50 MG tablet; Take 1 tablet (50 mg total) by mouth daily.  Dispense: 30 tablet; Refill: 2 - Ambulatory referral to Cardiology  2. Leg cramping - BMP8+EGFR - Magnesium  PCV13 given today in office.  Return in about 3 months (around 04/14/2019) for follow-up of chronic medication conditions.  Hendricks Limes, MSN, APRN, FNP-C [facility]  Subjective:    Patient ID: Tamara King, female    DOB: October 04, 1940, 78 y.o.   MRN: [id number]  Patient Care Team: Loman Brooklyn, FNP as PCP - General (Family Medicine)   Chief Complaint:  Chief Complaint  Patient presents with  . Hypertension    HPI: Tamara King is a 78 y.o. female presenting on 01/14/2019 for Hypertension  Hypertension: Patient here for follow-up of elevated blood pressure.  At her last visit on 12/31/2018 she was continued on metoprolol 25 mg twice daily and amlodipine 10 mg daily.    Hydrochlorothiazide and potassium supplement were discontinued as patient was refusing to take it due to side effects.  She was started on spironolactone 25 mg once daily. Unable to increase metoprolol due to heart rate in the 60s.  Unable to use ACE/ARB due to an allergy to losartan.    Patient has not been agreeable to hydralazine due to frequency of use. Sheis notexercising and isadherent to low salt diet.  Patient reports she does check her blood pressure at home and gets readings similar to ours. Patient denieschest pain, dyspnea and near-syncope. Cardiovascular risk factors: advanced age (older than 18 for men, 63 for women), dyslipidemia, hypertension and sedentary  lifestyle. Use of agents associated with hypertension:none. History of target organ damage:chronic kidney disease and stroke.  New complaints: Patient reports leg cramps that started a few nights ago.  Social history:  Relevant past medical, surgical, family and social history reviewed and updated as indicated. Interim medical history since our last visit reviewed.  Allergies and medications reviewed and updated.  DATA REVIEWED: CHART IN EPIC  ROS: Negative unless specifically indicated above in HPI.    Current Outpatient Medications:  .  acetaminophen (TYLENOL) 325 MG tablet, Take 650 mg by mouth every 6 (six) hours as needed for mild pain. For pain, Disp: , Rfl:  .  amLODipine (NORVASC) 10 MG tablet, Take 1 tablet (10 mg total) by mouth daily., Disp: 30 tablet, Rfl: 2 .  aspirin EC 81 MG tablet, Take 81 mg by mouth daily., Disp: , Rfl:  .  cetirizine (ZYRTEC) 10 MG tablet, Take 1 tablet (10 mg total) by mouth daily., Disp: 90 tablet, Rfl: 1 .  clopidogrel (PLAVIX) 75 MG tablet, Take 1 tablet (75 mg total) by mouth daily., Disp: 90 tablet, Rfl: 1 .  famotidine (PEPCID) 20 MG tablet, Take 1 tablet (20 mg total) by mouth daily., Disp: 90 tablet, Rfl: 1 .  fluticasone (FLONASE) 50 MCG/ACT nasal spray, Place 2 sprays into the nose daily as needed for allergies (congestion.). , Disp: , Rfl:  .  metoprolol tartrate (LOPRESSOR) 25 MG tablet, Take 1 tablet (25 mg total) by mouth 2 (two) times daily., Disp: 180 tablet, Rfl: 1 .  ondansetron (ZOFRAN)  4 MG tablet, Take 1 tablet (4 mg total) by mouth every 8 (eight) hours as needed for nausea or vomiting., Disp: 20 tablet, Rfl: 0 .  pantoprazole (PROTONIX) 40 MG tablet, Take 1 tablet (40 mg total) by mouth daily before breakfast., Disp: 90 tablet, Rfl: 1 .  pravastatin (PRAVACHOL) 80 MG tablet, Take 1 tablet (80 mg total) by mouth daily., Disp: 90 tablet, Rfl: 1 .  spironolactone (ALDACTONE) 50 MG tablet, Take 1 tablet (50 mg total) by mouth  daily., Disp: 30 tablet, Rfl: 2   Allergies  Allergen Reactions  . Losartan Anaphylaxis  . Sulfa Antibiotics Anaphylaxis and Swelling  . Codeine Nausea And Vomiting  . Fish Allergy Other (See Comments)    "sick as a dog"  . Other Other (See Comments)    Bologna - vomiting  . Vancomycin Other (See Comments)    "drives me crazy"  . Penicillins Rash    Has patient had a PCN reaction causing immediate rash, facial/tongue/throat swelling, SOB or lightheadedness with hypotension: Unknown Has patient had a PCN reaction causing severe rash involving mucus membranes or skin necrosis: Unknown Has patient had a PCN reaction that required hospitalization: No Has patient had a PCN reaction occurring within the last 10 years: No Childhood reaction. If all of the above answers are "NO", then may proceed with Cephalosporin use.    Past Medical History:  Diagnosis Date  . Breast cancer ([place])    Adenocarcinoma  . Chronic edema   . CKD (chronic kidney disease) stage 3, GFR 30-59 ml/min 10/22/2018  . Complication of anesthesia   . Coronary atherosclerosis of native coronary artery    Nonobstructive at catheterization 2003, LVEF normal   . Essential hypertension, benign   . GERD (gastroesophageal reflux disease) 07/06/2013  . Idiopathic peripheral neuropathy 10/06/2013  . Mixed hyperlipidemia   . Occlusion and stenosis of left carotid artery 10/22/2017  . PONV (postoperative nausea and vomiting)   . Renal artery stenosis (HCC)    BMS bilateally 2003, PTCA bilaterally 2005 with restenosis  . Sleep apnea    Stop Bang score of 4  . Stroke ([place])   . Symptomatic varicose veins, right 10/16/2018    Past Surgical History:  Procedure Laterality Date  . APPENDECTOMY    . CATARACT EXTRACTION W/PHACO  03/29/2011   Procedure: CATARACT EXTRACTION PHACO AND INTRAOCULAR LENS PLACEMENT (IOC);  Surgeon: Tonny Branch, MD;  Location: [facility];  Service: Ophthalmology;  Laterality: Right;  CDE:12.82  . CATARACT  EXTRACTION W/PHACO  04/16/2011   Procedure: CATARACT EXTRACTION PHACO AND INTRAOCULAR LENS PLACEMENT (IOC);  Surgeon: Tonny Branch, MD;  Location: [facility];  Service: Ophthalmology;  Laterality: Left;  CDE: 11.54  . CESAREAN SECTION     x2  . CHOLECYSTECTOMY  2018  . HEMORRHOID SURGERY    . Right breast lumpectomy    . ROTATOR CUFF REPAIR  2008  . UPPER EXTREMITY ANGIOGRAPHY N/A 11/27/2017   Procedure: UPPER EXTREMITY ANGIOGRAPHY;  Surgeon: Marty Heck, MD;  Location: [facility];  Service: Cardiovascular;  Laterality: N/A;    Social History   Socioeconomic History  . Marital status: Widowed    Spouse name: Not on file  . Number of children: 2  . Years of education: 12th  . Highest education level: Not on file  Occupational History    Employer: RETIRED    Comment: n/a  Tobacco Use  . Smoking status: Former Smoker    Packs/day: 1.00    Years: 35.00  Pack years: 35.00    Types: Cigarettes    Quit date: 01/22/1998    Years since quitting: 20.9  . Smokeless tobacco: Never Used  . Tobacco comment: tobacco use - no  Substance and Sexual Activity  . Alcohol use: No  . Drug use: No  . Sexual activity: Not on file  Other Topics Concern  . Not on file  Social History Narrative   Patient lives at home with family.  Two sons and two granddaughters live with her.   Caffeine use; 2-5 cups daily   Social Determinants of Health   Financial Resource Strain:   . Difficulty of Paying Living Expenses: Not on file  Food Insecurity:   . Worried About Charity fundraiser in the Last Year: Not on file  . Ran Out of Food in the Last Year: Not on file  Transportation Needs:   . Lack of Transportation (Medical): Not on file  . Lack of Transportation (Non-Medical): Not on file  Physical Activity:   . Days of Exercise per Week: Not on file  . Minutes of Exercise per Session: Not on file  Stress:   . Feeling of Stress : Not on file  Social Connections:   . Frequency of  Communication with Friends and Family: Not on file  . Frequency of Social Gatherings with Friends and Family: Not on file  . Attends Religious Services: Not on file  . Active Member of Clubs or Organizations: Not on file  . Attends Archivist Meetings: Not on file  . Marital Status: Not on file  Intimate Partner Violence:   . Fear of Current or Ex-Partner: Not on file  . Emotionally Abused: Not on file  . Physically Abused: Not on file  . Sexually Abused: Not on file        Objective:    BP (!) 166/78 Comment: manual  Pulse 67   Temp 97.8 F (36.6 C) (Temporal)   Ht 5' 3.5" (1.613 m)   Wt 144 lb 6.4 oz (65.5 kg)   SpO2 97%   BMI 25.18 kg/m   Physical Exam Vitals reviewed.  Constitutional:      General: She is not in acute distress.    Appearance: Normal appearance. She is normal weight. She is not ill-appearing, toxic-appearing or diaphoretic.  HENT:     Head: Normocephalic and atraumatic.  Eyes:     General: No scleral icterus.       Right eye: No discharge.        Left eye: No discharge.     Conjunctiva/sclera: Conjunctivae normal.  Cardiovascular:     Rate and Rhythm: Normal rate and regular rhythm.     Heart sounds: Normal heart sounds. No murmur. No friction rub. No gallop.   Pulmonary:     Effort: Pulmonary effort is normal. No respiratory distress.     Breath sounds: Normal breath sounds. No stridor. No wheezing, rhonchi or rales.  Musculoskeletal:        General: Normal range of motion.     Cervical back: Normal range of motion.  Skin:    General: Skin is warm and dry.     Capillary Refill: Capillary refill takes less than 2 seconds.  Neurological:     General: No focal deficit present.     Mental Status: She is alert and oriented to person, place, and time. Mental status is at baseline.  Psychiatric:        Mood and Affect: Mood normal.  Behavior: Behavior normal.        Thought Content: Thought content normal.        Judgment:  Judgment normal.     No results found for: TSH Lab Results  Component Value Date   WBC 7.0 06/18/2016   HGB 14.6 11/27/2017   HCT 43.0 11/27/2017   MCV 92.1 06/18/2016   PLT 198 06/18/2016   Lab Results  Component Value Date   NA 139 11/26/2018   K 4.0 11/26/2018   CO2 25 11/26/2018   GLUCOSE 105 (H) 11/26/2018   BUN 16 11/26/2018   CREATININE 1.01 (H) 11/26/2018   BILITOT 0.6 10/22/2018   ALKPHOS 85 10/22/2018   AST 22 10/22/2018   ALT 13 10/22/2018   PROT 6.7 10/22/2018   ALBUMIN 4.5 10/22/2018   CALCIUM 10.1 11/26/2018   ANIONGAP 6 06/18/2016   No results found for: CHOL No results found for: HDL No results found for: LDLCALC No results found for: TRIG No results found for: CHOLHDL No results found for: HGBA1C

## 2019-01-15 ENCOUNTER — Other Ambulatory Visit: Payer: Self-pay | Admitting: Family Medicine

## 2019-01-15 DIAGNOSIS — I10 Essential (primary) hypertension: Secondary | ICD-10-CM

## 2019-01-15 LAB — BMP8+EGFR
BUN/Creatinine Ratio: 16 (ref 12–28)
BUN: 24 mg/dL (ref 8–27)
CO2: 20 mmol/L (ref 20–29)
Calcium: 9.7 mg/dL (ref 8.7–10.3)
Chloride: 100 mmol/L (ref 96–106)
Creatinine, Ser: 1.51 mg/dL — ABNORMAL HIGH (ref 0.57–1.00)
GFR calc Af Amer: 38 mL/min/{1.73_m2} — ABNORMAL LOW (ref >59)
GFR calc non Af Amer: 33 mL/min/{1.73_m2} — ABNORMAL LOW (ref >59)
Glucose: 160 mg/dL — ABNORMAL HIGH (ref 65–99)
Potassium: 4.5 mmol/L (ref 3.5–5.2)
Sodium: 138 mmol/L (ref 134–144)

## 2019-01-15 LAB — MAGNESIUM: Magnesium: 2.1 mg/dL (ref 1.6–2.3)

## 2019-01-15 MED ORDER — HYDRALAZINE HCL 10 MG PO TABS: 10.0000 mg | ORAL_TABLET | Freq: Four times a day (QID) | ORAL | 0 refills | Status: DC

## 2019-01-19 ENCOUNTER — Other Ambulatory Visit: Payer: Self-pay

## 2019-01-19 ENCOUNTER — Other Ambulatory Visit: Payer: Medicare Other

## 2019-01-19 DIAGNOSIS — I10 Essential (primary) hypertension: Secondary | ICD-10-CM

## 2019-01-20 ENCOUNTER — Encounter: Payer: Self-pay | Admitting: Family Medicine

## 2019-01-20 LAB — BMP8+EGFR
BUN/Creatinine Ratio: 13 (ref 12–28)
BUN: 16 mg/dL (ref 8–27)
CO2: 22 mmol/L (ref 20–29)
Calcium: 9.9 mg/dL (ref 8.7–10.3)
Chloride: 102 mmol/L (ref 96–106)
Creatinine, Ser: 1.22 mg/dL — ABNORMAL HIGH (ref 0.57–1.00)
GFR calc Af Amer: 50 mL/min/{1.73_m2} — ABNORMAL LOW (ref >59)
GFR calc non Af Amer: 43 mL/min/{1.73_m2} — ABNORMAL LOW (ref >59)
Glucose: 107 mg/dL — ABNORMAL HIGH (ref 65–99)
Potassium: 4.5 mmol/L (ref 3.5–5.2)
Sodium: 140 mmol/L (ref 134–144)

## 2019-01-28 ENCOUNTER — Other Ambulatory Visit: Payer: Self-pay | Admitting: Family Medicine

## 2019-01-28 DIAGNOSIS — I10 Essential (primary) hypertension: Secondary | ICD-10-CM

## 2019-02-04 ENCOUNTER — Telehealth: Payer: Self-pay | Admitting: Family Medicine

## 2019-02-04 NOTE — Telephone Encounter (Signed)
Dr. Donnetta Hutching, Good evening. Would you mind giving your advise for this patient?  Hendricks Limes, MSN, APRN, FNP-C [facility]

## 2019-02-04 NOTE — Telephone Encounter (Signed)
Please review and advise.

## 2019-02-04 NOTE — Telephone Encounter (Signed)
Tamara King called from [facility] stating that they sent a request to [employer] yesterday stating that patient has a colonscopy coming up with Dr Gwenlyn Found and needs to verify if it is ok if patient stops taking her Plavix for 5 days.

## 2019-02-12 NOTE — Telephone Encounter (Signed)
Yes, it is okay for patient to stop Plavix for 5 days prior to her procedure.

## 2019-02-12 NOTE — Telephone Encounter (Signed)
Lisa aware, Ms Blonigen can stop plavix five days prior to colonoscopy.

## 2019-02-18 ENCOUNTER — Ambulatory Visit (INDEPENDENT_AMBULATORY_CARE_PROVIDER_SITE_OTHER): Payer: Medicare Other | Admitting: Family Medicine

## 2019-02-18 ENCOUNTER — Encounter: Payer: Self-pay | Admitting: Family Medicine

## 2019-02-18 DIAGNOSIS — J988 Other specified respiratory disorders: Secondary | ICD-10-CM

## 2019-02-18 DIAGNOSIS — R110 Nausea: Secondary | ICD-10-CM

## 2019-02-18 DIAGNOSIS — B9689 Other specified bacterial agents as the cause of diseases classified elsewhere: Secondary | ICD-10-CM | POA: Diagnosis not present

## 2019-02-18 MED ORDER — ALBUTEROL SULFATE HFA 108 (90 BASE) MCG/ACT IN AERS: 2.0000 | INHALATION_SPRAY | Freq: Four times a day (QID) | RESPIRATORY_TRACT | 0 refills | Status: DC | PRN

## 2019-02-18 MED ORDER — AZITHROMYCIN 250 MG PO TABS: ORAL_TABLET | ORAL | 0 refills | Status: DC

## 2019-02-18 MED ORDER — ONDANSETRON HCL 4 MG PO TABS: 4.0000 mg | ORAL_TABLET | Freq: Three times a day (TID) | ORAL | 0 refills | Status: DC | PRN

## 2019-02-18 NOTE — Progress Notes (Signed)
Virtual Visit via Telephone Note  I connected with Tamara King on 02/18/19 at 9:03 AM by telephone and verified that I am speaking with the correct person using two identifiers. Tamara King is currently located at home and nobody is currently with her during this visit. The provider, Loman Brooklyn, FNP is located in their office at time of visit.  I discussed the limitations, risks, security and privacy concerns of performing an evaluation and management service by telephone and the availability of in person appointments. I also discussed with the patient that there may be a patient responsible charge related to this service. The patient expressed understanding and agreed to proceed.  Subjective: PCP: Loman Brooklyn, FNP  Chief Complaint  Patient presents with  . URI   Patient complains of cough, chest congestion, headache, runny nose, sneezing, postnasal drainage, shortness of breath, wheezing and nausea.  Onset of symptoms was 1 week ago, unchanged since that time. She is drinking plenty of fluids. Evaluation to date: none. Treatment to date: Mucinex and Zofran. She has a history of allergies. She does not smoke.    ROS: Per HPI  Current Outpatient Medications:  .  acetaminophen (TYLENOL) 325 MG tablet, Take 650 mg by mouth every 6 (six) hours as needed for mild pain. For pain, Disp: , Rfl:  .  amLODipine (NORVASC) 10 MG tablet, Take 1 tablet by mouth once daily, Disp: 90 tablet, Rfl: 0 .  aspirin EC 81 MG tablet, Take 81 mg by mouth daily., Disp: , Rfl:  .  cetirizine (ZYRTEC) 10 MG tablet, Take 1 tablet (10 mg total) by mouth daily., Disp: 90 tablet, Rfl: 1 .  clopidogrel (PLAVIX) 75 MG tablet, Take 1 tablet (75 mg total) by mouth daily., Disp: 90 tablet, Rfl: 1 .  famotidine (PEPCID) 20 MG tablet, Take 1 tablet (20 mg total) by mouth daily., Disp: 90 tablet, Rfl: 1 .  fluticasone (FLONASE) 50 MCG/ACT nasal spray, Place 2 sprays into the nose daily as needed for allergies  (congestion.). , Disp: , Rfl:  .  hydrALAZINE (APRESOLINE) 10 MG tablet, Take 1 tablet (10 mg total) by mouth 4 (four) times daily., Disp: 120 tablet, Rfl: 0 .  metoprolol tartrate (LOPRESSOR) 25 MG tablet, Take 1 tablet (25 mg total) by mouth 2 (two) times daily., Disp: 180 tablet, Rfl: 1 .  ondansetron (ZOFRAN) 4 MG tablet, Take 1 tablet (4 mg total) by mouth every 8 (eight) hours as needed for nausea or vomiting., Disp: 20 tablet, Rfl: 0 .  pantoprazole (PROTONIX) 40 MG tablet, Take 1 tablet (40 mg total) by mouth daily before breakfast., Disp: 90 tablet, Rfl: 1 .  pravastatin (PRAVACHOL) 80 MG tablet, Take 1 tablet (80 mg total) by mouth daily., Disp: 90 tablet, Rfl: 1  Allergies  Allergen Reactions  . Losartan Anaphylaxis  . Sulfa Antibiotics Anaphylaxis and Swelling  . Codeine Nausea And Vomiting  . Fish Allergy Other (See Comments)    "sick as a dog"  . Other Other (See Comments)    Bologna - vomiting  . Spironolactone Other (See Comments)    Acute kidney injury  . Vancomycin Other (See Comments)    "drives me crazy"  . Penicillins Rash    Has patient had a PCN reaction causing immediate rash, facial/tongue/throat swelling, SOB or lightheadedness with hypotension: Unknown Has patient had a PCN reaction causing severe rash involving mucus membranes or skin necrosis: Unknown Has patient had a PCN reaction that required hospitalization: No  Has patient had a PCN reaction occurring within the last 10 years: No Childhood reaction. If all of the above answers are "NO", then may proceed with Cephalosporin use.    Past Medical History:  Diagnosis Date  . Breast cancer ([place])    Adenocarcinoma  . Chronic edema   . CKD (chronic kidney disease) stage 3, GFR 30-59 ml/min 10/22/2018  . Complication of anesthesia   . Coronary atherosclerosis of native coronary artery    Nonobstructive at catheterization 2003, LVEF normal   . Essential hypertension, benign   . GERD (gastroesophageal  reflux disease) 07/06/2013  . Idiopathic peripheral neuropathy 10/06/2013  . Mixed hyperlipidemia   . Occlusion and stenosis of left carotid artery 10/22/2017  . PONV (postoperative nausea and vomiting)   . Renal artery stenosis (HCC)    BMS bilateally 2003, PTCA bilaterally 2005 with restenosis  . Sleep apnea    Stop Bang score of 4  . Stroke ([place])   . Symptomatic varicose veins, right 10/16/2018    Observations/Objective: A&O  No respiratory distress or wheezing audible over the phone. Patient does have a cough. Mood, judgement, and thought processes all WNL  Assessment and Plan: 1. Bacterial respiratory infection - Patient does not with to be tested for COVID-19.  Encouraged use of Robitussin for cough, Tylenol for any aches/pains/fever, Mucinex twice daily for chest congestion with a full glass of water, albuterol for wheezing and shortness of breath, and Zofran for nausea.  Patient to continue Zyrtec and Flonase. - albuterol (VENTOLIN HFA) 108 (90 Base) MCG/ACT inhaler; Inhale 2 puffs into the lungs every 6 (six) hours as needed for wheezing or shortness of breath.  Dispense: 18 g; Refill: 0 - azithromycin (ZITHROMAX Z-PAK) 250 MG tablet; Take 2 tablets (500 mg) PO today, then 1 tablet (250 mg) PO daily x4 days.  Dispense: 6 tablet; Refill: 0  2. Nausea - ondansetron (ZOFRAN) 4 MG tablet; Take 1 tablet (4 mg total) by mouth every 8 (eight) hours as needed for nausea or vomiting.  Dispense: 20 tablet; Refill: 0   Follow Up Instructions:  I discussed the assessment and treatment plan with the patient. The patient was provided an opportunity to ask questions and all were answered. The patient agreed with the plan and demonstrated an understanding of the instructions.   The patient was advised to call back or seek an in-person evaluation if the symptoms worsen or if the condition fails to improve as anticipated.  The above assessment and management plan was discussed with the patient.  The patient verbalized understanding of and has agreed to the management plan. Patient is aware to call the clinic if symptoms persist or worsen. Patient is aware when to return to the clinic for a follow-up visit. Patient educated on when it is appropriate to go to the emergency department.   Time call ended: 9:12 AM  I provided 12 minutes of non-face-to-face time during this encounter.  Hendricks Limes, MSN, APRN, FNP-C [facility] 02/18/19

## 2019-02-23 ENCOUNTER — Other Ambulatory Visit: Payer: Self-pay

## 2019-02-23 ENCOUNTER — Ambulatory Visit (INDEPENDENT_AMBULATORY_CARE_PROVIDER_SITE_OTHER): Payer: Medicare Other | Admitting: Cardiology

## 2019-02-23 ENCOUNTER — Telehealth: Payer: Self-pay | Admitting: Cardiology

## 2019-02-23 ENCOUNTER — Encounter: Payer: Self-pay | Admitting: Cardiology

## 2019-02-23 VITALS — BP 150/60 | HR 63 | Ht 63.5 in | Wt 144.0 lb

## 2019-02-23 DIAGNOSIS — E782 Mixed hyperlipidemia: Secondary | ICD-10-CM

## 2019-02-23 DIAGNOSIS — I10 Essential (primary) hypertension: Secondary | ICD-10-CM

## 2019-02-23 DIAGNOSIS — I6523 Occlusion and stenosis of bilateral carotid arteries: Secondary | ICD-10-CM | POA: Diagnosis not present

## 2019-02-23 DIAGNOSIS — Z8679 Personal history of other diseases of the circulatory system: Secondary | ICD-10-CM

## 2019-02-23 MED ORDER — HYDRALAZINE HCL 10 MG PO TABS: 20.0000 mg | ORAL_TABLET | Freq: Three times a day (TID) | ORAL | 6 refills | Status: DC

## 2019-02-23 NOTE — Patient Instructions (Signed)
Medication Instructions:   Increase Hydralazine to 20mg  three times per day.  Continue all other medications.    Labwork:  BMET - order given today.   Will do at primary care office.   Please do just prior to next office visit.   Testing/Procedures:  Your physician has requested that you have a renal artery duplex. During this test, an ultrasound is used to evaluate blood flow to the kidneys. Allow one hour for this exam. Do not eat after midnight the day before and avoid carbonated beverages. Take your medications as you usually do.  Office will contact with results via phone or letter.    Follow-Up: 4 weeks   Any Other Special Instructions Will Be Listed Below (If Applicable).  If you need a refill on your cardiac medications before your next appointment, please call your pharmacy.

## 2019-02-23 NOTE — Telephone Encounter (Signed)
Pre-cert Verification for the following procedure    renal artery doppler - h/o renal artery stenosis scheduled for 02/25/2019 at [facility]

## 2019-02-23 NOTE — Progress Notes (Signed)
Cardiology Office Note  Date: 02/23/2019   ID: Keola, Rabanales 1940-03-18, MRN [id number]  PCP:  Loman Brooklyn, FNP  Consulting Cardiologist:  Rozann Lesches, MD Electrophysiologist:  None   Chief Complaint  Patient presents with  . Reestablish cardiology follow-up    History of Present Illness: Tamara King is a 79 y.o. female presenting in referral from Ms. Blanch Media NP to reestablish cardiology follow-up.  She was last seen in 2013.  She has a history of renal artery stenosis status post stent intervention as well as mild to moderate carotid artery disease.  She is undergoing medication adjustments and better blood pressure control per chart review.  It looks like she was initially on spironolactone which was increased, although discontinued apparently secondary to renal insufficiency.  Hydralazine is her most recent medication, she has otherwise been on Norvasc and Lopressor.  She states that leg swelling has worsened since being taken off spironolactone.  In the past, we had her on Lasix.  Carotid Dopplers from December 2020 are outlined below.  She has not had renal Dopplers in several years.  She has remained on aspirin and Plavix as well as statin therapy.  I personally reviewed her ECG today which shows sinus rhythm with left bundle branch block.  Past Medical History:  Diagnosis Date  . Breast cancer ([place])    Adenocarcinoma  . Chronic edema   . CKD (chronic kidney disease) stage 3, GFR 30-59 ml/min 10/22/2018  . Coronary atherosclerosis of native coronary artery    Nonobstructive at catheterization 2003, LVEF normal   . Essential hypertension   . GERD (gastroesophageal reflux disease) 07/06/2013  . Idiopathic peripheral neuropathy 10/06/2013  . Mixed hyperlipidemia   . Occlusion and stenosis of left carotid artery 10/22/2017  . Renal artery stenosis ([facility])    BMS bilateally 2003, PTCA bilaterally 2005 with restenosis  . Sleep apnea    Stop Bang score of 4  .  Stroke ([place])   . Symptomatic varicose veins, right 10/16/2018    Past Surgical History:  Procedure Laterality Date  . APPENDECTOMY    . CATARACT EXTRACTION W/PHACO  03/29/2011   Procedure: CATARACT EXTRACTION PHACO AND INTRAOCULAR LENS PLACEMENT (IOC);  Surgeon: Tonny Branch, MD;  Location: [facility];  Service: Ophthalmology;  Laterality: Right;  CDE:12.82  . CATARACT EXTRACTION W/PHACO  04/16/2011   Procedure: CATARACT EXTRACTION PHACO AND INTRAOCULAR LENS PLACEMENT (IOC);  Surgeon: Tonny Branch, MD;  Location: [facility];  Service: Ophthalmology;  Laterality: Left;  CDE: 11.54  . CESAREAN SECTION     x2  . CHOLECYSTECTOMY  2018  . HEMORRHOID SURGERY    . Right breast lumpectomy    . ROTATOR CUFF REPAIR  2008  . UPPER EXTREMITY ANGIOGRAPHY N/A 11/27/2017   Procedure: UPPER EXTREMITY ANGIOGRAPHY;  Surgeon: Marty Heck, MD;  Location: [facility];  Service: Cardiovascular;  Laterality: N/A;    Current Outpatient Medications  Medication Sig Dispense Refill  . acetaminophen (TYLENOL) 325 MG tablet Take 650 mg by mouth every 6 (six) hours as needed for mild pain. For pain    . albuterol (VENTOLIN HFA) 108 (90 Base) MCG/ACT inhaler Inhale 2 puffs into the lungs every 6 (six) hours as needed for wheezing or shortness of breath. 18 g 0  . amLODipine (NORVASC) 10 MG tablet Take 1 tablet by mouth once daily 90 tablet 0  . aspirin EC 81 MG tablet Take 81 mg by mouth daily.    . cetirizine (  ZYRTEC) 10 MG tablet Take 1 tablet (10 mg total) by mouth daily. 90 tablet 1  . clopidogrel (PLAVIX) 75 MG tablet Take 1 tablet (75 mg total) by mouth daily. 90 tablet 1  . famotidine (PEPCID) 20 MG tablet Take 1 tablet (20 mg total) by mouth daily. 90 tablet 1  . fluticasone (FLONASE) 50 MCG/ACT nasal spray Place 2 sprays into the nose daily as needed for allergies (congestion.).     Marland Kitchen hydrALAZINE (APRESOLINE) 10 MG tablet Take 2 tablets (20 mg total) by mouth 3 (three) times daily. 120 tablet 6  .  metoprolol tartrate (LOPRESSOR) 25 MG tablet Take 1 tablet (25 mg total) by mouth 2 (two) times daily. 180 tablet 1  . ondansetron (ZOFRAN) 4 MG tablet Take 1 tablet (4 mg total) by mouth every 8 (eight) hours as needed for nausea or vomiting. 20 tablet 0  . pantoprazole (PROTONIX) 40 MG tablet Take 1 tablet (40 mg total) by mouth daily before breakfast. 90 tablet 1  . pravastatin (PRAVACHOL) 80 MG tablet Take 1 tablet (80 mg total) by mouth daily. 90 tablet 1   No current facility-administered medications for this visit.   Allergies:  Losartan, Sulfa antibiotics, Codeine, Fish allergy, Other, Spironolactone, Vancomycin, and Penicillins   Social History: The patient  reports that she quit smoking about 21 years ago. Her smoking use included cigarettes. She has a 35.00 pack-year smoking history. She has never used smokeless tobacco. She reports that she does not drink alcohol or use drugs.   Family History: The patient's family history includes Brain cancer in her brother and sister; COPD in her sister; Diabetes in her brother, brother, father, sister, son, and son; Heart failure in her mother; Lung cancer in her father; Stroke (age of onset: 47) in her mother.   ROS:  Please see the history of present illness. Otherwise, complete review of systems is positive for none.  All other systems are reviewed and negative.   Physical Exam: VS:  BP (!) 150/60   Pulse 63   Ht 5' 3.5" (1.613 m)   Wt 144 lb (65.3 kg)   SpO2 95%   BMI 25.11 kg/m , BMI Body mass index is 25.11 kg/m.  Wt Readings from Last 3 Encounters:  02/23/19 144 lb (65.3 kg)  01/14/19 144 lb 6.4 oz (65.5 kg)  12/31/18 145 lb (65.8 kg)    General: Elderly woman, appears comfortable at rest. HEENT: Conjunctiva and lids normal, wearing a mask. Neck: Supple, no elevated JVP, bilateral carotid bruits, no thyromegaly. Lungs: Clear to auscultation, nonlabored breathing at rest. Cardiac: Regular rate and rhythm, no S3, soft systolic  murmur, no pericardial rub. Abdomen: Soft, nontender, no bruits, bowel sounds present. Extremities: 2+ lower leg edema and venous stasis, distal pulses 2+. Skin: Warm and dry. Musculoskeletal: No kyphosis. Neuropsychiatric: Alert and oriented x3, affect grossly appropriate.  ECG:  An ECG dated 11/27/2017 was personally reviewed today and demonstrated:  Sinus bradycardia with prolonged PR interval and left bundle branch block.  Recent Labwork: 10/22/2018: ALT 13; AST 22 01/14/2019: Magnesium 2.1 01/19/2019: BUN 16; Creatinine, Ser 1.22; Potassium 4.5; Sodium 140   Other Studies Reviewed Today:  Carotid Dopplers 01/02/2019: Summary:  Right Carotid: Velocities in the right ICA are consistent with a 1-39%  stenosis.         Stenosis a the origin of the innominate artery.   Left Carotid: Evidence consistent with a total occlusion of the left ICA.  The  ECA appears >50% stenosed.   Vertebrals: Bilateral vertebral arteries demonstrate antegrade flow.  Subclavians: Right subclavian artery was stenotic. Normal flow  hemodynamics were        seen in the left subclavian artery.   Echocardiogram 06/17/2016: Study Conclusions   - Left ventricle: The cavity size was normal. There was mild focal  basal hypertrophy of the septum. Systolic function was vigorous.  The estimated ejection fraction was in the range of 65% to 70%.  Wall motion was normal; there were no regional wall motion  abnormalities. Doppler parameters are consistent with abnormal  left ventricular relaxation (grade 1 diastolic dysfunction).  There was no evidence of elevated ventricular filling pressure by  Doppler parameters.  - Aortic valve: There was no regurgitation.  - Aortic root: The aortic root was normal in size.  - Mitral valve: There was mild regurgitation.  - Left atrium: The atrium was normal in size.  - Right ventricle: Systolic function was normal.  - Tricuspid  valve: There was trivial regurgitation.  - Pulmonic valve: There was trivial regurgitation.  - Pulmonary arteries: Systolic pressure was mildly increased. PA  peak pressure: 36 mm Hg (S).  - Inferior vena cava: The vessel was normal in size.  - Pericardium, extracardiac: There was no pericardial effusion.   Assessment and Plan:  1.  Essential hypertension, blood pressure trend up per chart review.  Continue Norvasc and Lopressor.  She states that she has not been able to take hydralazine 4 times a day, we will change dose to 20 mg 3 times a day and titrate from there.  2.  History of renal artery stenosis status post bilateral stent intervention.  She has not had recent follow-up renal artery Dopplers and these will be arranged.  Check BMET for next office visit.  3.  Bilateral leg swelling.  States that this has been worse since coming off spironolactone.  She had been able to tolerate Lasix in the past.  We will follow up on renal artery Dopplers first and then potentially start her on low-dose Lasix.  4.  Carotid artery disease with occlusion of the LICA and mild disease on the right.  Continue dual antiplatelet regimen and statin.  She has not had a recent lipid profile, this will be addressed going forward as well.  Medication Adjustments/Labs and Tests Ordered: Current medicines are reviewed at length with the patient today.  Concerns regarding medicines are outlined above.   Tests Ordered: Orders Placed This Encounter  Procedures  . Basic metabolic panel  . EKG 12-Lead  . VAS US RENAL ARTERY DUPLEX    Medication Changes: Meds ordered this encounter  Medications  . hydrALAZINE (APRESOLINE) 10 MG tablet    Sig: Take 2 tablets (20 mg total) by mouth 3 (three) times daily.    Dispense:  120 tablet    Refill:  6    Dose increased 02/23/2019    Disposition:  Follow up 4 weeks in the [place] office.  Signed, Satira Sark, MD, [facility] 02/23/2019 11:07 [place] at [address] [place], [place], [place] [address] Phone: [phone]; Fax: [phone]

## 2019-02-25 ENCOUNTER — Other Ambulatory Visit: Payer: Self-pay | Admitting: Cardiology

## 2019-02-25 ENCOUNTER — Ambulatory Visit (INDEPENDENT_AMBULATORY_CARE_PROVIDER_SITE_OTHER): Payer: Medicare Other

## 2019-02-25 ENCOUNTER — Other Ambulatory Visit: Payer: Self-pay

## 2019-02-25 DIAGNOSIS — I701 Atherosclerosis of renal artery: Secondary | ICD-10-CM

## 2019-02-27 ENCOUNTER — Telehealth: Payer: Self-pay | Admitting: *Deleted

## 2019-02-27 DIAGNOSIS — Z8679 Personal history of other diseases of the circulatory system: Secondary | ICD-10-CM

## 2019-02-27 NOTE — Telephone Encounter (Signed)
Patient informed and verbalized understanding of plan. Copy sent to PCP 

## 2019-02-27 NOTE — Telephone Encounter (Signed)
-----   Message from Satira Sark, MD sent at 02/26/2019 10:10 AM EST ----- Results reviewed.  Patient seen recently to reestablish follow-up.  She has a known history of renal artery stenosis with previous stent intervention bilaterally and restenosis as of 2005.  In light of present findings would recommend that we arrange follow-up with our vascular division (Dr. Gwenlyn Found or Dr. Fletcher Anon) for further evaluation.

## 2019-03-04 ENCOUNTER — Encounter: Payer: Self-pay | Admitting: Family Medicine

## 2019-03-04 ENCOUNTER — Ambulatory Visit (INDEPENDENT_AMBULATORY_CARE_PROVIDER_SITE_OTHER): Payer: Medicare Other | Admitting: Family Medicine

## 2019-03-04 ENCOUNTER — Other Ambulatory Visit: Payer: Self-pay

## 2019-03-04 VITALS — BP 168/71 | HR 63 | Temp 96.8°F | Ht 63.5 in | Wt 145.6 lb

## 2019-03-04 DIAGNOSIS — R600 Localized edema: Secondary | ICD-10-CM | POA: Diagnosis not present

## 2019-03-04 MED ORDER — FUROSEMIDE 20 MG PO TABS: 20.0000 mg | ORAL_TABLET | Freq: Every day | ORAL | 0 refills | Status: DC

## 2019-03-04 NOTE — Progress Notes (Signed)
Assessment & Plan:  1. Lower extremity edema - Discussed with patient that we could do a short course of furosemide to diurese her and make her more comfortable but that she could not be on it daily due to her kidneys.  BMP today and again on Friday to reassess kidney function.  Recommended that she follow-up with vascular as recently ordered.  Discussed we may need to get nephrology involved at some point but that we would wait until after she has a work-up with vascular guarding the renal artery stenosis.  Encourage patient to elevate her lower extremities as much as possible, limit salt, and wear compression stockings daily.  Education provided on edema. - BMP8+EGFR - furosemide (LASIX) 20 MG tablet; Take 1 tablet (20 mg total) by mouth daily for 3 days.  Dispense: 10 tablet; Refill: 0 - BMP8+EGFR; Future   Follow up plan: Return Friday for labs.  Hendricks Limes, MSN, APRN, FNP-C [facility]  Subjective:   Patient ID: Tamara King, female    DOB: 05-Feb-1940, 79 y.o.   MRN: [id number]  HPI: Tamara King is a 79 y.o. female presenting on 03/04/2019 for Leg Swelling (both legs and feet started when they took her off her fluid pill. Was taking off because it was stressing Kidneys )  Patient has a history of uncontrolled hypertension.  She is unable to tolerate ACE/ARB's due to an anaphylactic reaction to losartan.  When I first met patient on 10/22/2018 she was taking furosemide 40 mg once daily for lower extremity edema.  Due to her continued uncontrolled hypertension I discontinued her furosemide and started her on hydrochlorothiazide 25 mg once daily on 11/19/2018.  Patient quit taking hydrochlorothiazide on her own as she felt it caused her to have constipation.  Given this, I then started her on spironolactone 25 mg once daily on 12/31/2018 which then had to be discontinued due to acute kidney injury.  Patient was referred to cardiology where work-up revealed renal  artery stenosis.  She was referred to their vascular division for further evaluation.  Patient reports she has not yet had this appointment.  She reports her legs are swelling and hurting.   ROS: Negative unless specifically indicated above in HPI.   Relevant past medical history reviewed and updated as indicated.   Allergies and medications reviewed and updated.   Current Outpatient Medications:  .  acetaminophen (TYLENOL) 325 MG tablet, Take 650 mg by mouth every 6 (six) hours as needed for mild pain. For pain, Disp: , Rfl:  .  albuterol (VENTOLIN HFA) 108 (90 Base) MCG/ACT inhaler, Inhale 2 puffs into the lungs every 6 (six) hours as needed for wheezing or shortness of breath., Disp: 18 g, Rfl: 0 .  amLODipine (NORVASC) 10 MG tablet, Take 1 tablet by mouth once daily, Disp: 90 tablet, Rfl: 0 .  aspirin EC 81 MG tablet, Take 81 mg by mouth daily., Disp: , Rfl:  .  cetirizine (ZYRTEC) 10 MG tablet, Take 1 tablet (10 mg total) by mouth daily., Disp: 90 tablet, Rfl: 1 .  clopidogrel (PLAVIX) 75 MG tablet, Take 1 tablet (75 mg total) by mouth daily., Disp: 90 tablet, Rfl: 1 .  famotidine (PEPCID) 20 MG tablet, Take 1 tablet (20 mg total) by mouth daily., Disp: 90 tablet, Rfl: 1 .  fluticasone (FLONASE) 50 MCG/ACT nasal spray, Place 2 sprays into the nose daily as needed for allergies (congestion.). , Disp: , Rfl:  .  hydrALAZINE (APRESOLINE) 10 MG  tablet, Take 2 tablets (20 mg total) by mouth 3 (three) times daily., Disp: 120 tablet, Rfl: 6 .  metoprolol tartrate (LOPRESSOR) 25 MG tablet, Take 1 tablet (25 mg total) by mouth 2 (two) times daily., Disp: 180 tablet, Rfl: 1 .  ondansetron (ZOFRAN) 4 MG tablet, Take 1 tablet (4 mg total) by mouth every 8 (eight) hours as needed for nausea or vomiting., Disp: 20 tablet, Rfl: 0 .  pantoprazole (PROTONIX) 40 MG tablet, Take 1 tablet (40 mg total) by mouth daily before breakfast., Disp: 90 tablet, Rfl: 1 .  pravastatin (PRAVACHOL) 80 MG tablet, Take 1  tablet (80 mg total) by mouth daily., Disp: 90 tablet, Rfl: 1 .  furosemide (LASIX) 20 MG tablet, Take 1 tablet (20 mg total) by mouth daily for 3 days., Disp: 10 tablet, Rfl: 0  Allergies  Allergen Reactions  . Losartan Anaphylaxis  . Sulfa Antibiotics Anaphylaxis and Swelling  . Codeine Nausea And Vomiting  . Fish Allergy Other (See Comments)    "sick as a dog"  . Other Other (See Comments)    Bologna - vomiting  . Spironolactone Other (See Comments)    Acute kidney injury  . Vancomycin Other (See Comments)    "drives me crazy"  . Penicillins Rash    Has patient had a PCN reaction causing immediate rash, facial/tongue/throat swelling, SOB or lightheadedness with hypotension: Unknown Has patient had a PCN reaction causing severe rash involving mucus membranes or skin necrosis: Unknown Has patient had a PCN reaction that required hospitalization: No Has patient had a PCN reaction occurring within the last 10 years: No Childhood reaction. If all of the above answers are "NO", then may proceed with Cephalosporin use.     Objective:   BP (!) 168/71   Pulse 63   Temp (!) 96.8 F (36 C) (Temporal)   Ht 5' 3.5" (1.613 m)   Wt 145 lb 9.6 oz (66 kg)   SpO2 98%   BMI 25.39 kg/m    Physical Exam Vitals reviewed.  Constitutional:      General: She is not in acute distress.    Appearance: Normal appearance. She is not ill-appearing, toxic-appearing or diaphoretic.  HENT:     Head: Normocephalic and atraumatic.  Eyes:     General: No scleral icterus.       Right eye: No discharge.        Left eye: No discharge.     Conjunctiva/sclera: Conjunctivae normal.  Cardiovascular:     Rate and Rhythm: Normal rate.  Pulmonary:     Effort: Pulmonary effort is normal. No respiratory distress.  Musculoskeletal:        General: Normal range of motion.     Cervical back: Normal range of motion.     Right lower leg: 3+ Edema present.     Left lower leg: 3+ Edema present.  Skin:     General: Skin is warm and dry.     Capillary Refill: Capillary refill takes less than 2 seconds.  Neurological:     General: No focal deficit present.     Mental Status: She is alert and oriented to person, place, and time. Mental status is at baseline.  Psychiatric:        Mood and Affect: Mood normal.        Behavior: Behavior normal.        Thought Content: Thought content normal.        Judgment: Judgment normal.

## 2019-03-04 NOTE — Patient Instructions (Signed)
Edema  Edema is when you have too much fluid in your body or under your skin. Edema may make your legs, feet, and ankles swell up. Swelling is also common in looser tissues, like around your eyes. This is a common condition. It gets more common as you get older. There are many possible causes of edema. Eating too much salt (sodium) and being on your feet or sitting for a long time can cause edema in your legs, feet, and ankles. Hot weather may make edema worse. Edema is usually painless. Your skin may look swollen or shiny. Follow these instructions at home:  Keep the swollen body part raised (elevated) above the level of your heart when you are sitting or lying down.  Do not sit still or stand for a long time.  Do not wear tight clothes. Do not wear garters on your upper legs.  Exercise your legs. This can help the swelling go down.  Wear elastic bandages or support stockings as told by your doctor.  Eat a low-salt (low-sodium) diet to reduce fluid as told by your doctor.  Depending on the cause of your swelling, you may need to limit how much fluid you drink (fluid restriction).  Take over-the-counter and prescription medicines only as told by your doctor. Contact a doctor if:  Treatment is not working.  You have heart, liver, or kidney disease and have symptoms of edema.  You have sudden and unexplained weight gain. Get help right away if:  You have shortness of breath or chest pain.  You cannot breathe when you lie down.  You have pain, redness, or warmth in the swollen areas.  You have heart, liver, or kidney disease and get edema all of a sudden.  You have a fever and your symptoms get worse all of a sudden. Summary  Edema is when you have too much fluid in your body or under your skin.  Edema may make your legs, feet, and ankles swell up. Swelling is also common in looser tissues, like around your eyes.  Raise (elevate) the swollen body part above the level of your  heart when you are sitting or lying down.  Follow your doctor's instructions about diet and how much fluid you can drink (fluid restriction). This information is not intended to replace advice given to you by your health care provider. Make sure you discuss any questions you have with your health care provider. Document Revised: 01/11/2017 Document Reviewed: 01/27/2016 Elsevier Patient Education  2020 [employer]  

## 2019-03-05 ENCOUNTER — Encounter: Payer: Self-pay | Admitting: Family Medicine

## 2019-03-05 LAB — BMP8+EGFR
BUN/Creatinine Ratio: 14 (ref 12–28)
BUN: 14 mg/dL (ref 8–27)
CO2: 21 mmol/L (ref 20–29)
Calcium: 9.7 mg/dL (ref 8.7–10.3)
Chloride: 104 mmol/L (ref 96–106)
Creatinine, Ser: 0.99 mg/dL (ref 0.57–1.00)
GFR calc Af Amer: 64 mL/min/{1.73_m2} (ref >59)
GFR calc non Af Amer: 56 mL/min/{1.73_m2} — ABNORMAL LOW (ref >59)
Glucose: 96 mg/dL (ref 65–99)
Potassium: 4.3 mmol/L (ref 3.5–5.2)
Sodium: 142 mmol/L (ref 134–144)

## 2019-03-06 ENCOUNTER — Other Ambulatory Visit: Payer: Medicare Other

## 2019-03-06 ENCOUNTER — Other Ambulatory Visit: Payer: Self-pay

## 2019-03-06 DIAGNOSIS — R600 Localized edema: Secondary | ICD-10-CM

## 2019-03-07 LAB — BMP8+EGFR
BUN/Creatinine Ratio: 13 (ref 12–28)
BUN: 18 mg/dL (ref 8–27)
CO2: 23 mmol/L (ref 20–29)
Calcium: 9.3 mg/dL (ref 8.7–10.3)
Chloride: 103 mmol/L (ref 96–106)
Creatinine, Ser: 1.34 mg/dL — ABNORMAL HIGH (ref 0.57–1.00)
GFR calc Af Amer: 44 mL/min/{1.73_m2} — ABNORMAL LOW (ref >59)
GFR calc non Af Amer: 39 mL/min/{1.73_m2} — ABNORMAL LOW (ref >59)
Glucose: 111 mg/dL — ABNORMAL HIGH (ref 65–99)
Potassium: 4.1 mmol/L (ref 3.5–5.2)
Sodium: 143 mmol/L (ref 134–144)

## 2019-03-09 ENCOUNTER — Other Ambulatory Visit: Payer: Self-pay | Admitting: Family Medicine

## 2019-03-09 DIAGNOSIS — N1832 Chronic kidney disease, stage 3b: Secondary | ICD-10-CM

## 2019-03-09 DIAGNOSIS — R600 Localized edema: Secondary | ICD-10-CM

## 2019-03-10 ENCOUNTER — Ambulatory Visit (INDEPENDENT_AMBULATORY_CARE_PROVIDER_SITE_OTHER): Payer: Medicare Other | Admitting: Cardiovascular Disease

## 2019-03-10 ENCOUNTER — Other Ambulatory Visit: Payer: Self-pay

## 2019-03-10 ENCOUNTER — Encounter: Payer: Self-pay | Admitting: Cardiovascular Disease

## 2019-03-10 VITALS — BP 150/68 | HR 65 | Temp 97.9°F | Ht 63.5 in | Wt 145.4 lb

## 2019-03-10 DIAGNOSIS — E785 Hyperlipidemia, unspecified: Secondary | ICD-10-CM

## 2019-03-10 DIAGNOSIS — I701 Atherosclerosis of renal artery: Secondary | ICD-10-CM | POA: Diagnosis not present

## 2019-03-10 DIAGNOSIS — I779 Disorder of arteries and arterioles, unspecified: Secondary | ICD-10-CM

## 2019-03-10 MED ORDER — CARVEDILOL 6.25 MG PO TABS: 6.2500 mg | ORAL_TABLET | Freq: Two times a day (BID) | ORAL | 3 refills | Status: DC

## 2019-03-10 NOTE — Progress Notes (Signed)
Cardiology Office Note   Date:  03/10/2019   ID:  Mckala, Fontenot 09-08-1940, MRN [id number]  PCP:  Loman Brooklyn, FNP  Cardiologist: Dr. Domenic Polite  No chief complaint on file.     History of Present Illness: Tamara King is a 79 y.o. female who was referred by Dr. Domenic Polite for evaluation of renal artery stenosis. She has history of mild nonobstructive coronary artery disease on previous cardiac catheterization in 2003.  She has known history of renal artery stenosis.  She underwent bilateral renal artery stenting in December 2003.  She had restenosis in 2005 and had repeat angiography by Dr. Albertine Patricia.  She underwent bilateral angioplasty for in-stent restenosis.  No reintervention since then.  She has chronic medical conditions that include difficult to control hypertension, hyperlipidemia, carotid disease with known occluded left carotid artery, mild chronic kidney disease, previous tobacco use and left bundle branch block. She has intolerance to multiple antihypertensive medications according to the notes.  She is not able to tolerate ACE inhibitors or ARB due to an allergic reaction.  Hydrochlorothiazide caused constipation.  Spironolactone caused worsening renal function.  Most recently, amlodipine was increased to 10 mg daily.  However, she complains of worsening bilateral leg edema with significant tenderness.  She has mild exertional dyspnea with no chest pain. She had recent renal artery duplex which showed significant restenosis only on the left side.  The patient has no history of heart failure.   Past Medical History:  Diagnosis Date  . Breast cancer ([place])    Adenocarcinoma  . Chronic edema   . CKD (chronic kidney disease) stage 3, GFR 30-59 ml/min 10/22/2018  . Coronary atherosclerosis of native coronary artery    Nonobstructive at catheterization 2003, LVEF normal   . Essential hypertension   . GERD (gastroesophageal reflux disease) 07/06/2013  . Idiopathic  peripheral neuropathy 10/06/2013  . Mixed hyperlipidemia   . Occlusion and stenosis of left carotid artery 10/22/2017  . Renal artery stenosis ([facility])    BMS bilateally 2003, PTCA bilaterally 2005 with restenosis  . Sleep apnea    Stop Bang score of 4  . Stroke ([place])   . Symptomatic varicose veins, right 10/16/2018    Past Surgical History:  Procedure Laterality Date  . APPENDECTOMY    . CATARACT EXTRACTION W/PHACO  03/29/2011   Procedure: CATARACT EXTRACTION PHACO AND INTRAOCULAR LENS PLACEMENT (IOC);  Surgeon: Tonny Branch, MD;  Location: [facility];  Service: Ophthalmology;  Laterality: Right;  CDE:12.82  . CATARACT EXTRACTION W/PHACO  04/16/2011   Procedure: CATARACT EXTRACTION PHACO AND INTRAOCULAR LENS PLACEMENT (IOC);  Surgeon: Tonny Branch, MD;  Location: [facility];  Service: Ophthalmology;  Laterality: Left;  CDE: 11.54  . CESAREAN SECTION     x2  . CHOLECYSTECTOMY  2018  . HEMORRHOID SURGERY    . Right breast lumpectomy    . ROTATOR CUFF REPAIR  2008  . UPPER EXTREMITY ANGIOGRAPHY N/A 11/27/2017   Procedure: UPPER EXTREMITY ANGIOGRAPHY;  Surgeon: Marty Heck, MD;  Location: [facility];  Service: Cardiovascular;  Laterality: N/A;     Current Outpatient Medications  Medication Sig Dispense Refill  . acetaminophen (TYLENOL) 325 MG tablet Take 650 mg by mouth every 6 (six) hours as needed for mild pain. For pain    . albuterol (VENTOLIN HFA) 108 (90 Base) MCG/ACT inhaler Inhale 2 puffs into the lungs every 6 (six) hours as needed for wheezing or shortness of breath. 18 g 0  .  amLODipine (NORVASC) 10 MG tablet Take 1 tablet by mouth once daily 90 tablet 0  . aspirin EC 81 MG tablet Take 81 mg by mouth daily.    . cetirizine (ZYRTEC) 10 MG tablet Take 1 tablet (10 mg total) by mouth daily. 90 tablet 1  . clopidogrel (PLAVIX) 75 MG tablet Take 1 tablet (75 mg total) by mouth daily. 90 tablet 1  . famotidine (PEPCID) 20 MG tablet Take 1 tablet (20 mg total) by mouth daily. 90  tablet 1  . fluticasone (FLONASE) 50 MCG/ACT nasal spray Place 2 sprays into the nose daily as needed for allergies (congestion.).     Marland Kitchen hydrALAZINE (APRESOLINE) 10 MG tablet Take 2 tablets (20 mg total) by mouth 3 (three) times daily. 120 tablet 6  . metoprolol tartrate (LOPRESSOR) 25 MG tablet Take 1 tablet (25 mg total) by mouth 2 (two) times daily. 180 tablet 1  . ondansetron (ZOFRAN) 4 MG tablet Take 1 tablet (4 mg total) by mouth every 8 (eight) hours as needed for nausea or vomiting. 20 tablet 0  . pantoprazole (PROTONIX) 40 MG tablet Take 1 tablet (40 mg total) by mouth daily before breakfast. 90 tablet 1  . pravastatin (PRAVACHOL) 80 MG tablet Take 1 tablet (80 mg total) by mouth daily. 90 tablet 1   No current facility-administered medications for this visit.    Allergies:   Losartan, Sulfa antibiotics, Codeine, Fish allergy, Other, Spironolactone, Vancomycin, and Penicillins    Social History:  The patient  reports that she quit smoking about 21 years ago. Her smoking use included cigarettes. She has a 35.00 pack-year smoking history. She has never used smokeless tobacco. She reports that she does not drink alcohol or use drugs.   Family History:  The patient's family history includes Brain cancer in her brother and sister; COPD in her sister; Diabetes in her brother, brother, father, sister, son, and son; Heart failure in her mother; Lung cancer in her father; Stroke (age of onset: 96) in her mother.    ROS:  Please see the history of present illness.   Otherwise, review of systems are positive for none.   All other systems are reviewed and negative.    PHYSICAL EXAM: VS:  BP (!) 150/68   Pulse 65   Temp 97.9 F (36.6 C)   Ht 5' 3.5" (1.613 m)   Wt 145 lb 6.4 oz (66 kg)   SpO2 95%   BMI 25.35 kg/m  , BMI Body mass index is 25.35 kg/m. GEN: Well nourished, well developed, in no acute distress  HEENT: normal  Neck: no JVD,  or masses.  Bilateral carotid bruits Cardiac:  RRR; no  rubs, or gallops.  2 out of 6 systolic murmur in the aortic area which is early peaking.  There is mild bilateral leg edema with significant tenderness. Respiratory:  clear to auscultation bilaterally, normal work of breathing GI: soft, nontender, nondistended, + BS MS: no deformity or atrophy  Skin: warm and dry, no rash Neuro:  Strength and sensation are intact Psych: euthymic mood, full affect Vascular: Femoral pulses normal bilaterally.  Distal pulses are palpable.   EKG:  EKG is not ordered today.    Recent Labs: 10/22/2018: ALT 13 01/14/2019: Magnesium 2.1 03/06/2019: BUN 18; Creatinine, Ser 1.34; Potassium 4.1; Sodium 143    Lipid Panel No results found for: CHOL, TRIG, HDL, CHOLHDL, VLDL, LDLCALC, LDLDIRECT    Wt Readings from Last 3 Encounters:  03/10/19 145 lb 6.4 oz (66  kg)  03/04/19 145 lb 9.6 oz (66 kg)  02/23/19 144 lb (65.3 kg)        No flowsheet data found.    ASSESSMENT AND PLAN:  1.  Renal artery stenosis: Recent duplex showed evidence of restenosis only affecting the left renal artery.  The right renal artery stent was patent with no significant restenosis.  I discussed with her the indications for renal artery revascularization including recurrent heart failure, worsening renal function or uncontrolled hypertension in spite of 3 antihypertensive medications preferably as one of them being a thiazide diuretic. She currently has none of these indications with the exception of uncontrolled hypertension.  However, her antihypertensive medications are not maximized.  This is somewhat complicated by poor tolerance to medications. I think as a first step, I elected to switch metoprolol to carvedilol 6.25 mg twice daily which is a better antihypertensive medication. I do think at some point we might need to consider even a small dose hydrochlorothiazide 12.5 mg once daily especially in the setting of leg edema which is likely worsened by high-dose  amlodipine. If we cannot control her blood pressure with 3 antihypertensive medications, then the next step is to proceed with renal artery angiography with planned revascularization.  2.  Carotid artery disease: Continue with yearly Doppler..  Continue aggressive treatment of risk factors.  She continues to be on dual antiplatelet therapy.  3.  Hyperlipidemia: Given her extensive vascular disease, consider a more potent statin and an LDL target below 70.    Disposition:   FU with me in 2 months  Signed,  Kathlyn [place], MD  03/10/2019 8:16 AM    [place]

## 2019-03-10 NOTE — Patient Instructions (Signed)
Medication Instructions:  STOP METOPROLOL   START CARVEDILOL 6.25 MG TWICE A DAY   *If you need a refill on your cardiac medications before your next appointment, please call your pharmacy*  Lab Work: NONE   Testing/Procedures: NONE  Follow-Up: At [facility], you and your health needs are our priority.  As part of our continuing mission to provide you with exceptional heart care, we have created designated Provider Care Teams.  These Care Teams include your primary Cardiologist (physician) and Advanced Practice Providers (APPs -  Physician Assistants and Nurse Practitioners) who all work together to provide you with the care you need, when you need it.  Your next appointment:   2 month(s)  The format for your next appointment:   In Person  Provider:   You may see DR Fletcher Anon or one of the following Advanced Practice Providers on your designated Care Team:

## 2019-03-16 ENCOUNTER — Other Ambulatory Visit: Payer: Self-pay

## 2019-03-16 ENCOUNTER — Other Ambulatory Visit: Payer: Medicare Other

## 2019-03-16 DIAGNOSIS — R600 Localized edema: Secondary | ICD-10-CM

## 2019-03-16 DIAGNOSIS — N1832 Chronic kidney disease, stage 3b: Secondary | ICD-10-CM

## 2019-03-17 LAB — BMP8+EGFR
BUN/Creatinine Ratio: 12 (ref 12–28)
BUN: 19 mg/dL (ref 8–27)
CO2: 21 mmol/L (ref 20–29)
Calcium: 9.5 mg/dL (ref 8.7–10.3)
Chloride: 106 mmol/L (ref 96–106)
Creatinine, Ser: 1.62 mg/dL — ABNORMAL HIGH (ref 0.57–1.00)
GFR calc Af Amer: 35 mL/min/{1.73_m2} — ABNORMAL LOW (ref >59)
GFR calc non Af Amer: 31 mL/min/{1.73_m2} — ABNORMAL LOW (ref >59)
Glucose: 94 mg/dL (ref 65–99)
Potassium: 4.6 mmol/L (ref 3.5–5.2)
Sodium: 143 mmol/L (ref 134–144)

## 2019-03-18 ENCOUNTER — Other Ambulatory Visit: Payer: Self-pay | Admitting: Family Medicine

## 2019-03-18 DIAGNOSIS — I701 Atherosclerosis of renal artery: Secondary | ICD-10-CM

## 2019-03-18 DIAGNOSIS — N1832 Chronic kidney disease, stage 3b: Secondary | ICD-10-CM

## 2019-03-30 ENCOUNTER — Other Ambulatory Visit: Payer: Self-pay

## 2019-03-30 ENCOUNTER — Other Ambulatory Visit: Payer: Medicare Other

## 2019-04-06 ENCOUNTER — Other Ambulatory Visit (HOSPITAL_COMMUNITY): Payer: Self-pay | Admitting: Nephrology

## 2019-04-06 ENCOUNTER — Other Ambulatory Visit: Payer: Self-pay | Admitting: Nephrology

## 2019-04-06 DIAGNOSIS — N1832 Chronic kidney disease, stage 3b: Secondary | ICD-10-CM

## 2019-04-08 ENCOUNTER — Other Ambulatory Visit: Payer: Self-pay

## 2019-04-08 ENCOUNTER — Other Ambulatory Visit: Payer: Medicare Other

## 2019-04-08 LAB — HM HEPATITIS C SCREENING LAB: HM Hepatitis Screen: NEGATIVE

## 2019-04-10 ENCOUNTER — Ambulatory Visit (INDEPENDENT_AMBULATORY_CARE_PROVIDER_SITE_OTHER): Payer: Medicare Other | Admitting: Cardiology

## 2019-04-10 ENCOUNTER — Telehealth: Payer: Self-pay | Admitting: *Deleted

## 2019-04-10 ENCOUNTER — Other Ambulatory Visit: Payer: Self-pay

## 2019-04-10 ENCOUNTER — Encounter: Payer: Self-pay | Admitting: Cardiology

## 2019-04-10 VITALS — BP 148/82 | HR 101 | Ht 63.5 in | Wt 135.0 lb

## 2019-04-10 DIAGNOSIS — E782 Mixed hyperlipidemia: Secondary | ICD-10-CM

## 2019-04-10 DIAGNOSIS — I10 Essential (primary) hypertension: Secondary | ICD-10-CM

## 2019-04-10 DIAGNOSIS — R079 Chest pain, unspecified: Secondary | ICD-10-CM | POA: Diagnosis not present

## 2019-04-10 DIAGNOSIS — I701 Atherosclerosis of renal artery: Secondary | ICD-10-CM

## 2019-04-10 MED ORDER — CARVEDILOL 12.5 MG PO TABS: 12.5000 mg | ORAL_TABLET | Freq: Two times a day (BID) | ORAL | 3 refills | Status: DC

## 2019-04-10 NOTE — Patient Instructions (Addendum)
Medication Instructions:    Your physician has recommended you make the following change in your medication:   Increase carvedilol to 12.5 mg by mouth twice daily  Continue other medications the same  Labwork:  Your physician recommends that you return for a FASTING lipid profile: as soon as possible. You may have this done at your family doctor's office.  Testing/Procedures:  NONE  Follow-Up:  Your physician recommends that you schedule a follow-up appointment in: 6 months (office). You will receive a reminder letter in the mail in about 4 months reminding you to call and schedule your appointment. If you don't receive this letter, please contact our office.  Any Other Special Instructions Will Be Listed Below (If Applicable).  If you need a refill on your cardiac medications before your next appointment, please call your pharmacy.

## 2019-04-10 NOTE — Telephone Encounter (Signed)
Having colonoscopy on 04/28/2019. Wants to know if she can hold plavix for 5 days before procedure. Procedure is being done at [facility] [address] [place], [place] [address] [phone]

## 2019-04-10 NOTE — Progress Notes (Signed)
Cardiology Office Note  Date: 04/10/2019   ID: Tamara King, Tamara King Feb 05, 1940, MRN [id number]  PCP:  Loman Brooklyn, FNP  Cardiologist:  Rozann Lesches, MD Electrophysiologist:  None   Chief Complaint  Patient presents with  . Cardiac follow-up    History of Present Illness: Tamara King is a 79 y.o. female last seen in February.  She presents for a follow-up visit.  Since last assessment she has had evaluation by nephrology (Dr. Theador Hawthorne) and also our vascular team (Dr. Fletcher Anon).  She did have follow-up renal artery Dopplers in February that showed 1 to 59% stenosis on the right and greater than 60% stenosis on the left.  Medical therapy adjustments are being made at this time in terms of blood pressure control.  She was most recently switched from [place] to [place].  I personally reviewed her ECG today which shows sinus rhythm with IVCD of left bundle branch block type and PVCs.  I do not see that she has had follow-up lipids with her PCP recently, we discussed getting this set up in order to make sure that her LDL is adequately controlled on Pravachol.   Past Medical History:  Diagnosis Date  . Breast cancer ([place])    Adenocarcinoma  . Chronic edema   . CKD (chronic kidney disease) stage 3, GFR 30-59 ml/min 10/22/2018  . Coronary atherosclerosis of native coronary artery    Nonobstructive at catheterization 2003, LVEF normal   . Essential hypertension   . GERD (gastroesophageal reflux disease) 07/06/2013  . Idiopathic peripheral neuropathy 10/06/2013  . Mixed hyperlipidemia   . Occlusion and stenosis of left carotid artery 10/22/2017  . Renal artery stenosis (HCC)    BMS bilateally 2003, PTCA bilaterally 2005 with restenosis  . Sleep apnea    Stop Bang score of 4  . Stroke ([place])   . Symptomatic varicose veins, right 10/16/2018    Past Surgical History:  Procedure Laterality Date  . APPENDECTOMY    . CATARACT EXTRACTION W/PHACO  03/29/2011   Procedure: CATARACT  EXTRACTION PHACO AND INTRAOCULAR LENS PLACEMENT (IOC);  Surgeon: Tonny Branch, MD;  Location: [facility];  Service: Ophthalmology;  Laterality: Right;  CDE:12.82  . CATARACT EXTRACTION W/PHACO  04/16/2011   Procedure: CATARACT EXTRACTION PHACO AND INTRAOCULAR LENS PLACEMENT (IOC);  Surgeon: Tonny Branch, MD;  Location: [facility];  Service: Ophthalmology;  Laterality: Left;  CDE: 11.54  . CESAREAN SECTION     x2  . CHOLECYSTECTOMY  2018  . HEMORRHOID SURGERY    . Right breast lumpectomy    . ROTATOR CUFF REPAIR  2008  . UPPER EXTREMITY ANGIOGRAPHY N/A 11/27/2017   Procedure: UPPER EXTREMITY ANGIOGRAPHY;  Surgeon: Marty Heck, MD;  Location: [facility];  Service: Cardiovascular;  Laterality: N/A;    Current Outpatient Medications  Medication Sig Dispense Refill  . acetaminophen (TYLENOL) 325 MG tablet Take 650 mg by mouth every 6 (six) hours as needed for mild pain. For pain    . albuterol (VENTOLIN HFA) 108 (90 Base) MCG/ACT inhaler Inhale 2 puffs into the lungs every 6 (six) hours as needed for wheezing or shortness of breath. 18 g 0  . amLODipine (NORVASC) 10 MG tablet Take 1 tablet by mouth once daily 90 tablet 0  . aspirin EC 81 MG tablet Take 81 mg by mouth daily.    . cetirizine (ZYRTEC) 10 MG tablet Take 1 tablet (10 mg total) by mouth daily. 90 tablet 1  . clopidogrel (PLAVIX) 75  MG tablet Take 1 tablet (75 mg total) by mouth daily. 90 tablet 1  . famotidine (PEPCID) 20 MG tablet Take 1 tablet (20 mg total) by mouth daily. 90 tablet 1  . fluticasone (FLONASE) 50 MCG/ACT nasal spray Place 2 sprays into the nose daily as needed for allergies (congestion.).     Marland Kitchen furosemide (LASIX) 40 MG tablet Take 40 mg by mouth daily.    . hydrALAZINE (APRESOLINE) 10 MG tablet Take 2 tablets (20 mg total) by mouth 3 (three) times daily. 120 tablet 6  . metoprolol tartrate (LOPRESSOR) 25 MG tablet Take 1 tablet (25 mg total) by mouth 2 (two) times daily. 180 tablet 1  . ondansetron (ZOFRAN) 4 MG  tablet Take 1 tablet (4 mg total) by mouth every 8 (eight) hours as needed for nausea or vomiting. 20 tablet 0  . pantoprazole (PROTONIX) 40 MG tablet Take 1 tablet (40 mg total) by mouth daily before breakfast. 90 tablet 1  . pravastatin (PRAVACHOL) 80 MG tablet Take 1 tablet (80 mg total) by mouth daily. 90 tablet 1  . carvedilol (COREG) 12.5 MG tablet Take 1 tablet (12.5 mg total) by mouth 2 (two) times daily. 180 tablet 3   No current facility-administered medications for this visit.   Allergies:  Losartan, Sulfa antibiotics, Codeine, Fish allergy, Other, Spironolactone, Vancomycin, and Penicillins   ROS:   No chest pain, palpitations or syncope.  Physical Exam: VS:  BP (!) 148/82   Pulse (!) 101   Ht 5' 3.5" (1.613 m)   Wt 135 lb (61.2 kg)   BMI 23.54 kg/m , BMI Body mass index is 23.54 kg/m.  Wt Readings from Last 3 Encounters:  04/10/19 135 lb (61.2 kg)  03/10/19 145 lb 6.4 oz (66 kg)  03/04/19 145 lb 9.6 oz (66 kg)    General: Elderly woman, appears comfortable at rest. HEENT: Conjunctiva and lids normal, wearing a mask. Neck: Supple, no elevated JVP or carotid bruits, no thyromegaly. Lungs: Clear to auscultation, nonlabored breathing at rest. Cardiac: Regular rate and rhythm, no S3, soft systolic murmur. Abdomen: Soft, nontender, bowel sounds present. Extremities: Improving leg edema, distal pulses 2+.  ECG:  An ECG dated 02/23/2019 was personally reviewed today and demonstrated:  Sinus rhythm with left bundle branch block.  Recent Labwork: 10/22/2018: ALT 13; AST 22 01/14/2019: Magnesium 2.1 03/16/2019: BUN 19; Creatinine, Ser 1.62; Potassium 4.6; Sodium 143   Other Studies Reviewed Today:  Carotid Dopplers 01/02/2019: Summary:  Right Carotid: Velocities in the right ICA are consistent with a 1-39%  stenosis.         Stenosis a the origin of the innominate artery.   Left Carotid: Evidence consistent with a total occlusion of the left ICA.  The         ECA appears >50% stenosed.   Vertebrals: Bilateral vertebral arteries demonstrate antegrade flow.  Subclavians: Right subclavian artery was stenotic. Normal flow  hemodynamics were        seen in the left subclavian artery.   Echocardiogram 06/17/2016: Study Conclusions   - Left ventricle: The cavity size was normal. There was mild focal  basal hypertrophy of the septum. Systolic function was vigorous.  The estimated ejection fraction was in the range of 65% to 70%.  Wall motion was normal; there were no regional wall motion  abnormalities. Doppler parameters are consistent with abnormal  left ventricular relaxation (grade 1 diastolic dysfunction).  There was no evidence of elevated ventricular filling pressure by  Doppler parameters.  -  Aortic valve: There was no regurgitation.  - Aortic root: The aortic root was normal in size.  - Mitral valve: There was mild regurgitation.  - Left atrium: The atrium was normal in size.  - Right ventricle: Systolic function was normal.  - Tricuspid valve: There was trivial regurgitation.  - Pulmonic valve: There was trivial regurgitation.  - Pulmonary arteries: Systolic pressure was mildly increased. PA  peak pressure: 36 mm Hg (S).  - Inferior vena cava: The vessel was normal in size.  - Pericardium, extracardiac: There was no pericardial effusion.   Renal artery Dopplers 02/25/2019: Right: Normal size right kidney. Abnormal right Resistive Index.     Normal cortical thickness of right kidney. 1-59% stenosis of     the right renal artery. RRV flow present. The right renal     artery stent appears to be patent.  Left: Evidence of a > 60% stenosis in the left renal artery. Normal     size of left kidney. Abnormal left Resisitve Index. Normal     cortical thickness of the left kidney. LRV flow present. The     left renal artery stent appears to be patent.  Mesenteric:  Normal Celiac artery  and Superior Mesenteric artery findings.  Small caliber aorta with aorto-iliac atherosclerosis.   Assessment and Plan:  1.  Essential hypertension.  Plan is to continue Norvasc, increase Coreg to 12.5 mg twice daily, and continue hydralazine.  2.  Renal artery stenosis with history of previous bilateral stent interventions.  Recent Dopplers are noted above, she has now reestablished with Dr. Fletcher Anon.  3.  Mixed hyperlipidemia, on high-dose Pravachol.  She has not had a recent lipid panel per PCP and this will be arranged.  Ideally LDL should be under 70.  May need a more potent statin.  4.  Carotid artery disease with known occlusion of the LICA and mild disease on the right.  Continue aspirin and statin.  Medication Adjustments/Labs and Tests Ordered: Current medicines are reviewed at length with the patient today.  Concerns regarding medicines are outlined above.   Tests Ordered: Orders Placed This Encounter  Procedures  . Lipid panel  . EKG 12-Lead    Medication Changes: Meds ordered this encounter  Medications  . carvedilol (COREG) 12.5 MG tablet    Sig: Take 1 tablet (12.5 mg total) by mouth 2 (two) times daily.    Dispense:  180 tablet    Refill:  3    Disposition:  Follow up 6 months in the [place] office.  Signed, Satira Sark, MD, [facility] 04/10/2019 12:33 PM    [facility] [place], [place], [place] [address] Phone: [phone]; Fax: [phone]

## 2019-04-10 NOTE — Telephone Encounter (Signed)
Plavix may be held as requested prior to endoscopy.

## 2019-04-10 NOTE — Telephone Encounter (Signed)
Patient informed and verbalized understanding

## 2019-04-13 ENCOUNTER — Other Ambulatory Visit: Payer: Self-pay | Admitting: *Deleted

## 2019-04-13 ENCOUNTER — Other Ambulatory Visit: Payer: Self-pay

## 2019-04-13 ENCOUNTER — Other Ambulatory Visit: Payer: Medicare Other

## 2019-04-13 DIAGNOSIS — E782 Mixed hyperlipidemia: Secondary | ICD-10-CM

## 2019-04-14 LAB — LIPID PANEL
Chol/HDL Ratio: 2.1 ratio (ref 0.0–4.4)
Cholesterol, Total: 177 mg/dL (ref 100–199)
HDL: 85 mg/dL (ref >39)
LDL Chol Calc (NIH): 75 mg/dL (ref 0–99)
Triglycerides: 99 mg/dL (ref 0–149)
VLDL Cholesterol Cal: 17 mg/dL (ref 5–40)

## 2019-04-15 ENCOUNTER — Ambulatory Visit (HOSPITAL_COMMUNITY): Payer: Medicare Other

## 2019-04-15 ENCOUNTER — Telehealth: Payer: Self-pay | Admitting: *Deleted

## 2019-04-15 NOTE — Telephone Encounter (Signed)
-----   Message from Satira Sark, MD sent at 04/14/2019  7:56 AM EDT ----- Results reviewed.  LDL is at 75 on high-dose Pravachol.  No changes for now.  Her HDL is also quite good at 85.

## 2019-04-15 NOTE — Telephone Encounter (Signed)
Patient informed. Copy sent to PCP °

## 2019-04-16 ENCOUNTER — Other Ambulatory Visit: Payer: Self-pay

## 2019-04-16 ENCOUNTER — Ambulatory Visit (HOSPITAL_COMMUNITY)
Admission: RE | Admit: 2019-04-16 | Discharge: 2019-04-16 | Disposition: A | Discharge status: Home / Self Care | Payer: Medicare Other | Source: Ambulatory Visit | Attending: Nephrology | Admitting: Nephrology

## 2019-04-16 ENCOUNTER — Other Ambulatory Visit: Payer: Self-pay | Admitting: Family Medicine

## 2019-04-16 DIAGNOSIS — K219 Gastro-esophageal reflux disease without esophagitis: Secondary | ICD-10-CM

## 2019-04-16 DIAGNOSIS — N1832 Chronic kidney disease, stage 3b: Secondary | ICD-10-CM | POA: Diagnosis not present

## 2019-04-16 DIAGNOSIS — J302 Other seasonal allergic rhinitis: Secondary | ICD-10-CM

## 2019-04-16 NOTE — Progress Notes (Signed)
Assessment & Plan:  1. Essential (primary) hypertension - Elevated today. Continue to keep appointments with cardiology and nephrology.   2. Lower extremity edema - Improving. Encouraged compression hose daily.  - furosemide (LASIX) 40 MG tablet; Take 1 tablet (40 mg total) by mouth daily.  Dispense: 90 tablet; Refill: 1  3. Occlusion and stenosis of left carotid artery - clopidogrel (PLAVIX) 75 MG tablet; Take 1 tablet (75 mg total) by mouth daily.  Dispense: 90 tablet; Refill: 1  4. History of CVA (cerebrovascular accident) - clopidogrel (PLAVIX) 75 MG tablet; Take 1 tablet (75 mg total) by mouth daily.  Dispense: 90 tablet; Refill: 1  5. Hyperlipidemia, unspecified hyperlipidemia type - pravastatin (PRAVACHOL) 80 MG tablet; Take 1 tablet (80 mg total) by mouth daily.  Dispense: 90 tablet; Refill: 1   Return in about 3 months (around 07/18/2019) for follow-up of chronic medication conditions.  Hendricks Limes, MSN, APRN, FNP-C [facility]  Subjective:    Patient ID: Tamara King, female    DOB: 07-10-1940, 79 y.o.   MRN: [id number]  Patient Care Team: Loman Brooklyn, FNP as PCP - General (Family Medicine) Satira Sark, MD as PCP - Cardiology (Cardiology)   Chief Complaint:  Chief Complaint  Patient presents with  . Hypertension    3 month follow up    HPI: Tamara King is a 79 y.o. female presenting on 04/17/2019 for Hypertension (3 month follow up)  Patient has been seeing cardiology and nephrology due to her hypertension and renal artery stenosis. She will continue to follow with them. She does report the nephrologist put her back on furosemide and her swelling has improved she was started back on this once daily.    New complaints: None  Social history:  Relevant past medical, surgical, family and social history reviewed and updated as indicated. Interim medical history since our last visit reviewed.  Allergies and medications  reviewed and updated.  DATA REVIEWED: CHART IN EPIC  ROS: Negative unless specifically indicated above in HPI.    Current Outpatient Medications:  .  acetaminophen (TYLENOL) 325 MG tablet, Take 650 mg by mouth every 6 (six) hours as needed for mild pain. For pain, Disp: , Rfl:  .  albuterol (VENTOLIN HFA) 108 (90 Base) MCG/ACT inhaler, Inhale 2 puffs into the lungs every 6 (six) hours as needed for wheezing or shortness of breath., Disp: 18 g, Rfl: 0 .  amLODipine (NORVASC) 10 MG tablet, Take 1 tablet by mouth once daily, Disp: 90 tablet, Rfl: 0 .  aspirin EC 81 MG tablet, Take 81 mg by mouth daily., Disp: , Rfl:  .  carvedilol (COREG) 12.5 MG tablet, Take 1 tablet (12.5 mg total) by mouth 2 (two) times daily., Disp: 180 tablet, Rfl: 3 .  clopidogrel (PLAVIX) 75 MG tablet, Take 1 tablet (75 mg total) by mouth daily., Disp: 90 tablet, Rfl: 1 .  EQ ALLERGY RELIEF, CETIRIZINE, 10 MG tablet, Take 1 tablet by mouth once daily, Disp: 90 tablet, Rfl: 0 .  fluticasone (FLONASE) 50 MCG/ACT nasal spray, Place 2 sprays into the nose daily as needed for allergies (congestion.). , Disp: , Rfl:  .  furosemide (LASIX) 40 MG tablet, Take 1 tablet (40 mg total) by mouth daily., Disp: 90 tablet, Rfl: 1 .  hydrALAZINE (APRESOLINE) 10 MG tablet, Take 2 tablets (20 mg total) by mouth 3 (three) times daily., Disp: 120 tablet, Rfl: 6 .  ondansetron (ZOFRAN) 4 MG tablet, Take 1 tablet (  4 mg total) by mouth every 8 (eight) hours as needed for nausea or vomiting., Disp: 20 tablet, Rfl: 0 .  pantoprazole (PROTONIX) 40 MG tablet, TAKE 1 TABLET BY MOUTH ONCE DAILY BEFORE BREAKFAST, Disp: 90 tablet, Rfl: 0 .  pravastatin (PRAVACHOL) 80 MG tablet, Take 1 tablet (80 mg total) by mouth daily., Disp: 90 tablet, Rfl: 1 .  famotidine (PEPCID) 20 MG tablet, Take 1 tablet by mouth once daily, Disp: 90 tablet, Rfl: 0   Allergies  Allergen Reactions  . Losartan Anaphylaxis  . Sulfa Antibiotics Anaphylaxis and Swelling  . Codeine  Nausea And Vomiting  . Fish Allergy Other (See Comments)    "sick as a dog"  . Other Other (See Comments)    Bologna - vomiting  . Spironolactone Other (See Comments)    Acute kidney injury  . Vancomycin Other (See Comments)    "drives me crazy"  . Penicillins Rash    Has patient had a PCN reaction causing immediate rash, facial/tongue/throat swelling, SOB or lightheadedness with hypotension: Unknown Has patient had a PCN reaction causing severe rash involving mucus membranes or skin necrosis: Unknown Has patient had a PCN reaction that required hospitalization: No Has patient had a PCN reaction occurring within the last 10 years: No Childhood reaction. If all of the above answers are "NO", then may proceed with Cephalosporin use.    Past Medical History:  Diagnosis Date  . Breast cancer ([place])    Adenocarcinoma  . Chronic edema   . CKD (chronic kidney disease) stage 3, GFR 30-59 ml/min 10/22/2018  . Coronary atherosclerosis of native coronary artery    Nonobstructive at catheterization 2003, LVEF normal   . Essential hypertension   . GERD (gastroesophageal reflux disease) 07/06/2013  . Idiopathic peripheral neuropathy 10/06/2013  . Mixed hyperlipidemia   . Occlusion and stenosis of left carotid artery 10/22/2017  . Renal artery stenosis ([facility])    BMS bilateally 2003, PTCA bilaterally 2005 with restenosis  . Sleep apnea    Stop Bang score of 4  . Stroke ([place])   . Symptomatic varicose veins, right 10/16/2018    Past Surgical History:  Procedure Laterality Date  . APPENDECTOMY    . CATARACT EXTRACTION W/PHACO  03/29/2011   Procedure: CATARACT EXTRACTION PHACO AND INTRAOCULAR LENS PLACEMENT (IOC);  Surgeon: Tonny Branch, MD;  Location: [facility];  Service: Ophthalmology;  Laterality: Right;  CDE:12.82  . CATARACT EXTRACTION W/PHACO  04/16/2011   Procedure: CATARACT EXTRACTION PHACO AND INTRAOCULAR LENS PLACEMENT (IOC);  Surgeon: Tonny Branch, MD;  Location: [facility];  Service: Ophthalmology;   Laterality: Left;  CDE: 11.54  . CESAREAN SECTION     x2  . CHOLECYSTECTOMY  2018  . HEMORRHOID SURGERY    . Right breast lumpectomy    . ROTATOR CUFF REPAIR  2008  . UPPER EXTREMITY ANGIOGRAPHY N/A 11/27/2017   Procedure: UPPER EXTREMITY ANGIOGRAPHY;  Surgeon: Marty Heck, MD;  Location: [facility];  Service: Cardiovascular;  Laterality: N/A;    Social History   Socioeconomic History  . Marital status: Widowed    Spouse name: Not on file  . Number of children: 2  . Years of education: 12th  . Highest education level: Not on file  Occupational History    Employer: RETIRED    Comment: n/a  Tobacco Use  . Smoking status: Former Smoker    Packs/day: 1.00    Years: 35.00    Pack years: 35.00    Types: Cigarettes  Quit date: 01/22/1998    Years since quitting: 21.2  . Smokeless tobacco: Never Used  . Tobacco comment: tobacco use - no  Substance and Sexual Activity  . Alcohol use: No  . Drug use: No  . Sexual activity: Not on file  Other Topics Concern  . Not on file  Social History Narrative   Patient lives at home with family.  Two sons and two granddaughters live with her.   Caffeine use; 2-5 cups daily   Social Determinants of Health   Financial Resource Strain:   . Difficulty of Paying Living Expenses:   Food Insecurity:   . Worried About Charity fundraiser in the Last Year:   . Arboriculturist in the Last Year:   Transportation Needs:   . Film/video editor (Medical):   Marland Kitchen Lack of Transportation (Non-Medical):   Physical Activity:   . Days of Exercise per Week:   . Minutes of Exercise per Session:   Stress:   . Feeling of Stress :   Social Connections:   . Frequency of Communication with Friends and Family:   . Frequency of Social Gatherings with Friends and Family:   . Attends Religious Services:   . Active Member of Clubs or Organizations:   . Attends Archivist Meetings:   Marland Kitchen Marital Status:   Intimate Partner Violence:     . Fear of Current or Ex-Partner:   . Emotionally Abused:   Marland Kitchen Physically Abused:   . Sexually Abused:         Objective:    BP (!) 168/90   Pulse 68   Temp (!) 96.2 F (35.7 C) (Temporal)   Ht 5' 3.5" (1.613 m)   Wt 140 lb 9.6 oz (63.8 kg)   SpO2 97%   BMI 24.52 kg/m   Wt Readings from Last 3 Encounters:  04/17/19 140 lb 9.6 oz (63.8 kg)  04/10/19 135 lb (61.2 kg)  03/10/19 145 lb 6.4 oz (66 kg)    Physical Exam Vitals reviewed.  Constitutional:      General: She is not in acute distress.    Appearance: Normal appearance. She is not ill-appearing, toxic-appearing or diaphoretic.  HENT:     Head: Normocephalic and atraumatic.  Eyes:     General: No scleral icterus.       Right eye: No discharge.        Left eye: No discharge.     Conjunctiva/sclera: Conjunctivae normal.  Cardiovascular:     Rate and Rhythm: Normal rate and regular rhythm.     Heart sounds: Normal heart sounds. No murmur. No friction rub. No gallop.   Pulmonary:     Effort: Pulmonary effort is normal. No respiratory distress.     Breath sounds: Normal breath sounds. No stridor. No wheezing, rhonchi or rales.  Musculoskeletal:        General: Normal range of motion.     Cervical back: Normal range of motion.     Right lower leg: 2+ Edema present.     Left lower leg: 2+ Edema present.  Skin:    General: Skin is warm and dry.     Capillary Refill: Capillary refill takes less than 2 seconds.  Neurological:     General: No focal deficit present.     Mental Status: She is alert and oriented to person, place, and time. Mental status is at baseline.  Psychiatric:        Mood and Affect: Mood  normal.        Behavior: Behavior normal.        Thought Content: Thought content normal.        Judgment: Judgment normal.     No results found for: TSH Lab Results  Component Value Date   WBC 7.0 06/18/2016   HGB 14.6 11/27/2017   HCT 43.0 11/27/2017   MCV 92.1 06/18/2016   PLT 198 06/18/2016   Lab  Results  Component Value Date   NA 143 03/16/2019   K 4.6 03/16/2019   CO2 21 03/16/2019   GLUCOSE 94 03/16/2019   BUN 19 03/16/2019   CREATININE 1.62 (H) 03/16/2019   BILITOT 0.6 10/22/2018   ALKPHOS 85 10/22/2018   AST 22 10/22/2018   ALT 13 10/22/2018   PROT 6.7 10/22/2018   ALBUMIN 4.5 10/22/2018   CALCIUM 9.5 03/16/2019   ANIONGAP 6 06/18/2016   Lab Results  Component Value Date   CHOL 177 04/13/2019   Lab Results  Component Value Date   HDL 85 04/13/2019   Lab Results  Component Value Date   LDLCALC 75 04/13/2019   Lab Results  Component Value Date   TRIG 99 04/13/2019   Lab Results  Component Value Date   CHOLHDL 2.1 04/13/2019   No results found for: HGBA1C

## 2019-04-17 ENCOUNTER — Encounter: Payer: Self-pay | Admitting: Family Medicine

## 2019-04-17 ENCOUNTER — Ambulatory Visit (INDEPENDENT_AMBULATORY_CARE_PROVIDER_SITE_OTHER): Payer: Medicare Other | Admitting: Family Medicine

## 2019-04-17 VITALS — BP 168/90 | HR 68 | Temp 96.2°F | Ht 63.5 in | Wt 140.6 lb

## 2019-04-17 DIAGNOSIS — Z8673 Personal history of transient ischemic attack (TIA), and cerebral infarction without residual deficits: Secondary | ICD-10-CM | POA: Diagnosis not present

## 2019-04-17 DIAGNOSIS — E785 Hyperlipidemia, unspecified: Secondary | ICD-10-CM

## 2019-04-17 DIAGNOSIS — R600 Localized edema: Secondary | ICD-10-CM | POA: Diagnosis not present

## 2019-04-17 DIAGNOSIS — I6522 Occlusion and stenosis of left carotid artery: Secondary | ICD-10-CM | POA: Diagnosis not present

## 2019-04-17 DIAGNOSIS — I10 Essential (primary) hypertension: Secondary | ICD-10-CM | POA: Diagnosis not present

## 2019-04-17 MED ORDER — PRAVASTATIN SODIUM 80 MG PO TABS: 80.0000 mg | ORAL_TABLET | Freq: Every day | ORAL | 1 refills | Status: DC

## 2019-04-17 MED ORDER — FUROSEMIDE 40 MG PO TABS: 40.0000 mg | ORAL_TABLET | Freq: Every day | ORAL | 1 refills | Status: DC

## 2019-04-17 MED ORDER — CLOPIDOGREL BISULFATE 75 MG PO TABS: 75.0000 mg | ORAL_TABLET | Freq: Every day | ORAL | 1 refills | Status: DC

## 2019-04-17 NOTE — Patient Instructions (Signed)
[place] for the COVID-19 vaccine.  Phone: [phone]

## 2019-04-20 ENCOUNTER — Other Ambulatory Visit: Payer: Self-pay | Admitting: Family Medicine

## 2019-04-20 DIAGNOSIS — K219 Gastro-esophageal reflux disease without esophagitis: Secondary | ICD-10-CM

## 2019-04-25 ENCOUNTER — Other Ambulatory Visit: Payer: Self-pay | Admitting: Family Medicine

## 2019-04-25 DIAGNOSIS — I10 Essential (primary) hypertension: Secondary | ICD-10-CM

## 2019-04-26 ENCOUNTER — Encounter: Payer: Self-pay | Admitting: Family Medicine

## 2019-04-29 ENCOUNTER — Encounter: Payer: Self-pay | Admitting: *Deleted

## 2019-05-12 ENCOUNTER — Other Ambulatory Visit: Payer: Self-pay

## 2019-05-12 ENCOUNTER — Ambulatory Visit (INDEPENDENT_AMBULATORY_CARE_PROVIDER_SITE_OTHER): Payer: Medicare Other | Admitting: Cardiovascular Disease

## 2019-05-12 ENCOUNTER — Encounter: Payer: Self-pay | Admitting: Cardiovascular Disease

## 2019-05-12 VITALS — BP 156/76 | HR 73 | Ht 63.5 in | Wt 140.6 lb

## 2019-05-12 DIAGNOSIS — Z01812 Encounter for preprocedural laboratory examination: Secondary | ICD-10-CM

## 2019-05-12 DIAGNOSIS — I701 Atherosclerosis of renal artery: Secondary | ICD-10-CM

## 2019-05-12 NOTE — Progress Notes (Signed)
Cardiology Office Note   Date:  05/14/2019   ID:  Valentine, Antony Jan 17, 1941, MRN [id number]  PCP:  Loman Brooklyn, FNP  Cardiologist: Dr. Domenic Polite  No chief complaint on file.     History of Present Illness: Tamara King is a 79 y.o. female who is here today for follow-up visit regarding renal artery stenosis.   She has history of mild nonobstructive coronary artery disease on previous cardiac catheterization in 2003.  She has known history of renal artery stenosis.  She underwent bilateral renal artery stenting in December 2003.  She had restenosis in 2005 and had repeat angiography by Dr. Albertine Patricia.  She underwent bilateral angioplasty for in-stent restenosis.  No reintervention since then.  She has chronic medical conditions that include difficult to control hypertension, hyperlipidemia, carotid disease with known occluded left carotid artery, mild chronic kidney disease, previous tobacco use and left bundle branch block. She has intolerance to multiple antihypertensive medications according to the notes.  She is not able to tolerate ACE inhibitors or ARB due to an allergic reaction.  Hydrochlorothiazide caused constipation.  Spironolactone caused worsening renal function.   She had recent renal artery duplex which showed significant restenosis only on the left side.  During last visit, I switch metoprolol to carvedilol which was subsequently uptitrated by Dr. Domenic Polite.  Her blood pressure continues to be elevated.  In addition, there was some worsening of renal function with recent creatinine of 1.6.  No chest pain or shortness of breath.   Past Medical History:  Diagnosis Date  . Breast cancer ([place])    Adenocarcinoma  . Chronic edema   . CKD (chronic kidney disease) stage 3, GFR 30-59 ml/min 10/22/2018  . Coronary atherosclerosis of native coronary artery    Nonobstructive at catheterization 2003, LVEF normal   . Essential hypertension   . GERD (gastroesophageal reflux  disease) 07/06/2013  . Idiopathic peripheral neuropathy 10/06/2013  . Mixed hyperlipidemia   . Occlusion and stenosis of left carotid artery 10/22/2017  . Renal artery stenosis ([facility])    BMS bilateally 2003, PTCA bilaterally 2005 with restenosis  . Sleep apnea    Stop Bang score of 4  . Stroke ([place])   . Symptomatic varicose veins, right 10/16/2018    Past Surgical History:  Procedure Laterality Date  . APPENDECTOMY    . CATARACT EXTRACTION W/PHACO  03/29/2011   Procedure: CATARACT EXTRACTION PHACO AND INTRAOCULAR LENS PLACEMENT (IOC);  Surgeon: Tonny Branch, MD;  Location: [facility];  Service: Ophthalmology;  Laterality: Right;  CDE:12.82  . CATARACT EXTRACTION W/PHACO  04/16/2011   Procedure: CATARACT EXTRACTION PHACO AND INTRAOCULAR LENS PLACEMENT (IOC);  Surgeon: Tonny Branch, MD;  Location: [facility];  Service: Ophthalmology;  Laterality: Left;  CDE: 11.54  . CESAREAN SECTION     x2  . CHOLECYSTECTOMY  2018  . HEMORRHOID SURGERY    . Right breast lumpectomy    . ROTATOR CUFF REPAIR  2008  . UPPER EXTREMITY ANGIOGRAPHY N/A 11/27/2017   Procedure: UPPER EXTREMITY ANGIOGRAPHY;  Surgeon: Marty Heck, MD;  Location: [facility];  Service: Cardiovascular;  Laterality: N/A;     Current Outpatient Medications  Medication Sig Dispense Refill  . albuterol (VENTOLIN HFA) 108 (90 Base) MCG/ACT inhaler Inhale 2 puffs into the lungs every 6 (six) hours as needed for wheezing or shortness of breath. 18 g 0  . amLODipine (NORVASC) 10 MG tablet Take 1 tablet by mouth once daily (Patient taking differently: Take  10 mg by mouth daily. ) 90 tablet 0  . aspirin EC 81 MG tablet Take 81 mg by mouth daily.    . carvedilol (COREG) 12.5 MG tablet Take 1 tablet (12.5 mg total) by mouth 2 (two) times daily. 180 tablet 3  . cholecalciferol (VITAMIN D3) 25 MCG (1000 UNIT) tablet Take 1,000 Units by mouth daily.    . clopidogrel (PLAVIX) 75 MG tablet Take 1 tablet (75 mg total) by mouth daily. 90 tablet 1  .  EQ ALLERGY RELIEF, CETIRIZINE, 10 MG tablet Take 1 tablet by mouth once daily (Patient taking differently: Take 10 mg by mouth daily. ) 90 tablet 0  . famotidine (PEPCID) 20 MG tablet Take 1 tablet by mouth once daily (Patient taking differently: Take 20 mg by mouth daily. ) 90 tablet 0  . fluticasone (FLONASE) 50 MCG/ACT nasal spray Place 2 sprays into the nose daily as needed for allergies (congestion.).     Marland Kitchen furosemide (LASIX) 40 MG tablet Take 1 tablet (40 mg total) by mouth daily. 90 tablet 1  . hydrALAZINE (APRESOLINE) 10 MG tablet Take 2 tablets (20 mg total) by mouth 3 (three) times daily. 120 tablet 6  . ondansetron (ZOFRAN) 4 MG tablet Take 1 tablet (4 mg total) by mouth every 8 (eight) hours as needed for nausea or vomiting. 20 tablet 0  . pantoprazole (PROTONIX) 40 MG tablet TAKE 1 TABLET BY MOUTH ONCE DAILY BEFORE BREAKFAST (Patient taking differently: Take 40 mg by mouth daily. ) 90 tablet 0  . pravastatin (PRAVACHOL) 80 MG tablet Take 1 tablet (80 mg total) by mouth daily. 90 tablet 1  . acetaminophen (TYLENOL) 500 MG tablet Take 500 mg by mouth every 6 (six) hours as needed for moderate pain.    . Tetrahydrozoline HCl (VISINE OP) Place 2 drops into both eyes daily as needed (dry eyes).     No current facility-administered medications for this visit.    Allergies:   Losartan, Sulfa antibiotics, Codeine, Fish allergy, Other, Spironolactone, Vancomycin, and Penicillins    Social History:  The patient  reports that she quit smoking about 21 years ago. Her smoking use included cigarettes. She has a 35.00 pack-year smoking history. She has never used smokeless tobacco. She reports that she does not drink alcohol or use drugs.   Family History:  The patient's family history includes Brain cancer in her brother and sister; COPD in her sister; Diabetes in her brother, brother, father, sister, son, and son; Heart failure in her mother; Lung cancer in her father; Stroke (age of onset: 19) in  her mother.    ROS:  Please see the history of present illness.   Otherwise, review of systems are positive for none.   All other systems are reviewed and negative.    PHYSICAL EXAM: VS:  BP (!) 156/76   Pulse 73   Ht 5' 3.5" (1.613 m)   Wt 140 lb 9.6 oz (63.8 kg)   SpO2 96%   BMI 24.52 kg/m  , BMI Body mass index is 24.52 kg/m. GEN: Well nourished, well developed, in no acute distress  HEENT: normal  Neck: no JVD,  or masses.  Bilateral carotid bruits Cardiac: RRR; no  rubs, or gallops.  2 out of 6 systolic murmur in the aortic area which is early peaking.  There is mild bilateral leg edema with significant tenderness. Respiratory:  clear to auscultation bilaterally, normal work of breathing GI: soft, nontender, nondistended, + BS MS: no deformity or atrophy  Skin: warm and dry, no rash Neuro:  Strength and sensation are intact Psych: euthymic mood, full affect Vascular: Femoral pulses normal bilaterally.  Distal pulses are palpable.   EKG:  EKG is not ordered today.    Recent Labs: 10/22/2018: ALT 13 01/14/2019: Magnesium 2.1 05/12/2019: BUN 18; Creatinine, Ser 1.13; Hemoglobin 14.0; Platelets 244; Potassium 3.9; Sodium 138    Lipid Panel    Component Value Date/Time   CHOL 177 04/13/2019 0850   TRIG 99 04/13/2019 0850   HDL 85 04/13/2019 0850   CHOLHDL 2.1 04/13/2019 0850   LDLCALC 75 04/13/2019 0850      Wt Readings from Last 3 Encounters:  05/12/19 140 lb 9.6 oz (63.8 kg)  04/17/19 140 lb 9.6 oz (63.8 kg)  04/10/19 135 lb (61.2 kg)        No flowsheet data found.    ASSESSMENT AND PLAN:  1.  Renal artery stenosis: She has significant restenosis affecting the left renal artery stent.  Her blood pressure is not controlled in spite of 3 antihypertensive medications with poor tolerance to multiple medications.  In addition, she has chronic kidney disease with some worsening recently.  Due to that, I recommend proceeding with renal artery angiography and  possible endovascular intervention.  I discussed the procedure in details as well as risks and benefits.    2.  Carotid artery disease: Continue with yearly Doppler..  Continue aggressive treatment of risk factors.  She continues to be on dual antiplatelet therapy.  3.  Hyperlipidemia: Given her extensive vascular disease, consider a more potent statin and an LDL target below 70.    Disposition:   FU with me in 1 months  Signed,  Kathlyn Sacramento, MD  05/14/2019 2:02 PM    [place]

## 2019-05-12 NOTE — Patient Instructions (Addendum)
    [address] [place] [address] Dept: [phone] Loc: [phone]  Tamara King  05/12/2019  You are scheduled for a Renal Cath on Wednesday 05/20/19,with Dr.Arida .  1. Please arrive at the [facility] (Main Entrance A) at [facility]: [address] [place], [place] [address] Hospital will call in early morning  to have you come in 4 hours before procedure for IV Hydration (This time is two hours before your procedure to ensure your preparation). Free valet parking service is available.   Special note: Every effort is made to have your procedure done on time. Please understand that emergencies sometimes delay scheduled procedures.  2. Diet: Do not eat solid foods after midnight.  The patient may have clear liquids until 5am upon the day of the procedure.  3. Labs: You will need to have blood drawn on Tuesday 4/20 You do not need to be fasting.    Covid Test Friday 4/23 at 1:25 pm at [facility] under brick awning.  4. Medication instructions in preparation for your procedure:  Hold Lasix morning of procedure    On the morning of your procedure, take your Aspirin 81 mg and Plavix 75 mg and any morning medicines NOT listed above.  You may use sips of water.  5. Plan for one night stay--bring personal belongings. 6. Bring a current list of your medications and current insurance cards. 7. You MUST have a responsible person to drive you home. 8. Someone MUST be with you the first 24 hours after you arrive home or your discharge will be delayed. 9. Please wear clothes that are easy to get on and off and wear slip-on shoes.  Thank you for allowing [place] to care for you!   -- [facility]

## 2019-05-12 NOTE — H&P (View-Only) (Signed)
Cardiology Office Note   Date:  05/14/2019   ID:  Tamara, King 1940/12/22, MRN [id number]  PCP:  Loman Brooklyn, FNP  Cardiologist: Dr. Domenic Polite  No chief complaint on file.     History of Present Illness: Tamara King is a 79 y.o. female who is here today for follow-up visit regarding renal artery stenosis.   She has history of mild nonobstructive coronary artery disease on previous cardiac catheterization in 2003.  She has known history of renal artery stenosis.  She underwent bilateral renal artery stenting in December 2003.  She had restenosis in 2005 and had repeat angiography by Dr. Albertine Patricia.  She underwent bilateral angioplasty for in-stent restenosis.  No reintervention since then.  She has chronic medical conditions that include difficult to control hypertension, hyperlipidemia, carotid disease with known occluded left carotid artery, mild chronic kidney disease, previous tobacco use and left bundle branch block. She has intolerance to multiple antihypertensive medications according to the notes.  She is not able to tolerate ACE inhibitors or ARB due to an allergic reaction.  Hydrochlorothiazide caused constipation.  Spironolactone caused worsening renal function.   She had recent renal artery duplex which showed significant restenosis only on the left side.  During last visit, I switch metoprolol to carvedilol which was subsequently uptitrated by Dr. Domenic Polite.  Her blood pressure continues to be elevated.  In addition, there was some worsening of renal function with recent creatinine of 1.6.  No chest pain or shortness of breath.   Past Medical History:  Diagnosis Date  . Breast cancer ([place])    Adenocarcinoma  . Chronic edema   . CKD (chronic kidney disease) stage 3, GFR 30-59 ml/min 10/22/2018  . Coronary atherosclerosis of native coronary artery    Nonobstructive at catheterization 2003, LVEF normal   . Essential hypertension   . GERD (gastroesophageal reflux  disease) 07/06/2013  . Idiopathic peripheral neuropathy 10/06/2013  . Mixed hyperlipidemia   . Occlusion and stenosis of left carotid artery 10/22/2017  . Renal artery stenosis ([facility])    BMS bilateally 2003, PTCA bilaterally 2005 with restenosis  . Sleep apnea    Stop Bang score of 4  . Stroke ([place])   . Symptomatic varicose veins, right 10/16/2018    Past Surgical History:  Procedure Laterality Date  . APPENDECTOMY    . CATARACT EXTRACTION W/PHACO  03/29/2011   Procedure: CATARACT EXTRACTION PHACO AND INTRAOCULAR LENS PLACEMENT (IOC);  Surgeon: Tonny Branch, MD;  Location: [facility];  Service: Ophthalmology;  Laterality: Right;  CDE:12.82  . CATARACT EXTRACTION W/PHACO  04/16/2011   Procedure: CATARACT EXTRACTION PHACO AND INTRAOCULAR LENS PLACEMENT (IOC);  Surgeon: Tonny Branch, MD;  Location: [facility];  Service: Ophthalmology;  Laterality: Left;  CDE: 11.54  . CESAREAN SECTION     x2  . CHOLECYSTECTOMY  2018  . HEMORRHOID SURGERY    . Right breast lumpectomy    . ROTATOR CUFF REPAIR  2008  . UPPER EXTREMITY ANGIOGRAPHY N/A 11/27/2017   Procedure: UPPER EXTREMITY ANGIOGRAPHY;  Surgeon: Marty Heck, MD;  Location: [facility];  Service: Cardiovascular;  Laterality: N/A;     Current Outpatient Medications  Medication Sig Dispense Refill  . albuterol (VENTOLIN HFA) 108 (90 Base) MCG/ACT inhaler Inhale 2 puffs into the lungs every 6 (six) hours as needed for wheezing or shortness of breath. 18 g 0  . amLODipine (NORVASC) 10 MG tablet Take 1 tablet by mouth once daily (Patient taking differently: Take  10 mg by mouth daily. ) 90 tablet 0  . aspirin EC 81 MG tablet Take 81 mg by mouth daily.    . carvedilol (COREG) 12.5 MG tablet Take 1 tablet (12.5 mg total) by mouth 2 (two) times daily. 180 tablet 3  . cholecalciferol (VITAMIN D3) 25 MCG (1000 UNIT) tablet Take 1,000 Units by mouth daily.    . clopidogrel (PLAVIX) 75 MG tablet Take 1 tablet (75 mg total) by mouth daily. 90 tablet 1  .  EQ ALLERGY RELIEF, CETIRIZINE, 10 MG tablet Take 1 tablet by mouth once daily (Patient taking differently: Take 10 mg by mouth daily. ) 90 tablet 0  . famotidine (PEPCID) 20 MG tablet Take 1 tablet by mouth once daily (Patient taking differently: Take 20 mg by mouth daily. ) 90 tablet 0  . fluticasone (FLONASE) 50 MCG/ACT nasal spray Place 2 sprays into the nose daily as needed for allergies (congestion.).     Marland Kitchen furosemide (LASIX) 40 MG tablet Take 1 tablet (40 mg total) by mouth daily. 90 tablet 1  . hydrALAZINE (APRESOLINE) 10 MG tablet Take 2 tablets (20 mg total) by mouth 3 (three) times daily. 120 tablet 6  . ondansetron (ZOFRAN) 4 MG tablet Take 1 tablet (4 mg total) by mouth every 8 (eight) hours as needed for nausea or vomiting. 20 tablet 0  . pantoprazole (PROTONIX) 40 MG tablet TAKE 1 TABLET BY MOUTH ONCE DAILY BEFORE BREAKFAST (Patient taking differently: Take 40 mg by mouth daily. ) 90 tablet 0  . pravastatin (PRAVACHOL) 80 MG tablet Take 1 tablet (80 mg total) by mouth daily. 90 tablet 1  . acetaminophen (TYLENOL) 500 MG tablet Take 500 mg by mouth every 6 (six) hours as needed for moderate pain.    . Tetrahydrozoline HCl (VISINE OP) Place 2 drops into both eyes daily as needed (dry eyes).     No current facility-administered medications for this visit.    Allergies:   Losartan, Sulfa antibiotics, Codeine, Fish allergy, Other, Spironolactone, Vancomycin, and Penicillins    Social History:  The patient  reports that she quit smoking about 21 years ago. Her smoking use included cigarettes. She has a 35.00 pack-year smoking history. She has never used smokeless tobacco. She reports that she does not drink alcohol or use drugs.   Family History:  The patient's family history includes Brain cancer in her brother and sister; COPD in her sister; Diabetes in her brother, brother, father, sister, son, and son; Heart failure in her mother; Lung cancer in her father; Stroke (age of onset: 89) in  her mother.    ROS:  Please see the history of present illness.   Otherwise, review of systems are positive for none.   All other systems are reviewed and negative.    PHYSICAL EXAM: VS:  BP (!) 156/76   Pulse 73   Ht 5' 3.5" (1.613 m)   Wt 140 lb 9.6 oz (63.8 kg)   SpO2 96%   BMI 24.52 kg/m  , BMI Body mass index is 24.52 kg/m. GEN: Well nourished, well developed, in no acute distress  HEENT: normal  Neck: no JVD,  or masses.  Bilateral carotid bruits Cardiac: RRR; no  rubs, or gallops.  2 out of 6 systolic murmur in the aortic area which is early peaking.  There is mild bilateral leg edema with significant tenderness. Respiratory:  clear to auscultation bilaterally, normal work of breathing GI: soft, nontender, nondistended, + BS MS: no deformity or atrophy  Skin: warm and dry, no rash Neuro:  Strength and sensation are intact Psych: euthymic mood, full affect Vascular: Femoral pulses normal bilaterally.  Distal pulses are palpable.   EKG:  EKG is not ordered today.    Recent Labs: 10/22/2018: ALT 13 01/14/2019: Magnesium 2.1 05/12/2019: BUN 18; Creatinine, Ser 1.13; Hemoglobin 14.0; Platelets 244; Potassium 3.9; Sodium 138    Lipid Panel    Component Value Date/Time   CHOL 177 04/13/2019 0850   TRIG 99 04/13/2019 0850   HDL 85 04/13/2019 0850   CHOLHDL 2.1 04/13/2019 0850   LDLCALC 75 04/13/2019 0850      Wt Readings from Last 3 Encounters:  05/12/19 140 lb 9.6 oz (63.8 kg)  04/17/19 140 lb 9.6 oz (63.8 kg)  04/10/19 135 lb (61.2 kg)        No flowsheet data found.    ASSESSMENT AND PLAN:  1.  Renal artery stenosis: She has significant restenosis affecting the left renal artery stent.  Her blood pressure is not controlled in spite of 3 antihypertensive medications with poor tolerance to multiple medications.  In addition, she has chronic kidney disease with some worsening recently.  Due to that, I recommend proceeding with renal artery angiography and  possible endovascular intervention.  I discussed the procedure in details as well as risks and benefits.    2.  Carotid artery disease: Continue with yearly Doppler..  Continue aggressive treatment of risk factors.  She continues to be on dual antiplatelet therapy.  3.  Hyperlipidemia: Given her extensive vascular disease, consider a more potent statin and an LDL target below 70.    Disposition:   FU with me in 1 months  Signed,  Kathlyn Sacramento, MD  05/14/2019 2:02 PM    [place]

## 2019-05-13 LAB — CBC WITH DIFFERENTIAL/PLATELET
Basophils Absolute: 0.1 10*3/uL (ref 0.0–0.2)
Basos: 1 %
EOS (ABSOLUTE): 0.2 10*3/uL (ref 0.0–0.4)
Eos: 3 %
Hematocrit: 40.9 % (ref 34.0–46.6)
Hemoglobin: 14 g/dL (ref 11.1–15.9)
Immature Grans (Abs): 0 10*3/uL (ref 0.0–0.1)
Immature Granulocytes: 0 %
Lymphocytes Absolute: 2.2 10*3/uL (ref 0.7–3.1)
Lymphs: 30 %
MCH: 29.9 pg (ref 26.6–33.0)
MCHC: 34.2 g/dL (ref 31.5–35.7)
MCV: 87 fL (ref 79–97)
Monocytes Absolute: 0.4 10*3/uL (ref 0.1–0.9)
Monocytes: 6 %
Neutrophils Absolute: 4.3 10*3/uL (ref 1.4–7.0)
Neutrophils: 60 %
Platelets: 244 10*3/uL (ref 150–450)
RBC: 4.69 x10E6/uL (ref 3.77–5.28)
RDW: 12.3 % (ref 11.7–15.4)
WBC: 7.3 10*3/uL (ref 3.4–10.8)

## 2019-05-13 LAB — PT AND PTT

## 2019-05-13 LAB — BASIC METABOLIC PANEL
BUN/Creatinine Ratio: 16 (ref 12–28)
BUN: 18 mg/dL (ref 8–27)
CO2: 23 mmol/L (ref 20–29)
Calcium: 10.1 mg/dL (ref 8.7–10.3)
Chloride: 98 mmol/L (ref 96–106)
Creatinine, Ser: 1.13 mg/dL — ABNORMAL HIGH (ref 0.57–1.00)
GFR calc Af Amer: 55 mL/min/{1.73_m2} — ABNORMAL LOW (ref >59)
GFR calc non Af Amer: 47 mL/min/{1.73_m2} — ABNORMAL LOW (ref >59)
Glucose: 106 mg/dL — ABNORMAL HIGH (ref 65–99)
Potassium: 3.9 mmol/L (ref 3.5–5.2)
Sodium: 138 mmol/L (ref 134–144)

## 2019-05-15 ENCOUNTER — Other Ambulatory Visit (HOSPITAL_COMMUNITY)
Admission: RE | Admit: 2019-05-15 | Discharge: 2019-05-15 | Disposition: A | Discharge status: Home / Self Care | Payer: Medicare Other | Source: Ambulatory Visit | Attending: Cardiovascular Disease | Admitting: Cardiovascular Disease

## 2019-05-15 DIAGNOSIS — Z01812 Encounter for preprocedural laboratory examination: Secondary | ICD-10-CM | POA: Insufficient documentation

## 2019-05-15 DIAGNOSIS — Z20822 Contact with and (suspected) exposure to covid-19: Secondary | ICD-10-CM | POA: Insufficient documentation

## 2019-05-15 LAB — SARS CORONAVIRUS 2 (TAT 6-24 HRS): SARS Coronavirus 2: NEGATIVE

## 2019-05-19 ENCOUNTER — Telehealth: Payer: Self-pay | Admitting: *Deleted

## 2019-05-19 NOTE — Telephone Encounter (Signed)
Pt contacted pre-renal angiography  scheduled at [facility] for: Wednesday May 20, 2019 12:30 PM Verified arrival time and place: [facility]) at: 7:30 AM-pre procedure hydration per Dr Fletcher Anon   No solid food after midnight prior to cath, clear liquids until 5 AM day of procedure.  Hold: Lasix-day before and day of procedure-GFR 47 -pt already taken today  Except hold medications AM meds can be  taken pre-cath with sip of water including: ASA 81 mg Plavix 75 mg   Confirmed patient has responsible adult to drive home post procedure and observe 24 hours after arriving home: yes  You are allowed ONE visitor in the waiting room during your procedures. Both you and your visitor must wear masks.      COVID-19 Pre-Screening Questions:  . In the past 7 to 10 days have you had a cough,  shortness of breath, headache, congestion, fever (100 or greater) body aches, chills, sore throat, or sudden loss of taste or sense of smell? no . Have you been around anyone with known Covid 19 in the past 7 to 10 days? no . Have you been around anyone who is awaiting Covid 19 test results in the past 7 to 10 days? no . Have you been around anyone who  has mentioned symptoms of Covid 19 within the past 7 to 10 days? no   Reviewed procedure/mask/visitor instructions, COVID-19 screening questions with patient.

## 2019-05-20 ENCOUNTER — Encounter (HOSPITAL_COMMUNITY)
Admission: RE | Disposition: A | Discharge status: Home / Self Care | Payer: Medicare Other | Source: Ambulatory Visit | Attending: Cardiovascular Disease

## 2019-05-20 ENCOUNTER — Ambulatory Visit (HOSPITAL_COMMUNITY)
Admission: RE | Admit: 2019-05-20 | Discharge: 2019-05-20 | Disposition: A | Discharge status: Home / Self Care | Payer: Medicare Other | Source: Ambulatory Visit | Attending: Cardiovascular Disease | Admitting: Cardiovascular Disease

## 2019-05-20 ENCOUNTER — Other Ambulatory Visit: Payer: Self-pay | Admitting: *Deleted

## 2019-05-20 ENCOUNTER — Other Ambulatory Visit: Payer: Self-pay

## 2019-05-20 DIAGNOSIS — G4730 Sleep apnea, unspecified: Secondary | ICD-10-CM | POA: Diagnosis not present

## 2019-05-20 DIAGNOSIS — I701 Atherosclerosis of renal artery: Secondary | ICD-10-CM

## 2019-05-20 DIAGNOSIS — Z882 Allergy status to sulfonamides status: Secondary | ICD-10-CM | POA: Insufficient documentation

## 2019-05-20 DIAGNOSIS — Z881 Allergy status to other antibiotic agents status: Secondary | ICD-10-CM | POA: Insufficient documentation

## 2019-05-20 DIAGNOSIS — Z8673 Personal history of transient ischemic attack (TIA), and cerebral infarction without residual deficits: Secondary | ICD-10-CM | POA: Insufficient documentation

## 2019-05-20 DIAGNOSIS — Z7902 Long term (current) use of antithrombotics/antiplatelets: Secondary | ICD-10-CM | POA: Insufficient documentation

## 2019-05-20 DIAGNOSIS — I2510 Atherosclerotic heart disease of native coronary artery without angina pectoris: Secondary | ICD-10-CM | POA: Insufficient documentation

## 2019-05-20 DIAGNOSIS — Z885 Allergy status to narcotic agent status: Secondary | ICD-10-CM | POA: Insufficient documentation

## 2019-05-20 DIAGNOSIS — Z79899 Other long term (current) drug therapy: Secondary | ICD-10-CM | POA: Insufficient documentation

## 2019-05-20 DIAGNOSIS — N183 Chronic kidney disease, stage 3 unspecified: Secondary | ICD-10-CM | POA: Diagnosis not present

## 2019-05-20 DIAGNOSIS — K219 Gastro-esophageal reflux disease without esophagitis: Secondary | ICD-10-CM | POA: Insufficient documentation

## 2019-05-20 DIAGNOSIS — Z87891 Personal history of nicotine dependence: Secondary | ICD-10-CM | POA: Diagnosis not present

## 2019-05-20 DIAGNOSIS — G609 Hereditary and idiopathic neuropathy, unspecified: Secondary | ICD-10-CM | POA: Insufficient documentation

## 2019-05-20 DIAGNOSIS — I129 Hypertensive chronic kidney disease with stage 1 through stage 4 chronic kidney disease, or unspecified chronic kidney disease: Secondary | ICD-10-CM | POA: Diagnosis not present

## 2019-05-20 DIAGNOSIS — Z7982 Long term (current) use of aspirin: Secondary | ICD-10-CM | POA: Insufficient documentation

## 2019-05-20 DIAGNOSIS — Z8249 Family history of ischemic heart disease and other diseases of the circulatory system: Secondary | ICD-10-CM | POA: Diagnosis not present

## 2019-05-20 DIAGNOSIS — Z823 Family history of stroke: Secondary | ICD-10-CM | POA: Insufficient documentation

## 2019-05-20 DIAGNOSIS — E782 Mixed hyperlipidemia: Secondary | ICD-10-CM | POA: Insufficient documentation

## 2019-05-20 DIAGNOSIS — I6522 Occlusion and stenosis of left carotid artery: Secondary | ICD-10-CM | POA: Insufficient documentation

## 2019-05-20 DIAGNOSIS — Z880 Allergy status to penicillin: Secondary | ICD-10-CM | POA: Diagnosis not present

## 2019-05-20 HISTORY — PX: RENAL ANGIOGRAPHY: CATH118260

## 2019-05-20 HISTORY — PX: RENAL INTERVENTION: CATH118261

## 2019-05-20 LAB — POCT ACTIVATED CLOTTING TIME: Activated Clotting Time: 169 seconds

## 2019-05-20 SURGERY — RENAL ANGIOGRAPHY
Anesthesia: LOCAL

## 2019-05-20 MED ORDER — SODIUM CHLORIDE 0.9 % WEIGHT BASED INFUSION
1.0000 mL/kg/h | INTRAVENOUS | Status: DC
  Administered 2019-05-20: 09:00:00 1 mL/kg/h via INTRAVENOUS

## 2019-05-20 MED ORDER — ONDANSETRON HCL 4 MG/2ML IJ SOLN: 4.0000 mg | Freq: Four times a day (QID) | INTRAMUSCULAR | Status: DC | PRN

## 2019-05-20 MED ORDER — SODIUM CHLORIDE 0.9% FLUSH: 3.0000 mL | INTRAVENOUS | Status: DC | PRN

## 2019-05-20 MED ORDER — FENTANYL CITRATE (PF) 100 MCG/2ML IJ SOLN
INTRAMUSCULAR | Status: DC | PRN
  Administered 2019-05-20: 25 ug via INTRAVENOUS

## 2019-05-20 MED ORDER — ACETAMINOPHEN 325 MG PO TABS: 650.0000 mg | ORAL_TABLET | ORAL | Status: DC | PRN

## 2019-05-20 MED ORDER — SODIUM CHLORIDE 0.9 % IV SOLN: 250.0000 mL | INTRAVENOUS | Status: DC | PRN

## 2019-05-20 MED ORDER — HEPARIN (PORCINE) IN NACL 1000-0.9 UT/500ML-% IV SOLN
INTRAVENOUS | Status: DC | PRN
  Administered 2019-05-20 (×2): 500 mL

## 2019-05-20 MED ORDER — SODIUM CHLORIDE 0.9 % IV SOLN: INTRAVENOUS | Status: DC

## 2019-05-20 MED ORDER — HEPARIN (PORCINE) IN NACL 1000-0.9 UT/500ML-% IV SOLN
INTRAVENOUS | Status: AC
  Filled 2019-05-20: qty 1000

## 2019-05-20 MED ORDER — FENTANYL CITRATE (PF) 100 MCG/2ML IJ SOLN
INTRAMUSCULAR | Status: AC
  Filled 2019-05-20: qty 2

## 2019-05-20 MED ORDER — MIDAZOLAM HCL 2 MG/2ML IJ SOLN
INTRAMUSCULAR | Status: DC | PRN
  Administered 2019-05-20: 1 mg via INTRAVENOUS

## 2019-05-20 MED ORDER — LIDOCAINE HCL (PF) 1 % IJ SOLN
INTRAMUSCULAR | Status: DC | PRN
  Administered 2019-05-20: 30 mL

## 2019-05-20 MED ORDER — LIDOCAINE HCL (PF) 1 % IJ SOLN
INTRAMUSCULAR | Status: AC
  Filled 2019-05-20: qty 30

## 2019-05-20 MED ORDER — IODIXANOL 320 MG/ML IV SOLN
INTRAVENOUS | Status: DC | PRN
  Administered 2019-05-20: 14:00:00 40 mg via INTRA_ARTERIAL

## 2019-05-20 MED ORDER — SODIUM CHLORIDE 0.9 % WEIGHT BASED INFUSION
3.0000 mL/kg/h | INTRAVENOUS | Status: AC
  Administered 2019-05-20: 3 mL/kg/h via INTRAVENOUS

## 2019-05-20 MED ORDER — HEPARIN SODIUM (PORCINE) 1000 UNIT/ML IJ SOLN
INTRAMUSCULAR | Status: AC
  Filled 2019-05-20: qty 1

## 2019-05-20 MED ORDER — SODIUM CHLORIDE 0.9% FLUSH: 3.0000 mL | Freq: Two times a day (BID) | INTRAVENOUS | Status: DC

## 2019-05-20 MED ORDER — HEPARIN SODIUM (PORCINE) 1000 UNIT/ML IJ SOLN
INTRAMUSCULAR | Status: DC | PRN
  Administered 2019-05-20: 4000 [IU] via INTRAVENOUS

## 2019-05-20 MED ORDER — ASPIRIN 81 MG PO CHEW: 81.0000 mg | CHEWABLE_TABLET | ORAL | Status: DC

## 2019-05-20 MED ORDER — HYDRALAZINE HCL 20 MG/ML IJ SOLN: 5.0000 mg | INTRAMUSCULAR | Status: DC | PRN

## 2019-05-20 MED ORDER — MIDAZOLAM HCL 2 MG/2ML IJ SOLN
INTRAMUSCULAR | Status: AC
  Filled 2019-05-20: qty 2

## 2019-05-20 SURGICAL SUPPLY — 17 items
BALLN VIATRAC 5X15X135 (BALLOONS) ×3
BALLOON VIATRAC 5X15X135 (BALLOONS) IMPLANT
CATH ANGIO 5F PIGTAIL 65CM (CATHETERS) ×2 IMPLANT
CLOSURE MYNX CONTROL 6F/7F (Vascular Products) ×1 IMPLANT
GUIDE CATH VISTA RDC 6F (CATHETERS) ×1 IMPLANT
KIT ENCORE 26 ADVANTAGE (KITS) ×1 IMPLANT
KIT MICROPUNCTURE NIT STIFF (SHEATH) ×1 IMPLANT
KIT PV (KITS) ×3 IMPLANT
SHEATH PINNACLE 6F 10CM (SHEATH) ×1 IMPLANT
SHEATH PROBE COVER 6X72 (BAG) ×1 IMPLANT
STOPCOCK MORSE 400PSI 3WAY (MISCELLANEOUS) ×1 IMPLANT
SYR MEDRAD MARK 7 150ML (SYRINGE) ×3 IMPLANT
TRANSDUCER W/STOPCOCK (MISCELLANEOUS) ×3 IMPLANT
TRAY PV CATH (CUSTOM PROCEDURE TRAY) ×3 IMPLANT
TUBING CIL FLEX 10 FLL-RA (TUBING) ×1 IMPLANT
WIRE BENTSON .035X145CM (WIRE) ×1 IMPLANT
WIRE STABILIZER XS .014X180CM (WIRE) ×1 IMPLANT

## 2019-05-20 NOTE — Discharge Instructions (Signed)
Angiogram, Care After This sheet gives you information about how to care for yourself after your procedure. Your health care provider may also give you more specific instructions. If you have problems or questions, contact your health care provider. What can I expect after the procedure? After the procedure, it is common to have bruising and tenderness at the catheter insertion area. Follow these instructions at home: Insertion site care  Follow instructions from your health care provider about how to take care of your insertion site. Make sure you: ? Wash your hands with soap and water before you change your bandage (dressing). If soap and water are not available, use hand sanitizer. ? Change your dressing as told by your health care provider. ? Leave stitches (sutures), skin glue, or adhesive strips in place. These skin closures may need to stay in place for 2 weeks or longer. If adhesive strip edges start to loosen and curl up, you may trim the loose edges. Do not remove adhesive strips completely unless your health care provider tells you to do that.  Do not take baths, swim, or use a hot tub until your health care provider approves.  You may shower 24-48 hours after the procedure or as told by your health care provider. ? Gently wash the site with plain soap and water. ? Pat the area dry with a clean towel. ? Do not rub the site. This may cause bleeding.  Do not apply powder or lotion to the site. Keep the site clean and dry.  Check your insertion site every day for signs of infection. Check for: ? Redness, swelling, or pain. ? Fluid or blood. ? Warmth. ? Pus or a bad smell. Activity  Rest as told by your health care provider, usually for 1-2 days.  Do not lift anything that is heavier than 10 lbs. (4.5 kg) or as told by your health care provider.  Do not drive for 24 hours if you were given a medicine to help you relax (sedative).  Do not drive or use heavy machinery while  taking prescription pain medicine. General instructions   Return to your normal activities as told by your health care provider, usually in about a week. Ask your health care provider what activities are safe for you.  If the catheter site starts bleeding, lie flat and put pressure on the site. If the bleeding does not stop, get help right away. This is a medical emergency.  Drink enough fluid to keep your urine clear or pale yellow. This helps flush the contrast dye from your body.  Take over-the-counter and prescription medicines only as told by your health care provider.  Keep all follow-up visits as told by your health care provider. This is important. Contact a health care provider if:  You have a fever or chills.  You have redness, swelling, or pain around your insertion site.  You have fluid or blood coming from your insertion site.  The insertion site feels warm to the touch.  You have pus or a bad smell coming from your insertion site.  You have bruising around the insertion site.  You notice blood collecting in the tissue around the catheter site (hematoma). The hematoma may be painful to the touch. Get help right away if:  You have severe pain at the catheter insertion area.  The catheter insertion area swells very fast.  The catheter insertion area is bleeding, and the bleeding does not stop when you hold steady pressure on the area.    The area near or just beyond the catheter insertion site becomes pale, cool, tingly, or numb. These symptoms may represent a serious problem that is an emergency. Do not wait to see if the symptoms will go away. Get medical help right away. Call your local emergency services (911 in the [place] Do not drive yourself to the hospital. Summary  After the procedure, it is common to have bruising and tenderness at the catheter insertion area.  After the procedure, it is important to rest and drink plenty of fluids.  Do not take baths,  swim, or use a hot tub until your health care provider says it is okay to do so. You may shower 24-48 hours after the procedure or as told by your health care provider.  If the catheter site starts bleeding, lie flat and put pressure on the site. If the bleeding does not stop, get help right away. This is a medical emergency. This information is not intended to replace advice given to you by your health care provider. Make sure you discuss any questions you have with your health care provider. Document Revised: 12/21/2016 Document Reviewed: 12/14/2015 Elsevier Patient Education  2020 [employer]  

## 2019-05-20 NOTE — Interval H&P Note (Signed)
History and Physical Interval Note:  05/20/2019 12:48 PM  Tamara King  has presented today for surgery, with the diagnosis of Renal Artery Stenosis.  The various methods of treatment have been discussed with the patient and family. After consideration of risks, benefits and other options for treatment, the patient has consented to  Procedure(s): RENAL ANGIOGRAPHY (N/A) as a surgical intervention.  The patient's history has been reviewed, patient examined, no change in status, stable for surgery.  I have reviewed the patient's chart and labs.  Questions were answered to the patient's satisfaction.     Kathlyn Sacramento

## 2019-06-02 ENCOUNTER — Other Ambulatory Visit (HOSPITAL_COMMUNITY): Payer: Self-pay | Admitting: Cardiovascular Disease

## 2019-06-02 ENCOUNTER — Ambulatory Visit (HOSPITAL_COMMUNITY)
Admission: RE | Admit: 2019-06-02 | Discharge: 2019-06-02 | Disposition: A | Discharge status: Home / Self Care | Payer: Medicare Other | Source: Ambulatory Visit | Attending: Cardiology | Admitting: Cardiology

## 2019-06-02 ENCOUNTER — Other Ambulatory Visit: Payer: Self-pay

## 2019-06-02 DIAGNOSIS — Z9889 Other specified postprocedural states: Secondary | ICD-10-CM

## 2019-06-02 DIAGNOSIS — I701 Atherosclerosis of renal artery: Secondary | ICD-10-CM | POA: Insufficient documentation

## 2019-06-16 ENCOUNTER — Encounter: Payer: Self-pay | Admitting: Cardiovascular Disease

## 2019-06-16 ENCOUNTER — Other Ambulatory Visit: Payer: Self-pay

## 2019-06-16 ENCOUNTER — Ambulatory Visit (INDEPENDENT_AMBULATORY_CARE_PROVIDER_SITE_OTHER): Payer: Medicare Other | Admitting: Cardiovascular Disease

## 2019-06-16 VITALS — BP 150/74 | HR 77 | Ht 63.5 in | Wt 139.0 lb

## 2019-06-16 DIAGNOSIS — I779 Disorder of arteries and arterioles, unspecified: Secondary | ICD-10-CM

## 2019-06-16 DIAGNOSIS — E785 Hyperlipidemia, unspecified: Secondary | ICD-10-CM

## 2019-06-16 DIAGNOSIS — I701 Atherosclerosis of renal artery: Secondary | ICD-10-CM

## 2019-06-16 NOTE — Patient Instructions (Signed)
Medication Instructions:  No changes *If you need a refill on your cardiac medications before your next appointment, please call your pharmacy*   Lab Work: None ordered If you have labs (blood work) drawn today and your tests are completely normal, you will receive your results only by: Marland Kitchen MyChart Message (if you have MyChart) OR . A paper copy in the mail If you have any lab test that is abnormal or we need to change your treatment, we will call you to review the results.   Testing/Procedures: Your physician has requested that you have a renal artery duplex in November. During this test, an ultrasound is used to evaluate blood flow to the kidneys. Take your medications as you usually do. This will take place at [address], [address].   No food after 11PM the night before.  Water is OK. (Don't drink liquids if you have been instructed not to for ANOTHER test).  Take two Extra-Strength Gas-X capsules at bedtime the night before test.   Take an additional two Extra-Strength Gas-X capsules three (3) hours before the test or first thing in the morning.    Avoid foods that produce bowel gas, for 24 hours prior to exam (see below).    No breakfast, no chewing gum, no smoking or carbonated beverages.  Patient may take morning medications with water.  Come in for test at least 15 minutes early to register.    Follow-Up: At [facility], you and your health needs are our priority.  As part of our continuing mission to provide you with exceptional heart care, we have created designated Provider Care Teams.  These Care Teams include your primary Cardiologist (physician) and Advanced Practice Providers (APPs -  Physician Assistants and Nurse Practitioners) who all work together to provide you with the care you need, when you need it.  We recommend signing up for the patient portal called "MyChart".  Sign up information is provided on this After Visit Summary.  MyChart is used to  connect with patients for Virtual Visits (Telemedicine).  Patients are able to view lab/test results, encounter notes, upcoming appointments, etc.  Non-urgent messages can be sent to your provider as well.   To learn more about what you can do with MyChart, go to [email or web address].    Your next appointment:   6 month(s) after the Renal Doppler  The format for your next appointment:   In Person  Provider:   Kathlyn Sacramento, MD

## 2019-06-16 NOTE — Progress Notes (Signed)
Cardiology Office Note   Date:  06/16/2019   ID:  Tamara, King [birth year], MRN [id number]  PCP:  Loman Brooklyn, FNP  Cardiologist: Dr. Domenic Polite  No chief complaint on file.     History of Present Illness: Tamara King is a 79 y.o. female who is here today for follow-up visit regarding renal artery stenosis.   She has history of mild nonobstructive coronary artery disease on previous cardiac catheterization in 2003.  She has known history of renal artery stenosis.  She underwent bilateral renal artery stenting in December 2003.  She had restenosis in 2005 and had repeat angiography by Dr. Albertine Patricia.  She underwent bilateral angioplasty for in-stent restenosis. She has chronic medical conditions that include difficult to control hypertension, hyperlipidemia, carotid disease with known occluded left carotid artery, mild chronic kidney disease, previous tobacco use and left bundle branch block. She has intolerance to multiple antihypertensive medications according to the notes.  She is not able to tolerate ACE inhibitors or ARB due to an allergic reaction.  Hydrochlorothiazide caused constipation.  Spironolactone caused worsening renal function.   She was seen recently for severe left renal artery in-stent restenosis with uncontrolled hypertension. I proceeded with angiography last month which showed patent bilateral renal artery stent with significant restenosis on the left side.  I performed successful balloon angioplasty of the left renal artery stent.  Postprocedure duplex showed improved velocity but still elevated in the left side at 291. She has been doing reasonably well.  Blood pressure continues to be mildly elevated.  No chest pain or shortness of breath.  She does complain of right hip discomfort which started about 2 to 3 days ago.  She thinks she has arthritis.    Past Medical History:  Diagnosis Date  . Breast cancer ([place])    Adenocarcinoma  . Chronic edema   . CKD  (chronic kidney disease) stage 3, GFR 30-59 ml/min 10/22/2018  . Coronary atherosclerosis of native coronary artery    Nonobstructive at catheterization 2003, LVEF normal   . Essential hypertension   . GERD (gastroesophageal reflux disease) 07/06/2013  . Idiopathic peripheral neuropathy 10/06/2013  . Mixed hyperlipidemia   . Occlusion and stenosis of left carotid artery 10/22/2017  . Renal artery stenosis ([facility])    BMS bilateally 2003, PTCA bilaterally 2005 with restenosis  . Sleep apnea    Stop Bang score of 4  . Stroke ([place])   . Symptomatic varicose veins, right 10/16/2018    Past Surgical History:  Procedure Laterality Date  . APPENDECTOMY    . CATARACT EXTRACTION W/PHACO  03/29/2011   Procedure: CATARACT EXTRACTION PHACO AND INTRAOCULAR LENS PLACEMENT (IOC);  Surgeon: Tonny Branch, MD;  Location: [facility];  Service: Ophthalmology;  Laterality: Right;  CDE:12.82  . CATARACT EXTRACTION W/PHACO  04/16/2011   Procedure: CATARACT EXTRACTION PHACO AND INTRAOCULAR LENS PLACEMENT (IOC);  Surgeon: Tonny Branch, MD;  Location: [facility];  Service: Ophthalmology;  Laterality: Left;  CDE: 11.54  . CESAREAN SECTION     x2  . CHOLECYSTECTOMY  2018  . HEMORRHOID SURGERY    . RENAL ANGIOGRAPHY N/A 05/20/2019   Procedure: RENAL ANGIOGRAPHY;  Surgeon: Wellington Hampshire, MD;  Location: [facility];  Service: Cardiovascular;  Laterality: N/A;  . RENAL INTERVENTION  05/20/2019   Procedure: RENAL INTERVENTION;  Surgeon: Wellington Hampshire, MD;  Location: [facility];  Service: Cardiovascular;;  . Right breast lumpectomy    . ROTATOR CUFF REPAIR  2008  . UPPER EXTREMITY ANGIOGRAPHY N/A 11/27/2017   Procedure: UPPER EXTREMITY ANGIOGRAPHY;  Surgeon: Marty Heck, MD;  Location: [facility];  Service: Cardiovascular;  Laterality: N/A;     Current Outpatient Medications  Medication Sig Dispense Refill  . acetaminophen (TYLENOL) 500 MG tablet Take 500 mg by mouth every 6 (six) hours as needed  for moderate pain.    Marland Kitchen albuterol (VENTOLIN HFA) 108 (90 Base) MCG/ACT inhaler Inhale 2 puffs into the lungs every 6 (six) hours as needed for wheezing or shortness of breath. 18 g 0  . amLODipine (NORVASC) 10 MG tablet Take 1 tablet by mouth once daily (Patient taking differently: Take 10 mg by mouth daily. ) 90 tablet 0  . aspirin EC 81 MG tablet Take 81 mg by mouth daily.    . carvedilol (COREG) 12.5 MG tablet Take 1 tablet (12.5 mg total) by mouth 2 (two) times daily. 180 tablet 3  . cholecalciferol (VITAMIN D3) 25 MCG (1000 UNIT) tablet Take 1,000 Units by mouth daily.    . clopidogrel (PLAVIX) 75 MG tablet Take 1 tablet (75 mg total) by mouth daily. 90 tablet 1  . EQ ALLERGY RELIEF, CETIRIZINE, 10 MG tablet Take 1 tablet by mouth once daily (Patient taking differently: Take 10 mg by mouth daily. ) 90 tablet 0  . famotidine (PEPCID) 20 MG tablet Take 1 tablet by mouth once daily (Patient taking differently: Take 20 mg by mouth daily. ) 90 tablet 0  . fluticasone (FLONASE) 50 MCG/ACT nasal spray Place 2 sprays into the nose daily as needed for allergies (congestion.).     Marland Kitchen furosemide (LASIX) 40 MG tablet Take 1 tablet (40 mg total) by mouth daily. 90 tablet 1  . hydrALAZINE (APRESOLINE) 10 MG tablet Take 2 tablets (20 mg total) by mouth 3 (three) times daily. 120 tablet 6  . ondansetron (ZOFRAN) 4 MG tablet Take 1 tablet (4 mg total) by mouth every 8 (eight) hours as needed for nausea or vomiting. 20 tablet 0  . pantoprazole (PROTONIX) 40 MG tablet TAKE 1 TABLET BY MOUTH ONCE DAILY BEFORE BREAKFAST (Patient taking differently: Take 40 mg by mouth daily. ) 90 tablet 0  . pravastatin (PRAVACHOL) 80 MG tablet Take 1 tablet (80 mg total) by mouth daily. 90 tablet 1  . Tetrahydrozoline HCl (VISINE OP) Place 2 drops into both eyes daily as needed (dry eyes).     No current facility-administered medications for this visit.    Allergies:   Losartan, Sulfa antibiotics, Codeine, Fish allergy, Other,  Spironolactone, Vancomycin, and Penicillins    Social History:  The patient  reports that she quit smoking about 21 years ago. Her smoking use included cigarettes. She has a 35.00 pack-year smoking history. She has never used smokeless tobacco. She reports that she does not drink alcohol or use drugs.   Family History:  The patient's family history includes Brain cancer in her brother and sister; COPD in her sister; Diabetes in her brother, brother, father, sister, son, and son; Heart failure in her mother; Lung cancer in her father; Stroke (age of onset: 11) in her mother.    ROS:  Please see the history of present illness.   Otherwise, review of systems are positive for none.   All other systems are reviewed and negative.    PHYSICAL EXAM: VS:  BP (!) 150/74   Pulse 77   Ht 5' 3.5" (1.613 m)   Wt 139 lb (63 kg)   SpO2  96%   BMI 24.24 kg/m  , BMI Body mass index is 24.24 kg/m. GEN: Well nourished, well developed, in no acute distress  HEENT: normal  Neck: no JVD,  or masses.  Bilateral carotid bruits Cardiac: RRR; no  rubs, or gallops.  2 out of 6 systolic murmur in the aortic area which is early peaking.  There is mild bilateral leg edema with significant tenderness. Respiratory:  clear to auscultation bilaterally, normal work of breathing GI: soft, nontender, nondistended, + BS MS: no deformity or atrophy  Skin: warm and dry, no rash Neuro:  Strength and sensation are intact Psych: euthymic mood, full affect Vascular: Femoral pulses normal bilaterally.  Distal pulses are palpable.  No groin hematoma   EKG:  EKG is not ordered today.    Recent Labs: 10/22/2018: ALT 13 01/14/2019: Magnesium 2.1 05/12/2019: BUN 18; Creatinine, Ser 1.13; Hemoglobin 14.0; Platelets 244; Potassium 3.9; Sodium 138    Lipid Panel    Component Value Date/Time   CHOL 177 04/13/2019 0850   TRIG 99 04/13/2019 0850   HDL 85 04/13/2019 0850   CHOLHDL 2.1 04/13/2019 0850   LDLCALC 75 04/13/2019  0850      Wt Readings from Last 3 Encounters:  06/16/19 139 lb (63 kg)  05/20/19 137 lb (62.1 kg)  05/12/19 140 lb 9.6 oz (63.8 kg)        No flowsheet data found.    ASSESSMENT AND PLAN:  1.  Renal artery stenosis: Status post recent balloon angioplasty of the left renal artery in-stent restenosis.  Velocities on Doppler improved but did not normalize.  Repeat Doppler in 6 months and continue current medications.   2.  Carotid artery disease: Continue with yearly Doppler..  Continue aggressive treatment of risk factors.  She continues to be on dual antiplatelet therapy.  3.  Hyperlipidemia: She is currently on high-dose pravastatin.  Most recent lipid profile showed an LDL of 75 which is close to target.  4.  Hypertension: Blood pressure is slightly improved but still not controlled.  I suggested increasing carvedilol to 25 mg twice daily but the patient wants to wait on current medications for now and monitor blood pressure.    Disposition:   FU with me in 6 months  Signed,  Kathlyn Sacramento, MD  06/16/2019 9:06 AM    [place]

## 2019-06-24 ENCOUNTER — Ambulatory Visit (INDEPENDENT_AMBULATORY_CARE_PROVIDER_SITE_OTHER): Payer: Medicare Other | Admitting: *Deleted

## 2019-06-24 DIAGNOSIS — Z0000 Encounter for general adult medical examination without abnormal findings: Secondary | ICD-10-CM | POA: Diagnosis not present

## 2019-06-24 NOTE — Progress Notes (Signed)
MEDICARE ANNUAL WELLNESS VISIT  06/24/2019  Telephone Visit Disclaimer This Medicare AWV was conducted by telephone due to national recommendations for restrictions regarding the COVID-19 Pandemic (e.g. social distancing).  I verified, using two identifiers, that I am speaking with Tamara King or their authorized healthcare agent. I discussed the limitations, risks, security, and privacy concerns of performing an evaluation and management service by telephone and the potential availability of an in-person appointment in the future. The patient expressed understanding and agreed to proceed.   Subjective:  Tamara King is a 79 y.o. female patient of Tamara Brooklyn, FNP who had a Medicare Annual Wellness Visit today via telephone. Tamara King is retired and widowed. She has 2 sons that reside with her as well as 2 granddaughters. She reports that she is socially active and does interact with friends/family regularly. She reports she is active around the house but does not engage in any formal exercise. She states she does not have any hobbies but does watch TV in her down time.   Patient Care Team: Tamara Brooklyn, FNP as PCP - General (Family Medicine) Tamara Sark, MD as PCP - Cardiology (Cardiology) Tamara Gerold, MD as Consulting Physician (Nephrology) Tamara Hampshire, MD as Consulting Physician (Cardiology)  Advanced Directives 06/24/2019 05/20/2019 11/28/2017 11/27/2017 06/17/2016 04/16/2011 03/22/2011  Does Patient Have a Medical Advance Directive? No No No No No Patient does not have advance directive Patient has advance directive, copy not in chart  Type of Advance Directive - - - - - - Press photographer;Living will  Copy of [place] in Chart? - - - - - - Copy requested from family  Would patient like information on creating a medical advance directive? No - Patient declined No - Patient declined - No - Patient declined Yes (ED - Information  included in AVS) - -  Pre-existing out of facility DNR order (yellow form or pink MOST form) - - - - - No No    Hospital Utilization Over the Past 12 Months: # of hospitalizations or ER visits: 0 # of surgeries: 1  Review of Systems    Patient reports that her overall health is unchanged compared to last year.  History obtained from the patient.  Patient Reported Readings (BP, Pulse, CBG, Weight, etc) none  Pain Assessment Pain : 0-10 Pain Score: 6  Pain Type: Acute pain Pain Location: Hip Pain Orientation: Right Pain Radiating Towards: leg Pain Descriptors / Indicators: Aching Pain Onset: In the past 7 days Pain Frequency: Intermittent Pain Relieving Factors: rest, tylenol Effect of Pain on Daily Activities: hurts when walking  Pain Relieving Factors: rest, tylenol  Current Medications & Allergies (verified) Allergies as of 06/24/2019      Reactions   Losartan Anaphylaxis   Sulfa Antibiotics Anaphylaxis, Swelling   Codeine Nausea And Vomiting   Fish Allergy Other (See Comments)   "sick as a dog"   Other Other (See Comments)   Bologna - vomiting   Spironolactone Other (See Comments)   Acute kidney injury   Vancomycin Other (See Comments)   "drives me crazy"   Penicillins Rash   Has patient had a PCN reaction causing immediate rash, facial/tongue/throat swelling, SOB or lightheadedness with hypotension: Unknown Has patient had a PCN reaction causing severe rash involving mucus membranes or skin necrosis: Unknown Has patient had a PCN reaction that required hospitalization: No Has patient had a PCN reaction occurring within the last 10 years:  No Childhood reaction. If all of the above answers are "NO", then may proceed with Cephalosporin use.      Medication List       Accurate as of June 24, 2019  9:03 AM. If you have any questions, ask your nurse or doctor.        acetaminophen 500 MG tablet Commonly known as: TYLENOL Take 500 mg by mouth every 6 (six)  hours as needed for moderate pain.   albuterol 108 (90 Base) MCG/ACT inhaler Commonly known as: VENTOLIN HFA Inhale 2 puffs into the lungs every 6 (six) hours as needed for wheezing or shortness of breath.   amLODipine 10 MG tablet Commonly known as: NORVASC Take 1 tablet by mouth once daily   aspirin EC 81 MG tablet Take 81 mg by mouth daily.   carvedilol 12.5 MG tablet Commonly known as: COREG Take 1 tablet (12.5 mg total) by mouth 2 (two) times daily.   cholecalciferol 25 MCG (1000 UNIT) tablet Commonly known as: VITAMIN D3 Take 1,000 Units by mouth daily.   clopidogrel 75 MG tablet Commonly known as: PLAVIX Take 1 tablet (75 mg total) by mouth daily.   EQ Allergy Relief (Cetirizine) 10 MG tablet Generic drug: cetirizine Take 1 tablet by mouth once daily What changed: how much to take   famotidine 20 MG tablet Commonly known as: PEPCID Take 1 tablet by mouth once daily   fluticasone 50 MCG/ACT nasal spray Commonly known as: FLONASE Place 2 sprays into the nose daily as needed for allergies (congestion.).   furosemide 40 MG tablet Commonly known as: LASIX Take 1 tablet (40 mg total) by mouth daily.   hydrALAZINE 10 MG tablet Commonly known as: APRESOLINE Take 2 tablets (20 mg total) by mouth 3 (three) times daily.   ondansetron 4 MG tablet Commonly known as: Zofran Take 1 tablet (4 mg total) by mouth every 8 (eight) hours as needed for nausea or vomiting.   pantoprazole 40 MG tablet Commonly known as: PROTONIX TAKE 1 TABLET BY MOUTH ONCE DAILY BEFORE BREAKFAST What changed: See the new instructions.   pravastatin 80 MG tablet Commonly known as: PRAVACHOL Take 1 tablet (80 mg total) by mouth daily.   VISINE OP Place 2 drops into both eyes daily as needed (dry eyes).       History (reviewed): Past Medical History:  Diagnosis Date  . Breast cancer ([place])    Adenocarcinoma  . Chronic edema   . CKD (chronic kidney disease) stage 3, GFR 30-59 ml/min  10/22/2018  . Coronary atherosclerosis of native coronary artery    Nonobstructive at catheterization 2003, LVEF normal   . Essential hypertension   . GERD (gastroesophageal reflux disease) 07/06/2013  . Idiopathic peripheral neuropathy 10/06/2013  . Mixed hyperlipidemia   . Occlusion and stenosis of left carotid artery 10/22/2017  . Renal artery stenosis ([facility])    BMS bilateally 2003, PTCA bilaterally 2005 with restenosis  . Sleep apnea    Stop Bang score of 4  . Stroke ([place])   . Symptomatic varicose veins, right 10/16/2018   Past Surgical History:  Procedure Laterality Date  . APPENDECTOMY    . CATARACT EXTRACTION W/PHACO  03/29/2011   Procedure: CATARACT EXTRACTION PHACO AND INTRAOCULAR LENS PLACEMENT (IOC);  Surgeon: Tonny Branch, MD;  Location: [facility];  Service: Ophthalmology;  Laterality: Right;  CDE:12.82  . CATARACT EXTRACTION W/PHACO  04/16/2011   Procedure: CATARACT EXTRACTION PHACO AND INTRAOCULAR LENS PLACEMENT (IOC);  Surgeon: Tonny Branch, MD;  Location:  [facility];  Service: Ophthalmology;  Laterality: Left;  CDE: 11.54  . CESAREAN SECTION     x2  . CHOLECYSTECTOMY  2018  . HEMORRHOID SURGERY    . RENAL ANGIOGRAPHY N/A 05/20/2019   Procedure: RENAL ANGIOGRAPHY;  Surgeon: Tamara Hampshire, MD;  Location: [facility];  Service: Cardiovascular;  Laterality: N/A;  . RENAL INTERVENTION  05/20/2019   Procedure: RENAL INTERVENTION;  Surgeon: Tamara Hampshire, MD;  Location: [facility];  Service: Cardiovascular;;  . Right breast lumpectomy    . ROTATOR CUFF REPAIR  2008  . UPPER EXTREMITY ANGIOGRAPHY N/A 11/27/2017   Procedure: UPPER EXTREMITY ANGIOGRAPHY;  Surgeon: Marty Heck, MD;  Location: [facility];  Service: Cardiovascular;  Laterality: N/A;   Family History  Problem Relation Age of Onset  . Heart failure Mother   . Stroke Mother 14  . Lung cancer Father   . Diabetes Father   . Diabetes Sister   . Brain cancer Sister   . Brain cancer Brother   .  Diabetes Brother   . Diabetes Brother   . COPD Sister   . Diabetes Son   . Cancer Son   . Diabetes Son   . Anesthesia problems Neg Hx   . Hypotension Neg Hx   . Malignant hyperthermia Neg Hx   . Pseudochol deficiency Neg Hx    Social History   Socioeconomic History  . Marital status: Widowed    Spouse name: Not on file  . Number of children: 2  . Years of education: 12th  . Highest education level: 12th grade  Occupational History    Employer: RETIRED    Comment: n/a  Tobacco Use  . Smoking status: Former Smoker    Packs/day: 1.00    Years: 35.00    Pack years: 35.00    Types: Cigarettes    Quit date: 01/22/1998    Years since quitting: 21.4  . Smokeless tobacco: Never Used  . Tobacco comment: tobacco use - no  Substance and Sexual Activity  . Alcohol use: No  . Drug use: No  . Sexual activity: Not on file  Other Topics Concern  . Not on file  Social History Narrative   Patient lives at home with family.  Two sons and two granddaughters live with her.   Caffeine use; 2-5 cups daily   Social Determinants of Health   Financial Resource Strain:   . Difficulty of Paying Living Expenses:   Food Insecurity:   . Worried About Charity fundraiser in the Last Year:   . Arboriculturist in the Last Year:   Transportation Needs:   . Film/video editor (Medical):   Marland Kitchen Lack of Transportation (Non-Medical):   Physical Activity:   . Days of Exercise per Week:   . Minutes of Exercise per Session:   Stress:   . Feeling of Stress :   Social Connections:   . Frequency of Communication with Friends and Family:   . Frequency of Social Gatherings with Friends and Family:   . Attends Religious Services:   . Active Member of Clubs or Organizations:   . Attends Archivist Meetings:   Marland Kitchen Marital Status:     Activities of Daily Living In your present state of health, do you have any difficulty performing the following activities: 06/24/2019  Hearing? N  Vision? N    Difficulty concentrating or making decisions? Y  Comment all of the above  sometimes  Walking or climbing stairs? Y  Comment acute hip pain  Dressing or bathing? N  Doing errands, shopping? N  Preparing Food and eating ? N  Using the Toilet? N  In the past six months, have you accidently leaked urine? Y  Comment stress incontinence  Do you have problems with loss of bowel control? N  Managing your Medications? N  Managing your Finances? N  Housekeeping or managing your Housekeeping? N  Some recent data might be hidden    Patient Education/ Literacy How often do you need to have someone help you when you read instructions, pamphlets, or other written materials from your doctor or pharmacy?: 1 - Never What is the last grade level you completed in school?: 12  Exercise Current Exercise Habits: The patient does not participate in regular exercise at present, Exercise limited by: orthopedic condition(s)(hip pain)  Diet Patient reports consuming 3 meals a day and 2 snack(s) a day Patient reports that her primary diet is: Regular Patient reports that she does have regular access to food.   Depression Screen PHQ 2/9 Scores 06/24/2019 04/17/2019 03/04/2019 01/14/2019 12/31/2018 11/19/2018 10/22/2018  PHQ - 2 Score 2 0 0 0 0 0 0  PHQ- 9 Score 5 - - - - - -     Fall Risk Fall Risk  06/24/2019 04/17/2019 03/04/2019 01/14/2019 12/31/2018  Falls in the past year? 0 0 0 0 0     Objective:  Tamara King seemed alert and oriented and she participated appropriately during our telephone visit.  Blood Pressure Weight BMI  BP Readings from Last 3 Encounters:  06/16/19 (!) 150/74  05/20/19 139/60  05/12/19 (!) 156/76   Wt Readings from Last 3 Encounters:  06/16/19 139 lb (63 kg)  05/20/19 137 lb (62.1 kg)  05/12/19 140 lb 9.6 oz (63.8 kg)   BMI Readings from Last 1 Encounters:  06/16/19 24.24 kg/m    *Unable to obtain current vital signs, weight, and BMI due to telephone visit  type  Hearing/Vision  . Aliene did not seem to have difficulty with hearing/understanding during the telephone conversation . Reports that she has had a formal eye exam by an eye care professional within the past year . Reports that she has not had a formal hearing evaluation within the past year *Unable to fully assess hearing and vision during telephone visit type  Cognitive Function: 6CIT Screen 06/24/2019  What Year? 0 points  What month? 0 points  What time? 0 points  Count back from 20 0 points  Months in reverse 0 points  Repeat phrase 0 points  Total Score 0   (Normal:0-7, Significant for Dysfunction: >8)  Normal Cognitive Function Screening: Yes   Immunization & Health Maintenance Record Immunization History  Administered Date(s) Administered  . Influenza, High Dose Seasonal PF 11/01/2014, 11/11/2015, 12/05/2015, 10/07/2018  . Influenza,trivalent, recombinat, inj, PF 12/07/2013, 10/23/2016  . Moderna SARS-COVID-2 Vaccination 04/20/2019, 05/18/2019  . Pneumococcal Conjugate-13 01/14/2019  . Pneumococcal Polysaccharide-23 10/22/2013  . Tdap 07/06/2013    Health Maintenance  Topic Date Due  . INFLUENZA VACCINE  08/23/2019  . MAMMOGRAM  10/21/2020  . TETANUS/TDAP  07/07/2023  . DEXA SCAN  Completed  . COVID-19 Vaccine  Completed  . PNA vac Low Risk Adult  Completed       Assessment  This is a routine wellness examination for Tamara King.  Health Maintenance: Due or Overdue There are no preventive care reminders to display for this patient.  Pamala Hurry  R Bensel does not need a referral for Community Assistance: Care Management:   no Social Work:    no Prescription Assistance:  no Nutrition/Diabetes Education:  no   Plan:  Personalized Goals Goals Addressed            This Visit's Progress   . Patient Stated       06/24/2019 AWV Goal: Keep All Scheduled Appointments  Over the next year, patient will attend all scheduled appointments with their PCP  and any specialists that they see.         Personalized Health Maintenance & Screening Recommendations     Lung Cancer Screening Recommended: no (Low Dose CT Chest recommended if Age 106-80 years, 30 pack-year currently smoking OR have quit w/in past 15 years) Hepatitis C Screening recommended: no HIV Screening recommended: no  Advanced Directives: Written information was not prepared per patient's request.  Referrals & Orders No orders of the defined types were placed in this encounter.   Follow-up Plan . Follow-up with Tamara Brooklyn, FNP as planned  I have personally reviewed and noted the following in the patient's chart:   . Medical and social history . Use of alcohol, tobacco or illicit drugs  . Current medications and supplements . Functional ability and status . Nutritional status . Physical activity . Advanced directives . List of other physicians . Hospitalizations, surgeries, and ER visits in previous 12 months . Vitals . Screenings to include cognitive, depression, and falls . Referrals and appointments  In addition, I have reviewed and discussed with Tamara King certain preventive protocols, quality metrics, and best practice recommendations. A written personalized care plan for preventive services as well as general preventive health recommendations is available and can be mailed to the patient at her request.      Baldomero Lamy, LPN [id number]

## 2019-07-13 ENCOUNTER — Other Ambulatory Visit: Payer: Self-pay | Admitting: Family Medicine

## 2019-07-13 DIAGNOSIS — K219 Gastro-esophageal reflux disease without esophagitis: Secondary | ICD-10-CM

## 2019-07-13 DIAGNOSIS — J302 Other seasonal allergic rhinitis: Secondary | ICD-10-CM

## 2019-07-21 ENCOUNTER — Other Ambulatory Visit: Payer: Self-pay | Admitting: Family Medicine

## 2019-07-21 ENCOUNTER — Encounter: Payer: Self-pay | Admitting: Family Medicine

## 2019-07-21 ENCOUNTER — Other Ambulatory Visit: Payer: Self-pay

## 2019-07-21 ENCOUNTER — Ambulatory Visit (INDEPENDENT_AMBULATORY_CARE_PROVIDER_SITE_OTHER): Payer: Medicare Other | Admitting: Family Medicine

## 2019-07-21 VITALS — BP 146/69 | HR 71 | Temp 96.7°F | Ht 63.5 in | Wt 138.6 lb

## 2019-07-21 DIAGNOSIS — K219 Gastro-esophageal reflux disease without esophagitis: Secondary | ICD-10-CM

## 2019-07-21 DIAGNOSIS — N1832 Chronic kidney disease, stage 3b: Secondary | ICD-10-CM

## 2019-07-21 DIAGNOSIS — Z8673 Personal history of transient ischemic attack (TIA), and cerebral infarction without residual deficits: Secondary | ICD-10-CM

## 2019-07-21 DIAGNOSIS — I6522 Occlusion and stenosis of left carotid artery: Secondary | ICD-10-CM

## 2019-07-21 DIAGNOSIS — E785 Hyperlipidemia, unspecified: Secondary | ICD-10-CM

## 2019-07-21 DIAGNOSIS — I10 Essential (primary) hypertension: Secondary | ICD-10-CM | POA: Diagnosis not present

## 2019-07-21 DIAGNOSIS — R600 Localized edema: Secondary | ICD-10-CM

## 2019-07-21 MED ORDER — CLOPIDOGREL BISULFATE 75 MG PO TABS: 75.0000 mg | ORAL_TABLET | Freq: Every day | ORAL | 1 refills | Status: DC

## 2019-07-21 MED ORDER — FUROSEMIDE 40 MG PO TABS: 40.0000 mg | ORAL_TABLET | Freq: Every day | ORAL | 1 refills | Status: DC

## 2019-07-21 MED ORDER — PRAVASTATIN SODIUM 80 MG PO TABS: 80.0000 mg | ORAL_TABLET | Freq: Every day | ORAL | 1 refills | Status: DC

## 2019-07-21 MED ORDER — PANTOPRAZOLE SODIUM 40 MG PO TBEC: DELAYED_RELEASE_TABLET | ORAL | 1 refills | Status: DC

## 2019-07-21 MED ORDER — AMLODIPINE BESYLATE 10 MG PO TABS: 10.0000 mg | ORAL_TABLET | Freq: Every day | ORAL | 1 refills | Status: DC

## 2019-07-21 MED ORDER — CARVEDILOL 25 MG PO TABS: 25.0000 mg | ORAL_TABLET | Freq: Two times a day (BID) | ORAL | 1 refills | Status: DC

## 2019-07-21 MED ORDER — FAMOTIDINE 20 MG PO TABS: 20.0000 mg | ORAL_TABLET | Freq: Every day | ORAL | 1 refills | Status: DC

## 2019-07-21 NOTE — Progress Notes (Signed)
Assessment & Plan:  1. Essential (primary) hypertension - Uncontrolled. Coreg increased from 12.5 to 25 mg BID.  - amLODipine (NORVASC) 10 MG tablet; Take 1 tablet (10 mg total) by mouth daily.  Dispense: 90 tablet; Refill: 1 - carvedilol (COREG) 25 MG tablet; Take 1 tablet (25 mg total) by mouth 2 (two) times daily.  Dispense: 180 tablet; Refill: 1  2. Occlusion and stenosis of left carotid artery - Well controlled on current regimen.  - clopidogrel (PLAVIX) 75 MG tablet; Take 1 tablet (75 mg total) by mouth daily.  Dispense: 90 tablet; Refill: 1  3. Hyperlipidemia, unspecified hyperlipidemia type - Well controlled on current regimen.  - pravastatin (PRAVACHOL) 80 MG tablet; Take 1 tablet (80 mg total) by mouth daily.  Dispense: 90 tablet; Refill: 1  4. History of CVA (cerebrovascular accident) - Well controlled on current regimen.  - clopidogrel (PLAVIX) 75 MG tablet; Take 1 tablet (75 mg total) by mouth daily.  Dispense: 90 tablet; Refill: 1  5. Lower extremity edema - Well controlled on current regimen.  - furosemide (LASIX) 40 MG tablet; Take 1 tablet (40 mg total) by mouth daily.  Dispense: 90 tablet; Refill: 1  6. Gastroesophageal reflux disease, unspecified whether esophagitis present - Well controlled on current regimen.  - famotidine (PEPCID) 20 MG tablet; Take 1 tablet (20 mg total) by mouth daily.  Dispense: 90 tablet; Refill: 1 - pantoprazole (PROTONIX) 40 MG tablet; TAKE 1 TABLET BY MOUTH ONCE DAILY BEFORE BREAKFAST  Dispense: 90 tablet; Refill: 1  7. Stage 3b chronic kidney disease - Managed by Dr. Theador Hawthorne. She has an appointment next week.    Return in about 3 months (around 10/21/2019) for follow-up of chronic medication conditions.  Hendricks Limes, MSN, APRN, FNP-C [facility]  Subjective:    Patient ID: Tamara King, female    DOB: 12-08-1940, 79 y.o.   MRN: [id number]  Patient Care Team: Loman Brooklyn, FNP as PCP - General  (Family Medicine) Satira Sark, MD as PCP - Cardiology (Cardiology) Liana Gerold, MD as Consulting Physician (Nephrology) Wellington Hampshire, MD as Consulting Physician (Cardiology)   Chief Complaint:  Chief Complaint  Patient presents with  . Hypertension    3 month follow up of chronic medical conditions    HPI: Tamara King is a 79 y.o. female presenting on 07/21/2019 for Hypertension (3 month follow up of chronic medical conditions)  Since our last visit patient has undergone balloon angioplasty of the left renal artery in-stent restenosis. Her velocities on doppler did improve but did not normalize. She will follow-up with Dr. Fletcher Anon in 6 months.   She has yearly dopplers with cardiology due to carotid artery disease and remains on dual antiplatelet therapy.   She is on high-dose pravastatin for hyperlipidemia.   Her blood pressure remains slightly elevated and it was suggested by cardiology that she increase the carvedilol to 25 mg twice daily but patient wanted to wait and monitor blood pressure before making the change. She reports today that when she checks her BP at home the systolic is always [id number].  Patient is followed by Dr. Theador Hawthorne for CKD.   New complaints: None  Social history:  Relevant past medical, surgical, family and social history reviewed and updated as indicated. Interim medical history since our last visit reviewed.  Allergies and medications reviewed and updated.  DATA REVIEWED: CHART IN EPIC  ROS: Negative unless specifically indicated above in HPI.    Current  Outpatient Medications:  .  acetaminophen (TYLENOL) 500 MG tablet, Take 500 mg by mouth every 6 (six) hours as needed for moderate pain., Disp: , Rfl:  .  albuterol (VENTOLIN HFA) 108 (90 Base) MCG/ACT inhaler, Inhale 2 puffs into the lungs every 6 (six) hours as needed for wheezing or shortness of breath., Disp: 18 g, Rfl: 0 .  amLODipine (NORVASC) 10 MG tablet, Take 1 tablet  by mouth once daily (Patient taking differently: Take 10 mg by mouth daily. ), Disp: 90 tablet, Rfl: 0 .  aspirin EC 81 MG tablet, Take 81 mg by mouth daily., Disp: , Rfl:  .  cholecalciferol (VITAMIN D3) 25 MCG (1000 UNIT) tablet, Take 1,000 Units by mouth daily., Disp: , Rfl:  .  clopidogrel (PLAVIX) 75 MG tablet, Take 1 tablet (75 mg total) by mouth daily., Disp: 90 tablet, Rfl: 1 .  EQ ALLERGY RELIEF, CETIRIZINE, 10 MG tablet, Take 1 tablet by mouth once daily, Disp: 90 tablet, Rfl: 0 .  famotidine (PEPCID) 20 MG tablet, Take 1 tablet by mouth once daily (Patient taking differently: Take 20 mg by mouth daily. ), Disp: 90 tablet, Rfl: 0 .  fluticasone (FLONASE) 50 MCG/ACT nasal spray, Place 2 sprays into the nose daily as needed for allergies (congestion.). , Disp: , Rfl:  .  furosemide (LASIX) 40 MG tablet, Take 1 tablet (40 mg total) by mouth daily., Disp: 90 tablet, Rfl: 1 .  hydrALAZINE (APRESOLINE) 10 MG tablet, Take 2 tablets (20 mg total) by mouth 3 (three) times daily., Disp: 120 tablet, Rfl: 6 .  ondansetron (ZOFRAN) 4 MG tablet, Take 1 tablet (4 mg total) by mouth every 8 (eight) hours as needed for nausea or vomiting., Disp: 20 tablet, Rfl: 0 .  pantoprazole (PROTONIX) 40 MG tablet, TAKE 1 TABLET BY MOUTH ONCE DAILY BEFORE BREAKFAST, Disp: 90 tablet, Rfl: 0 .  pravastatin (PRAVACHOL) 80 MG tablet, Take 1 tablet (80 mg total) by mouth daily., Disp: 90 tablet, Rfl: 1 .  Tetrahydrozoline HCl (VISINE OP), Place 2 drops into both eyes daily as needed (dry eyes)., Disp: , Rfl:  .  carvedilol (COREG) 12.5 MG tablet, Take 1 tablet (12.5 mg total) by mouth 2 (two) times daily., Disp: 180 tablet, Rfl: 3   Allergies  Allergen Reactions  . Losartan Anaphylaxis  . Sulfa Antibiotics Anaphylaxis and Swelling  . Codeine Nausea And Vomiting  . Fish Allergy Other (See Comments)    "sick as a dog"  . Other Other (See Comments)    Bologna - vomiting  . Spironolactone Other (See Comments)    Acute  kidney injury  . Vancomycin Other (See Comments)    "drives me crazy"  . Penicillins Rash    Has patient had a PCN reaction causing immediate rash, facial/tongue/throat swelling, SOB or lightheadedness with hypotension: Unknown Has patient had a PCN reaction causing severe rash involving mucus membranes or skin necrosis: Unknown Has patient had a PCN reaction that required hospitalization: No Has patient had a PCN reaction occurring within the last 10 years: No Childhood reaction. If all of the above answers are "NO", then may proceed with Cephalosporin use.    Past Medical History:  Diagnosis Date  . Breast cancer ([place])    Adenocarcinoma  . Chronic edema   . CKD (chronic kidney disease) stage 3, GFR 30-59 ml/min 10/22/2018  . Coronary atherosclerosis of native coronary artery    Nonobstructive at catheterization 2003, LVEF normal   . Essential hypertension   . GERD (  gastroesophageal reflux disease) 07/06/2013  . Idiopathic peripheral neuropathy 10/06/2013  . Mixed hyperlipidemia   . Occlusion and stenosis of left carotid artery 10/22/2017  . Renal artery stenosis ([facility])    BMS bilateally 2003, PTCA bilaterally 2005 with restenosis  . Sleep apnea    Stop Bang score of 4  . Stroke ([place])   . Symptomatic varicose veins, right 10/16/2018    Past Surgical History:  Procedure Laterality Date  . APPENDECTOMY    . CATARACT EXTRACTION W/PHACO  03/29/2011   Procedure: CATARACT EXTRACTION PHACO AND INTRAOCULAR LENS PLACEMENT (IOC);  Surgeon: Tonny Branch, MD;  Location: [facility];  Service: Ophthalmology;  Laterality: Right;  CDE:12.82  . CATARACT EXTRACTION W/PHACO  04/16/2011   Procedure: CATARACT EXTRACTION PHACO AND INTRAOCULAR LENS PLACEMENT (IOC);  Surgeon: Tonny Branch, MD;  Location: [facility];  Service: Ophthalmology;  Laterality: Left;  CDE: 11.54  . CESAREAN SECTION     x2  . CHOLECYSTECTOMY  2018  . HEMORRHOID SURGERY    . RENAL ANGIOGRAPHY N/A 05/20/2019   Procedure: RENAL ANGIOGRAPHY;   Surgeon: Wellington Hampshire, MD;  Location: [place] [facility];  Service: Cardiovascular;  Laterality: N/A;  . RENAL INTERVENTION  05/20/2019   Procedure: RENAL INTERVENTION;  Surgeon: Wellington Hampshire, MD;  Location: [place] [facility];  Service: Cardiovascular;;  . Right breast lumpectomy    . ROTATOR CUFF REPAIR  2008  . UPPER EXTREMITY ANGIOGRAPHY N/A 11/27/2017   Procedure: UPPER EXTREMITY ANGIOGRAPHY;  Surgeon: Marty Heck, MD;  Location: [place] [facility];  Service: Cardiovascular;  Laterality: N/A;    Social History   Socioeconomic History  . Marital status: Widowed    Spouse name: Not on file  . Number of children: 2  . Years of education: 12th  . Highest education level: 12th grade  Occupational History    Employer: RETIRED    Comment: n/a  Tobacco Use  . Smoking status: Former Smoker    Packs/day: 1.00    Years: 35.00    Pack years: 35.00    Types: Cigarettes    Quit date: 01/22/1998    Years since quitting: 21.5  . Smokeless tobacco: Never Used  . Tobacco comment: tobacco use - no  Vaping Use  . Vaping Use: Never used  Substance and Sexual Activity  . Alcohol use: No  . Drug use: No  . Sexual activity: Not on file  Other Topics Concern  . Not on file  Social History Narrative   Patient lives at home with family.  Two sons and two granddaughters live with her.   Caffeine use; 2-5 cups daily   Social Determinants of Health   Financial Resource Strain:   . Difficulty of Paying Living Expenses:   Food Insecurity:   . Worried About Charity fundraiser in the Last Year:   . Arboriculturist in the Last Year:   Transportation Needs:   . Film/video editor (Medical):   Marland Kitchen Lack of Transportation (Non-Medical):   Physical Activity:   . Days of Exercise per Week:   . Minutes of Exercise per Session:   Stress:   . Feeling of Stress :   Social Connections:   . Frequency of Communication with Friends and Family:   . Frequency of Social Gatherings with  Friends and Family:   . Attends Religious Services:   . Active Member of Clubs or Organizations:   . Attends Archivist Meetings:   Marland Kitchen Marital  Status:   Intimate Partner Violence:   . Fear of Current or Ex-Partner:   . Emotionally Abused:   Marland Kitchen Physically Abused:   . Sexually Abused:         Objective:    BP (!) 146/69   Pulse 71   Temp (!) 96.7 F (35.9 C) (Temporal)   Ht 5' 3.5" (1.613 m)   Wt 138 lb 9.6 oz (62.9 kg)   SpO2 97%   BMI 24.17 kg/m   Wt Readings from Last 3 Encounters:  07/21/19 138 lb 9.6 oz (62.9 kg)  06/16/19 139 lb (63 kg)  05/20/19 137 lb (62.1 kg)    Physical Exam Vitals reviewed.  Constitutional:      General: She is not in acute distress.    Appearance: Normal appearance. She is normal weight. She is not ill-appearing, toxic-appearing or diaphoretic.  HENT:     Head: Normocephalic and atraumatic.  Eyes:     General: No scleral icterus.       Right eye: No discharge.        Left eye: No discharge.     Conjunctiva/sclera: Conjunctivae normal.  Cardiovascular:     Rate and Rhythm: Normal rate and regular rhythm.     Heart sounds: Normal heart sounds. No murmur heard.  No friction rub. No gallop.   Pulmonary:     Effort: Pulmonary effort is normal. No respiratory distress.     Breath sounds: Normal breath sounds. No stridor. No wheezing, rhonchi or rales.  Musculoskeletal:        General: Normal range of motion.     Cervical back: Normal range of motion.     Right lower leg: Edema present.     Left lower leg: Edema present.  Skin:    General: Skin is warm and dry.     Capillary Refill: Capillary refill takes less than 2 seconds.  Neurological:     General: No focal deficit present.     Mental Status: She is alert and oriented to person, place, and time. Mental status is at baseline.  Psychiatric:        Mood and Affect: Mood normal.        Behavior: Behavior normal.        Thought Content: Thought content normal.         Judgment: Judgment normal.     No results found for: TSH Lab Results  Component Value Date   WBC 7.3 05/12/2019   HGB 14.0 05/12/2019   HCT 40.9 05/12/2019   MCV 87 05/12/2019   PLT 244 05/12/2019   Lab Results  Component Value Date   NA 138 05/12/2019   K 3.9 05/12/2019   CO2 23 05/12/2019   GLUCOSE 106 (H) 05/12/2019   BUN 18 05/12/2019   CREATININE 1.13 (H) 05/12/2019   BILITOT 0.6 10/22/2018   ALKPHOS 85 10/22/2018   AST 22 10/22/2018   ALT 13 10/22/2018   PROT 6.7 10/22/2018   ALBUMIN 4.5 10/22/2018   CALCIUM 10.1 05/12/2019   ANIONGAP 6 06/18/2016   Lab Results  Component Value Date   CHOL 177 04/13/2019   Lab Results  Component Value Date   HDL 85 04/13/2019   Lab Results  Component Value Date   LDLCALC 75 04/13/2019   Lab Results  Component Value Date   TRIG 99 04/13/2019   Lab Results  Component Value Date   CHOLHDL 2.1 04/13/2019   No results found for: HGBA1C

## 2019-07-21 NOTE — Patient Instructions (Signed)
Remember to come for lab work July 8th or 9th for Dr. Theador Hawthorne.

## 2019-07-29 ENCOUNTER — Other Ambulatory Visit: Payer: Medicare Other

## 2019-07-29 ENCOUNTER — Other Ambulatory Visit: Payer: Self-pay

## 2019-09-28 ENCOUNTER — Other Ambulatory Visit: Payer: Self-pay | Admitting: Cardiology

## 2019-09-28 DIAGNOSIS — I10 Essential (primary) hypertension: Secondary | ICD-10-CM

## 2019-09-30 ENCOUNTER — Other Ambulatory Visit: Payer: Medicare Other

## 2019-09-30 ENCOUNTER — Other Ambulatory Visit: Payer: Self-pay

## 2019-10-19 ENCOUNTER — Other Ambulatory Visit: Payer: Self-pay | Admitting: Family Medicine

## 2019-10-19 DIAGNOSIS — K219 Gastro-esophageal reflux disease without esophagitis: Secondary | ICD-10-CM

## 2019-10-19 DIAGNOSIS — J302 Other seasonal allergic rhinitis: Secondary | ICD-10-CM

## 2019-10-20 ENCOUNTER — Encounter: Payer: Self-pay | Admitting: Family Medicine

## 2019-10-20 ENCOUNTER — Other Ambulatory Visit: Payer: Self-pay

## 2019-10-20 ENCOUNTER — Ambulatory Visit (INDEPENDENT_AMBULATORY_CARE_PROVIDER_SITE_OTHER): Payer: Medicare Other | Admitting: Family Medicine

## 2019-10-20 VITALS — BP 135/67 | HR 66 | Temp 97.1°F | Ht 63.5 in | Wt 134.2 lb

## 2019-10-20 DIAGNOSIS — I10 Essential (primary) hypertension: Secondary | ICD-10-CM

## 2019-10-20 DIAGNOSIS — S61212A Laceration without foreign body of right middle finger without damage to nail, initial encounter: Secondary | ICD-10-CM

## 2019-10-20 DIAGNOSIS — I701 Atherosclerosis of renal artery: Secondary | ICD-10-CM

## 2019-10-20 DIAGNOSIS — K219 Gastro-esophageal reflux disease without esophagitis: Secondary | ICD-10-CM

## 2019-10-20 DIAGNOSIS — Z23 Encounter for immunization: Secondary | ICD-10-CM

## 2019-10-20 DIAGNOSIS — M159 Polyosteoarthritis, unspecified: Secondary | ICD-10-CM

## 2019-10-20 DIAGNOSIS — Z1231 Encounter for screening mammogram for malignant neoplasm of breast: Secondary | ICD-10-CM

## 2019-10-20 DIAGNOSIS — M150 Primary generalized (osteo)arthritis: Secondary | ICD-10-CM

## 2019-10-20 DIAGNOSIS — N1832 Chronic kidney disease, stage 3b: Secondary | ICD-10-CM

## 2019-10-20 DIAGNOSIS — M8949 Other hypertrophic osteoarthropathy, multiple sites: Secondary | ICD-10-CM | POA: Diagnosis not present

## 2019-10-20 DIAGNOSIS — E782 Mixed hyperlipidemia: Secondary | ICD-10-CM | POA: Diagnosis not present

## 2019-10-20 DIAGNOSIS — I6522 Occlusion and stenosis of left carotid artery: Secondary | ICD-10-CM

## 2019-10-20 NOTE — Progress Notes (Signed)
Assessment & Plan:  1. Essential (primary) hypertension - Well controlled on current regimen.   2. Gastroesophageal reflux disease, unspecified whether esophagitis present - Well controlled on current regimen.   3. Primary osteoarthritis involving multiple joints - Well controlled on current regimen.   4. Mixed hyperlipidemia - Well controlled on current regimen.   5. Stage 3b chronic kidney disease ([place]) - Managed by nephrology.  6. Renal artery stenosis ([place]) - Managed by cardiology.  7. Occlusion and stenosis of left carotid artery - Managed by cardiology.  8. Laceration of right middle finger without foreign body without damage to nail, initial encounter - Dermabond applied in office.  9. Need for immunization against influenza - Flu Vaccine QUAD High Dose(Fluad)   Return in about 3 months (around 01/19/2020) for follow-up of chronic medication conditions.  Hendricks Limes, MSN, APRN, FNP-C [facility]  Subjective:    Patient ID: Tamara King, female    DOB: 06/06/1940, 79 y.o.   MRN: [id number]  Patient Care Team: Loman Brooklyn, FNP as PCP - General (Family Medicine) Satira Sark, MD as PCP - Cardiology (Cardiology) Liana Gerold, MD as Consulting Physician (Nephrology) Wellington Hampshire, MD as Consulting Physician (Cardiology)   Chief Complaint:  Chief Complaint  Patient presents with  . Hypertension    check up of chronic medical conditions  . Laceration    Right middle finger     HPI: Tamara King is a 79 y.o. female presenting on 10/20/2019 for Hypertension (check up of chronic medical conditions) and Laceration (Right middle finger )  CKD is managed by Dr. Theador Hawthorne who she last saw on 10/07/2019.   Dr. Fletcher Anon manages renal artery stenosis and carotid artery disease.   Patient has no complaints regarding routine medical issues.  New complaints: She does however have a laceration to her right middle finger  she sustained this morning when she hit it on the door.    Social history:  Relevant past medical, surgical, family and social history reviewed and updated as indicated. Interim medical history since our last visit reviewed.  Allergies and medications reviewed and updated.  DATA REVIEWED: CHART IN EPIC  ROS: Negative unless specifically indicated above in HPI.    Current Outpatient Medications:  .  acetaminophen (TYLENOL) 500 MG tablet, Take 500 mg by mouth every 6 (six) hours as needed for moderate pain., Disp: , Rfl:  .  albuterol (VENTOLIN HFA) 108 (90 Base) MCG/ACT inhaler, Inhale 2 puffs into the lungs every 6 (six) hours as needed for wheezing or shortness of breath., Disp: 18 g, Rfl: 0 .  amLODipine (NORVASC) 10 MG tablet, Take 1 tablet (10 mg total) by mouth daily., Disp: 90 tablet, Rfl: 1 .  aspirin EC 81 MG tablet, Take 81 mg by mouth daily., Disp: , Rfl:  .  carvedilol (COREG) 25 MG tablet, Take 1 tablet (25 mg total) by mouth 2 (two) times daily., Disp: 180 tablet, Rfl: 1 .  cholecalciferol (VITAMIN D3) 25 MCG (1000 UNIT) tablet, Take 1,000 Units by mouth daily., Disp: , Rfl:  .  clopidogrel (PLAVIX) 75 MG tablet, Take 1 tablet (75 mg total) by mouth daily., Disp: 90 tablet, Rfl: 1 .  EQ ALLERGY RELIEF, CETIRIZINE, 10 MG tablet, Take 1 tablet by mouth once daily, Disp: 90 tablet, Rfl: 0 .  famotidine (PEPCID) 20 MG tablet, Take 1 tablet (20 mg total) by mouth daily., Disp: 90 tablet, Rfl: 1 .  fluticasone (FLONASE) 50 MCG/ACT  nasal spray, Place 2 sprays into the nose daily as needed for allergies (congestion.). , Disp: , Rfl:  .  furosemide (LASIX) 40 MG tablet, Take 1 tablet (40 mg total) by mouth daily., Disp: 90 tablet, Rfl: 1 .  hydrALAZINE (APRESOLINE) 10 MG tablet, TAKE 2 TABLETS BY MOUTH THREE TIMES DAILY, Disp: 120 tablet, Rfl: 0 .  ondansetron (ZOFRAN) 4 MG tablet, Take 1 tablet (4 mg total) by mouth every 8 (eight) hours as needed for nausea or vomiting., Disp: 20  tablet, Rfl: 0 .  pantoprazole (PROTONIX) 40 MG tablet, TAKE 1 TABLET BY MOUTH ONCE DAILY BEFORE BREAKFAST, Disp: 90 tablet, Rfl: 0 .  pravastatin (PRAVACHOL) 80 MG tablet, Take 1 tablet (80 mg total) by mouth daily., Disp: 90 tablet, Rfl: 1 .  Tetrahydrozoline HCl (VISINE OP), Place 2 drops into both eyes daily as needed (dry eyes)., Disp: , Rfl:    Allergies  Allergen Reactions  . Losartan Anaphylaxis  . Sulfa Antibiotics Anaphylaxis and Swelling  . Codeine Nausea And Vomiting  . Fish Allergy Other (See Comments)    "sick as a dog"  . Other Other (See Comments)    Bologna - vomiting  . Spironolactone Other (See Comments)    Acute kidney injury  . Vancomycin Other (See Comments)    "drives me crazy"  . Penicillins Rash    Has patient had a PCN reaction causing immediate rash, facial/tongue/throat swelling, SOB or lightheadedness with hypotension: Unknown Has patient had a PCN reaction causing severe rash involving mucus membranes or skin necrosis: Unknown Has patient had a PCN reaction that required hospitalization: No Has patient had a PCN reaction occurring within the last 10 years: No Childhood reaction. If all of the above answers are "NO", then may proceed with Cephalosporin use.    Past Medical History:  Diagnosis Date  . Breast cancer ([place])    Adenocarcinoma  . Chronic edema   . CKD (chronic kidney disease) stage 3, GFR 30-59 ml/min 10/22/2018  . Coronary atherosclerosis of native coronary artery    Nonobstructive at catheterization 2003, LVEF normal   . Essential hypertension   . GERD (gastroesophageal reflux disease) 07/06/2013  . Idiopathic peripheral neuropathy 10/06/2013  . Mixed hyperlipidemia   . Occlusion and stenosis of left carotid artery 10/22/2017  . Renal artery stenosis ([facility])    BMS bilateally 2003, PTCA bilaterally 2005 with restenosis  . Sleep apnea    Stop Bang score of 4  . Stroke ([place])   . Symptomatic varicose veins, right 10/16/2018    Past  Surgical History:  Procedure Laterality Date  . APPENDECTOMY    . CATARACT EXTRACTION W/PHACO  03/29/2011   Procedure: CATARACT EXTRACTION PHACO AND INTRAOCULAR LENS PLACEMENT (IOC);  Surgeon: Tonny Branch, MD;  Location: [facility];  Service: Ophthalmology;  Laterality: Right;  CDE:12.82  . CATARACT EXTRACTION W/PHACO  04/16/2011   Procedure: CATARACT EXTRACTION PHACO AND INTRAOCULAR LENS PLACEMENT (IOC);  Surgeon: Tonny Branch, MD;  Location: [facility];  Service: Ophthalmology;  Laterality: Left;  CDE: 11.54  . CESAREAN SECTION     x2  . CHOLECYSTECTOMY  2018  . HEMORRHOID SURGERY    . RENAL ANGIOGRAPHY N/A 05/20/2019   Procedure: RENAL ANGIOGRAPHY;  Surgeon: Wellington Hampshire, MD;  Location: [place] [facility];  Service: Cardiovascular;  Laterality: N/A;  . RENAL INTERVENTION  05/20/2019   Procedure: RENAL INTERVENTION;  Surgeon: Wellington Hampshire, MD;  Location: [place] [facility];  Service: Cardiovascular;;  . Right breast lumpectomy    .  ROTATOR CUFF REPAIR  2008  . UPPER EXTREMITY ANGIOGRAPHY N/A 11/27/2017   Procedure: UPPER EXTREMITY ANGIOGRAPHY;  Surgeon: Marty Heck, MD;  Location: [place] [facility];  Service: Cardiovascular;  Laterality: N/A;    Social History   Socioeconomic History  . Marital status: Widowed    Spouse name: Not on file  . Number of children: 2  . Years of education: 12th  . Highest education level: 12th grade  Occupational History    Employer: RETIRED    Comment: n/a  Tobacco Use  . Smoking status: Former Smoker    Packs/day: 1.00    Years: 35.00    Pack years: 35.00    Types: Cigarettes    Quit date: 01/22/1998    Years since quitting: 21.7  . Smokeless tobacco: Never Used  . Tobacco comment: tobacco use - no  Vaping Use  . Vaping Use: Never used  Substance and Sexual Activity  . Alcohol use: No  . Drug use: No  . Sexual activity: Not on file  Other Topics Concern  . Not on file  Social History Narrative   Patient lives at home with family.   Two sons and two granddaughters live with her.   Caffeine use; 2-5 cups daily   Social Determinants of Health   Financial Resource Strain:   . Difficulty of Paying Living Expenses: Not on file  Food Insecurity:   . Worried About Charity fundraiser in the Last Year: Not on file  . Ran Out of Food in the Last Year: Not on file  Transportation Needs:   . Lack of Transportation (Medical): Not on file  . Lack of Transportation (Non-Medical): Not on file  Physical Activity:   . Days of Exercise per Week: Not on file  . Minutes of Exercise per Session: Not on file  Stress:   . Feeling of Stress : Not on file  Social Connections:   . Frequency of Communication with Friends and Family: Not on file  . Frequency of Social Gatherings with Friends and Family: Not on file  . Attends Religious Services: Not on file  . Active Member of Clubs or Organizations: Not on file  . Attends Archivist Meetings: Not on file  . Marital Status: Not on file  Intimate Partner Violence:   . Fear of Current or Ex-Partner: Not on file  . Emotionally Abused: Not on file  . Physically Abused: Not on file  . Sexually Abused: Not on file        Objective:    BP 135/67   Pulse 66   Temp (!) 97.1 F (36.2 C) (Temporal)   Ht 5' 3.5" (1.613 m)   Wt 134 lb 3.2 oz (60.9 kg)   SpO2 95%   BMI 23.40 kg/m   Wt Readings from Last 3 Encounters:  10/20/19 134 lb 3.2 oz (60.9 kg)  07/21/19 138 lb 9.6 oz (62.9 kg)  06/16/19 139 lb (63 kg)    Physical Exam Vitals reviewed.  Constitutional:      General: She is not in acute distress.    Appearance: Normal appearance. She is normal weight. She is not ill-appearing, toxic-appearing or diaphoretic.  HENT:     Head: Normocephalic and atraumatic.  Eyes:     General: No scleral icterus.       Right eye: No discharge.        Left eye: No discharge.     Conjunctiva/sclera: Conjunctivae normal.  Cardiovascular:  Rate and Rhythm: Normal rate and  regular rhythm.     Heart sounds: Normal heart sounds. No murmur heard.  No friction rub. No gallop.   Pulmonary:     Effort: Pulmonary effort is normal. No respiratory distress.     Breath sounds: Normal breath sounds. No stridor. No wheezing, rhonchi or rales.  Musculoskeletal:        General: Normal range of motion.     Cervical back: Normal range of motion.     Right lower leg: Edema present.     Left lower leg: Edema present.  Skin:    General: Skin is warm and dry.     Capillary Refill: Capillary refill takes less than 2 seconds.     Findings: Laceration (right middle finger - approximated) present.  Neurological:     General: No focal deficit present.     Mental Status: She is alert and oriented to person, place, and time. Mental status is at baseline.  Psychiatric:        Mood and Affect: Mood normal.        Behavior: Behavior normal.        Thought Content: Thought content normal.        Judgment: Judgment normal.   Lacerations cleansed with normal saline, patted dry, and Dermabond was applied. Patient tolerated well.    No results found for: TSH Lab Results  Component Value Date   WBC 7.3 05/12/2019   HGB 14.0 05/12/2019   HCT 40.9 05/12/2019   MCV 87 05/12/2019   PLT 244 05/12/2019   Lab Results  Component Value Date   NA 138 05/12/2019   K 3.9 05/12/2019   CO2 23 05/12/2019   GLUCOSE 106 (H) 05/12/2019   BUN 18 05/12/2019   CREATININE 1.13 (H) 05/12/2019   BILITOT 0.6 10/22/2018   ALKPHOS 85 10/22/2018   AST 22 10/22/2018   ALT 13 10/22/2018   PROT 6.7 10/22/2018   ALBUMIN 4.5 10/22/2018   CALCIUM 10.1 05/12/2019   ANIONGAP 6 06/18/2016   Lab Results  Component Value Date   CHOL 177 04/13/2019   Lab Results  Component Value Date   HDL 85 04/13/2019   Lab Results  Component Value Date   LDLCALC 75 04/13/2019   Lab Results  Component Value Date   TRIG 99 04/13/2019   Lab Results  Component Value Date   CHOLHDL 2.1 04/13/2019   No  results found for: HGBA1C

## 2019-10-26 ENCOUNTER — Encounter: Payer: Self-pay | Admitting: Family Medicine

## 2019-10-28 ENCOUNTER — Other Ambulatory Visit: Payer: Self-pay | Admitting: Cardiology

## 2019-10-28 DIAGNOSIS — I10 Essential (primary) hypertension: Secondary | ICD-10-CM

## 2019-11-27 ENCOUNTER — Other Ambulatory Visit: Payer: Self-pay | Admitting: Family Medicine

## 2019-11-27 DIAGNOSIS — R110 Nausea: Secondary | ICD-10-CM

## 2019-12-03 ENCOUNTER — Other Ambulatory Visit: Payer: Self-pay

## 2019-12-03 ENCOUNTER — Ambulatory Visit (HOSPITAL_COMMUNITY)
Admission: RE | Admit: 2019-12-03 | Discharge: 2019-12-03 | Disposition: A | Discharge status: Home / Self Care | Payer: Medicare Other | Source: Ambulatory Visit | Attending: Cardiology | Admitting: Cardiology

## 2019-12-03 ENCOUNTER — Other Ambulatory Visit (HOSPITAL_COMMUNITY): Payer: Self-pay | Admitting: Cardiovascular Disease

## 2019-12-03 DIAGNOSIS — Z9889 Other specified postprocedural states: Secondary | ICD-10-CM | POA: Insufficient documentation

## 2019-12-03 DIAGNOSIS — Z48812 Encounter for surgical aftercare following surgery on the circulatory system: Secondary | ICD-10-CM | POA: Diagnosis not present

## 2019-12-03 DIAGNOSIS — I701 Atherosclerosis of renal artery: Secondary | ICD-10-CM

## 2019-12-04 ENCOUNTER — Other Ambulatory Visit: Payer: Self-pay

## 2019-12-04 ENCOUNTER — Other Ambulatory Visit: Payer: Medicare Other

## 2019-12-04 ENCOUNTER — Ambulatory Visit (INDEPENDENT_AMBULATORY_CARE_PROVIDER_SITE_OTHER): Payer: Medicare Other

## 2019-12-04 DIAGNOSIS — Z780 Asymptomatic menopausal state: Secondary | ICD-10-CM

## 2019-12-08 ENCOUNTER — Telehealth: Payer: Self-pay

## 2019-12-08 NOTE — Telephone Encounter (Signed)
Pt has been scheduled with Almyra Free.

## 2019-12-14 ENCOUNTER — Other Ambulatory Visit: Payer: Self-pay

## 2019-12-14 ENCOUNTER — Ambulatory Visit (INDEPENDENT_AMBULATORY_CARE_PROVIDER_SITE_OTHER): Payer: Medicare Other | Admitting: Pharmacist

## 2019-12-14 ENCOUNTER — Other Ambulatory Visit: Payer: Self-pay | Admitting: Cardiology

## 2019-12-14 DIAGNOSIS — M810 Age-related osteoporosis without current pathological fracture: Secondary | ICD-10-CM | POA: Diagnosis not present

## 2019-12-14 DIAGNOSIS — I10 Essential (primary) hypertension: Secondary | ICD-10-CM

## 2019-12-14 MED ORDER — ALENDRONATE SODIUM 70 MG PO TABS: 70.0000 mg | ORAL_TABLET | ORAL | 5 refills | Status: DC

## 2019-12-14 NOTE — Progress Notes (Signed)
° ° °  12/14/2019 Name: Tamara King MRN: [id number] DOB: [birth year]   S:   81 yoF Presents for OSTEOPOROSIS education, and management  Current Height:  5'3.5"      Max Lifetime Height: 5'3.5" Current Weight:  134 LBS       Ethnicity:Caucasian  BP:   135/67     HPI: Does pt already have a diagnosis of:  Osteopenia?  Yes Osteoporosis? NEW ONSET   Back Pain?  Yes       Kyphosis?  No Prior fracture?  No Med(s) for Osteoporosis/Osteopenia:  Vit D Med(s) previously tried for Osteoporosis/Osteopenia:  n/a                                                             PMH: Age at menopause:  19s Hysterectomy?  No Oophorectomy?  No HRT? No Steroid Use?  No Thyroid med?  No History of cancer?  Yes breast cancer Dad-lung cancer History of digestive disorders (ie Crohn's)?  Yes,GERD on medication Current or previous eating disorders?  No Last Vitamin D Result:  n/a Last GFR Result:  51   FH/SH: Family history of osteoporosis?  No Parent with history of hip fracture?  No Family history of breast cancer?  Yes patient  Exercise?  No Smoking?  No Alcohol?  No    Calcium Assessment Calcium Intake  # of servings/day  Calcium mg  Milk (8 oz) 0  x  300  = 0  Yogurt (4 oz) 0 x  200 = 0  Cheese (1 oz) 1 x  200 = 200  Other Calcium sources   250mg   Ca supplement 0 = 0   Estimated calcium intake per day 450    DEXA Results 2021 FINDINGS: AP LUMBAR SPINE (L1-L4) Bone Mineral Density (BMD):  0.937 g/cm2 Young Adult T-Score:  -2.1 Z-Score:  -0.2  RIGHT FEMUR NECK Bone Mineral Density (BMD):  0.682 g/cm2 Young Adult T-Score: -2.6 Z-Score:  -0.5  FRAX 10 year estimate: Not calculated because patient has osteoporosis  FRACTURE RISK INCREASED  Assessment: Patient's diagnostic category is OSTEOPOROSIS by WHO Criteria.  Recommendations: 1.  Start  alendronate (FOSAMAX) 70MG  WEEKLY. GFR 47  COUNSELED PATIENT ON HOW TO TAKE MEDICATION PROPERLY 2.  recommend calcium 1200mg   daily through supplementation or diet.  3.  recommend weight bearing exercise - 30 minutes at least 4 days per week.   4.  Counseled and educated about fall risk and prevention. 5. VOLTAREN GEL GIVEN FOR OSTEOARTHRITIS IN HANDS  Recheck DEXA:  2 years  Time spent counseling patient:  25 minutes

## 2020-01-04 ENCOUNTER — Encounter: Payer: Self-pay | Admitting: *Deleted

## 2020-01-11 ENCOUNTER — Other Ambulatory Visit: Payer: Self-pay | Admitting: Family Medicine

## 2020-01-11 DIAGNOSIS — K219 Gastro-esophageal reflux disease without esophagitis: Secondary | ICD-10-CM

## 2020-01-11 DIAGNOSIS — J302 Other seasonal allergic rhinitis: Secondary | ICD-10-CM

## 2020-01-25 ENCOUNTER — Other Ambulatory Visit: Payer: Self-pay | Admitting: Family Medicine

## 2020-01-25 DIAGNOSIS — Z8673 Personal history of transient ischemic attack (TIA), and cerebral infarction without residual deficits: Secondary | ICD-10-CM

## 2020-01-25 DIAGNOSIS — I10 Essential (primary) hypertension: Secondary | ICD-10-CM

## 2020-01-25 DIAGNOSIS — I6522 Occlusion and stenosis of left carotid artery: Secondary | ICD-10-CM

## 2020-01-26 ENCOUNTER — Ambulatory Visit: Payer: Medicare Other | Admitting: Family Medicine

## 2020-02-11 ENCOUNTER — Encounter: Payer: Self-pay | Admitting: Family Medicine

## 2020-02-11 ENCOUNTER — Ambulatory Visit (INDEPENDENT_AMBULATORY_CARE_PROVIDER_SITE_OTHER): Payer: Medicare Other | Admitting: Family Medicine

## 2020-02-11 ENCOUNTER — Other Ambulatory Visit: Payer: Self-pay

## 2020-02-11 VITALS — BP 145/70 | HR 65 | Temp 97.2°F | Ht 63.5 in | Wt 132.4 lb

## 2020-02-11 DIAGNOSIS — I701 Atherosclerosis of renal artery: Secondary | ICD-10-CM | POA: Diagnosis not present

## 2020-02-11 DIAGNOSIS — N1832 Chronic kidney disease, stage 3b: Secondary | ICD-10-CM

## 2020-02-11 DIAGNOSIS — E785 Hyperlipidemia, unspecified: Secondary | ICD-10-CM

## 2020-02-11 DIAGNOSIS — I6522 Occlusion and stenosis of left carotid artery: Secondary | ICD-10-CM | POA: Diagnosis not present

## 2020-02-11 DIAGNOSIS — M8949 Other hypertrophic osteoarthropathy, multiple sites: Secondary | ICD-10-CM

## 2020-02-11 DIAGNOSIS — R5383 Other fatigue: Secondary | ICD-10-CM

## 2020-02-11 DIAGNOSIS — E559 Vitamin D deficiency, unspecified: Secondary | ICD-10-CM

## 2020-02-11 DIAGNOSIS — I10 Essential (primary) hypertension: Secondary | ICD-10-CM

## 2020-02-11 DIAGNOSIS — R600 Localized edema: Secondary | ICD-10-CM

## 2020-02-11 DIAGNOSIS — M810 Age-related osteoporosis without current pathological fracture: Secondary | ICD-10-CM

## 2020-02-11 DIAGNOSIS — M150 Primary generalized (osteo)arthritis: Secondary | ICD-10-CM

## 2020-02-11 DIAGNOSIS — M159 Polyosteoarthritis, unspecified: Secondary | ICD-10-CM

## 2020-02-11 DIAGNOSIS — Z8673 Personal history of transient ischemic attack (TIA), and cerebral infarction without residual deficits: Secondary | ICD-10-CM

## 2020-02-11 DIAGNOSIS — K219 Gastro-esophageal reflux disease without esophagitis: Secondary | ICD-10-CM

## 2020-02-11 MED ORDER — HYDRALAZINE HCL 10 MG PO TABS: 20.0000 mg | ORAL_TABLET | Freq: Three times a day (TID) | ORAL | 1 refills | Status: DC

## 2020-02-11 MED ORDER — CLOPIDOGREL BISULFATE 75 MG PO TABS: 75.0000 mg | ORAL_TABLET | Freq: Every day | ORAL | 1 refills | Status: DC

## 2020-02-11 MED ORDER — PANTOPRAZOLE SODIUM 40 MG PO TBEC: DELAYED_RELEASE_TABLET | ORAL | 1 refills | Status: DC

## 2020-02-11 MED ORDER — FAMOTIDINE 20 MG PO TABS: 20.0000 mg | ORAL_TABLET | Freq: Every day | ORAL | 1 refills | Status: DC

## 2020-02-11 MED ORDER — AMLODIPINE BESYLATE 10 MG PO TABS: 10.0000 mg | ORAL_TABLET | Freq: Every day | ORAL | 1 refills | Status: DC

## 2020-02-11 MED ORDER — PRAVASTATIN SODIUM 80 MG PO TABS
80.0000 mg | ORAL_TABLET | Freq: Every day | ORAL | 1 refills | Status: DC
Start: 2020-02-11 — End: 2020-08-11

## 2020-02-11 MED ORDER — CARVEDILOL 25 MG PO TABS: 25.0000 mg | ORAL_TABLET | Freq: Two times a day (BID) | ORAL | 1 refills | Status: DC

## 2020-02-11 MED ORDER — FUROSEMIDE 40 MG PO TABS
40.0000 mg | ORAL_TABLET | Freq: Every day | ORAL | 1 refills | Status: DC
Start: 2020-02-11 — End: 2020-08-11

## 2020-02-11 NOTE — Progress Notes (Signed)
Assessment & Plan:  1. Renal artery stenosis (Tamara King) - Managed by Dr. Fletcher King.   2. Occlusion and stenosis of left carotid artery - Patient has had stent placed. - clopidogrel (PLAVIX) 75 MG tablet; Take 1 tablet (75 mg total) by mouth daily.  Dispense: 90 tablet; Refill: 1 - Lipid panel; Future  3. Essential (primary) hypertension - Much improved.  Hydralazine refilled. - amLODipine (NORVASC) 10 MG tablet; Take 1 tablet (10 mg total) by mouth daily.  Dispense: 90 tablet; Refill: 1 - carvedilol (COREG) 25 MG tablet; Take 1 tablet (25 mg total) by mouth 2 (two) times daily.  Dispense: 180 tablet; Refill: 1 - CMP14+EGFR; Future - Lipid panel; Future - Thyroid Panel With TSH; Future - hydrALAZINE (APRESOLINE) 10 MG tablet; Take 2 tablets (20 mg total) by mouth 3 (three) times daily.  Dispense: 540 tablet; Refill: 1  4. Hyperlipidemia, unspecified hyperlipidemia type - Labs to assess. - pravastatin (PRAVACHOL) 80 MG tablet; Take 1 tablet (80 mg total) by mouth daily.  Dispense: 90 tablet; Refill: 1 - CMP14+EGFR; Future - Lipid panel; Future  5. Lower extremity edema - Improved with increased dose of Lasix. - furosemide (LASIX) 40 MG tablet; Take 1 tablet (40 mg total) by mouth daily.  Dispense: 90 tablet; Refill: 1 - furosemide (LASIX) 20 MG tablet; Take 20 mg by mouth every evening. Takes in addition to 40 mg in the AM. - CMP14+EGFR; Future  6. History of CVA (cerebrovascular accident) - Continue clopidogrel and pravastatin. - clopidogrel (PLAVIX) 75 MG tablet; Take 1 tablet (75 mg total) by mouth daily.  Dispense: 90 tablet; Refill: 1 - pravastatin (PRAVACHOL) 80 MG tablet; Take 1 tablet (80 mg total) by mouth daily.  Dispense: 90 tablet; Refill: 1 - CMP14+EGFR; Future - Lipid panel; Future  7. Gastroesophageal reflux disease, unspecified whether esophagitis present - Well controlled on current regimen.  - pantoprazole (PROTONIX) 40 MG tablet; 1 tablet daily  Dispense: 90 tablet;  Refill: 1 - famotidine (PEPCID) 20 MG tablet; Take 1 tablet (20 mg total) by mouth daily.  Dispense: 90 tablet; Refill: 1 - CMP14+EGFR; Future  8. Vitamin D deficiency - Labs to assess. - VITAMIN D 25 Hydroxy (Vit-D Deficiency, Fractures); Future  9. Age-related osteoporosis without current pathological fracture - Continue Fosamax. - CMP14+EGFR; Future  10. Primary osteoarthritis involving multiple joints - Tylenol as needed for pain.  Patient does not want anything stronger. - CMP14+EGFR; Future  11. Stage 3b chronic kidney disease (Tamara King) - Managed by Dr. Theador King, nephrologist. - CMP14+EGFR; Future  12. Tired - Discussed she needs to get better sleep and this is not going to happen if she has people waking her up in the middle of the night.   Patient is going to return for lab work when she is due for labs for the nephrologist so that she only has to be stuck once.   Return in about 3 months (around 05/11/2020) for follow-up of chronic medication conditions.  Tamara Limes, MSN, APRN, FNP-C [facility]  Subjective:    Patient ID: Tamara King, female    DOB: 02/23/1940, 80 y.o.   MRN: [id number]  Patient Care Team: Tamara Brooklyn, FNP as PCP - General (Family Medicine) Tamara Sark, MD as PCP - Cardiology (Cardiology) Tamara Gerold, MD as Consulting Physician (Nephrology) Tamara Hampshire, MD as Consulting Physician (Cardiology)   Chief Complaint:  Chief Complaint  Patient presents with  . Hypertension    4 month follow up  of chronic medical conditions   . Fatigue    Patient states this has been ongoing     HPI: Tamara King is a 80 y.o. female presenting on 02/11/2020 for Hypertension (4 month follow up of chronic medical conditions/) and Fatigue (Patient states this has been ongoing/)  Renal artery stenosis: Managed by Dr. Fletcher King.  Patient had a balloon angioplasty of the left renal artery in-stent restenosis.  Velocities  on Doppler improved but did not normalize.    The plan is to repeat Doppler in 6 months and continue current medications.   Hypertension: Patient is out of hydralazine and has been unable to get a refill, otherwise she has continued medications as prescribed.  Hyperlipidemia: Patient is taking pravastatin nightly.  Lower extremity edema: Nephrologist increased furosemide so that she could take an additional 20 mg in the evening which she has been doing.  History of CVA: Patient has continued with clopidogrel.  GERD: Patient is taking both Protonix and famotidine.  Vitamin D deficiency: Patient was diagnosed with vitamin D deficiency by her nephrologist last year.  She is on a supplement.  Osteoporosis: Patient is taking Fosamax.  Osteoarthritis: Patient only takes Tylenol for pain and does not wish to have anything stronger.  Unable to take NSAIDs.  CKD: Managed by nephrologist who she is scheduled to see again next month.  New complaints: Patient reports that she feels tired a lot of the time and cannot do much such as cooking because it hurts.  She is tired because she is not sleeping well.  States that she sometimes only gets 2 to 3 hours of sleep at a time because her granddaughters that live with her wake her up in the middle of the night because they are still awake.  Her pain is in her right shoulder and her back.  She does take Tylenol for pain if necessary but tries not to.  She does not want anything stronger for pain.   Social history:  Relevant past medical, surgical, family and social history reviewed and updated as indicated. Interim medical history since our last visit reviewed.  Allergies and medications reviewed and updated.  DATA REVIEWED: CHART IN EPIC  ROS: Negative unless specifically indicated above in HPI.    Current Outpatient Medications:  .  acetaminophen (TYLENOL) 500 MG tablet, Take 500 mg by mouth every 6 (six) hours as needed for moderate pain., Disp:  , Rfl:  .  albuterol (VENTOLIN HFA) 108 (90 Base) MCG/ACT inhaler, Inhale 2 puffs into the lungs every 6 (six) hours as needed for wheezing or shortness of breath., Disp: 18 g, Rfl: 0 .  alendronate (FOSAMAX) 70 MG tablet, Take 1 tablet (70 mg total) by mouth every 7 (seven) days. Take with a full glass of water on an empty stomach., Disp: 4 tablet, Rfl: 5 .  aspirin EC 81 MG tablet, Take 81 mg by mouth daily., Disp: , Rfl:  .  cholecalciferol (VITAMIN D3) 25 MCG (1000 UNIT) tablet, Take 1,000 Units by mouth daily., Disp: , Rfl:  .  EQ ALLERGY RELIEF, CETIRIZINE, 10 MG tablet, Take 1 tablet by mouth once daily, Disp: 90 tablet, Rfl: 0 .  fluticasone (FLONASE) 50 MCG/ACT nasal spray, Place 2 sprays into the nose daily as needed for allergies (congestion.). , Disp: , Rfl:  .  furosemide (LASIX) 20 MG tablet, Take 20 mg by mouth every evening. Takes in addition to 40 mg in the AM., Disp: , Rfl:  .  ondansetron (ZOFRAN)  4 MG tablet, TAKE 1 TABLET BY MOUTH EVERY 8 HOURS AS NEEDED FOR NAUSEA OR VOMITING, Disp: 20 tablet, Rfl: 0 .  Tetrahydrozoline HCl (VISINE OP), Place 2 drops into both eyes daily as needed (dry eyes)., Disp: , Rfl:  .  amLODipine (NORVASC) 10 MG tablet, Take 1 tablet (10 mg total) by mouth daily., Disp: 90 tablet, Rfl: 1 .  carvedilol (COREG) 25 MG tablet, Take 1 tablet (25 mg total) by mouth 2 (two) times daily., Disp: 180 tablet, Rfl: 1 .  clopidogrel (PLAVIX) 75 MG tablet, Take 1 tablet (75 mg total) by mouth daily., Disp: 90 tablet, Rfl: 1 .  famotidine (PEPCID) 20 MG tablet, Take 1 tablet (20 mg total) by mouth daily., Disp: 90 tablet, Rfl: 1 .  furosemide (LASIX) 40 MG tablet, Take 1 tablet (40 mg total) by mouth daily., Disp: 90 tablet, Rfl: 1 .  hydrALAZINE (APRESOLINE) 10 MG tablet, Take 2 tablets (20 mg total) by mouth 3 (three) times daily., Disp: 540 tablet, Rfl: 1 .  pantoprazole (PROTONIX) 40 MG tablet, 1 tablet daily, Disp: 90 tablet, Rfl: 1 .  pravastatin (PRAVACHOL) 80  MG tablet, Take 1 tablet (80 mg total) by mouth daily., Disp: 90 tablet, Rfl: 1   Allergies  Allergen Reactions  . Losartan Anaphylaxis  . Sulfa Antibiotics Anaphylaxis and Swelling  . Codeine Nausea And Vomiting  . Fish Allergy Other (See Comments)    "sick as a dog"  . Other Other (See Comments)    Bologna - vomiting  . Spironolactone Other (See Comments)    Acute kidney injury  . Vancomycin Other (See Comments)    "drives me crazy"  . Penicillins Rash    Has patient had a PCN reaction causing immediate rash, facial/tongue/throat swelling, SOB or lightheadedness with hypotension: Unknown Has patient had a PCN reaction causing severe rash involving mucus membranes or skin necrosis: Unknown Has patient had a PCN reaction that required hospitalization: No Has patient had a PCN reaction occurring within the last 10 years: No Childhood reaction. If all of the above answers are "NO", then may proceed with Cephalosporin use.    Past Medical History:  Diagnosis Date  . Breast cancer ([place])    Adenocarcinoma  . Chronic edema   . CKD (chronic kidney disease) stage 3, GFR 30-59 ml/min ([place]) 10/22/2018  . Coronary atherosclerosis of native coronary artery    Nonobstructive at catheterization 2003, LVEF normal   . Essential hypertension   . GERD (gastroesophageal reflux disease) 07/06/2013  . Idiopathic peripheral neuropathy 10/06/2013  . Mixed hyperlipidemia   . Occlusion and stenosis of left carotid artery 10/22/2017  . Renal artery stenosis ([facility])    BMS bilateally 2003, PTCA bilaterally 2005 with restenosis  . Sleep apnea    Stop Bang score of 4  . Stroke ([place])   . Symptomatic varicose veins, right 10/16/2018    Past Surgical History:  Procedure Laterality Date  . APPENDECTOMY    . CATARACT EXTRACTION W/PHACO  03/29/2011   Procedure: CATARACT EXTRACTION PHACO AND INTRAOCULAR LENS PLACEMENT (IOC);  Surgeon: Tonny Branch, MD;  Location: [facility];  Service: Ophthalmology;  Laterality: Right;   CDE:12.82  . CATARACT EXTRACTION W/PHACO  04/16/2011   Procedure: CATARACT EXTRACTION PHACO AND INTRAOCULAR LENS PLACEMENT (IOC);  Surgeon: Tonny Branch, MD;  Location: [facility];  Service: Ophthalmology;  Laterality: Left;  CDE: 11.54  . CESAREAN SECTION     x2  . CHOLECYSTECTOMY  2018  . HEMORRHOID SURGERY    .  RENAL ANGIOGRAPHY N/A 05/20/2019   Procedure: RENAL ANGIOGRAPHY;  Surgeon: Iran Ouch, MD;  Location: [facility];  Service: Cardiovascular;  Laterality: N/A;  . RENAL INTERVENTION  05/20/2019   Procedure: RENAL INTERVENTION;  Surgeon: Iran Ouch, MD;  Location: [facility];  Service: Cardiovascular;;  . Right breast lumpectomy    . ROTATOR CUFF REPAIR  2008  . UPPER EXTREMITY ANGIOGRAPHY N/A 11/27/2017   Procedure: UPPER EXTREMITY ANGIOGRAPHY;  Surgeon: Cephus Shelling, MD;  Location: [facility];  Service: Cardiovascular;  Laterality: N/A;    Social History   Socioeconomic History  . Marital status: Widowed    Spouse name: Not on file  . Number of children: 2  . Years of education: 12th  . Highest education level: 12th grade  Occupational History    Employer: RETIRED    Comment: n/a  Tobacco Use  . Smoking status: Former Smoker    Packs/day: 1.00    Years: 35.00    Pack years: 35.00    Types: Cigarettes    Quit date: 01/22/1998    Years since quitting: 22.0  . Smokeless tobacco: Never Used  . Tobacco comment: tobacco use - no  Vaping Use  . Vaping Use: Never used  Substance and Sexual Activity  . Alcohol use: No  . Drug use: No  . Sexual activity: Not on file  Other Topics Concern  . Not on file  Social History Narrative   Patient lives at home with family.  Two sons and two granddaughters live with her.   Caffeine use; 2-5 cups daily   Social Determinants of Health   Financial Resource Strain: Not on file  Food Insecurity: Not on file  Transportation Needs: Not on file  Physical Activity: Not on file  Stress: Not on file   Social Connections: Not on file  Intimate Partner Violence: Not on file        Objective:    BP (!) 145/70   Pulse 65   Temp (!) 97.2 F (36.2 C) (Temporal)   Ht 5' 3.5" (1.613 m)   Wt 132 lb 6.4 oz (60.1 kg)   SpO2 95%   BMI 23.09 kg/m   Wt Readings from Last 3 Encounters:  02/11/20 132 lb 6.4 oz (60.1 kg)  10/20/19 134 lb 3.2 oz (60.9 kg)  07/21/19 138 lb 9.6 oz (62.9 kg)    Physical Exam Vitals reviewed.  Constitutional:      General: She is not in acute distress.    Appearance: Normal appearance. She is normal weight. She is not ill-appearing, toxic-appearing or diaphoretic.  HENT:     Head: Normocephalic and atraumatic.  Eyes:     General: No scleral icterus.       Right eye: No discharge.        Left eye: No discharge.     Conjunctiva/sclera: Conjunctivae normal.  Cardiovascular:     Rate and Rhythm: Normal rate and regular rhythm.     Heart sounds: Normal heart sounds. No murmur heard. No friction rub. No gallop.   Pulmonary:     Effort: Pulmonary effort is normal. No respiratory distress.     Breath sounds: Normal breath sounds. No stridor. No wheezing, rhonchi or rales.  Musculoskeletal:        General: Normal range of motion.     Cervical back: Normal range of motion.     Right lower leg: Edema present.     Left lower leg:  Edema present.  Skin:    General: Skin is warm and dry.     Capillary Refill: Capillary refill takes less than 2 seconds.  Neurological:     General: No focal deficit present.     Mental Status: She is alert and oriented to person, place, and time. Mental status is at baseline.  Psychiatric:        Mood and Affect: Mood normal.        Behavior: Behavior normal.        Thought Content: Thought content normal.        Judgment: Judgment normal.     No results found for: TSH Lab Results  Component Value Date   WBC 7.3 05/12/2019   HGB 14.0 05/12/2019   HCT 40.9 05/12/2019   MCV 87 05/12/2019   PLT 244 05/12/2019   Lab  Results  Component Value Date   NA 138 05/12/2019   K 3.9 05/12/2019   CO2 23 05/12/2019   GLUCOSE 106 (H) 05/12/2019   BUN 18 05/12/2019   CREATININE 1.13 (H) 05/12/2019   BILITOT 0.6 10/22/2018   ALKPHOS 85 10/22/2018   AST 22 10/22/2018   ALT 13 10/22/2018   PROT 6.7 10/22/2018   ALBUMIN 4.5 10/22/2018   CALCIUM 10.1 05/12/2019   ANIONGAP 6 06/18/2016   Lab Results  Component Value Date   CHOL 177 04/13/2019   Lab Results  Component Value Date   HDL 85 04/13/2019   Lab Results  Component Value Date   LDLCALC 75 04/13/2019   Lab Results  Component Value Date   TRIG 99 04/13/2019   Lab Results  Component Value Date   CHOLHDL 2.1 04/13/2019   No results found for: HGBA1C

## 2020-02-16 ENCOUNTER — Other Ambulatory Visit: Payer: Self-pay

## 2020-02-16 ENCOUNTER — Encounter: Payer: Self-pay | Admitting: Cardiovascular Disease

## 2020-02-16 ENCOUNTER — Ambulatory Visit (INDEPENDENT_AMBULATORY_CARE_PROVIDER_SITE_OTHER): Payer: Medicare Other | Admitting: Cardiovascular Disease

## 2020-02-16 VITALS — BP 130/68 | HR 67 | Ht 63.5 in | Wt 131.6 lb

## 2020-02-16 DIAGNOSIS — E785 Hyperlipidemia, unspecified: Secondary | ICD-10-CM

## 2020-02-16 DIAGNOSIS — R0602 Shortness of breath: Secondary | ICD-10-CM | POA: Diagnosis not present

## 2020-02-16 DIAGNOSIS — I701 Atherosclerosis of renal artery: Secondary | ICD-10-CM | POA: Diagnosis not present

## 2020-02-16 DIAGNOSIS — I10 Essential (primary) hypertension: Secondary | ICD-10-CM | POA: Diagnosis not present

## 2020-02-16 DIAGNOSIS — R072 Precordial pain: Secondary | ICD-10-CM

## 2020-02-16 DIAGNOSIS — I779 Disorder of arteries and arterioles, unspecified: Secondary | ICD-10-CM

## 2020-02-16 NOTE — Patient Instructions (Signed)
Medication Instructions:  No changes *If you need a refill on your cardiac medications before your next appointment, please call your pharmacy*   Lab Work: None ordered If you have labs (blood work) drawn today and your tests are completely normal, you will receive your results only by: Marland Kitchen MyChart Message (if you have MyChart) OR . A paper copy in the mail If you have any lab test that is abnormal or we need to change your treatment, we will call you to review the results.   Testing/Procedures: Your physician has requested that you have an echocardiogram. Echocardiography is a painless test that uses sound waves to create images of your heart. It provides your doctor with information about the size and shape of your heart and how well your heart's chambers and valves are working. You may receive an ultrasound enhancing agent through an IV if needed to better visualize your heart during the echo.This procedure takes approximately one hour. There are no restrictions for this procedure.  Your physician has requested that you have a lexiscan myoview. For further information please visit [email or web address]. Please follow instruction sheet, as given. This will take place at [place], [address]  How to prepare for your Myocardial Perfusion Test:  Do not eat or drink 3 hours prior to your test, except you may have water.  Do not consume products containing caffeine (regular or decaffeinated) 12 hours prior to your test. (ex: coffee, chocolate, sodas, tea).  Do bring a list of your current medications with you.  If not listed below, you may take your medications as normal.  Do wear comfortable clothes (no dresses or overalls) and walking shoes, tennis shoes preferred (No heels or open toe shoes are allowed).  Do NOT wear cologne, perfume, aftershave, or lotions (deodorant is allowed).  The test will take approximately 3 to 4 hours to complete  If these instructions are not followed,  your test will have to be rescheduled.  Follow-Up: At [facility], you and your health needs are our priority.  As part of our continuing mission to provide you with exceptional heart care, we have created designated Provider Care Teams.  These Care Teams include your primary Cardiologist (physician) and Advanced Practice Providers (APPs -  Physician Assistants and Nurse Practitioners) who all work together to provide you with the care you need, when you need it.  We recommend signing up for the patient portal called "MyChart".  Sign up information is provided on this After Visit Summary.  MyChart is used to connect with patients for Virtual Visits (Telemedicine).  Patients are able to view lab/test results, encounter notes, upcoming appointments, etc.  Non-urgent messages can be sent to your provider as well.   To learn more about what you can do with MyChart, go to [email or web address].    Your next appointment:   6 month(s)  The format for your next appointment:   In Person  Provider:   Kathlyn Sacramento, MD

## 2020-02-16 NOTE — Progress Notes (Signed)
Cardiology Office Note   Date:  02/16/2020   ID:  Tamara, King 09-24-1940, MRN [id number]  PCP:  Loman Brooklyn, FNP  Cardiologist: Dr. Domenic Polite  No chief complaint on file.     History of Present Illness: Tamara King is a 80 y.o. female who is here today for follow-up visit regarding renal artery stenosis.   She has history of mild nonobstructive coronary artery disease on previous cardiac catheterization in 2003.  She has known history of renal artery stenosis.  She underwent bilateral renal artery stenting in December 2003.  She had restenosis in 2005 and had repeat angiography by Dr. Albertine Patricia.  She underwent bilateral angioplasty for in-stent restenosis. She has chronic medical conditions that include difficult to control hypertension, hyperlipidemia, carotid disease with known occluded left carotid artery, mild chronic kidney disease, previous tobacco use and left bundle branch block. She has intolerance to multiple antihypertensive medications according to the notes.  She is not able to tolerate ACE inhibitors or ARB due to an allergic reaction.  Hydrochlorothiazide caused constipation.  Spironolactone caused worsening renal function.   She was seen last year for severe left renal artery in-stent restenosis with uncontrolled hypertension. I proceeded with angiography in April 2021 which showed patent bilateral renal artery stent with significant restenosis on the left side.  I performed successful balloon angioplasty of the left renal artery stent.  Postprocedure duplex showed improved velocity but still elevated in the left side at 291. However, repeat Doppler in November of last year showed improvement in velocity to a maximum of 223. She has been doing reasonably well but she describes worsening exertional dyspnea happening with minimal exertion.  In addition, she has been having intermittent episodes of chest pain described as aching sensation across the right and left  side of the chest with no radiation.  She does not feel too well.   Past Medical History:  Diagnosis Date  . Breast cancer ([facility])    Adenocarcinoma  . Chronic edema   . CKD (chronic kidney disease) stage 3, GFR 30-59 ml/min ([place]) 10/22/2018  . Coronary atherosclerosis of native coronary artery    Nonobstructive at catheterization 2003, LVEF normal   . Essential hypertension   . GERD (gastroesophageal reflux disease) 07/06/2013  . Idiopathic peripheral neuropathy 10/06/2013  . Mixed hyperlipidemia   . Occlusion and stenosis of left carotid artery 10/22/2017  . Renal artery stenosis ([facility])    BMS bilateally 2003, PTCA bilaterally 2005 with restenosis  . Sleep apnea    Stop Bang score of 4  . Stroke ([place])   . Symptomatic varicose veins, right 10/16/2018    Past Surgical History:  Procedure Laterality Date  . APPENDECTOMY    . CATARACT EXTRACTION W/PHACO  03/29/2011   Procedure: CATARACT EXTRACTION PHACO AND INTRAOCULAR LENS PLACEMENT (IOC);  Surgeon: Tonny Branch, MD;  Location: [facility];  Service: Ophthalmology;  Laterality: Right;  CDE:12.82  . CATARACT EXTRACTION W/PHACO  04/16/2011   Procedure: CATARACT EXTRACTION PHACO AND INTRAOCULAR LENS PLACEMENT (IOC);  Surgeon: Tonny Branch, MD;  Location: [facility];  Service: Ophthalmology;  Laterality: Left;  CDE: 11.54  . CESAREAN SECTION     x2  . CHOLECYSTECTOMY  2018  . HEMORRHOID SURGERY    . RENAL ANGIOGRAPHY N/A 05/20/2019   Procedure: RENAL ANGIOGRAPHY;  Surgeon: Wellington Hampshire, MD;  Location: [facility];  Service: Cardiovascular;  Laterality: N/A;  . RENAL INTERVENTION  05/20/2019   Procedure: RENAL INTERVENTION;  Surgeon:  Wellington Hampshire, MD;  Location: [facility];  Service: Cardiovascular;;  . Right breast lumpectomy    . ROTATOR CUFF REPAIR  2008  . UPPER EXTREMITY ANGIOGRAPHY N/A 11/27/2017   Procedure: UPPER EXTREMITY ANGIOGRAPHY;  Surgeon: Marty Heck, MD;  Location: [facility];  Service:  Cardiovascular;  Laterality: N/A;     Current Outpatient Medications  Medication Sig Dispense Refill  . acetaminophen (TYLENOL) 500 MG tablet Take 500 mg by mouth every 6 (six) hours as needed for moderate pain.    Marland Kitchen albuterol (VENTOLIN HFA) 108 (90 Base) MCG/ACT inhaler Inhale 2 puffs into the lungs every 6 (six) hours as needed for wheezing or shortness of breath. 18 g 0  . alendronate (FOSAMAX) 70 MG tablet Take 1 tablet (70 mg total) by mouth every 7 (seven) days. Take with a full glass of water on an empty stomach. 4 tablet 5  . amLODipine (NORVASC) 10 MG tablet Take 1 tablet (10 mg total) by mouth daily. 90 tablet 1  . aspirin EC 81 MG tablet Take 81 mg by mouth daily.    . carvedilol (COREG) 25 MG tablet Take 1 tablet (25 mg total) by mouth 2 (two) times daily. 180 tablet 1  . cholecalciferol (VITAMIN D3) 25 MCG (1000 UNIT) tablet Take 1,000 Units by mouth daily.    . clopidogrel (PLAVIX) 75 MG tablet Take 1 tablet (75 mg total) by mouth daily. 90 tablet 1  . EQ ALLERGY RELIEF, CETIRIZINE, 10 MG tablet Take 1 tablet by mouth once daily 90 tablet 0  . famotidine (PEPCID) 20 MG tablet Take 1 tablet (20 mg total) by mouth daily. 90 tablet 1  . fluticasone (FLONASE) 50 MCG/ACT nasal spray Place 2 sprays into the nose daily as needed for allergies (congestion.).     Marland Kitchen furosemide (LASIX) 20 MG tablet Take 20 mg by mouth every evening. Takes in addition to 40 mg in the AM.    . furosemide (LASIX) 40 MG tablet Take 1 tablet (40 mg total) by mouth daily. 90 tablet 1  . hydrALAZINE (APRESOLINE) 10 MG tablet Take 2 tablets (20 mg total) by mouth 3 (three) times daily. 540 tablet 1  . ondansetron (ZOFRAN) 4 MG tablet TAKE 1 TABLET BY MOUTH EVERY 8 HOURS AS NEEDED FOR NAUSEA OR VOMITING 20 tablet 0  . pantoprazole (PROTONIX) 40 MG tablet 1 tablet daily 90 tablet 1  . pravastatin (PRAVACHOL) 80 MG tablet Take 1 tablet (80 mg total) by mouth daily. 90 tablet 1  . Tetrahydrozoline HCl (VISINE OP) Place  2 drops into both eyes daily as needed (dry eyes).     No current facility-administered medications for this visit.    Allergies:   Losartan, Sulfa antibiotics, Codeine, Fish allergy, Other, Spironolactone, Vancomycin, and Penicillins    Social History:  The patient  reports that she quit smoking about 22 years ago. Her smoking use included cigarettes. She has a 35.00 pack-year smoking history. She has never used smokeless tobacco. She reports that she does not drink alcohol and does not use drugs.   Family History:  The patient's family history includes Brain cancer in her brother and sister; COPD in her sister; Diabetes in her brother, brother, father, sister, son, and son; Heart failure in her mother; Lung cancer in her father; Stroke (age of onset: 32) in her mother; Thyroid cancer in her son.    ROS:  Please see the history of present illness.   Otherwise, review of  systems are positive for none.   All other systems are reviewed and negative.    PHYSICAL EXAM: VS:  BP 130/68   Pulse 67   Ht 5' 3.5" (1.613 m)   Wt 131 lb 9.6 oz (59.7 kg)   BMI 22.95 kg/m  , BMI Body mass index is 22.95 kg/m. GEN: Well nourished, well developed, in no acute distress  HEENT: normal  Neck: no JVD,  or masses.  Bilateral carotid bruits Cardiac: RRR; no  rubs, or gallops.  2/ 6 systolic murmur in the aortic area which is early peaking.  There is mild bilateral leg edema with significant tenderness. Respiratory:  clear to auscultation bilaterally, normal work of breathing GI: soft, nontender, nondistended, + BS MS: no deformity or atrophy  Skin: warm and dry, no rash Neuro:  Strength and sensation are intact Psych: euthymic mood, full affect    EKG:  EKG is ordered today. EKG showed normal sinus rhythm with possible anteroseptal infarct with nonspecific IVCD.  These changes are not new compared to most recent EKG from last year.   Recent Labs: 05/12/2019: BUN 18; Creatinine, Ser 1.13;  Hemoglobin 14.0; Platelets 244; Potassium 3.9; Sodium 138    Lipid Panel    Component Value Date/Time   CHOL 177 04/13/2019 0850   TRIG 99 04/13/2019 0850   HDL 85 04/13/2019 0850   CHOLHDL 2.1 04/13/2019 0850   LDLCALC 75 04/13/2019 0850      Wt Readings from Last 3 Encounters:  02/16/20 131 lb 9.6 oz (59.7 kg)  02/11/20 132 lb 6.4 oz (60.1 kg)  10/20/19 134 lb 3.2 oz (60.9 kg)        No flowsheet data found.    ASSESSMENT AND PLAN:  1.  Renal artery stenosis: Status post recent balloon angioplasty of the left renal artery in-stent restenosis.  Most recent Doppler in November showed patent bilateral renal artery stents with no significant restenosis.  She is on long-term dual antiplatelet therapy.  2.  Carotid artery disease: Continue with yearly Doppler.  Known occluded left ICA with mild disease on the right side.  3.  Hyperlipidemia: She is currently on high-dose pravastatin.  Most recent lipid profile showed an LDL of 75 which is close to target.  4.  Hypertension: Blood pressure is reasonably controlled on current medications.  5.  Atypical chest pain: She reports recent episodes of chest pain at rest.  EKG with no new changes.  In addition, she reports worsening exertional dyspnea with minimal exertion.  Due to that, I recommend a Lexiscan Myoview and an echocardiogram.  She has a follow-up appointment scheduled with Dr. Domenic Polite next month.    Disposition:   FU with me in 6 months  Signed,  Kathlyn Sacramento, MD  02/16/2020 10:36 AM    [place]

## 2020-02-17 ENCOUNTER — Other Ambulatory Visit: Payer: Self-pay | Admitting: Family Medicine

## 2020-02-17 DIAGNOSIS — J988 Other specified respiratory disorders: Secondary | ICD-10-CM

## 2020-02-25 ENCOUNTER — Ambulatory Visit (HOSPITAL_COMMUNITY)
Admission: RE | Admit: 2020-02-25 | Discharge: 2020-02-25 | Disposition: A | Discharge status: Home / Self Care | Payer: Medicare Other | Source: Ambulatory Visit | Attending: Cardiovascular Disease | Admitting: Cardiovascular Disease

## 2020-02-25 ENCOUNTER — Other Ambulatory Visit: Payer: Self-pay

## 2020-02-25 DIAGNOSIS — R072 Precordial pain: Secondary | ICD-10-CM | POA: Insufficient documentation

## 2020-02-25 LAB — MYOCARDIAL PERFUSION IMAGING
LV dias vol: 88 mL (ref 46–106)
LV sys vol: 35 mL
Peak HR: 88 {beats}/min
Rest HR: 64 {beats}/min
SDS: 2
SRS: 1
SSS: 3
TID: 1.15

## 2020-02-25 MED ORDER — REGADENOSON 0.4 MG/5ML IV SOLN
0.4000 mg | Freq: Once | INTRAVENOUS | Status: AC
  Administered 2020-02-25: 0.4 mg via INTRAVENOUS

## 2020-02-25 MED ORDER — TECHNETIUM TC 99M TETROFOSMIN IV KIT
30.0000 | PACK | Freq: Once | INTRAVENOUS | Status: AC | PRN
  Administered 2020-02-25: 30 via INTRAVENOUS
  Filled 2020-02-25: qty 30

## 2020-02-25 MED ORDER — TECHNETIUM TC 99M TETROFOSMIN IV KIT
10.9000 | PACK | Freq: Once | INTRAVENOUS | Status: AC | PRN
  Administered 2020-02-25: 10.9 via INTRAVENOUS
  Filled 2020-02-25: qty 11

## 2020-03-02 ENCOUNTER — Encounter: Payer: Self-pay | Admitting: *Deleted

## 2020-03-03 ENCOUNTER — Ambulatory Visit (INDEPENDENT_AMBULATORY_CARE_PROVIDER_SITE_OTHER): Payer: Medicare Other

## 2020-03-03 DIAGNOSIS — R0602 Shortness of breath: Secondary | ICD-10-CM

## 2020-03-03 LAB — ECHOCARDIOGRAM COMPLETE
Area-P 1/2: 2.79 cm2
Calc EF: 66 %
MV M vel: 4.93 m/s
MV Peak grad: 97.2 mmHg
P 1/2 time: 642 msec
Radius: 0.6 cm
S' Lateral: 2.64 cm
Single Plane A2C EF: 56.4 %
Single Plane A4C EF: 73.5 %

## 2020-03-03 NOTE — Progress Notes (Signed)
Cardiology Office Note  Date: 03/04/2020   ID: Marcayla, Budge 01-08-1941, MRN [id number]  PCP:  Loman Brooklyn, FNP  Cardiologist:  Rozann Lesches, MD Electrophysiologist:  None   Chief Complaint  Patient presents with  . Cardiac follow-up    History of Present Illness: Tamara King is a 80 y.o. female last seen in March 2021.  She presents for a routine follow-up visit.  Reports no exertional chest pain, stable dyspnea on exertion with typical activities.  She had a recent interval follow-up with Dr. Fletcher Anon in late January for follow-up of renal artery stenosis.  She described worsening dyspnea on exertion and was referred for a follow-up Lexiscan Myoview.  Procedure was performed on February 3 and fortunately was low risk without evidence of ischemia and normal LVEF.  Follow-up echocardiogram is LVEF with normal diastolic parameters, normal RV contraction with RVSP estimated 44 mmHg, mild mitral and trivial aortic regurgitation.  I went over the results of the studies with her today.  Systolic blood pressures in the 130s today, medications are stable as noted below.  She will be having follow-up lab work with her nephrologist and PCP in the next month.  She is overdue for follow-up carotid Dopplers which will be arranged.  Past Medical History:  Diagnosis Date  . Breast cancer ([facility])    Adenocarcinoma  . Chronic edema   . CKD (chronic kidney disease) stage 3, GFR 30-59 ml/min ([place]) 10/22/2018  . Coronary atherosclerosis of native coronary artery    Nonobstructive at catheterization 2003, LVEF normal   . Essential hypertension   . GERD (gastroesophageal reflux disease) 07/06/2013  . Idiopathic peripheral neuropathy 10/06/2013  . Mixed hyperlipidemia   . Occlusion and stenosis of left carotid artery 10/22/2017  . Renal artery stenosis ([facility])    BMS bilateally 2003, PTCA bilaterally 2005 with restenosis  . Sleep apnea    Stop Bang score of 4  . Stroke ([place])   .  Symptomatic varicose veins, right 10/16/2018    Past Surgical History:  Procedure Laterality Date  . APPENDECTOMY    . CATARACT EXTRACTION W/PHACO  03/29/2011   Procedure: CATARACT EXTRACTION PHACO AND INTRAOCULAR LENS PLACEMENT (IOC);  Surgeon: Tonny Branch, MD;  Location: [facility];  Service: Ophthalmology;  Laterality: Right;  CDE:12.82  . CATARACT EXTRACTION W/PHACO  04/16/2011   Procedure: CATARACT EXTRACTION PHACO AND INTRAOCULAR LENS PLACEMENT (IOC);  Surgeon: Tonny Branch, MD;  Location: [facility];  Service: Ophthalmology;  Laterality: Left;  CDE: 11.54  . CESAREAN SECTION     x2  . CHOLECYSTECTOMY  2018  . HEMORRHOID SURGERY    . RENAL ANGIOGRAPHY N/A 05/20/2019   Procedure: RENAL ANGIOGRAPHY;  Surgeon: Wellington Hampshire, MD;  Location: [facility];  Service: Cardiovascular;  Laterality: N/A;  . RENAL INTERVENTION  05/20/2019   Procedure: RENAL INTERVENTION;  Surgeon: Wellington Hampshire, MD;  Location: [facility];  Service: Cardiovascular;;  . Right breast lumpectomy    . ROTATOR CUFF REPAIR  2008  . UPPER EXTREMITY ANGIOGRAPHY N/A 11/27/2017   Procedure: UPPER EXTREMITY ANGIOGRAPHY;  Surgeon: Marty Heck, MD;  Location: [facility];  Service: Cardiovascular;  Laterality: N/A;    Current Outpatient Medications  Medication Sig Dispense Refill  . acetaminophen (TYLENOL) 500 MG tablet Take 500 mg by mouth every 6 (six) hours as needed for moderate pain.    Marland Kitchen albuterol (VENTOLIN HFA) 108 (90 Base) MCG/ACT inhaler INHALE 2 PUFFS BY MOUTH  EVERY 6 HOURS AS NEEDED FOR WHEEZING AND FOR SHORTNESS OF BREATH 18 g 2  . alendronate (FOSAMAX) 70 MG tablet Take 1 tablet (70 mg total) by mouth every 7 (seven) days. Take with a full glass of water on an empty stomach. 4 tablet 5  . amLODipine (NORVASC) 10 MG tablet Take 1 tablet (10 mg total) by mouth daily. 90 tablet 1  . aspirin EC 81 MG tablet Take 81 mg by mouth daily.    . carvedilol (COREG) 25 MG tablet Take 1 tablet (25 mg  total) by mouth 2 (two) times daily. 180 tablet 1  . cholecalciferol (VITAMIN D3) 25 MCG (1000 UNIT) tablet Take 1,000 Units by mouth daily.    . clopidogrel (PLAVIX) 75 MG tablet Take 1 tablet (75 mg total) by mouth daily. 90 tablet 1  . EQ ALLERGY RELIEF, CETIRIZINE, 10 MG tablet Take 1 tablet by mouth once daily 90 tablet 0  . famotidine (PEPCID) 20 MG tablet Take 1 tablet (20 mg total) by mouth daily. 90 tablet 1  . fluticasone (FLONASE) 50 MCG/ACT nasal spray Place 2 sprays into the nose daily as needed for allergies (congestion.).     Marland Kitchen furosemide (LASIX) 20 MG tablet Take 20 mg by mouth every evening. Takes in addition to 40 mg in the AM.    . furosemide (LASIX) 40 MG tablet Take 1 tablet (40 mg total) by mouth daily. 90 tablet 1  . hydrALAZINE (APRESOLINE) 10 MG tablet Take 2 tablets (20 mg total) by mouth 3 (three) times daily. 540 tablet 1  . ondansetron (ZOFRAN) 4 MG tablet TAKE 1 TABLET BY MOUTH EVERY 8 HOURS AS NEEDED FOR NAUSEA OR VOMITING 20 tablet 0  . pantoprazole (PROTONIX) 40 MG tablet 1 tablet daily 90 tablet 1  . pravastatin (PRAVACHOL) 80 MG tablet Take 1 tablet (80 mg total) by mouth daily. 90 tablet 1  . Tetrahydrozoline HCl (VISINE OP) Place 2 drops into both eyes daily as needed (dry eyes).     No current facility-administered medications for this visit.   Allergies:  Losartan, Sulfa antibiotics, Codeine, Fish allergy, Other, Spironolactone, Vancomycin, and Penicillins   ROS: No palpitations or syncope.  Physical Exam: VS:  BP (!) 138/58   Pulse 76   Ht 5' 3.5" (1.613 m)   Wt 133 lb (60.3 kg)   SpO2 94%   BMI 23.19 kg/m , BMI Body mass index is 23.19 kg/m.  Wt Readings from Last 3 Encounters:  03/04/20 133 lb (60.3 kg)  02/25/20 131 lb (59.4 kg)  02/16/20 131 lb 9.6 oz (59.7 kg)    General: Elderly woman, appears comfortable at rest. HEENT: Conjunctiva and lids normal, wearing a mask. Neck: Supple, no elevated JVP, bilateral carotid bruits, no  thyromegaly. Lungs: Clear to auscultation, nonlabored breathing at rest. Cardiac: Regular rate and rhythm, no S3, soft systolic murmur, no pericardial rub. Extremities: No pitting edema.  ECG:  An ECG dated 02/16/2020 was personally reviewed today and demonstrated:  Sinus rhythm with IVCD, rule out old anteroseptal infarct pattern.  Recent Labwork: 05/12/2019: BUN 18; Creatinine, Ser 1.13; Hemoglobin 14.0; Platelets 244; Potassium 3.9; Sodium 138     Component Value Date/Time   CHOL 177 04/13/2019 0850   TRIG 99 04/13/2019 0850   HDL 85 04/13/2019 0850   CHOLHDL 2.1 04/13/2019 0850   LDLCALC 75 04/13/2019 0850    Other Studies Reviewed Today:  [facility] Myoview 02/25/2020:  The left ventricular ejection fraction is normal (55-65%).  Nuclear stress  EF: 61%.  There was no ST segment deviation noted during stress.  The study is normal.  This is a low risk study.  Paradoxical septal motion due to conduction delay, otherwise normal wall motion.   No ischemia or infarction on perfusion imaging. Low risk stress test.   Echocardiogram 03/03/2020: 1. Left ventricular ejection fraction, by estimation, is 65 to 70%. The  left ventricle has normal function. The left ventricle has no regional  wall motion abnormalities. Left ventricular diastolic parameters were  normal. Normal global longitudinal  strain of -18.5%.  2. Right ventricular systolic function is normal. The right ventricular  size is normal. There is mildly elevated pulmonary artery systolic  pressure. The estimated right ventricular systolic pressure is 44.6 mmHg.  3. The mitral valve is grossly normal. Mild mitral valve regurgitation.  4. The aortic valve is tricuspid. Aortic valve regurgitation is trivial.  Aortic regurgitation PHT measures 642 msec.  5. The inferior vena cava is normal in size with greater than 50%  respiratory variability, suggesting right atrial pressure of 3 mmHg.   Assessment and Plan:  1.   Bilateral carotid artery disease with occluded LICA and mild RICA stenosis.  We will arrange follow-up carotid Dopplers.  Otherwise plan to continue aspirin and Pravachol.  2.  Dyspnea on exertion, relatively stable at this point and NYHA class II with basic ADLs.  Recent follow-up cardiac structural and ischemic testing are outlined above.  Plan to continue medical therapy in the absence of any ischemic territories.  LVEF is vigorous, she does have mildly elevated pulmonary artery systolic pressure.  3.  Renal artery stenosis with history of bilateral stent intervention.  She continues to follow with Dr. Fletcher Anon.  4.  Essential hypertension, systolic in the 950H today.  To new Norvasc, Coreg, and hydralazine.  Medication Adjustments/Labs and Tests Ordered: Current medicines are reviewed at length with the patient today.  Concerns regarding medicines are outlined above.   Tests Ordered: Orders Placed This Encounter  Procedures  . VAS US CAROTID    Medication Changes: No orders of the defined types were placed in this encounter.   Disposition:  Follow up 6 months in the [place] office.  Signed, Satira Sark, MD, [facility] 03/04/2020 9:19 AM    [facility] [place], [place], [place] [address] Phone: [phone]; Fax: [phone]

## 2020-03-04 ENCOUNTER — Encounter: Payer: Self-pay | Admitting: Cardiology

## 2020-03-04 ENCOUNTER — Ambulatory Visit (INDEPENDENT_AMBULATORY_CARE_PROVIDER_SITE_OTHER): Payer: Medicare Other | Admitting: Cardiology

## 2020-03-04 VITALS — BP 138/58 | HR 76 | Ht 63.5 in | Wt 133.0 lb

## 2020-03-04 DIAGNOSIS — I6523 Occlusion and stenosis of bilateral carotid arteries: Secondary | ICD-10-CM | POA: Diagnosis not present

## 2020-03-04 DIAGNOSIS — I10 Essential (primary) hypertension: Secondary | ICD-10-CM | POA: Diagnosis not present

## 2020-03-04 DIAGNOSIS — I701 Atherosclerosis of renal artery: Secondary | ICD-10-CM

## 2020-03-04 NOTE — Patient Instructions (Signed)
Medication Instructions:  Your physician recommends that you continue on your current medications as directed. Please refer to the Current Medication list given to you today.  *If you need a refill on your cardiac medications before your next appointment, please call your pharmacy*   Lab Work: None today   If you have labs (blood work) drawn today and your tests are completely normal, you will receive your results only by: . MyChart Message (if you have MyChart) OR . A paper copy in the mail If you have any lab test that is abnormal or we need to change your treatment, we will call you to review the results.   Testing/Procedures: Your physician has requested that you have a carotid duplex. This test is an ultrasound of the carotid arteries in your neck. It looks at blood flow through these arteries that supply the brain with blood. Allow one hour for this exam. There are no restrictions or special instructions.     Follow-Up: At [facility], you and your health needs are our priority.  As part of our continuing mission to provide you with exceptional heart care, we have created designated Provider Care Teams.  These Care Teams include your primary Cardiologist (physician) and Advanced Practice Providers (APPs -  Physician Assistants and Nurse Practitioners) who all work together to provide you with the care you need, when you need it.  We recommend signing up for the patient portal called "MyChart".  Sign up information is provided on this After Visit Summary.  MyChart is used to connect with patients for Virtual Visits (Telemedicine).  Patients are able to view lab/test results, encounter notes, upcoming appointments, etc.  Non-urgent messages can be sent to your provider as well.   To learn more about what you can do with MyChart, go to [email or web address].com.    Your next appointment:   6 month(s)  The format for your next appointment:   In Person  Provider:   Samuel  McDowell, MD   Other Instructions None     Thank you for choosing [facility] !         

## 2020-03-07 ENCOUNTER — Other Ambulatory Visit: Payer: Medicare Other

## 2020-03-07 ENCOUNTER — Other Ambulatory Visit: Payer: Self-pay

## 2020-04-06 ENCOUNTER — Ambulatory Visit (INDEPENDENT_AMBULATORY_CARE_PROVIDER_SITE_OTHER): Payer: Medicare Other

## 2020-04-06 ENCOUNTER — Other Ambulatory Visit: Payer: Self-pay | Admitting: Cardiology

## 2020-04-06 DIAGNOSIS — I6523 Occlusion and stenosis of bilateral carotid arteries: Secondary | ICD-10-CM

## 2020-04-08 ENCOUNTER — Telehealth: Payer: Self-pay | Admitting: *Deleted

## 2020-04-08 NOTE — Telephone Encounter (Signed)
-----   Message from Satira Sark, MD sent at 04/07/2020  4:43 PM EDT ----- Results reviewed.  Follow-up carotid Dopplers are stable.  LICA occluded as before with only mild RICA stenosis.  Continue with medications and follow-up plan.

## 2020-04-08 NOTE — Telephone Encounter (Signed)
Patient informed. Copy sent to PCP °

## 2020-04-09 ENCOUNTER — Other Ambulatory Visit: Payer: Self-pay | Admitting: Family Medicine

## 2020-04-09 DIAGNOSIS — J302 Other seasonal allergic rhinitis: Secondary | ICD-10-CM

## 2020-04-09 DIAGNOSIS — K219 Gastro-esophageal reflux disease without esophagitis: Secondary | ICD-10-CM

## 2020-04-23 ENCOUNTER — Other Ambulatory Visit: Payer: Self-pay | Admitting: Family Medicine

## 2020-04-23 DIAGNOSIS — I10 Essential (primary) hypertension: Secondary | ICD-10-CM

## 2020-04-23 DIAGNOSIS — I6522 Occlusion and stenosis of left carotid artery: Secondary | ICD-10-CM

## 2020-04-23 DIAGNOSIS — Z8673 Personal history of transient ischemic attack (TIA), and cerebral infarction without residual deficits: Secondary | ICD-10-CM

## 2020-05-11 ENCOUNTER — Other Ambulatory Visit: Payer: Self-pay

## 2020-05-11 ENCOUNTER — Ambulatory Visit (INDEPENDENT_AMBULATORY_CARE_PROVIDER_SITE_OTHER): Payer: Medicare Other | Admitting: Family Medicine

## 2020-05-11 ENCOUNTER — Encounter: Payer: Self-pay | Admitting: Family Medicine

## 2020-05-11 VITALS — BP 124/67 | HR 65 | Temp 97.2°F | Ht 63.5 in | Wt 131.2 lb

## 2020-05-11 DIAGNOSIS — R600 Localized edema: Secondary | ICD-10-CM

## 2020-05-11 DIAGNOSIS — N1832 Chronic kidney disease, stage 3b: Secondary | ICD-10-CM

## 2020-05-11 DIAGNOSIS — I6522 Occlusion and stenosis of left carotid artery: Secondary | ICD-10-CM

## 2020-05-11 DIAGNOSIS — I701 Atherosclerosis of renal artery: Secondary | ICD-10-CM | POA: Diagnosis not present

## 2020-05-11 DIAGNOSIS — I10 Essential (primary) hypertension: Secondary | ICD-10-CM

## 2020-05-11 DIAGNOSIS — K219 Gastro-esophageal reflux disease without esophagitis: Secondary | ICD-10-CM

## 2020-05-11 DIAGNOSIS — Z0000 Encounter for general adult medical examination without abnormal findings: Secondary | ICD-10-CM

## 2020-05-11 DIAGNOSIS — Z8673 Personal history of transient ischemic attack (TIA), and cerebral infarction without residual deficits: Secondary | ICD-10-CM

## 2020-05-11 DIAGNOSIS — E559 Vitamin D deficiency, unspecified: Secondary | ICD-10-CM

## 2020-05-11 DIAGNOSIS — E785 Hyperlipidemia, unspecified: Secondary | ICD-10-CM | POA: Diagnosis not present

## 2020-05-11 DIAGNOSIS — J302 Other seasonal allergic rhinitis: Secondary | ICD-10-CM

## 2020-05-11 DIAGNOSIS — M810 Age-related osteoporosis without current pathological fracture: Secondary | ICD-10-CM

## 2020-05-11 DIAGNOSIS — M8949 Other hypertrophic osteoarthropathy, multiple sites: Secondary | ICD-10-CM

## 2020-05-11 DIAGNOSIS — M159 Polyosteoarthritis, unspecified: Secondary | ICD-10-CM

## 2020-05-11 NOTE — Patient Instructions (Signed)
Please resume using your Flonase and Albuterol inhaler.

## 2020-05-11 NOTE — Progress Notes (Signed)
Assessment & Plan:  1. Essential (primary) hypertension Well controlled on current regimen.  - Thyroid Panel With TSH - Lipid panel (not fasting) - CMP14+EGFR  2. Renal artery stenosis ([place]) Managed by Dr. Fletcher Anon.  3. Occlusion and stenosis of left carotid artery Managed by Dr. Domenic Polite - Lipid panel (not fasting)  4. Hyperlipidemia, unspecified hyperlipidemia type Labs to assess. - Lipid panel (not fasting) - CMP14+EGFR  5. Lower extremity edema Well controlled on current regimen.  - CMP14+EGFR  6. History of CVA (cerebrovascular accident) Continue statin and Plavix. - Lipid panel (not fasting) - CMP14+EGFR  7. Vitamin D deficiency Labs to assess. - VITAMIN D 25 Hydroxy (Vit-D Deficiency, Fractures)  8. Gastroesophageal reflux disease, unspecified whether esophagitis present Well controlled on current regimen.  - CMP14+EGFR  9. Age-related osteoporosis without current pathological fracture Well controlled on current regimen.  - CMP14+EGFR  10. Primary osteoarthritis involving multiple joints Well controlled on current regimen.  - CMP14+EGFR  11. Stage 3b chronic kidney disease ([place]) Managed by Dr. Theador Hawthorne. - CMP14+EGFR  12. Seasonal allergies Encouraged to add Flonase and also use her albuterol inhaler as needed.  [place] maintenance Patient to schedule her mammogram at the [facility] in [place].   Return in about 3 months (around 08/10/2020) for follow-up of chronic medication conditions.  Hendricks Limes, MSN, APRN, FNP-C [facility]  Subjective:    Patient ID: Tamara King, female    DOB: 1940/10/05, 80 y.o.   MRN: [id number]  Patient Care Team: Loman Brooklyn, FNP as PCP - General (Family Medicine) Satira Sark, MD as PCP - Cardiology (Cardiology) Liana Gerold, MD as Consulting Physician (Nephrology) Wellington Hampshire, MD as Consulting Physician (Cardiology)   Chief Complaint:  Chief Complaint   Patient presents with  . Hyperlipidemia  . Hypertension    3 month follow up  . Chest Pain    Patient states that she has been having chest tightness for several days. Thinks it is her allergies.     HPI: Tamara King is a 80 y.o. female presenting on 05/11/2020 for Hyperlipidemia, Hypertension (3 month follow up), and Chest Pain (Patient states that she has been having chest tightness for several days. Thinks it is her allergies. )  Renal artery stenosis: Managed by Dr. Fletcher Anon.  Patient had a balloon angioplasty of the left renal artery in-stent restenosis.  Velocities on Doppler showed patent bilateral renal artery stent with no significant restenosis. She is to remain on dual antiplatelet therapy with ASA and Plavix.   Carotid artery disease: managed by Dr. Domenic Polite, cardiologist. Carotid dopplers repeated in March which are stable. LICA occluded as before and only mild RICA stenosis.   Hypertension: managed by Dr. Domenic Polite, cardiologist.   Hyperlipidemia: Patient is taking pravastatin nightly.  Lower extremity edema: taking Lasix 40 mg in the AM and 20 mg in the PM.   History of CVA: Patient has continued with clopidogrel.  GERD: Patient is taking both Protonix and famotidine.  Vitamin D deficiency: Patient was diagnosed with vitamin D deficiency by her nephrologist last year.  She is on a supplement. She has not had this level re-checked yet.   Osteoporosis: Patient is taking Fosamax.  Osteoarthritis: Patient only takes Tylenol for pain and does not wish to have anything stronger.  Unable to take NSAIDs.  CKD: Managed by Dr. Theador Hawthorne, nephrologist, who she is scheduled to see again on 06/09/2020.  New complaints: Patient reports mild cough, runny nose, postnasal  drainage, and some congestion in her chest.  This has been going on for several days.  She does have seasonal allergies.  She has been taking Zyrtec daily.  She does not have any nasal sprays.   Social  history:  Relevant past medical, surgical, family and social history reviewed and updated as indicated. Interim medical history since our last visit reviewed.  Allergies and medications reviewed and updated.  DATA REVIEWED: CHART IN EPIC  ROS: Negative unless specifically indicated above in HPI.    Current Outpatient Medications:  .  acetaminophen (TYLENOL) 500 MG tablet, Take 500 mg by mouth every 6 (six) hours as needed for moderate pain., Disp: , Rfl:  .  albuterol (VENTOLIN HFA) 108 (90 Base) MCG/ACT inhaler, INHALE 2 PUFFS BY MOUTH EVERY 6 HOURS AS NEEDED FOR WHEEZING AND FOR SHORTNESS OF BREATH, Disp: 18 g, Rfl: 2 .  alendronate (FOSAMAX) 70 MG tablet, Take 1 tablet (70 mg total) by mouth every 7 (seven) days. Take with a full glass of water on an empty stomach., Disp: 4 tablet, Rfl: 5 .  amLODipine (NORVASC) 10 MG tablet, Take 1 tablet by mouth once daily, Disp: 90 tablet, Rfl: 0 .  aspirin EC 81 MG tablet, Take 81 mg by mouth daily., Disp: , Rfl:  .  carvedilol (COREG) 25 MG tablet, Take 1 tablet by mouth twice daily, Disp: 180 tablet, Rfl: 0 .  cholecalciferol (VITAMIN D3) 25 MCG (1000 UNIT) tablet, Take 1,000 Units by mouth daily., Disp: , Rfl:  .  clopidogrel (PLAVIX) 75 MG tablet, Take 1 tablet by mouth once daily, Disp: 90 tablet, Rfl: 0 .  EQ ALLERGY RELIEF, CETIRIZINE, 10 MG tablet, Take 1 tablet by mouth once daily, Disp: 90 tablet, Rfl: 0 .  famotidine (PEPCID) 20 MG tablet, Take 1 tablet by mouth once daily, Disp: 90 tablet, Rfl: 0 .  fluticasone (FLONASE) 50 MCG/ACT nasal spray, Place 2 sprays into the nose daily as needed for allergies (congestion.). , Disp: , Rfl:  .  furosemide (LASIX) 20 MG tablet, Take 20 mg by mouth every evening. Takes in addition to 40 mg in the AM., Disp: , Rfl:  .  furosemide (LASIX) 40 MG tablet, Take 1 tablet (40 mg total) by mouth daily., Disp: 90 tablet, Rfl: 1 .  hydrALAZINE (APRESOLINE) 10 MG tablet, Take 2 tablets (20 mg total) by mouth 3  (three) times daily., Disp: 540 tablet, Rfl: 1 .  ondansetron (ZOFRAN) 4 MG tablet, TAKE 1 TABLET BY MOUTH EVERY 8 HOURS AS NEEDED FOR NAUSEA OR VOMITING, Disp: 20 tablet, Rfl: 0 .  pantoprazole (PROTONIX) 40 MG tablet, 1 tablet daily, Disp: 90 tablet, Rfl: 1 .  pravastatin (PRAVACHOL) 80 MG tablet, Take 1 tablet (80 mg total) by mouth daily., Disp: 90 tablet, Rfl: 1 .  Tetrahydrozoline HCl (VISINE OP), Place 2 drops into both eyes daily as needed (dry eyes)., Disp: , Rfl:    Allergies  Allergen Reactions  . Losartan Anaphylaxis  . Sulfa Antibiotics Anaphylaxis and Swelling  . Codeine Nausea And Vomiting  . Fish Allergy Other (See Comments)    "sick as a dog"  . Other Other (See Comments)    Bologna - vomiting  . Spironolactone Other (See Comments)    Acute kidney injury  . Vancomycin Other (See Comments)    "drives me crazy"  . Penicillins Rash    Has patient had a PCN reaction causing immediate rash, facial/tongue/throat swelling, SOB or lightheadedness with hypotension: Unknown Has patient had  a PCN reaction causing severe rash involving mucus membranes or skin necrosis: Unknown Has patient had a PCN reaction that required hospitalization: No Has patient had a PCN reaction occurring within the last 10 years: No Childhood reaction. If all of the above answers are "NO", then may proceed with Cephalosporin use.    Past Medical History:  Diagnosis Date  . Breast cancer ([place])    Adenocarcinoma  . Chronic edema   . CKD (chronic kidney disease) stage 3, GFR 30-59 ml/min ([place]) 10/22/2018  . Coronary atherosclerosis of native coronary artery    Nonobstructive at catheterization 2003, LVEF normal   . Essential hypertension   . GERD (gastroesophageal reflux disease) 07/06/2013  . Idiopathic peripheral neuropathy 10/06/2013  . Mixed hyperlipidemia   . Occlusion and stenosis of left carotid artery 10/22/2017  . Renal artery stenosis ([facility])    BMS bilateally 2003, PTCA bilaterally 2005 with  restenosis  . Sleep apnea    Stop Bang score of 4  . Stroke ([place])   . Symptomatic varicose veins, right 10/16/2018    Past Surgical History:  Procedure Laterality Date  . APPENDECTOMY    . CATARACT EXTRACTION W/PHACO  03/29/2011   Procedure: CATARACT EXTRACTION PHACO AND INTRAOCULAR LENS PLACEMENT (IOC);  Surgeon: Tonny Branch, MD;  Location: [facility];  Service: Ophthalmology;  Laterality: Right;  CDE:12.82  . CATARACT EXTRACTION W/PHACO  04/16/2011   Procedure: CATARACT EXTRACTION PHACO AND INTRAOCULAR LENS PLACEMENT (IOC);  Surgeon: Tonny Branch, MD;  Location: [facility];  Service: Ophthalmology;  Laterality: Left;  CDE: 11.54  . CESAREAN SECTION     x2  . CHOLECYSTECTOMY  2018  . HEMORRHOID SURGERY    . RENAL ANGIOGRAPHY N/A 05/20/2019   Procedure: RENAL ANGIOGRAPHY;  Surgeon: Wellington Hampshire, MD;  Location: [facility];  Service: Cardiovascular;  Laterality: N/A;  . RENAL INTERVENTION  05/20/2019   Procedure: RENAL INTERVENTION;  Surgeon: Wellington Hampshire, MD;  Location: [facility];  Service: Cardiovascular;;  . Right breast lumpectomy    . ROTATOR CUFF REPAIR  2008  . UPPER EXTREMITY ANGIOGRAPHY N/A 11/27/2017   Procedure: UPPER EXTREMITY ANGIOGRAPHY;  Surgeon: Marty Heck, MD;  Location: [facility];  Service: Cardiovascular;  Laterality: N/A;    Social History   Socioeconomic History  . Marital status: Widowed    Spouse name: Not on file  . Number of children: 2  . Years of education: 12th  . Highest education level: 12th grade  Occupational History    Employer: RETIRED    Comment: n/a  Tobacco Use  . Smoking status: Former Smoker    Packs/day: 1.00    Years: 35.00    Pack years: 35.00    Types: Cigarettes    Quit date: 01/22/1998    Years since quitting: 22.3  . Smokeless tobacco: Never Used  . Tobacco comment: tobacco use - no  Vaping Use  . Vaping Use: Never used  Substance and Sexual Activity  . Alcohol use: No  . Drug use: No  . Sexual  activity: Not on file  Other Topics Concern  . Not on file  Social History Narrative   Patient lives at home with family.  Two sons and two granddaughters live with her.   Caffeine use; 2-5 cups daily   Social Determinants of Health   Financial Resource Strain: Not on file  Food Insecurity: Not on file  Transportation Needs: Not on file  Physical Activity: Not on file  Stress: Not on file  Social Connections: Not on file  Intimate Partner Violence: Not on file        Objective:    BP 124/67   Pulse 65   Temp (!) 97.2 F (36.2 C) (Temporal)   Ht 5' 3.5" (1.613 m)   Wt 131 lb 3.2 oz (59.5 kg)   SpO2 95%   BMI 22.88 kg/m   Wt Readings from Last 3 Encounters:  05/11/20 131 lb 3.2 oz (59.5 kg)  03/04/20 133 lb (60.3 kg)  02/25/20 131 lb (59.4 kg)   Physical Exam Vitals reviewed.  Constitutional:      General: She is not in acute distress.    Appearance: Normal appearance. She is normal weight. She is not ill-appearing, toxic-appearing or diaphoretic.  HENT:     Head: Normocephalic and atraumatic.  Eyes:     General: No scleral icterus.       Right eye: No discharge.        Left eye: No discharge.     Conjunctiva/sclera: Conjunctivae normal.  Cardiovascular:     Rate and Rhythm: Normal rate and regular rhythm.     Heart sounds: Normal heart sounds. No murmur heard. No friction rub. No gallop.   Pulmonary:     Effort: Pulmonary effort is normal. No respiratory distress.     Breath sounds: Normal breath sounds. No stridor. No wheezing, rhonchi or rales.  Musculoskeletal:        General: Normal range of motion.     Cervical back: Normal range of motion.     Right lower leg: Edema present.     Left lower leg: Edema present.  Skin:    General: Skin is warm and dry.     Capillary Refill: Capillary refill takes less than 2 seconds.  Neurological:     General: No focal deficit present.     Mental Status: She is alert and oriented to person, place, and time. Mental  status is at baseline.  Psychiatric:        Mood and Affect: Mood normal.        Behavior: Behavior normal.        Thought Content: Thought content normal.        Judgment: Judgment normal.     No results found for: TSH Lab Results  Component Value Date   WBC 7.3 05/12/2019   HGB 14.0 05/12/2019   HCT 40.9 05/12/2019   MCV 87 05/12/2019   PLT 244 05/12/2019   Lab Results  Component Value Date   NA 138 05/12/2019   K 3.9 05/12/2019   CO2 23 05/12/2019   GLUCOSE 106 (H) 05/12/2019   BUN 18 05/12/2019   CREATININE 1.13 (H) 05/12/2019   BILITOT 0.6 10/22/2018   ALKPHOS 85 10/22/2018   AST 22 10/22/2018   ALT 13 10/22/2018   PROT 6.7 10/22/2018   ALBUMIN 4.5 10/22/2018   CALCIUM 10.1 05/12/2019   ANIONGAP 6 06/18/2016   Lab Results  Component Value Date   CHOL 177 04/13/2019   Lab Results  Component Value Date   HDL 85 04/13/2019   Lab Results  Component Value Date   LDLCALC 75 04/13/2019   Lab Results  Component Value Date   TRIG 99 04/13/2019   Lab Results  Component Value Date   CHOLHDL 2.1 04/13/2019   No results found for: HGBA1C

## 2020-05-12 LAB — CMP14+EGFR
ALT: 14 IU/L (ref 0–32)
AST: 21 IU/L (ref 0–40)
Albumin/Globulin Ratio: 2 (ref 1.2–2.2)
Albumin: 4.9 g/dL — ABNORMAL HIGH (ref 3.7–4.7)
Alkaline Phosphatase: 60 IU/L (ref 44–121)
BUN/Creatinine Ratio: 13 (ref 12–28)
BUN: 15 mg/dL (ref 8–27)
Bilirubin Total: 0.6 mg/dL (ref 0.0–1.2)
CO2: 24 mmol/L (ref 20–29)
Calcium: 9.5 mg/dL (ref 8.7–10.3)
Chloride: 97 mmol/L (ref 96–106)
Creatinine, Ser: 1.15 mg/dL — ABNORMAL HIGH (ref 0.57–1.00)
Globulin, Total: 2.5 g/dL (ref 1.5–4.5)
Glucose: 112 mg/dL — ABNORMAL HIGH (ref 65–99)
Potassium: 3.5 mmol/L (ref 3.5–5.2)
Sodium: 140 mmol/L (ref 134–144)
Total Protein: 7.4 g/dL (ref 6.0–8.5)
eGFR: 49 mL/min/{1.73_m2} — ABNORMAL LOW (ref >59)

## 2020-05-12 LAB — THYROID PANEL WITH TSH
Free Thyroxine Index: 1.8 (ref 1.2–4.9)
T3 Uptake Ratio: 22 % — ABNORMAL LOW (ref 24–39)
T4, Total: 8.4 ug/dL (ref 4.5–12.0)
TSH: 2 u[IU]/mL (ref 0.450–4.500)

## 2020-05-12 LAB — LIPID PANEL
Chol/HDL Ratio: 2.2 ratio (ref 0.0–4.4)
Cholesterol, Total: 198 mg/dL (ref 100–199)
HDL: 89 mg/dL (ref >39)
LDL Chol Calc (NIH): 87 mg/dL (ref 0–99)
Triglycerides: 133 mg/dL (ref 0–149)
VLDL Cholesterol Cal: 22 mg/dL (ref 5–40)

## 2020-05-12 LAB — VITAMIN D 25 HYDROXY (VIT D DEFICIENCY, FRACTURES): Vit D, 25-Hydroxy: 27.4 ng/mL — ABNORMAL LOW (ref 30.0–100.0)

## 2020-06-03 ENCOUNTER — Other Ambulatory Visit: Payer: Medicare Other

## 2020-06-03 ENCOUNTER — Other Ambulatory Visit: Payer: Self-pay

## 2020-06-24 ENCOUNTER — Ambulatory Visit (INDEPENDENT_AMBULATORY_CARE_PROVIDER_SITE_OTHER): Payer: Medicare Other

## 2020-06-24 VITALS — Ht 64.0 in | Wt 128.0 lb

## 2020-06-24 DIAGNOSIS — Z1231 Encounter for screening mammogram for malignant neoplasm of breast: Secondary | ICD-10-CM

## 2020-06-24 DIAGNOSIS — Z0000 Encounter for general adult medical examination without abnormal findings: Secondary | ICD-10-CM | POA: Diagnosis not present

## 2020-06-24 NOTE — Progress Notes (Signed)
Subjective:   Tamara King is a 80 y.o. female who presents for Medicare Annual (Subsequent) preventive examination.  Virtual Visit via Telephone Note  I connected with  FIANA GLADU on 06/24/20 at  8:15 AM EDT by telephone and verified that I am speaking with the correct person using two identifiers.  Location: Patient: Home Provider: [facility] Persons participating in the virtual visit: patient/Nurse Health Advisor   I discussed the limitations, risks, security and privacy concerns of performing an evaluation and management service by telephone and the availability of in person appointments. The patient expressed understanding and agreed to proceed.  Interactive audio and video telecommunications were attempted between this nurse and patient, however failed, due to patient having technical difficulties OR patient did not have access to video capability.  We continued and completed visit with audio only.  Some vital signs may be absent or patient reported.   Willa Brocks E Nial Hawe, LPN   Review of Systems     Cardiac Risk Factors include: advanced age (>81men, >27 women);dyslipidemia;hypertension;sedentary lifestyle     Objective:    Today's Vitals   06/24/20 0806  Weight: 128 lb (58.1 kg)  Height: 5\' 4"  (1.626 m)   Body mass index is 21.97 kg/m.  Advanced Directives 06/24/2020 06/24/2019 05/20/2019 11/28/2017 11/27/2017 06/17/2016 04/16/2011  Does Patient Have a Medical Advance Directive? No No No No No No Patient does not have advance directive  Type of Advance Directive - - - - - - -  Copy of [place] in Chart? - - - - - - -  Would patient like information on creating a medical advance directive? No - Patient declined No - Patient declined No - Patient declined - No - Patient declined Yes (ED - Information included in AVS) -  Pre-existing out of facility DNR order (yellow form or pink MOST form) - - - - - - No    Current Medications (verified) Outpatient  Encounter Medications as of 06/24/2020  Medication Sig  . acetaminophen (TYLENOL) 500 MG tablet Take 500 mg by mouth every 6 (six) hours as needed for moderate pain.  Marland Kitchen albuterol (VENTOLIN HFA) 108 (90 Base) MCG/ACT inhaler INHALE 2 PUFFS BY MOUTH EVERY 6 HOURS AS NEEDED FOR WHEEZING AND FOR SHORTNESS OF BREATH  . alendronate (FOSAMAX) 70 MG tablet Take 1 tablet (70 mg total) by mouth every 7 (seven) days. Take with a full glass of water on an empty stomach.  Marland Kitchen amLODipine (NORVASC) 10 MG tablet Take 1 tablet by mouth once daily  . aspirin EC 81 MG tablet Take 81 mg by mouth daily.  . carvedilol (COREG) 25 MG tablet Take 1 tablet by mouth twice daily  . cholecalciferol (VITAMIN D3) 25 MCG (1000 UNIT) tablet Take 1,000 Units by mouth daily.  . clopidogrel (PLAVIX) 75 MG tablet Take 1 tablet by mouth once daily  . EQ ALLERGY RELIEF, CETIRIZINE, 10 MG tablet Take 1 tablet by mouth once daily  . famotidine (PEPCID) 20 MG tablet Take 1 tablet by mouth once daily  . fluticasone (FLONASE) 50 MCG/ACT nasal spray Place 2 sprays into the nose daily as needed for allergies (congestion.).   Marland Kitchen furosemide (LASIX) 20 MG tablet Take 20 mg by mouth every evening. Takes in addition to 40 mg in the AM.  . furosemide (LASIX) 40 MG tablet Take 1 tablet (40 mg total) by mouth daily.  . hydrALAZINE (APRESOLINE) 10 MG tablet Take 2 tablets (20 mg total) by mouth 3 (three)  times daily.  . ondansetron (ZOFRAN) 4 MG tablet TAKE 1 TABLET BY MOUTH EVERY 8 HOURS AS NEEDED FOR NAUSEA OR VOMITING  . pantoprazole (PROTONIX) 40 MG tablet 1 tablet daily  . pravastatin (PRAVACHOL) 80 MG tablet Take 1 tablet (80 mg total) by mouth daily.  . Tetrahydrozoline HCl (VISINE OP) Place 2 drops into both eyes daily as needed (dry eyes).   No facility-administered encounter medications on file as of 06/24/2020.    Allergies (verified) Losartan, Sulfa antibiotics, Codeine, Fish allergy, Other, Spironolactone, Vancomycin, and Penicillins    History: Past Medical History:  Diagnosis Date  . Breast cancer ([place])    Adenocarcinoma  . Chronic edema   . CKD (chronic kidney disease) stage 3, GFR 30-59 ml/min ([place]) 10/22/2018  . Coronary atherosclerosis of native coronary artery    Nonobstructive at catheterization 2003, LVEF normal   . Essential hypertension   . GERD (gastroesophageal reflux disease) 07/06/2013  . Idiopathic peripheral neuropathy 10/06/2013  . Mixed hyperlipidemia   . Occlusion and stenosis of left carotid artery 10/22/2017  . Renal artery stenosis ([facility])    BMS bilateally 2003, PTCA bilaterally 2005 with restenosis  . Sleep apnea    Stop Bang score of 4  . Stroke ([place])   . Symptomatic varicose veins, right 10/16/2018   Past Surgical History:  Procedure Laterality Date  . APPENDECTOMY    . CATARACT EXTRACTION W/PHACO  03/29/2011   Procedure: CATARACT EXTRACTION PHACO AND INTRAOCULAR LENS PLACEMENT (IOC);  Surgeon: Tonny Branch, MD;  Location: [facility];  Service: Ophthalmology;  Laterality: Right;  CDE:12.82  . CATARACT EXTRACTION W/PHACO  04/16/2011   Procedure: CATARACT EXTRACTION PHACO AND INTRAOCULAR LENS PLACEMENT (IOC);  Surgeon: Tonny Branch, MD;  Location: [facility];  Service: Ophthalmology;  Laterality: Left;  CDE: 11.54  . CESAREAN SECTION     x2  . CHOLECYSTECTOMY  2018  . HEMORRHOID SURGERY    . RENAL ANGIOGRAPHY N/A 05/20/2019   Procedure: RENAL ANGIOGRAPHY;  Surgeon: Wellington Hampshire, MD;  Location: [facility];  Service: Cardiovascular;  Laterality: N/A;  . RENAL INTERVENTION  05/20/2019   Procedure: RENAL INTERVENTION;  Surgeon: Wellington Hampshire, MD;  Location: [facility];  Service: Cardiovascular;;  . Right breast lumpectomy    . ROTATOR CUFF REPAIR  2008  . UPPER EXTREMITY ANGIOGRAPHY N/A 11/27/2017   Procedure: UPPER EXTREMITY ANGIOGRAPHY;  Surgeon: Marty Heck, MD;  Location: [facility];  Service: Cardiovascular;  Laterality: N/A;   Family History  Problem Relation  Age of Onset  . Heart failure Mother   . Stroke Mother 66  . Lung cancer Father   . Diabetes Father   . Diabetes Sister   . Brain cancer Sister   . Brain cancer Brother   . Diabetes Brother   . Diabetes Brother   . COPD Sister   . Diabetes Son   . Thyroid cancer Son   . Diabetes Son   . Anesthesia problems Neg Hx   . Hypotension Neg Hx   . Malignant hyperthermia Neg Hx   . Pseudochol deficiency Neg Hx    Social History   Socioeconomic History  . Marital status: Widowed    Spouse name: Not on file  . Number of children: 2  . Years of education: 12th  . Highest education level: 12th grade  Occupational History    Employer: RETIRED    Comment: n/a  Tobacco Use  . Smoking status: Former Smoker  Packs/day: 1.00    Years: 35.00    Pack years: 35.00    Types: Cigarettes    Quit date: 01/22/1998    Years since quitting: 22.4  . Smokeless tobacco: Never Used  . Tobacco comment: tobacco use - no  Vaping Use  . Vaping Use: Never used  Substance and Sexual Activity  . Alcohol use: No  . Drug use: No  . Sexual activity: Not on file  Other Topics Concern  . Not on file  Social History Narrative   Patient lives at home with family.  Two sons and two granddaughters live with her.   Caffeine use; 2-5 cups daily   Oldest son has cancer   Social Determinants of Health   Financial Resource Strain: Low Risk   . Difficulty of Paying Living Expenses: Not hard at all  Food Insecurity: No Food Insecurity  . Worried About Charity fundraiser in the Last Year: Never true  . Ran Out of Food in the Last Year: Never true  Transportation Needs: No Transportation Needs  . Lack of Transportation (Medical): No  . Lack of Transportation (Non-Medical): No  Physical Activity: Inactive  . Days of Exercise per Week: 0 days  . Minutes of Exercise per Session: 0 min  Stress: Stress Concern Present  . Feeling of Stress : Rather much  Social Connections: Socially Isolated  . Frequency of  Communication with Friends and Family: More than three times a week  . Frequency of Social Gatherings with Friends and Family: Once a week  . Attends Religious Services: Never  . Active Member of Clubs or Organizations: No  . Attends Archivist Meetings: Never  . Marital Status: Widowed    Tobacco Counseling Counseling given: Not Answered Comment: tobacco use - no   Clinical Intake:  Pre-visit preparation completed: Yes  Pain : No/denies pain     BMI - recorded: 21.97 Nutritional Status: BMI of 19-24  Normal Nutritional Risks: None Diabetes: No  How often do you need to have someone help you when you read instructions, pamphlets, or other written materials from your doctor or pharmacy?: 1 - Never  Diabetic? No  Interpreter Needed?: No  Information entered by :: Teion Ballin, LPN   Activities of Daily Living In your present state of health, do you have any difficulty performing the following activities: 06/24/2020  Hearing? N  Vision? N  Difficulty concentrating or making decisions? Y  Walking or climbing stairs? N  Dressing or bathing? N  Doing errands, shopping? N  Preparing Food and eating ? N  Using the Toilet? N  In the past six months, have you accidently leaked urine? Y  Comment wears pads for protection  Do you have problems with loss of bowel control? N  Managing your Medications? N  Managing your Finances? N  Housekeeping or managing your Housekeeping? N  Some recent data might be hidden    Patient Care Team: Loman Brooklyn, FNP as PCP - General (Family Medicine) Satira Sark, MD as PCP - Cardiology (Cardiology) Liana Gerold, MD as Consulting Physician (Nephrology) Wellington Hampshire, MD as Consulting Physician (Cardiology)  Indicate any recent Medical Services you may have received from other than [facility] providers in the past year (date may be approximate).     Assessment:   This is a routine wellness examination for  [place].  Hearing/Vision screen  Hearing Screening   125Hz  250Hz  500Hz  1000Hz  2000Hz  3000Hz  4000Hz  6000Hz  8000Hz   Right ear:  Left ear:           Comments: Denies hearing difficulties   Vision Screening Comments: Wears eyeglasses - up to date with annual eye exams with [facility] in [place] issues and exercise activities discussed: Current Exercise Habits: The patient does not participate in regular exercise at present, Exercise limited by: orthopedic condition(s)  Goals Addressed            This Visit's Progress   . Exercise 3x per week (30 min per time)      . Patient Stated   On track    06/24/2019 AWV Goal: Keep All Scheduled Appointments  Over the next year, patient will attend all scheduled appointments with their PCP and any specialists that they see.         Depression Screen PHQ 2/9 Scores 06/24/2020 05/11/2020 02/11/2020 10/20/2019 07/21/2019 06/24/2019 04/17/2019  PHQ - 2 Score 2 0 0 0 0 2 0  PHQ- 9 Score 4 - - - - 5 -    Fall Risk Fall Risk  06/24/2020 05/11/2020 02/11/2020 10/20/2019 07/21/2019  Falls in the past year? 0 0 0 0 0  Number falls in past yr: 0 - - - -  Injury with Fall? 0 - - - -  Risk for fall due to : Orthopedic patient;Impaired vision - - - -  Follow up Falls prevention discussed - - - -    FALL RISK PREVENTION PERTAINING TO THE HOME:  Any stairs in or around the home? No  If so, are there any without handrails? No  Home free of loose throw rugs in walkways, pet beds, electrical cords, etc? Yes  Adequate lighting in your home to reduce risk of falls? Yes   ASSISTIVE DEVICES UTILIZED TO PREVENT FALLS:  Life alert? No  Use of a cane, walker or w/c? No  Grab bars in the bathroom? No  Shower chair or bench in shower? Yes  Elevated toilet seat or a handicapped toilet? No   TIMED UP AND GO:  Was the test performed? No . Telephonic visit.  Cognitive Function:     6CIT Screen 06/24/2020 06/24/2019  What Year? 0 points 0 points  What  month? 0 points 0 points  What time? 0 points 0 points  Count back from 20 0 points 0 points  Months in reverse 0 points 0 points  Repeat phrase 0 points 0 points  Total Score 0 0    Immunizations Immunization History  Administered Date(s) Administered  . Fluad Quad(high Dose 65+) 10/20/2019  . Influenza, High Dose Seasonal PF 11/01/2014, 11/11/2015, 12/05/2015, 10/07/2018  . Influenza,trivalent, recombinat, inj, PF 12/07/2013, 10/23/2016  . Moderna Sars-Covid-2 Vaccination 04/20/2019, 05/18/2019, 01/13/2020  . Pneumococcal Conjugate-13 01/14/2019  . Pneumococcal Polysaccharide-23 10/22/2013  . Tdap 07/06/2013, 02/01/2017    TDAP status: Up to date  Flu Vaccine status: Up to date  Pneumococcal vaccine status: Up to date  Covid-19 vaccine status: Completed vaccines  Qualifies for Shingles Vaccine? Yes   Zostavax completed No   Shingrix Completed?: No.    Education has been provided regarding the importance of this vaccine. Patient has been advised to call insurance company to determine out of pocket expense if they have not yet received this vaccine. Advised may also receive vaccine at local pharmacy or Health Dept. Verbalized acceptance and understanding.  Screening Tests Health Maintenance  Topic Date Due  . Pneumococcal Vaccine 47-45 Years old (1 of 4 - PCV13) Never done  . Zoster Vaccines- Shingrix (1  of 2) Never done  . MAMMOGRAM  10/22/2018  . COVID-19 Vaccine (4 - Booster for Moderna series) 04/12/2020  . INFLUENZA VACCINE  08/22/2020  . DEXA SCAN  12/03/2021  . TETANUS/TDAP  02/02/2027  . Hepatitis C Screening  Completed  . PNA vac Low Risk Adult  Completed  . HPV VACCINES  Aged Out    Health Maintenance  Health Maintenance Due  Topic Date Due  . Pneumococcal Vaccine 43-47 Years old (1 of 4 - PCV13) Never done  . Zoster Vaccines- Shingrix (1 of 2) Never done  . MAMMOGRAM  10/22/2018  . COVID-19 Vaccine (4 - Booster for Moderna series) 04/12/2020     Colorectal cancer screening: No longer required.   Mammogram status: Ordered 06/24/20. Pt provided with contact info and advised to call to schedule appt.   Bone Density status: Completed 12/04/2019. Results reflect: Bone density results: OSTEOPOROSIS. Repeat every 2 years.  Lung Cancer Screening: (Low Dose CT Chest recommended if Age 76-80 years, 30 pack-year currently smoking OR have quit w/in 15years.) does not qualify.  Additional Screening:  Hepatitis C Screening: does not qualify; Completed 04/08/2019  Vision Screening: Recommended annual ophthalmology exams for early detection of glaucoma and other disorders of the eye. Is the patient up to date with their annual eye exam?  Yes  Who is the provider or what is the name of the office in which the patient attends annual eye exams? [facility] in [place] If pt is not established with a provider, would they like to be referred to a provider to establish care? No .   Dental Screening: Recommended annual dental exams for proper oral hygiene  Community Resource Referral / Chronic Care Management: CRR required this visit?  No   CCM required this visit?  No      Plan:     I have personally reviewed and noted the following in the patient's chart:   . Medical and social history . Use of alcohol, tobacco or illicit drugs  . Current medications and supplements including opioid prescriptions.  . Functional ability and status . Nutritional status . Physical activity . Advanced directives . List of other physicians . Hospitalizations, surgeries, and ER visits in previous 12 months . Vitals . Screenings to include cognitive, depression, and falls . Referrals and appointments  In addition, I have reviewed and discussed with patient certain preventive protocols, quality metrics, and best practice recommendations. A written personalized care plan for preventive services as well as general preventive health recommendations were provided  to patient.     Sandrea Hammond, LPN   01/28/4942   Nurse Notes: None

## 2020-06-24 NOTE — Patient Instructions (Signed)
Tamara King , Thank you for taking time to come for your Medicare Wellness Visit. I appreciate your ongoing commitment to your health goals. Please review the following plan we discussed and let me know if I can assist you in the future.   Screening recommendations/referrals: Colonoscopy: No longer required Mammogram: Done 2020- ordered repeat today (repeat every year) Bone Density: Done 12/04/2019 - Repeat every 2 years Recommended yearly ophthalmology/optometry visit for glaucoma screening and checkup Recommended yearly dental visit for hygiene and checkup  Vaccinations: Influenza vaccine: Done 10/20/2019 - Repeat annually Pneumococcal vaccine: Done 10/22/2013 & 01/14/2019 Tdap vaccine: Done 02/01/2017 - Repeat in 10 years Shingles vaccine: Due Shingrix discussed. Please contact your pharmacy for coverage information.    Covid-19: Done 04/20/19, 05/18/19, & 01/13/2020  Advanced directives: Advance directive discussed with you today. Even though you declined this today, please call our office should you change your mind, and we can give you the proper paperwork for you to fill out.  Conditions/risks identified: Aim for 30 minutes of exercise or brisk walking each day, drink 6-8 glasses of water and eat lots of fruits and vegetables.  Next appointment: Follow up in one year for your annual wellness visit    Preventive Care 65 Years and Older, Female Preventive care refers to lifestyle choices and visits with your health care provider that can promote health and wellness. What does preventive care include?  A yearly physical exam. This is also called an annual well check.  Dental exams once or twice a year.  Routine eye exams. Ask your health care provider how often you should have your eyes checked.  Personal lifestyle choices, including:  Daily care of your teeth and gums.  Regular physical activity.  Eating a healthy diet.  Avoiding tobacco and drug use.  Limiting alcohol  use.  Practicing safe sex.  Taking low-dose aspirin every day.  Taking vitamin and mineral supplements as recommended by your health care provider. What happens during an annual well check? The services and screenings done by your health care provider during your annual well check will depend on your age, overall health, lifestyle risk factors, and family history of disease. Counseling  Your health care provider may ask you questions about your:  Alcohol use.  Tobacco use.  Drug use.  Emotional well-being.  Home and relationship well-being.  Sexual activity.  Eating habits.  History of falls.  Memory and ability to understand (cognition).  Work and work Statistician.  Reproductive health. Screening  You may have the following tests or measurements:  Height, weight, and BMI.  Blood pressure.  Lipid and cholesterol levels. These may be checked every 5 years, or more frequently if you are over 12 years old.  Skin check.  Lung cancer screening. You may have this screening every year starting at age 23 if you have a 30-pack-year history of smoking and currently smoke or have quit within the past 15 years.  Fecal occult blood test (FOBT) of the stool. You may have this test every year starting at age 14.  Flexible sigmoidoscopy or colonoscopy. You may have a sigmoidoscopy every 5 years or a colonoscopy every 10 years starting at age 15.  Hepatitis C blood test.  Hepatitis B blood test.  Sexually transmitted disease (STD) testing.  Diabetes screening. This is done by checking your blood sugar (glucose) after you have not eaten for a while (fasting). You may have this done every 1-3 years.  Bone density scan. This is done to screen  for osteoporosis. You may have this done starting at age 55.  Mammogram. This may be done every 1-2 years. Talk to your health care provider about how often you should have regular mammograms. Talk with your health care provider about  your test results, treatment options, and if necessary, the need for more tests. Vaccines  Your health care provider may recommend certain vaccines, such as:  Influenza vaccine. This is recommended every year.  Tetanus, diphtheria, and acellular pertussis (Tdap, Td) vaccine. You may need a Td booster every 10 years.  Zoster vaccine. You may need this after age 43.  Pneumococcal 13-valent conjugate (PCV13) vaccine. One dose is recommended after age 32.  Pneumococcal polysaccharide (PPSV23) vaccine. One dose is recommended after age 61. Talk to your health care provider about which screenings and vaccines you need and how often you need them. This information is not intended to replace advice given to you by your health care provider. Make sure you discuss any questions you have with your health care provider. Document Released: 02/04/2015 Document Revised: 09/28/2015 Document Reviewed: 11/09/2014 Elsevier Interactive Patient Education  2017 [place] Prevention in the Home Falls can cause injuries. They can happen to people of all ages. There are many things you can do to make your home safe and to help prevent falls. What can I do on the outside of my home?  Regularly fix the edges of walkways and driveways and fix any cracks.  Remove anything that might make you trip as you walk through a door, such as a raised step or threshold.  Trim any bushes or trees on the path to your home.  Use bright outdoor lighting.  Clear any walking paths of anything that might make someone trip, such as rocks or tools.  Regularly check to see if handrails are loose or broken. Make sure that both sides of any steps have handrails.  Any raised decks and porches should have guardrails on the edges.  Have any leaves, snow, or ice cleared regularly.  Use sand or salt on walking paths during winter.  Clean up any spills in your garage right away. This includes oil or grease spills. What  can I do in the bathroom?  Use night lights.  Install grab bars by the toilet and in the tub and shower. Do not use towel bars as grab bars.  Use non-skid mats or decals in the tub or shower.  If you need to sit down in the shower, use a plastic, non-slip stool.  Keep the floor dry. Clean up any water that spills on the floor as soon as it happens.  Remove soap buildup in the tub or shower regularly.  Attach bath mats securely with double-sided non-slip rug tape.  Do not have throw rugs and other things on the floor that can make you trip. What can I do in the bedroom?  Use night lights.  Make sure that you have a light by your bed that is easy to reach.  Do not use any sheets or blankets that are too big for your bed. They should not hang down onto the floor.  Have a firm chair that has side arms. You can use this for support while you get dressed.  Do not have throw rugs and other things on the floor that can make you trip. What can I do in the kitchen?  Clean up any spills right away.  Avoid walking on wet floors.  Keep items that you  use a lot in easy-to-reach places.  If you need to reach something above you, use a strong step stool that has a grab bar.  Keep electrical cords out of the way.  Do not use floor polish or wax that makes floors slippery. If you must use wax, use non-skid floor wax.  Do not have throw rugs and other things on the floor that can make you trip. What can I do with my stairs?  Do not leave any items on the stairs.  Make sure that there are handrails on both sides of the stairs and use them. Fix handrails that are broken or loose. Make sure that handrails are as long as the stairways.  Check any carpeting to make sure that it is firmly attached to the stairs. Fix any carpet that is loose or worn.  Avoid having throw rugs at the top or bottom of the stairs. If you do have throw rugs, attach them to the floor with carpet tape.  Make sure  that you have a light switch at the top of the stairs and the bottom of the stairs. If you do not have them, ask someone to add them for you. What else can I do to help prevent falls?  Wear shoes that:  Do not have high heels.  Have rubber bottoms.  Are comfortable and fit you well.  Are closed at the toe. Do not wear sandals.  If you use a stepladder:  Make sure that it is fully opened. Do not climb a closed stepladder.  Make sure that both sides of the stepladder are locked into place.  Ask someone to hold it for you, if possible.  Clearly mark and make sure that you can see:  Any grab bars or handrails.  First and last steps.  Where the edge of each step is.  Use tools that help you move around (mobility aids) if they are needed. These include:  Canes.  Walkers.  Scooters.  Crutches.  Turn on the lights when you go into a dark area. Replace any light bulbs as soon as they burn out.  Set up your furniture so you have a clear path. Avoid moving your furniture around.  If any of your floors are uneven, fix them.  If there are any pets around you, be aware of where they are.  Review your medicines with your doctor. Some medicines can make you feel dizzy. This can increase your chance of falling. Ask your doctor what other things that you can do to help prevent falls. This information is not intended to replace advice given to you by your health care provider. Make sure you discuss any questions you have with your health care provider. Document Released: 11/04/2008 Document Revised: 06/16/2015 Document Reviewed: 02/12/2014 Elsevier Interactive Patient Education  2017 [employer].

## 2020-07-01 ENCOUNTER — Other Ambulatory Visit: Payer: Self-pay

## 2020-07-01 ENCOUNTER — Other Ambulatory Visit: Payer: Medicare Other

## 2020-07-14 ENCOUNTER — Other Ambulatory Visit: Payer: Self-pay | Admitting: Family Medicine

## 2020-07-14 ENCOUNTER — Other Ambulatory Visit: Payer: Self-pay

## 2020-07-14 DIAGNOSIS — J302 Other seasonal allergic rhinitis: Secondary | ICD-10-CM

## 2020-07-14 DIAGNOSIS — K219 Gastro-esophageal reflux disease without esophagitis: Secondary | ICD-10-CM

## 2020-07-14 MED ORDER — ALENDRONATE SODIUM 70 MG PO TABS: 70.0000 mg | ORAL_TABLET | ORAL | 5 refills | Status: DC

## 2020-07-27 ENCOUNTER — Other Ambulatory Visit: Payer: Self-pay | Admitting: Family Medicine

## 2020-07-27 DIAGNOSIS — Z8673 Personal history of transient ischemic attack (TIA), and cerebral infarction without residual deficits: Secondary | ICD-10-CM

## 2020-07-27 DIAGNOSIS — I6522 Occlusion and stenosis of left carotid artery: Secondary | ICD-10-CM

## 2020-08-01 ENCOUNTER — Other Ambulatory Visit: Payer: Self-pay | Admitting: Family Medicine

## 2020-08-01 DIAGNOSIS — I10 Essential (primary) hypertension: Secondary | ICD-10-CM

## 2020-08-11 ENCOUNTER — Ambulatory Visit (INDEPENDENT_AMBULATORY_CARE_PROVIDER_SITE_OTHER): Payer: Medicare Other | Admitting: Family Medicine

## 2020-08-11 ENCOUNTER — Other Ambulatory Visit: Payer: Self-pay

## 2020-08-11 ENCOUNTER — Encounter: Payer: Self-pay | Admitting: Family Medicine

## 2020-08-11 VITALS — BP 149/76 | HR 66 | Temp 97.3°F | Ht 64.0 in | Wt 130.2 lb

## 2020-08-11 DIAGNOSIS — Z8673 Personal history of transient ischemic attack (TIA), and cerebral infarction without residual deficits: Secondary | ICD-10-CM

## 2020-08-11 DIAGNOSIS — M150 Primary generalized (osteo)arthritis: Secondary | ICD-10-CM

## 2020-08-11 DIAGNOSIS — M810 Age-related osteoporosis without current pathological fracture: Secondary | ICD-10-CM

## 2020-08-11 DIAGNOSIS — M159 Polyosteoarthritis, unspecified: Secondary | ICD-10-CM

## 2020-08-11 DIAGNOSIS — I10 Essential (primary) hypertension: Secondary | ICD-10-CM

## 2020-08-11 DIAGNOSIS — N1832 Chronic kidney disease, stage 3b: Secondary | ICD-10-CM

## 2020-08-11 DIAGNOSIS — R600 Localized edema: Secondary | ICD-10-CM

## 2020-08-11 DIAGNOSIS — E785 Hyperlipidemia, unspecified: Secondary | ICD-10-CM | POA: Diagnosis not present

## 2020-08-11 DIAGNOSIS — I83891 Varicose veins of right lower extremities with other complications: Secondary | ICD-10-CM | POA: Diagnosis not present

## 2020-08-11 DIAGNOSIS — I701 Atherosclerosis of renal artery: Secondary | ICD-10-CM

## 2020-08-11 DIAGNOSIS — K219 Gastro-esophageal reflux disease without esophagitis: Secondary | ICD-10-CM

## 2020-08-11 DIAGNOSIS — E559 Vitamin D deficiency, unspecified: Secondary | ICD-10-CM

## 2020-08-11 DIAGNOSIS — M8949 Other hypertrophic osteoarthropathy, multiple sites: Secondary | ICD-10-CM

## 2020-08-11 MED ORDER — AMLODIPINE BESYLATE 5 MG PO TABS
5.0000 mg | ORAL_TABLET | Freq: Every day | ORAL | 1 refills | Status: DC
Start: 2020-08-11 — End: 2021-02-15

## 2020-08-11 MED ORDER — HYDRALAZINE HCL 10 MG PO TABS: 20.0000 mg | ORAL_TABLET | Freq: Three times a day (TID) | ORAL | 5 refills | Status: DC

## 2020-08-11 MED ORDER — FUROSEMIDE 40 MG PO TABS
40.0000 mg | ORAL_TABLET | Freq: Two times a day (BID) | ORAL | 1 refills | Status: DC
Start: 2020-08-11 — End: 2021-02-15

## 2020-08-11 MED ORDER — PANTOPRAZOLE SODIUM 40 MG PO TBEC: DELAYED_RELEASE_TABLET | ORAL | 1 refills | Status: DC

## 2020-08-11 MED ORDER — PRAVASTATIN SODIUM 80 MG PO TABS: 80.0000 mg | ORAL_TABLET | Freq: Every day | ORAL | 1 refills | Status: DC

## 2020-08-11 NOTE — Progress Notes (Signed)
Assessment & Plan:  1. Lower extremity edema Encouraged compression hose and elevating feet throughout the day. Showed pictures of compression hose that zip and velcro as alternatives. Changed prescription of Lasix to reflect nephrology directions so patient does not run out early.  - furosemide (LASIX) 40 MG tablet; Take 1 tablet (40 mg total) by mouth 2 (two) times daily.  Dispense: 180 tablet; Refill: 1  2. Symptomatic varicose veins, right Discussed compression hose would be helpful.  3. Essential (primary) hypertension Well controlled on current regimen.  - hydrALAZINE (APRESOLINE) 10 MG tablet; Take 2 tablets (20 mg total) by mouth 3 (three) times daily.  Dispense: 180 tablet; Refill: 5 - amLODipine (NORVASC) 5 MG tablet; Take 1 tablet (5 mg total) by mouth daily.  Dispense: 90 tablet; Refill: 1  4. Hyperlipidemia, unspecified hyperlipidemia type Well controlled on current regimen.  - pravastatin (PRAVACHOL) 80 MG tablet; Take 1 tablet (80 mg total) by mouth daily.  Dispense: 90 tablet; Refill: 1  5. History of CVA (cerebrovascular accident) On a statin. - pravastatin (PRAVACHOL) 80 MG tablet; Take 1 tablet (80 mg total) by mouth daily.  Dispense: 90 tablet; Refill: 1  6. Gastroesophageal reflux disease, unspecified whether esophagitis present Well controlled on current regimen.  - pantoprazole (PROTONIX) 40 MG tablet; 1 tablet daily  Dispense: 90 tablet; Refill: 1  7. Renal artery stenosis [facility]) Managed by cardiology.  8. Stage 3b chronic kidney disease (HCC) Managed by nephrology.  9. Vitamin D deficiency On a supplement.  10. Age-related osteoporosis without current pathological fracture Well controlled on current regimen.   11. Primary osteoarthritis involving multiple joints Well controlled on current regimen.    Return in about 3 months (around 11/11/2020) for follow-up of chronic medication conditions.  Tamara Boston, MSN, APRN, FNP-C [facility]  Subjective:    Patient ID: Tamara King, female    DOB: 06-Sep-1940, 80 y.o.   MRN: [id number]  Patient Care Team: Tamara Fudge, FNP as PCP - General (Family Medicine) Tamara Sidle, MD as PCP - Cardiology (Cardiology) Tamara Lynn, MD as Consulting Physician (Nephrology) Tamara Ouch, MD as Consulting Physician (Cardiology)   Chief Complaint:  Chief Complaint  Patient presents with   Hypertension   Hyperlipidemia    3 month follow up of chronic medical conditions     Leg Swelling    Bilateral lower leg and feet swelling that has been ongoing.     HPI: Tamara King is a 79 y.o. female presenting on 08/11/2020 for Hypertension, Hyperlipidemia (3 month follow up of chronic medical conditions /), and Leg Swelling (Bilateral lower leg and feet swelling that has been ongoing./)  Renal artery stenosis: Managed by Dr. Kirke King.  Patient had a balloon angioplasty of the left renal artery in-stent restenosis.  Velocities on Doppler showed patent bilateral renal artery stent with no significant restenosis. She is to remain on dual antiplatelet therapy with ASA and Plavix.   Carotid artery disease: managed by Dr. Diona King, cardiologist. Carotid dopplers repeated in March which are stable. LICA occluded as before and only mild RICA stenosis.   Hypertension: managed by Dr. Diona King, cardiologist.   Hyperlipidemia: Patient is taking pravastatin nightly.  Lower extremity edema: taking Lasix 40 mg in the AM and 20 mg in the PM. The last time she saw her nephrologist he decreased her Amlodipine from 10 mg to 5 mg and increased her evening Lasix from 20 mg to 40 mg. She reports she did  decrease her Amlodipine, but did not increase her Lasix as she was afraid she would run out without a new prescription. She does not elevate her feet because she is constantly going and taking care of others. She does not wear compression hose because she reports they hurt.    History of CVA: Patient has continued with clopidogrel.  GERD: Patient is taking both Protonix and famotidine.  Vitamin D deficiency: Patient was diagnosed with vitamin D deficiency by her nephrologist last year.  She is on a supplement.   Osteoporosis: Patient is taking Fosamax.  Osteoarthritis: Patient only takes Tylenol for pain and does not wish to have anything stronger.  Unable to take NSAIDs.  CKD: Managed by Tamara King, nephrologist, who she is scheduled to see again on 06/09/2020.  New complaints: None besides her swelling addressed above.    Social history:  Relevant past medical, surgical, family and social history reviewed and updated as indicated. Interim medical history since our last visit reviewed.  Allergies and medications reviewed and updated.  DATA REVIEWED: CHART IN EPIC  ROS: Negative unless specifically indicated above in HPI.    Current Outpatient Medications:    acetaminophen (TYLENOL) 500 MG tablet, Take 500 mg by mouth every 6 (six) hours as needed for moderate pain., Disp: , Rfl:    albuterol (VENTOLIN HFA) 108 (90 Base) MCG/ACT inhaler, INHALE 2 PUFFS BY MOUTH EVERY 6 HOURS AS NEEDED FOR WHEEZING AND FOR SHORTNESS OF BREATH, Disp: 18 g, Rfl: 2   alendronate (FOSAMAX) 70 MG tablet, Take 1 tablet (70 mg total) by mouth every 7 (seven) days. Take with a full glass of water on an empty stomach., Disp: 4 tablet, Rfl: 5   amLODipine (NORVASC) 10 MG tablet, Take 1 tablet by mouth once daily, Disp: 90 tablet, Rfl: 0   aspirin EC 81 MG tablet, Take 81 mg by mouth daily., Disp: , Rfl:    carvedilol (COREG) 25 MG tablet, Take 1 tablet by mouth twice daily, Disp: 180 tablet, Rfl: 0   cholecalciferol (VITAMIN D3) 25 MCG (1000 UNIT) tablet, Take 1,000 Units by mouth daily., Disp: , Rfl:    clopidogrel (PLAVIX) 75 MG tablet, Take 1 tablet by mouth once daily, Disp: 90 tablet, Rfl: 0   EQ ALLERGY RELIEF, CETIRIZINE, 10 MG tablet, Take 1 tablet by mouth once daily,  Disp: 90 tablet, Rfl: 1   famotidine (PEPCID) 20 MG tablet, Take 1 tablet by mouth once daily, Disp: 90 tablet, Rfl: 0   fluticasone (FLONASE) 50 MCG/ACT nasal spray, Place 2 sprays into the nose daily as needed for allergies (congestion.). , Disp: , Rfl:    furosemide (LASIX) 20 MG tablet, Take 20 mg by mouth every evening. Takes in addition to 40 mg in the AM., Disp: , Rfl:    furosemide (LASIX) 40 MG tablet, Take 1 tablet (40 mg total) by mouth daily., Disp: 90 tablet, Rfl: 1   hydrALAZINE (APRESOLINE) 10 MG tablet, Take 2 tablets (20 mg total) by mouth 3 (three) times daily., Disp: 540 tablet, Rfl: 1   ondansetron (ZOFRAN) 4 MG tablet, TAKE 1 TABLET BY MOUTH EVERY 8 HOURS AS NEEDED FOR NAUSEA OR VOMITING, Disp: 20 tablet, Rfl: 0   pantoprazole (PROTONIX) 40 MG tablet, 1 tablet daily, Disp: 90 tablet, Rfl: 1   pravastatin (PRAVACHOL) 80 MG tablet, Take 1 tablet (80 mg total) by mouth daily., Disp: 90 tablet, Rfl: 1   Tetrahydrozoline HCl (VISINE OP), Place 2 drops into both eyes daily as needed (  dry eyes)., Disp: , Rfl:    Allergies  Allergen Reactions   Losartan Anaphylaxis   Sulfa Antibiotics Anaphylaxis and Swelling   Codeine Nausea And Vomiting   Fish Allergy Other (See Comments)    "sick as a dog"   Other Other (See Comments)    Bologna - vomiting   Spironolactone Other (See Comments)    Acute kidney injury   Vancomycin Other (See Comments)    "drives me crazy"   Penicillins Rash    Has patient had a PCN reaction causing immediate rash, facial/tongue/throat swelling, SOB or lightheadedness with hypotension: Unknown Has patient had a PCN reaction causing severe rash involving mucus membranes or skin necrosis: Unknown Has patient had a PCN reaction that required hospitalization: No Has patient had a PCN reaction occurring within the last 10 years: No Childhood reaction. If all of the above answers are "NO", then may proceed with Cephalosporin use.    Past Medical History:   Diagnosis Date   Breast cancer ([place])    Adenocarcinoma   Chronic edema    CKD (chronic kidney disease) stage 3, GFR 30-59 ml/min (HCC) 10/22/2018   Coronary atherosclerosis of native coronary artery    Nonobstructive at catheterization 2003, LVEF normal    Essential hypertension    GERD (gastroesophageal reflux disease) 07/06/2013   Idiopathic peripheral neuropathy 10/06/2013   Mixed hyperlipidemia    Occlusion and stenosis of left carotid artery 10/22/2017   Renal artery stenosis (HCC)    BMS bilateally 2003, PTCA bilaterally 2005 with restenosis   Sleep apnea    Stop Bang score of 4   Stroke [facility])    Symptomatic varicose veins, right 10/16/2018    Past Surgical History:  Procedure Laterality Date   APPENDECTOMY     CATARACT EXTRACTION W/PHACO  03/29/2011   Procedure: CATARACT EXTRACTION PHACO AND INTRAOCULAR LENS PLACEMENT ([place]);  Surgeon: Tonny Branch, MD;  Location: [facility];  Service: Ophthalmology;  Laterality: Right;  CDE:12.82   CATARACT EXTRACTION W/PHACO  04/16/2011   Procedure: CATARACT EXTRACTION PHACO AND INTRAOCULAR LENS PLACEMENT ([facility]);  Surgeon: Tonny Branch, MD;  Location: [facility];  Service: Ophthalmology;  Laterality: Left;  CDE: 11.54   CESAREAN SECTION     x2   CHOLECYSTECTOMY  2018   HEMORRHOID SURGERY     RENAL ANGIOGRAPHY N/A 05/20/2019   Procedure: RENAL ANGIOGRAPHY;  Surgeon: Wellington Hampshire, MD;  Location: [place] [facility];  Service: Cardiovascular;  Laterality: N/A;   RENAL INTERVENTION  05/20/2019   Procedure: RENAL INTERVENTION;  Surgeon: Wellington Hampshire, MD;  Location: [place] [facility];  Service: Cardiovascular;;   Right breast lumpectomy     ROTATOR CUFF REPAIR  2008   UPPER EXTREMITY ANGIOGRAPHY N/A 11/27/2017   Procedure: UPPER EXTREMITY ANGIOGRAPHY;  Surgeon: Marty Heck, MD;  Location: [place] [facility];  Service: Cardiovascular;  Laterality: N/A;    Social History   Socioeconomic History   Marital status: Widowed    Spouse name: Not on  file   Number of children: 2   Years of education: 12th   Highest education level: 12th grade  Occupational History    Employer: RETIRED    Comment: n/a  Tobacco Use   Smoking status: Former    Packs/day: 1.00    Years: 35.00    Pack years: 35.00    Types: Cigarettes    Quit date: 01/22/1998    Years since quitting: 22.5   Smokeless tobacco: Never   Tobacco comments:  tobacco use - no  Vaping Use   Vaping Use: Never used  Substance and Sexual Activity   Alcohol use: No   Drug use: No   Sexual activity: Not on file  Other Topics Concern   Not on file  Social History Narrative   Patient lives at home with family.  Two sons and two granddaughters live with her.   Caffeine use; 2-5 cups daily   Oldest son has cancer   Social Determinants of Health   Financial Resource Strain: Low Risk    Difficulty of Paying Living Expenses: Not hard at all  Food Insecurity: No Food Insecurity   Worried About Charity fundraiser in the Last Year: Never true   [place] in the Last Year: Never true  Transportation Needs: No Transportation Needs   Lack of Transportation (Medical): No   Lack of Transportation (Non-Medical): No  Physical Activity: Inactive   Days of Exercise per Week: 0 days   Minutes of Exercise per Session: 0 min  Stress: Stress Concern Present   Feeling of Stress : Rather much  Social Connections: Socially Isolated   Frequency of Communication with Friends and Family: More than three times a week   Frequency of Social Gatherings with Friends and Family: Once a week   Attends Religious Services: Never   Marine scientist or Organizations: No   Attends Archivist Meetings: Never   Marital Status: Widowed  Human resources officer Violence: Not At Risk   Fear of Current or Ex-Partner: No   Emotionally Abused: No   Physically Abused: No   Sexually Abused: No        Objective:    BP (!) 149/76   Pulse 66   Temp (!) 97.3 F (36.3 C) (Temporal)   Ht  $R'5\' 4"'fk$  (1.626 m)   Wt 130 lb 3.2 oz (59.1 kg)   SpO2 97%   BMI 22.35 kg/m   Wt Readings from Last 3 Encounters:  08/11/20 130 lb 3.2 oz (59.1 kg)  06/24/20 128 lb (58.1 kg)  05/11/20 131 lb 3.2 oz (59.5 kg)   Physical Exam Vitals reviewed.  Constitutional:      General: She is not in acute distress.    Appearance: Normal appearance. She is normal weight. She is not ill-appearing, toxic-appearing or diaphoretic.  HENT:     Head: Normocephalic and atraumatic.  Eyes:     General: No scleral icterus.       Right eye: No discharge.        Left eye: No discharge.     Conjunctiva/sclera: Conjunctivae normal.  Cardiovascular:     Rate and Rhythm: Normal rate and regular rhythm.     Heart sounds: Normal heart sounds. No murmur heard.   No friction rub. No gallop.  Pulmonary:     Effort: Pulmonary effort is normal. No respiratory distress.     Breath sounds: Normal breath sounds. No stridor. No wheezing, rhonchi or rales.  Musculoskeletal:        General: Normal range of motion.     Cervical back: Normal range of motion.     Right lower leg: Edema present.     Left lower leg: Edema present.  Skin:    General: Skin is warm and dry.     Capillary Refill: Capillary refill takes less than 2 seconds.  Neurological:     General: No focal deficit present.     Mental Status: She is alert and oriented  to person, place, and time. Mental status is at baseline.  Psychiatric:        Mood and Affect: Mood normal.        Behavior: Behavior normal.        Thought Content: Thought content normal.        Judgment: Judgment normal.    Lab Results  Component Value Date   TSH 2.000 05/11/2020   Lab Results  Component Value Date   WBC 7.3 05/12/2019   HGB 14.0 05/12/2019   HCT 40.9 05/12/2019   MCV 87 05/12/2019   PLT 244 05/12/2019   Lab Results  Component Value Date   NA 140 05/11/2020   K 3.5 05/11/2020   CO2 24 05/11/2020   GLUCOSE 112 (H) 05/11/2020   BUN 15 05/11/2020    CREATININE 1.15 (H) 05/11/2020   BILITOT 0.6 05/11/2020   ALKPHOS 60 05/11/2020   AST 21 05/11/2020   ALT 14 05/11/2020   PROT 7.4 05/11/2020   ALBUMIN 4.9 (H) 05/11/2020   CALCIUM 9.5 05/11/2020   ANIONGAP 6 06/18/2016   EGFR 49 (L) 05/11/2020   Lab Results  Component Value Date   CHOL 198 05/11/2020   Lab Results  Component Value Date   HDL 89 05/11/2020   Lab Results  Component Value Date   LDLCALC 87 05/11/2020   Lab Results  Component Value Date   TRIG 133 05/11/2020   Lab Results  Component Value Date   CHOLHDL 2.2 05/11/2020   No results found for: HGBA1C

## 2020-08-13 ENCOUNTER — Other Ambulatory Visit: Payer: Self-pay | Admitting: Family Medicine

## 2020-08-13 DIAGNOSIS — R600 Localized edema: Secondary | ICD-10-CM

## 2020-08-23 ENCOUNTER — Ambulatory Visit: Payer: Medicare Other | Admitting: Cardiovascular Disease

## 2020-08-23 ENCOUNTER — Other Ambulatory Visit: Payer: Self-pay | Admitting: Family Medicine

## 2020-08-23 DIAGNOSIS — E785 Hyperlipidemia, unspecified: Secondary | ICD-10-CM

## 2020-08-23 DIAGNOSIS — Z8673 Personal history of transient ischemic attack (TIA), and cerebral infarction without residual deficits: Secondary | ICD-10-CM

## 2020-09-13 ENCOUNTER — Other Ambulatory Visit: Payer: Self-pay

## 2020-09-13 ENCOUNTER — Ambulatory Visit (INDEPENDENT_AMBULATORY_CARE_PROVIDER_SITE_OTHER): Payer: Medicare Other | Admitting: Cardiovascular Disease

## 2020-09-13 VITALS — BP 140/70 | HR 61 | Ht 63.5 in | Wt 130.6 lb

## 2020-09-13 DIAGNOSIS — E782 Mixed hyperlipidemia: Secondary | ICD-10-CM | POA: Diagnosis not present

## 2020-09-13 DIAGNOSIS — I701 Atherosclerosis of renal artery: Secondary | ICD-10-CM | POA: Diagnosis not present

## 2020-09-13 DIAGNOSIS — I10 Essential (primary) hypertension: Secondary | ICD-10-CM | POA: Diagnosis not present

## 2020-09-13 DIAGNOSIS — I779 Disorder of arteries and arterioles, unspecified: Secondary | ICD-10-CM | POA: Diagnosis not present

## 2020-09-13 NOTE — Progress Notes (Signed)
Cardiology Office Note   Date:  09/13/2020   ID:  Dorethy, Laliberte 14-May-1940, MRN [id number]  PCP:  Loman Brooklyn, FNP  Cardiologist: Dr. Domenic Polite  No chief complaint on file.     History of Present Illness: Tamara King is a 80 y.o. female who is here today for follow-up visit regarding renal artery stenosis.   She has history of mild nonobstructive coronary artery disease on previous cardiac catheterization in 2003.  She has known history of renal artery stenosis.  She underwent bilateral renal artery stenting in December 2003.  She had restenosis in 2005 and had repeat angioplasty for in-stent restenosis.   She has chronic medical conditions that include difficult to control hypertension, hyperlipidemia, carotid disease with known occluded left carotid artery, mild chronic kidney disease, previous tobacco use and left bundle branch block. She has intolerance to multiple antihypertensive medications according to the notes.  She is not able to tolerate ACE inhibitors or ARB due to an allergic reaction.  Hydrochlorothiazide caused constipation.  Spironolactone caused worsening renal function.    She had worsening renal artery stenosis in 2021 and thus angiography was performed in April 2021 which showed patent bilateral renal artery stents with significant restenosis on the left side.  I performed successful balloon angioplasty of the left renal artery stent.  Postprocedure duplex showed improved velocity but still elevated in the left side at 291. Subsequent Doppler showed improvement in velocity to a maximum of 223.  She complained of atypical chest pain and shortness of breath during last visit and underwent a Lexiscan Myoview which showed no evidence of ischemia with normal ejection fraction.  Echocardiogram showed normal LV systolic function with mild pulmonary hypertension.  She has been doing reasonably well with no recent chest pain or worsening dyspnea.  Blood pressure  has been reasonably controlled.   Past Medical History:  Diagnosis Date   Breast cancer ([place])    Adenocarcinoma   Chronic edema    CKD (chronic kidney disease) stage 3, GFR 30-59 ml/min (HCC) 10/22/2018   Coronary atherosclerosis of native coronary artery    Nonobstructive at catheterization 2003, LVEF normal    Essential hypertension    GERD (gastroesophageal reflux disease) 07/06/2013   Idiopathic peripheral neuropathy 10/06/2013   Mixed hyperlipidemia    Occlusion and stenosis of left carotid artery 10/22/2017   Renal artery stenosis (HCC)    BMS bilateally 2003, PTCA bilaterally 2005 with restenosis   Sleep apnea    Stop Bang score of 4   Stroke [facility])    Symptomatic varicose veins, right 10/16/2018    Past Surgical History:  Procedure Laterality Date   APPENDECTOMY     CATARACT EXTRACTION W/PHACO  03/29/2011   Procedure: CATARACT EXTRACTION PHACO AND INTRAOCULAR LENS PLACEMENT ([place]);  Surgeon: Tonny Branch, MD;  Location: [facility];  Service: Ophthalmology;  Laterality: Right;  CDE:12.82   CATARACT EXTRACTION W/PHACO  04/16/2011   Procedure: CATARACT EXTRACTION PHACO AND INTRAOCULAR LENS PLACEMENT ([facility]);  Surgeon: Tonny Branch, MD;  Location: [facility];  Service: Ophthalmology;  Laterality: Left;  CDE: 11.54   CESAREAN SECTION     x2   CHOLECYSTECTOMY  2018   HEMORRHOID SURGERY     RENAL ANGIOGRAPHY N/A 05/20/2019   Procedure: RENAL ANGIOGRAPHY;  Surgeon: Wellington Hampshire, MD;  Location: [place] [facility];  Service: Cardiovascular;  Laterality: N/A;   RENAL INTERVENTION  05/20/2019   Procedure: RENAL INTERVENTION;  Surgeon: Wellington Hampshire, MD;  Location:  [place] [facility];  Service: Cardiovascular;;   Right breast lumpectomy     ROTATOR CUFF REPAIR  2008   UPPER EXTREMITY ANGIOGRAPHY N/A 11/27/2017   Procedure: UPPER EXTREMITY ANGIOGRAPHY;  Surgeon: Marty Heck, MD;  Location: [place] [facility];  Service: Cardiovascular;  Laterality: N/A;     Current Outpatient  Medications  Medication Sig Dispense Refill   acetaminophen (TYLENOL) 500 MG tablet Take 500 mg by mouth every 6 (six) hours as needed for moderate pain.     albuterol (VENTOLIN HFA) 108 (90 Base) MCG/ACT inhaler INHALE 2 PUFFS BY MOUTH EVERY 6 HOURS AS NEEDED FOR WHEEZING AND FOR SHORTNESS OF BREATH 18 g 2   alendronate (FOSAMAX) 70 MG tablet Take 1 tablet (70 mg total) by mouth every 7 (seven) days. Take with a full glass of water on an empty stomach. 4 tablet 5   amLODipine (NORVASC) 5 MG tablet Take 1 tablet (5 mg total) by mouth daily. 90 tablet 1   aspirin EC 81 MG tablet Take 81 mg by mouth daily.     carvedilol (COREG) 25 MG tablet Take 1 tablet by mouth twice daily 180 tablet 0   cholecalciferol (VITAMIN D3) 25 MCG (1000 UNIT) tablet Take 1,000 Units by mouth daily.     clopidogrel (PLAVIX) 75 MG tablet Take 1 tablet by mouth once daily 90 tablet 0   EQ ALLERGY RELIEF, CETIRIZINE, 10 MG tablet Take 1 tablet by mouth once daily 90 tablet 1   famotidine (PEPCID) 20 MG tablet Take 1 tablet by mouth once daily 90 tablet 0   fluticasone (FLONASE) 50 MCG/ACT nasal spray Place 2 sprays into the nose daily as needed for allergies (congestion.).      furosemide (LASIX) 40 MG tablet Take 1 tablet (40 mg total) by mouth 2 (two) times daily. 180 tablet 1   hydrALAZINE (APRESOLINE) 10 MG tablet Take 2 tablets (20 mg total) by mouth 3 (three) times daily. 180 tablet 5   ondansetron (ZOFRAN) 4 MG tablet TAKE 1 TABLET BY MOUTH EVERY 8 HOURS AS NEEDED FOR NAUSEA OR VOMITING 20 tablet 0   pantoprazole (PROTONIX) 40 MG tablet 1 tablet daily 90 tablet 1   pravastatin (PRAVACHOL) 80 MG tablet Take 1 tablet by mouth once daily 90 tablet 0   Tetrahydrozoline HCl (VISINE OP) Place 2 drops into both eyes daily as needed (dry eyes).     No current facility-administered medications for this visit.    Allergies:   Losartan, Sulfa antibiotics, Codeine, Fish allergy, Other, Spironolactone, Vancomycin, and  Penicillins    Social History:  The patient  reports that she quit smoking about 22 years ago. Her smoking use included cigarettes. She has a 35.00 pack-year smoking history. She has never used smokeless tobacco. She reports that she does not drink alcohol and does not use drugs.   Family History:  The patient's family history includes Brain cancer in her brother and sister; COPD in her sister; Diabetes in her brother, brother, father, sister, son, and son; Heart failure in her mother; Lung cancer in her father; Stroke (age of onset: 80) in her mother; Thyroid cancer in her son.    ROS:  Please see the history of present illness.   Otherwise, review of systems are positive for none.   All other systems are reviewed and negative.    PHYSICAL EXAM: VS:  BP 140/70 (BP Location: Left Arm, Patient Position: Sitting, Cuff Size: Normal)   Pulse 61   Ht  5' 3.5" (1.613 m)   Wt 130 lb 9.6 oz (59.2 kg)   SpO2 94%   BMI 22.77 kg/m  , BMI Body mass index is 22.77 kg/m. GEN: Well nourished, well developed, in no acute distress  HEENT: normal  Neck: no JVD,  or masses.  Bilateral carotid bruits Cardiac: RRR; no  rubs, or gallops.  2/ 6 systolic murmur in the aortic area which is early peaking.  There is mild bilateral leg edema with significant tenderness. Respiratory:  clear to auscultation bilaterally, normal work of breathing GI: soft, nontender, nondistended, + BS MS: no deformity or atrophy  Skin: warm and dry, no rash Neuro:  Strength and sensation are intact Psych: euthymic mood, full affect    EKG:  EKG is ordered today. EKG showed normal sinus rhythm with first-degree AV block and left bundle branch block.   Recent Labs: 05/11/2020: ALT 14; BUN 15; Creatinine, Ser 1.15; Potassium 3.5; Sodium 140; TSH 2.000    Lipid Panel    Component Value Date/Time   CHOL 198 05/11/2020 1043   TRIG 133 05/11/2020 1043   HDL 89 05/11/2020 1043   CHOLHDL 2.2 05/11/2020 1043   LDLCALC 87  05/11/2020 1043      Wt Readings from Last 3 Encounters:  09/13/20 130 lb 9.6 oz (59.2 kg)  08/11/20 130 lb 3.2 oz (59.1 kg)  06/24/20 128 lb (58.1 kg)        No flowsheet data found.    ASSESSMENT AND PLAN:  1.  Renal artery stenosis: Status post balloon angioplasty of the left renal artery in-stent restenosis.  Most recent Doppler in November showed patent bilateral renal artery stents with no significant restenosis.  I requested a follow-up renal artery duplex to be done in November. She does not need to be on dual antiplatelet therapy anymore and thus I discontinued aspirin and we will plan on keeping her on clopidogrel monotherapy for now given her vascular disease.  2.  Carotid artery disease: Most recent carotid Doppler in March showed chronically occluded left ICA with mild disease on the right side with possible right subclavian artery stenosis.  Repeat study in March of next year.  3.  Hyperlipidemia: She is currently on high-dose pravastatin.  Consider a more potent statin.  4.  Hypertension: Blood pressure is reasonably controlled on current medications.  5.  Chronic kidney disease: She is followed by nephrology with stable renal function.  Most recent GFR was 49.  Disposition:   FU with me in 12 months  Signed,  Kathlyn Sacramento, MD  09/13/2020 9:51 AM    [place]

## 2020-09-13 NOTE — Patient Instructions (Signed)
Medication Instructions:  STOP aspirin CONTINUE other current medications  *If you need a refill on your cardiac medications before your next appointment, please call your pharmacy*   Testing/Procedures: Renal Artery Doppler in November 2022   Follow-Up: At [facility], you and your health needs are our priority.  As part of our continuing mission to provide you with exceptional heart care, we have created designated Provider Care Teams.  These Care Teams include your primary Cardiologist (physician) and Advanced Practice Providers (APPs -  Physician Assistants and Nurse Practitioners) who all work together to provide you with the care you need, when you need it.  We recommend signing up for the patient portal called "MyChart".  Sign up information is provided on this After Visit Summary.  MyChart is used to connect with patients for Virtual Visits (Telemedicine).  Patients are able to view lab/test results, encounter notes, upcoming appointments, etc.  Non-urgent messages can be sent to your provider as well.   To learn more about what you can do with MyChart, go to [email or web address].    Your next appointment:   12 month(s)  The format for your next appointment:   In Person  Provider:   Dr. Fletcher Anon   Other Instructions

## 2020-09-16 ENCOUNTER — Ambulatory Visit (INDEPENDENT_AMBULATORY_CARE_PROVIDER_SITE_OTHER): Payer: Medicare Other | Admitting: Cardiology

## 2020-09-16 ENCOUNTER — Encounter: Payer: Self-pay | Admitting: Cardiology

## 2020-09-16 ENCOUNTER — Other Ambulatory Visit: Payer: Self-pay

## 2020-09-16 VITALS — BP 130/76 | HR 62 | Ht 63.5 in | Wt 129.0 lb

## 2020-09-16 DIAGNOSIS — I10 Essential (primary) hypertension: Secondary | ICD-10-CM | POA: Diagnosis not present

## 2020-09-16 DIAGNOSIS — I6523 Occlusion and stenosis of bilateral carotid arteries: Secondary | ICD-10-CM | POA: Diagnosis not present

## 2020-09-16 DIAGNOSIS — I701 Atherosclerosis of renal artery: Secondary | ICD-10-CM | POA: Diagnosis not present

## 2020-09-16 NOTE — Progress Notes (Signed)
Cardiology Office Note  Date: 09/16/2020   ID: Tamara, King Nov 07, 1940, MRN [id number]  PCP:  Loman Brooklyn, FNP  Cardiologist:  Rozann Lesches, MD Electrophysiologist:  None   Chief Complaint  Patient presents with   Cardiac follow-up    History of Present Illness: Tamara King is a 80 y.o. female last seen in February.  She is here for a routine visit.  Reports no active angina symptoms on medical therapy.  Remains functional with ADLs.  She had a recent follow-up visit with Dr. Fletcher Anon, I reviewed the note.  Aspirin was stopped at that time and she was scheduled for follow-up with renal artery duplex in November.  Carotid Dopplers from March outlined below.  She remains asymptomatic.  I reviewed her medications which are noted below.  She has follow-up with PCP next month.  Past Medical History:  Diagnosis Date   Breast cancer ([place])    Adenocarcinoma   Chronic edema    CKD (chronic kidney disease) stage 3, GFR 30-59 ml/min (HCC) 10/22/2018   Coronary atherosclerosis of native coronary artery    Nonobstructive at catheterization 2003, LVEF normal    Essential hypertension    GERD (gastroesophageal reflux disease) 07/06/2013   Idiopathic peripheral neuropathy 10/06/2013   Mixed hyperlipidemia    Occlusion and stenosis of left carotid artery 10/22/2017   Renal artery stenosis (HCC)    BMS bilateally 2003, PTCA bilaterally 2005 with restenosis   Sleep apnea    Stop Bang score of 4   Stroke [facility])    Symptomatic varicose veins, right 10/16/2018    Past Surgical History:  Procedure Laterality Date   APPENDECTOMY     CATARACT EXTRACTION W/PHACO  03/29/2011   Procedure: CATARACT EXTRACTION PHACO AND INTRAOCULAR LENS PLACEMENT ([place]);  Surgeon: Tonny Branch, MD;  Location: [facility];  Service: Ophthalmology;  Laterality: Right;  CDE:12.82   CATARACT EXTRACTION W/PHACO  04/16/2011   Procedure: CATARACT EXTRACTION PHACO AND INTRAOCULAR LENS PLACEMENT ([facility]);  Surgeon: Tonny Branch, MD;  Location: [facility];  Service: Ophthalmology;  Laterality: Left;  CDE: 11.54   CESAREAN SECTION     x2   CHOLECYSTECTOMY  2018   HEMORRHOID SURGERY     RENAL ANGIOGRAPHY N/A 05/20/2019   Procedure: RENAL ANGIOGRAPHY;  Surgeon: Wellington Hampshire, MD;  Location: [place] [facility];  Service: Cardiovascular;  Laterality: N/A;   RENAL INTERVENTION  05/20/2019   Procedure: RENAL INTERVENTION;  Surgeon: Wellington Hampshire, MD;  Location: [place] [facility];  Service: Cardiovascular;;   Right breast lumpectomy     ROTATOR CUFF REPAIR  2008   UPPER EXTREMITY ANGIOGRAPHY N/A 11/27/2017   Procedure: UPPER EXTREMITY ANGIOGRAPHY;  Surgeon: Marty Heck, MD;  Location: [place] [facility] LAB;  Service: Cardiovascular;  Laterality: N/A;    Current Outpatient Medications  Medication Sig Dispense Refill   acetaminophen (TYLENOL) 500 MG tablet Take 500 mg by mouth every 6 (six) hours as needed for moderate pain.     albuterol (VENTOLIN HFA) 108 (90 Base) MCG/ACT inhaler INHALE 2 PUFFS BY MOUTH EVERY 6 HOURS AS NEEDED FOR WHEEZING AND FOR SHORTNESS OF BREATH 18 g 2   alendronate (FOSAMAX) 70 MG tablet Take 1 tablet (70 mg total) by mouth every 7 (seven) days. Take with a full glass of water on an empty stomach. 4 tablet 5   amLODipine (NORVASC) 5 MG tablet Take 1 tablet (5 mg total) by mouth daily. 90 tablet 1   carvedilol (  COREG) 25 MG tablet Take 1 tablet by mouth twice daily 180 tablet 0   cholecalciferol (VITAMIN D3) 25 MCG (1000 UNIT) tablet Take 1,000 Units by mouth daily.     clopidogrel (PLAVIX) 75 MG tablet Take 1 tablet by mouth once daily 90 tablet 0   EQ ALLERGY RELIEF, CETIRIZINE, 10 MG tablet Take 1 tablet by mouth once daily 90 tablet 1   famotidine (PEPCID) 20 MG tablet Take 1 tablet by mouth once daily 90 tablet 0   fluticasone (FLONASE) 50 MCG/ACT nasal spray Place 2 sprays into the nose daily as needed for allergies (congestion.).      furosemide (LASIX) 40 MG tablet Take 1 tablet  (40 mg total) by mouth 2 (two) times daily. 180 tablet 1   hydrALAZINE (APRESOLINE) 10 MG tablet Take 2 tablets (20 mg total) by mouth 3 (three) times daily. 180 tablet 5   ondansetron (ZOFRAN) 4 MG tablet TAKE 1 TABLET BY MOUTH EVERY 8 HOURS AS NEEDED FOR NAUSEA OR VOMITING 20 tablet 0   pantoprazole (PROTONIX) 40 MG tablet 1 tablet daily 90 tablet 1   pravastatin (PRAVACHOL) 80 MG tablet Take 1 tablet by mouth once daily 90 tablet 0   Tetrahydrozoline HCl (VISINE OP) Place 2 drops into both eyes daily as needed (dry eyes).     No current facility-administered medications for this visit.   Allergies:  Losartan, Sulfa antibiotics, Codeine, Fish allergy, Other, Spironolactone, Vancomycin, and Penicillins   ROS: No palpitations or syncope.  Physical Exam: VS:  BP 130/76   Pulse 62   Ht 5' 3.5" (1.613 m)   Wt 129 lb (58.5 kg)   SpO2 97%   BMI 22.49 kg/m , BMI Body mass index is 22.49 kg/m.  Wt Readings from Last 3 Encounters:  09/16/20 129 lb (58.5 kg)  09/13/20 130 lb 9.6 oz (59.2 kg)  08/11/20 130 lb 3.2 oz (59.1 kg)    General: Patient appears comfortable at rest. HEENT: Conjunctiva and lids normal, wearing a mask. Neck: Supple, no elevated JVP or carotid bruits, no thyromegaly. Lungs: Clear to auscultation, nonlabored breathing at rest. Cardiac: Regular rate and rhythm, no S3, 1/6 systolic murmur. Extremities: No pitting edema.  ECG:  An ECG dated 09/13/2020 was personally reviewed today and demonstrated:  Sinus rhythm with prolonged PR interval and left bundle branch block.  Recent Labwork: 05/11/2020: ALT 14; AST 21; BUN 15; Creatinine, Ser 1.15; Potassium 3.5; Sodium 140; TSH 2.000     Component Value Date/Time   CHOL 198 05/11/2020 1043   TRIG 133 05/11/2020 1043   HDL 89 05/11/2020 1043   CHOLHDL 2.2 05/11/2020 1043   LDLCALC 87 05/11/2020 1043    Other Studies Reviewed Today:  Lexiscan Myoview 02/25/2020: The left ventricular ejection fraction is normal  (55-65%). Nuclear stress EF: 61%. There was no ST segment deviation noted during stress. The study is normal. This is a low risk study. Paradoxical septal motion due to conduction delay, otherwise normal wall motion.   No ischemia or infarction on perfusion imaging. Low risk stress test.    Echocardiogram 03/03/2020:  1. Left ventricular ejection fraction, by estimation, is 65 to 70%. The  left ventricle has normal function. The left ventricle has no regional  wall motion abnormalities. Left ventricular diastolic parameters were  normal. Normal global longitudinal  strain of -18.5%.   2. Right ventricular systolic function is normal. The right ventricular  size is normal. There is mildly elevated pulmonary artery systolic  pressure. The estimated  right ventricular systolic pressure is 99991111 mmHg.   3. The mitral valve is grossly normal. Mild mitral valve regurgitation.   4. The aortic valve is tricuspid. Aortic valve regurgitation is trivial.  Aortic regurgitation PHT measures 642 msec.   5. The inferior vena cava is normal in size with greater than 50%  respiratory variability, suggesting right atrial pressure of 3 mmHg.   Carotid Dopplers 04/06/2020: Summary:  Right Carotid: Velocities in the right ICA are consistent with a 1-39%  stenosis.   Left Carotid: Evidence consistent with a total occlusion of the left ICA.   Vertebrals:  Bilateral vertebral arteries demonstrate antegrade flow.  Subclavians: Right subclavian artery was stenotic. Normal flow  hemodynamics were               seen in the left subclavian artery.   Assessment and Plan:  1.  Bilateral carotid artery disease.  She has known occlusion of the LICA with 1 to [id number] RICA stenosis by recent follow-up carotid Dopplers.  She remains asymptomatic and is on Plavix along with Pravachol.  2.  Renal artery stenosis status post bilateral stent intervention and subsequent angioplasty due to in-stent restenosis.  She continues  to follow with Dr. Fletcher Anon.  She remains on Plavix with follow-up renal artery duplex later this year.  Blood pressure is adequately controlled today.  3.  Essential hypertension, continue Coreg, Norvasc, and hydralazine.  Medication Adjustments/Labs and Tests Ordered: Current medicines are reviewed at length with the patient today.  Concerns regarding medicines are outlined above.   Tests Ordered: No orders of the defined types were placed in this encounter.   Medication Changes: No orders of the defined types were placed in this encounter.   Disposition:  Follow up  6 months.  Signed, Satira Sark, MD, [facility] 09/16/2020 11:19 AM    [facility] [place], [place], [place] [address] Phone: [phone]; Fax: [phone]

## 2020-09-16 NOTE — Patient Instructions (Addendum)

## 2020-09-19 ENCOUNTER — Other Ambulatory Visit: Payer: Self-pay

## 2020-09-19 ENCOUNTER — Other Ambulatory Visit: Payer: Medicare Other

## 2020-10-11 ENCOUNTER — Other Ambulatory Visit: Payer: Self-pay | Admitting: Family Medicine

## 2020-10-11 DIAGNOSIS — K219 Gastro-esophageal reflux disease without esophagitis: Secondary | ICD-10-CM

## 2020-10-30 ENCOUNTER — Other Ambulatory Visit: Payer: Self-pay | Admitting: Family Medicine

## 2020-10-30 DIAGNOSIS — I10 Essential (primary) hypertension: Secondary | ICD-10-CM

## 2020-11-15 ENCOUNTER — Ambulatory Visit (INDEPENDENT_AMBULATORY_CARE_PROVIDER_SITE_OTHER): Payer: Medicare Other | Admitting: Family Medicine

## 2020-11-15 ENCOUNTER — Encounter: Payer: Self-pay | Admitting: Family Medicine

## 2020-11-15 ENCOUNTER — Other Ambulatory Visit: Payer: Self-pay

## 2020-11-15 VITALS — BP 147/71 | HR 63 | Temp 96.8°F | Resp 20 | Ht 63.5 in | Wt 127.0 lb

## 2020-11-15 DIAGNOSIS — M810 Age-related osteoporosis without current pathological fracture: Secondary | ICD-10-CM

## 2020-11-15 DIAGNOSIS — E782 Mixed hyperlipidemia: Secondary | ICD-10-CM | POA: Diagnosis not present

## 2020-11-15 DIAGNOSIS — I6522 Occlusion and stenosis of left carotid artery: Secondary | ICD-10-CM | POA: Diagnosis not present

## 2020-11-15 DIAGNOSIS — I10 Essential (primary) hypertension: Secondary | ICD-10-CM

## 2020-11-15 DIAGNOSIS — E559 Vitamin D deficiency, unspecified: Secondary | ICD-10-CM

## 2020-11-15 DIAGNOSIS — N1832 Chronic kidney disease, stage 3b: Secondary | ICD-10-CM

## 2020-11-15 DIAGNOSIS — K219 Gastro-esophageal reflux disease without esophagitis: Secondary | ICD-10-CM

## 2020-11-15 DIAGNOSIS — I701 Atherosclerosis of renal artery: Secondary | ICD-10-CM | POA: Diagnosis not present

## 2020-11-15 DIAGNOSIS — F4321 Adjustment disorder with depressed mood: Secondary | ICD-10-CM

## 2020-11-15 DIAGNOSIS — Z23 Encounter for immunization: Secondary | ICD-10-CM

## 2020-11-15 DIAGNOSIS — Z8673 Personal history of transient ischemic attack (TIA), and cerebral infarction without residual deficits: Secondary | ICD-10-CM

## 2020-11-15 DIAGNOSIS — M159 Polyosteoarthritis, unspecified: Secondary | ICD-10-CM

## 2020-11-15 DIAGNOSIS — R600 Localized edema: Secondary | ICD-10-CM

## 2020-11-15 NOTE — Patient Instructions (Addendum)
Please return for lab work when you have not eaten or drank anything in the morning.

## 2020-11-15 NOTE — Progress Notes (Signed)
Assessment & Plan:  1. Renal artery stenosis ([place]) Managed by Dr. Fletcher Anon. Plan to repeat doppler in November. - CMP14+EGFR; Future - Lipid panel; Future  2. Occlusion and stenosis of left carotid artery Managed by Dr. Domenic Polite. Plan to repeat carotid dopplers in March 2023. - CMP14+EGFR; Future - Lipid panel; Future  3. Essential (primary) hypertension Well controlled on current regimen.  - CBC with Differential/Platelet; Future - CMP14+EGFR; Future - Lipid panel; Future  4. Mixed hyperlipidemia Patient would like to return for fasting lab work and decide afterwards whether to change statin to a stronger intensity.  - CMP14+EGFR; Future - Lipid panel; Future  5. Lower extremity edema Well controlled on current regimen.  - CMP14+EGFR; Future  6. History of CVA (cerebrovascular accident) Continue Plavix and statin.  7. Gastroesophageal reflux disease, unspecified whether esophagitis present Well controlled on current regimen.  - CMP14+EGFR; Future  8. Vitamin D deficiency Labs to assess. - VITAMIN D 25 Hydroxy (Vit-D Deficiency, Fractures); Future  9. Age-related osteoporosis without current pathological fracture Continue Fosamax. Repeat DEXA next year. - CMP14+EGFR; Future  10. Primary osteoarthritis involving multiple joints Well controlled on current regimen.  - CMP14+EGFR; Future  11. Stage 3b chronic kidney disease ([place]) Managed by nephrology. - CMP14+EGFR; Future  12. Grief Education provided on managing grief. Patient declines medication. Encouraged to seek counseling/grief support.  13. Need for immunization against influenza - Flu Vaccine QUAD High Dose(Fluad)   Return in about 3 months (around 02/15/2021) for follow-up of chronic medication conditions.  Hendricks Limes, MSN, APRN, FNP-C [facility]  Subjective:    Patient ID: Tamara King, female    DOB: 07/16/1940, 80 y.o.   MRN: [id number]  Patient Care Team: Loman Brooklyn, FNP as PCP - General (Family Medicine) Satira Sark, MD as PCP - Cardiology (Cardiology) Liana Gerold, MD as Consulting Physician (Nephrology) Wellington Hampshire, MD as Consulting Physician (Cardiology)   Chief Complaint:  Chief Complaint  Patient presents with   Medical Management of Chronic Issues    HPI: ALYSANDRA King is a 80 y.o. female presenting on 11/15/2020 for Medical Management of Chronic Issues  Renal artery stenosis: Managed by Dr. Fletcher Anon.  Patient had a balloon angioplasty of the left renal artery in-stent restenosis.  Velocities on Doppler showed patent bilateral renal artery stent with no significant restenosis. She was last seen on 09/13/2020 at which time a repeat doppler was ordered to be completed in November. Her ASA was discontinued and she was continued on Plavix. She is to follow-up in one year.   Carotid artery disease: managed by Dr. Domenic Polite, cardiologist. Carotid dopplers repeated in March which are stable. LICA occluded as before and only mild RICA stenosis. The plan is to repeat her carotid dopplers in March of next year. She last saw Dr. Domenic Polite on 09/16/2020 and should return in six months.   Hypertension: managed by cardiology and nephrology. Amlodipine dose was decreased by nephrology in June 2022 due to her edema per patient. She reports she wasn't sure initially if it improved the edema or not.   Hyperlipidemia: Patient is taking pravastatin nightly. It was recommended by Dr. Fletcher Anon that a more potent statin be considered. Patient would like her lab work completed before agreeing to a change in statin therapy.   Lower extremity edema: taking Lasix 40 mg twice daily. She does not elevate her feet because she is constantly going and taking care of others. She does not wear compression  hose because she reports they hurt.   History of CVA: Patient has continued with clopidogrel.  GERD: Patient is taking both Protonix and  famotidine.  Vitamin D deficiency: Patient was diagnosed with vitamin D deficiency by her nephrologist.  She is on a daily supplement.   Osteoporosis: Patient is taking Fosamax.  Osteoarthritis: Patient only takes Tylenol for pain and does not wish to have anything stronger.  Unable to take NSAIDs.  CKD: Managed by Dr. Theador Hawthorne, nephrologist, who she is scheduled to see again on 01/04/2021.  New complaints: None, but patient did mention that her son passed away after his battle with thyroid cancer.   Social history:  Relevant past medical, surgical, family and social history reviewed and updated as indicated. Interim medical history since our last visit reviewed.  Allergies and medications reviewed and updated.  DATA REVIEWED: CHART IN EPIC  ROS: Negative unless specifically indicated above in HPI.    Current Outpatient Medications:    acetaminophen (TYLENOL) 500 MG tablet, Take 500 mg by mouth every 6 (six) hours as needed for moderate pain., Disp: , Rfl:    albuterol (VENTOLIN HFA) 108 (90 Base) MCG/ACT inhaler, INHALE 2 PUFFS BY MOUTH EVERY 6 HOURS AS NEEDED FOR WHEEZING AND FOR SHORTNESS OF BREATH, Disp: 18 g, Rfl: 2   alendronate (FOSAMAX) 70 MG tablet, Take 1 tablet (70 mg total) by mouth every 7 (seven) days. Take with a full glass of water on an empty stomach., Disp: 4 tablet, Rfl: 5   amLODipine (NORVASC) 5 MG tablet, Take 1 tablet (5 mg total) by mouth daily. (Patient taking differently: Take 2.5 mg by mouth daily.), Disp: 90 tablet, Rfl: 1   carvedilol (COREG) 25 MG tablet, Take 1 tablet by mouth twice daily, Disp: 180 tablet, Rfl: 0   cholecalciferol (VITAMIN D3) 25 MCG (1000 UNIT) tablet, Take 1,000 Units by mouth daily., Disp: , Rfl:    clopidogrel (PLAVIX) 75 MG tablet, Take 1 tablet by mouth once daily, Disp: 90 tablet, Rfl: 0   EQ ALLERGY RELIEF, CETIRIZINE, 10 MG tablet, Take 1 tablet by mouth once daily, Disp: 90 tablet, Rfl: 1   famotidine (PEPCID) 20 MG tablet,  Take 1 tablet by mouth once daily, Disp: 90 tablet, Rfl: 0   fluticasone (FLONASE) 50 MCG/ACT nasal spray, Place 2 sprays into the nose daily as needed for allergies (congestion.). , Disp: , Rfl:    furosemide (LASIX) 40 MG tablet, Take 1 tablet (40 mg total) by mouth 2 (two) times daily., Disp: 180 tablet, Rfl: 1   hydrALAZINE (APRESOLINE) 10 MG tablet, Take 2 tablets (20 mg total) by mouth 3 (three) times daily., Disp: 180 tablet, Rfl: 5   ondansetron (ZOFRAN) 4 MG tablet, TAKE 1 TABLET BY MOUTH EVERY 8 HOURS AS NEEDED FOR NAUSEA OR VOMITING, Disp: 20 tablet, Rfl: 0   pantoprazole (PROTONIX) 40 MG tablet, 1 tablet daily, Disp: 90 tablet, Rfl: 1   pravastatin (PRAVACHOL) 80 MG tablet, Take 1 tablet by mouth once daily, Disp: 90 tablet, Rfl: 0   Tetrahydrozoline HCl (VISINE OP), Place 2 drops into both eyes daily as needed (dry eyes)., Disp: , Rfl:    Allergies  Allergen Reactions   Losartan Anaphylaxis   Sulfa Antibiotics Anaphylaxis and Swelling   Codeine Nausea And Vomiting   Fish Allergy Other (See Comments)    "sick as a dog"   Other Other (See Comments)    Bologna - vomiting   Spironolactone Other (See Comments)    Acute kidney  injury   Vancomycin Other (See Comments)    "drives me crazy"   Penicillins Rash    Has patient had a PCN reaction causing immediate rash, facial/tongue/throat swelling, SOB or lightheadedness with hypotension: Unknown Has patient had a PCN reaction causing severe rash involving mucus membranes or skin necrosis: Unknown Has patient had a PCN reaction that required hospitalization: No Has patient had a PCN reaction occurring within the last 10 years: No Childhood reaction. If all of the above answers are "NO", then may proceed with Cephalosporin use.    Past Medical History:  Diagnosis Date   Breast cancer ([place])    Adenocarcinoma   Chronic edema    CKD (chronic kidney disease) stage 3, GFR 30-59 ml/min (HCC) 10/22/2018   Coronary atherosclerosis of  native coronary artery    Nonobstructive at catheterization 2003, LVEF normal    Essential hypertension    GERD (gastroesophageal reflux disease) 07/06/2013   Idiopathic peripheral neuropathy 10/06/2013   Mixed hyperlipidemia    Occlusion and stenosis of left carotid artery 10/22/2017   Renal artery stenosis (H[facility])    BMS bilateally 2003, PTCA bilaterally 2005 with restenosis   Sleep apnea    Stop Bang score of 4   Stroke [facility])    Symptomatic varicose veins, right 10/16/2018    Past Surgical History:  Procedure Laterality Date   APPENDECTOMY     CATARACT EXTRACTION W/PHACO  03/29/2011   Procedure: CATARACT EXTRACTION PHACO AND INTRAOCULAR LENS PLACEMENT ([place]);  Surgeon: Tonny Branch, MD;  Location: [facility];  Service: Ophthalmology;  Laterality: Right;  CDE:12.82   CATARACT EXTRACTION W/PHACO  04/16/2011   Procedure: CATARACT EXTRACTION PHACO AND INTRAOCULAR LENS PLACEMENT ([facility]);  Surgeon: Tonny Branch, MD;  Location: [facility];  Service: Ophthalmology;  Laterality: Left;  CDE: 11.54   CESAREAN SECTION     x2   CHOLECYSTECTOMY  2018   HEMORRHOID SURGERY     RENAL ANGIOGRAPHY N/A 05/20/2019   Procedure: RENAL ANGIOGRAPHY;  Surgeon: Wellington Hampshire, MD;  Location: [place] [facility];  Service: Cardiovascular;  Laterality: N/A;   RENAL INTERVENTION  05/20/2019   Procedure: RENAL INTERVENTION;  Surgeon: Wellington Hampshire, MD;  Location: [place] [facility];  Service: Cardiovascular;;   Right breast lumpectomy     ROTATOR CUFF REPAIR  2008   UPPER EXTREMITY ANGIOGRAPHY N/A 11/27/2017   Procedure: UPPER EXTREMITY ANGIOGRAPHY;  Surgeon: Marty Heck, MD;  Location: [place] [facility];  Service: Cardiovascular;  Laterality: N/A;    Social History   Socioeconomic History   Marital status: Widowed    Spouse name: Not on file   Number of children: 2   Years of education: 12th   Highest education level: 12th grade  Occupational History    Employer: RETIRED    Comment: n/a  Tobacco Use    Smoking status: Former    Packs/day: 1.00    Years: 35.00    Pack years: 35.00    Types: Cigarettes    Quit date: 01/22/1998    Years since quitting: 22.8   Smokeless tobacco: Never   Tobacco comments:    tobacco use - no  Vaping Use   Vaping Use: Never used  Substance and Sexual Activity   Alcohol use: No   Drug use: No   Sexual activity: Not on file  Other Topics Concern   Not on file  Social History Narrative   Patient lives at home with family.  Two sons and two granddaughters live with  her.   Caffeine use; 2-5 cups daily   Oldest son has cancer   Social Determinants of Health   Financial Resource Strain: Low Risk    Difficulty of Paying Living Expenses: Not hard at all  Food Insecurity: No Food Insecurity   Worried About Charity fundraiser in the Last Year: Never true   [place] in the Last Year: Never true  Transportation Needs: No Transportation Needs   Lack of Transportation (Medical): No   Lack of Transportation (Non-Medical): No  Physical Activity: Inactive   Days of Exercise per Week: 0 days   Minutes of Exercise per Session: 0 min  Stress: Stress Concern Present   Feeling of Stress : Rather much  Social Connections: Socially Isolated   Frequency of Communication with Friends and Family: More than three times a week   Frequency of Social Gatherings with Friends and Family: Once a week   Attends Religious Services: Never   Marine scientist or Organizations: No   Attends Archivist Meetings: Never   Marital Status: Widowed  Human resources officer Violence: Not At Risk   Fear of Current or Ex-Partner: No   Emotionally Abused: No   Physically Abused: No   Sexually Abused: No        Objective:    BP (!) 147/71   Pulse 63   Temp (!) 96.8 F (36 C) (Temporal)   Resp 20   Ht 5' 3.5" (1.613 m)   Wt 127 lb (57.6 kg)   SpO2 95%   BMI 22.14 kg/m   Wt Readings from Last 3 Encounters:  11/15/20 127 lb (57.6 kg)  09/16/20 129 lb (58.5  kg)  09/13/20 130 lb 9.6 oz (59.2 kg)   Physical Exam Vitals reviewed.  Constitutional:      General: She is not in acute distress.    Appearance: Normal appearance. She is normal weight. She is not ill-appearing, toxic-appearing or diaphoretic.  HENT:     Head: Normocephalic and atraumatic.  Eyes:     General: No scleral icterus.       Right eye: No discharge.        Left eye: No discharge.     Conjunctiva/sclera: Conjunctivae normal.  Cardiovascular:     Rate and Rhythm: Normal rate and regular rhythm.     Heart sounds: Normal heart sounds. No murmur heard.   No friction rub. No gallop.  Pulmonary:     Effort: Pulmonary effort is normal. No respiratory distress.     Breath sounds: Normal breath sounds. No stridor. No wheezing, rhonchi or rales.  Musculoskeletal:        General: Normal range of motion.     Cervical back: Normal range of motion.     Right lower leg: Edema present.     Left lower leg: Edema present.  Skin:    General: Skin is warm and dry.     Capillary Refill: Capillary refill takes less than 2 seconds.  Neurological:     General: No focal deficit present.     Mental Status: She is alert and oriented to person, place, and time. Mental status is at baseline.  Psychiatric:        Mood and Affect: Mood normal.        Behavior: Behavior normal.        Thought Content: Thought content normal.        Judgment: Judgment normal.    Lab Results  Component Value  Date   TSH 2.000 05/11/2020   Lab Results  Component Value Date   WBC 7.3 05/12/2019   HGB 14.0 05/12/2019   HCT 40.9 05/12/2019   MCV 87 05/12/2019   PLT 244 05/12/2019   Lab Results  Component Value Date   NA 140 05/11/2020   K 3.5 05/11/2020   CO2 24 05/11/2020   GLUCOSE 112 (H) 05/11/2020   BUN 15 05/11/2020   CREATININE 1.15 (H) 05/11/2020   BILITOT 0.6 05/11/2020   ALKPHOS 60 05/11/2020   AST 21 05/11/2020   ALT 14 05/11/2020   PROT 7.4 05/11/2020   ALBUMIN 4.9 (H) 05/11/2020    CALCIUM 9.5 05/11/2020   ANIONGAP 6 06/18/2016   EGFR 49 (L) 05/11/2020   Lab Results  Component Value Date   CHOL 198 05/11/2020   Lab Results  Component Value Date   HDL 89 05/11/2020   Lab Results  Component Value Date   LDLCALC 87 05/11/2020   Lab Results  Component Value Date   TRIG 133 05/11/2020   Lab Results  Component Value Date   CHOLHDL 2.2 05/11/2020   No results found for: HGBA1C

## 2020-11-20 ENCOUNTER — Encounter: Payer: Self-pay | Admitting: Family Medicine

## 2020-11-20 ENCOUNTER — Telehealth: Payer: Self-pay | Admitting: Family Medicine

## 2020-11-20 NOTE — Telephone Encounter (Signed)
Can we please have the [facility] in [place] reach out to patient to schedule mammogram? Order was placed in June of this year. She previously did not schedule because she had a lot going on.

## 2020-11-22 NOTE — Telephone Encounter (Signed)
Order faxed.

## 2020-12-28 ENCOUNTER — Other Ambulatory Visit: Payer: Medicare Other

## 2020-12-29 ENCOUNTER — Ambulatory Visit (INDEPENDENT_AMBULATORY_CARE_PROVIDER_SITE_OTHER): Payer: Medicare Other

## 2020-12-29 ENCOUNTER — Other Ambulatory Visit: Payer: Self-pay | Admitting: *Deleted

## 2020-12-29 ENCOUNTER — Other Ambulatory Visit: Payer: Self-pay

## 2020-12-29 DIAGNOSIS — I701 Atherosclerosis of renal artery: Secondary | ICD-10-CM

## 2021-01-09 ENCOUNTER — Other Ambulatory Visit: Payer: Self-pay | Admitting: Family Medicine

## 2021-01-09 DIAGNOSIS — J302 Other seasonal allergic rhinitis: Secondary | ICD-10-CM

## 2021-01-09 DIAGNOSIS — K219 Gastro-esophageal reflux disease without esophagitis: Secondary | ICD-10-CM

## 2021-01-11 ENCOUNTER — Other Ambulatory Visit: Payer: Medicare Other

## 2021-01-30 ENCOUNTER — Other Ambulatory Visit: Payer: Self-pay | Admitting: Family Medicine

## 2021-01-30 DIAGNOSIS — I10 Essential (primary) hypertension: Secondary | ICD-10-CM

## 2021-02-15 ENCOUNTER — Ambulatory Visit (INDEPENDENT_AMBULATORY_CARE_PROVIDER_SITE_OTHER): Payer: Medicare Other | Admitting: Family Medicine

## 2021-02-15 ENCOUNTER — Encounter: Payer: Self-pay | Admitting: Family Medicine

## 2021-02-15 VITALS — BP 131/63 | HR 58 | Temp 96.9°F | Ht 63.5 in | Wt 125.0 lb

## 2021-02-15 DIAGNOSIS — E785 Hyperlipidemia, unspecified: Secondary | ICD-10-CM | POA: Diagnosis not present

## 2021-02-15 DIAGNOSIS — R600 Localized edema: Secondary | ICD-10-CM

## 2021-02-15 DIAGNOSIS — M150 Primary generalized (osteo)arthritis: Secondary | ICD-10-CM

## 2021-02-15 DIAGNOSIS — I701 Atherosclerosis of renal artery: Secondary | ICD-10-CM | POA: Diagnosis not present

## 2021-02-15 DIAGNOSIS — M810 Age-related osteoporosis without current pathological fracture: Secondary | ICD-10-CM

## 2021-02-15 DIAGNOSIS — N1832 Chronic kidney disease, stage 3b: Secondary | ICD-10-CM

## 2021-02-15 DIAGNOSIS — I6522 Occlusion and stenosis of left carotid artery: Secondary | ICD-10-CM | POA: Diagnosis not present

## 2021-02-15 DIAGNOSIS — E559 Vitamin D deficiency, unspecified: Secondary | ICD-10-CM

## 2021-02-15 DIAGNOSIS — Z8673 Personal history of transient ischemic attack (TIA), and cerebral infarction without residual deficits: Secondary | ICD-10-CM

## 2021-02-15 DIAGNOSIS — Z23 Encounter for immunization: Secondary | ICD-10-CM

## 2021-02-15 DIAGNOSIS — K219 Gastro-esophageal reflux disease without esophagitis: Secondary | ICD-10-CM

## 2021-02-15 DIAGNOSIS — I10 Essential (primary) hypertension: Secondary | ICD-10-CM | POA: Diagnosis not present

## 2021-02-15 DIAGNOSIS — M159 Polyosteoarthritis, unspecified: Secondary | ICD-10-CM

## 2021-02-15 MED ORDER — TRAMADOL HCL 50 MG PO TABS: 50.0000 mg | ORAL_TABLET | Freq: Two times a day (BID) | ORAL | 0 refills | Status: DC | PRN

## 2021-02-15 MED ORDER — FUROSEMIDE 40 MG PO TABS: 40.0000 mg | ORAL_TABLET | Freq: Two times a day (BID) | ORAL | 1 refills | Status: DC

## 2021-02-15 MED ORDER — HYDRALAZINE HCL 10 MG PO TABS: 20.0000 mg | ORAL_TABLET | Freq: Three times a day (TID) | ORAL | 1 refills | Status: DC

## 2021-02-15 MED ORDER — ROSUVASTATIN CALCIUM 40 MG PO TABS
40.0000 mg | ORAL_TABLET | Freq: Every day | ORAL | 1 refills | Status: DC
Start: 2021-02-15 — End: 2021-08-14

## 2021-02-15 MED ORDER — PANTOPRAZOLE SODIUM 40 MG PO TBEC: DELAYED_RELEASE_TABLET | ORAL | 1 refills | Status: DC

## 2021-02-15 MED ORDER — CLOPIDOGREL BISULFATE 75 MG PO TABS: 75.0000 mg | ORAL_TABLET | Freq: Every day | ORAL | 1 refills | Status: DC

## 2021-02-15 MED ORDER — AMLODIPINE BESYLATE 5 MG PO TABS: 5.0000 mg | ORAL_TABLET | Freq: Every day | ORAL | 1 refills | Status: DC

## 2021-02-15 NOTE — Progress Notes (Signed)
Assessment & Plan:  1. Essential (primary) hypertension - Well controlled on current regimen - amLODipine (NORVASC) 5 MG tablet; Take 1 tablet (5 mg total) by mouth daily.  Dispense: 90 tablet; Refill: 1 - hydrALAZINE (APRESOLINE) 10 MG tablet; Take 2 tablets (20 mg total) by mouth 3 (three) times daily.  Dispense: 540 tablet; Refill: 1 - Lipid panel - CBC with Differential/Platelet - CMP14+EGFR  2. Renal artery stenosis (HCC) - stop pravastatin and start rosuvastatin as a high intensity statin - CMP14+EGFR - rosuvastatin (CRESTOR) 40 MG tablet; Take 1 tablet (40 mg total) by mouth daily.  Dispense: 90 tablet; Refill: 1  3. Occlusion and stenosis of left carotid artery - stop pravastatin and start rosuvastatin as a high intensity statin - encouraged to call cardiology and see if she can get an appointment sooner than the end of February for her complaint of dizziness as she may now be symptomatic - clopidogrel (PLAVIX) 75 MG tablet; Take 1 tablet (75 mg total) by mouth daily.  Dispense: 90 tablet; Refill: 1 - Lipid panel - CMP14+EGFR - rosuvastatin (CRESTOR) 40 MG tablet; Take 1 tablet (40 mg total) by mouth daily.  Dispense: 90 tablet; Refill: 1  4. Hyperlipidemia, unspecified hyperlipidemia type - stop pravastatin and start rosuvastatin as a high intensity statin - Lipid panel - CMP14+EGFR - rosuvastatin (CRESTOR) 40 MG tablet; Take 1 tablet (40 mg total) by mouth daily.  Dispense: 90 tablet; Refill: 1  5. History of CVA (cerebrovascular accident) - stop pravastatin and start rosuvastatin as a high intensity statin - clopidogrel (PLAVIX) 75 MG tablet; Take 1 tablet (75 mg total) by mouth daily.  Dispense: 90 tablet; Refill: 1 - CBC with Differential/Platelet - CMP14+EGFR - rosuvastatin (CRESTOR) 40 MG tablet; Take 1 tablet (40 mg total) by mouth daily.  Dispense: 90 tablet; Refill: 1  6. Lower extremity edema - improved on lasix; will monitor potassium level since she is not  on a supplement - furosemide (LASIX) 40 MG tablet; Take 1 tablet (40 mg total) by mouth 2 (two) times daily.  Dispense: 180 tablet; Refill: 1 - CMP14+EGFR  7. Gastroesophageal reflux disease, unspecified whether esophagitis present - Well controlled on current regimen - pantoprazole (PROTONIX) 40 MG tablet; 1 tablet daily  Dispense: 90 tablet; Refill: 1 - CMP14+EGFR  8. Vitamin D deficiency - VITAMIN D 25 Hydroxy (Vit-D Deficiency, Fractures)  9. Age-related osteoporosis without current pathological fracture - continue fosamax and vitamin D  10. Primary osteoarthritis involving multiple joints - trial of tramadol for symptom relief, will need to return for UDS and controlled substance contract if she she wants to continue.  - she is very hesitant to start an opioid so she will decide if she wants to fill this or not - CMP14+EGFR - traMADol (ULTRAM) 50 MG tablet; Take 1 tablet (50 mg total) by mouth every 12 (twelve) hours as needed.  Dispense: 10 tablet; Refill: 0  11. Stage 3b chronic kidney disease ([place]) - followed by nephrology - CMP14+EGFR  12. Immunization due - shingrix vaccine today   Return in about 4 months (around 06/15/2021) for follow-up of chronic medication conditions.  [name], NP Student  I personally was present during the history, physical exam, and medical decision-making activities of this service and have verified that the service and findings are accurately documented in the nurse practitioner student's note.  Hendricks Limes, MSN, APRN, FNP-C [facility]  Subjective:    Patient ID: Tamara King, female  DOB: November 16, 1940, 81 y.o.   MRN: [id number]  Patient Care Team: Loman Brooklyn, FNP as PCP - General (Family Medicine) Satira Sark, MD as PCP - Cardiology (Cardiology) Liana Gerold, MD as Consulting Physician (Nephrology) Wellington Hampshire, MD as Consulting Physician (Cardiology)   Chief Complaint:  Chief  Complaint  Patient presents with   Medical Management of Chronic Issues   Shoulder Pain    Right- ongoing but has gotten worse     HPI: Tamara King is a 81 y.o. female presenting on 02/15/2021 for Medical Management of Chronic Issues and Shoulder Pain (Right- ongoing but has gotten worse )  Renal artery stenosis: Managed by Dr. Fletcher Anon.  Patient had a balloon angioplasty of the left renal artery in-stent restenosis.  Velocities on Doppler showed patent bilateral renal artery stent with no significant restenosis. She was last seen on 09/13/2020 at which time a repeat doppler was ordered to be completed in November, I cannot see where this has been completed. Her ASA was discontinued and she was continued on Plavix.   Carotid artery disease: managed by Dr. Domenic Polite, cardiologist. Carotid dopplers repeated in March which are stable. LICA occluded as before and only mild RICA stenosis. The plan is to repeat her carotid dopplers in March of next year. She has a follow-up appointment with Dr. Domenic Polite scheduled for 03/21/2021.  Hypertension: managed by cardiology and nephrology. Amlodipine dose was decreased by nephrology in June 2022 due to her edema per patient.   Hyperlipidemia: Patient is taking pravastatin nightly. It was recommended by Dr. Fletcher Anon that a more potent statin be considered. At our last visit patient was hesitant to make this change until she had lab work, which she did not return for. After discussion, she is agreeable today.  Lower extremity edema: taking Lasix 40 mg twice daily. She does not elevate her feet because she is constantly going and taking care of others. She does not wear compression hose because she reports they hurt. She is not taking a potassium supplement.   History of CVA: Patient has continued with clopidogrel.  GERD: Patient is taking both Protonix and famotidine.  Vitamin D deficiency: Patient was diagnosed with vitamin D deficiency by her nephrologist.  She is  on a daily supplement.   Osteoporosis: Patient is taking Fosamax and Vitamin D.   Osteoarthritis: Patient only takes Tylenol for pain. Unable to take NSAIDs. Her right shoulder is the most painful for her.   CKD: Managed by Dr. Theador Hawthorne, nephrologist, who she saw on 01/04/2021. He did not make any changes.  Health maintenance: she has a mammogram scheduled for next month. She is agreeable to shingles vaccine today.  New complaints: She reports intermittent dizziness that comes and goes without regard to movement or position. She states that she can be doing an activity and her vision will get a little dark and she feels dizzy, but has not had any falls.   She is not agreeable to PHQ and GAD assessments today. She states she looked at the questions and felt that they would not help her. She is experiencing grief from the loss of her son in September and states that she expects to be sad about this.    Social history:  Relevant past medical, surgical, family and social history reviewed and updated as indicated. Interim medical history since our last visit reviewed.  Allergies and medications reviewed and updated.  DATA REVIEWED: CHART IN EPIC  ROS: Negative unless specifically indicated above in  HPI.    Current Outpatient Medications:    acetaminophen (TYLENOL) 500 MG tablet, Take 500 mg by mouth every 6 (six) hours as needed for moderate pain., Disp: , Rfl:    albuterol (VENTOLIN HFA) 108 (90 Base) MCG/ACT inhaler, INHALE 2 PUFFS BY MOUTH EVERY 6 HOURS AS NEEDED FOR WHEEZING AND FOR SHORTNESS OF BREATH, Disp: 18 g, Rfl: 2   alendronate (FOSAMAX) 70 MG tablet, Take 1 tablet (70 mg total) by mouth every 7 (seven) days. Take with a full glass of water on an empty stomach., Disp: 4 tablet, Rfl: 5   carvedilol (COREG) 25 MG tablet, Take 1 tablet by mouth twice daily, Disp: 180 tablet, Rfl: 0   cholecalciferol (VITAMIN D3) 25 MCG (1000 UNIT) tablet, Take 1,000 Units by mouth daily., Disp: ,  Rfl:    EQ ALLERGY RELIEF, CETIRIZINE, 10 MG tablet, Take 1 tablet by mouth once daily, Disp: 90 tablet, Rfl: 0   famotidine (PEPCID) 20 MG tablet, Take 1 tablet by mouth once daily, Disp: 90 tablet, Rfl: 0   fluticasone (FLONASE) 50 MCG/ACT nasal spray, Place 2 sprays into the nose daily as needed for allergies (congestion.). , Disp: , Rfl:    ondansetron (ZOFRAN) 4 MG tablet, TAKE 1 TABLET BY MOUTH EVERY 8 HOURS AS NEEDED FOR NAUSEA OR VOMITING, Disp: 20 tablet, Rfl: 0   rosuvastatin (CRESTOR) 40 MG tablet, Take 1 tablet (40 mg total) by mouth daily., Disp: 90 tablet, Rfl: 1   Tetrahydrozoline HCl (VISINE OP), Place 2 drops into both eyes daily as needed (dry eyes)., Disp: , Rfl:    traMADol (ULTRAM) 50 MG tablet, Take 1 tablet (50 mg total) by mouth every 12 (twelve) hours as needed., Disp: 10 tablet, Rfl: 0   amLODipine (NORVASC) 5 MG tablet, Take 1 tablet (5 mg total) by mouth daily., Disp: 90 tablet, Rfl: 1   clopidogrel (PLAVIX) 75 MG tablet, Take 1 tablet (75 mg total) by mouth daily., Disp: 90 tablet, Rfl: 1   furosemide (LASIX) 40 MG tablet, Take 1 tablet (40 mg total) by mouth 2 (two) times daily., Disp: 180 tablet, Rfl: 1   hydrALAZINE (APRESOLINE) 10 MG tablet, Take 2 tablets (20 mg total) by mouth 3 (three) times daily., Disp: 540 tablet, Rfl: 1   pantoprazole (PROTONIX) 40 MG tablet, 1 tablet daily, Disp: 90 tablet, Rfl: 1   Allergies  Allergen Reactions   Losartan Anaphylaxis   Sulfa Antibiotics Anaphylaxis and Swelling   Codeine Nausea And Vomiting   Fish Allergy Other (See Comments)    "sick as a dog"   Other Other (See Comments)    Bologna - vomiting   Spironolactone Other (See Comments)    Acute kidney injury   Vancomycin Other (See Comments)    "drives me crazy"   Penicillins Rash    Has patient had a PCN reaction causing immediate rash, facial/tongue/throat swelling, SOB or lightheadedness with hypotension: Unknown Has patient had a PCN reaction causing severe rash  involving mucus membranes or skin necrosis: Unknown Has patient had a PCN reaction that required hospitalization: No Has patient had a PCN reaction occurring within the last 10 years: No Childhood reaction. If all of the above answers are "NO", then may proceed with Cephalosporin use.    Past Medical History:  Diagnosis Date   Breast cancer ([place])    Adenocarcinoma   Chronic edema    CKD (chronic kidney disease) stage 3, GFR 30-59 ml/min (HCC) 10/22/2018   Coronary atherosclerosis of native coronary  artery    Nonobstructive at catheterization 2003, LVEF normal    Essential hypertension    GERD (gastroesophageal reflux disease) 07/06/2013   Idiopathic peripheral neuropathy 10/06/2013   Mixed hyperlipidemia    Occlusion and stenosis of left carotid artery 10/22/2017   Renal artery stenosis (HCC)    BMS bilateally 2003, PTCA bilaterally 2005 with restenosis   Sleep apnea    Stop Bang score of 4   Stroke [facility])    Symptomatic varicose veins, right 10/16/2018    Past Surgical History:  Procedure Laterality Date   APPENDECTOMY     CATARACT EXTRACTION W/PHACO  03/29/2011   Procedure: CATARACT EXTRACTION PHACO AND INTRAOCULAR LENS PLACEMENT ([place]);  Surgeon: Tonny Branch, MD;  Location: [facility];  Service: Ophthalmology;  Laterality: Right;  CDE:12.82   CATARACT EXTRACTION W/PHACO  04/16/2011   Procedure: CATARACT EXTRACTION PHACO AND INTRAOCULAR LENS PLACEMENT ([facility]);  Surgeon: Tonny Branch, MD;  Location: [facility];  Service: Ophthalmology;  Laterality: Left;  CDE: 11.54   CESAREAN SECTION     x2   CHOLECYSTECTOMY  2018   HEMORRHOID SURGERY     RENAL ANGIOGRAPHY N/A 05/20/2019   Procedure: RENAL ANGIOGRAPHY;  Surgeon: Wellington Hampshire, MD;  Location: [place] [facility];  Service: Cardiovascular;  Laterality: N/A;   RENAL INTERVENTION  05/20/2019   Procedure: RENAL INTERVENTION;  Surgeon: Wellington Hampshire, MD;  Location: [place] [facility];  Service: Cardiovascular;;   Right breast lumpectomy      ROTATOR CUFF REPAIR  2008   UPPER EXTREMITY ANGIOGRAPHY N/A 11/27/2017   Procedure: UPPER EXTREMITY ANGIOGRAPHY;  Surgeon: Marty Heck, MD;  Location: [place] [facility];  Service: Cardiovascular;  Laterality: N/A;    Social History   Socioeconomic History   Marital status: Widowed    Spouse name: Not on file   Number of children: 2   Years of education: 12th   Highest education level: 12th grade  Occupational History    Employer: RETIRED    Comment: n/a  Tobacco Use   Smoking status: Former    Packs/day: 1.00    Years: 35.00    Pack years: 35.00    Types: Cigarettes    Quit date: 01/22/1998    Years since quitting: 23.0   Smokeless tobacco: Never   Tobacco comments:    tobacco use - no  Vaping Use   Vaping Use: Never used  Substance and Sexual Activity   Alcohol use: No   Drug use: No   Sexual activity: Not on file  Other Topics Concern   Not on file  Social History Narrative   Patient lives at home with family.  Two sons and two granddaughters live with her.   Caffeine use; 2-5 cups daily   Oldest son has cancer   Social Determinants of Health   Financial Resource Strain: Low Risk    Difficulty of Paying Living Expenses: Not hard at all  Food Insecurity: No Food Insecurity   Worried About Charity fundraiser in the Last Year: Never true   [place] in the Last Year: Never true  Transportation Needs: No Transportation Needs   Lack of Transportation (Medical): No   Lack of Transportation (Non-Medical): No  Physical Activity: Inactive   Days of Exercise per Week: 0 days   Minutes of Exercise per Session: 0 min  Stress: Stress Concern Present   Feeling of Stress : Rather much  Social Connections: Socially Isolated   Frequency of Communication  with Friends and Family: More than three times a week   Frequency of Social Gatherings with Friends and Family: Once a week   Attends Religious Services: Never   Marine scientist or Organizations: No    Attends Archivist Meetings: Never   Marital Status: Widowed  Human resources officer Violence: Not At Risk   Fear of Current or Ex-Partner: No   Emotionally Abused: No   Physically Abused: No   Sexually Abused: No        Objective:    BP 131/63    Pulse (!) 58    Temp (!) 96.9 F (36.1 C) (Temporal)    Ht 5' 3.5" (1.613 m)    Wt 56.7 kg    SpO2 95%    BMI 21.80 kg/m   Wt Readings from Last 3 Encounters:  02/15/21 125 lb (56.7 kg)  11/15/20 127 lb (57.6 kg)  09/16/20 129 lb (58.5 kg)   Physical Exam Vitals reviewed.  Constitutional:      General: She is not in acute distress.    Appearance: Normal appearance. She is normal weight. She is not ill-appearing, toxic-appearing or diaphoretic.  HENT:     Head: Normocephalic and atraumatic.  Eyes:     General: No scleral icterus.       Right eye: No discharge.        Left eye: No discharge.     Conjunctiva/sclera: Conjunctivae normal.  Cardiovascular:     Rate and Rhythm: Normal rate and regular rhythm.     Heart sounds: Normal heart sounds. No murmur heard.   No friction rub. No gallop.  Pulmonary:     Effort: Pulmonary effort is normal. No respiratory distress.     Breath sounds: Normal breath sounds. No stridor. No wheezing, rhonchi or rales.  Musculoskeletal:        General: Normal range of motion.     Cervical back: Normal range of motion.     Right lower leg: Edema present.     Left lower leg: Edema present.  Skin:    General: Skin is warm and dry.     Capillary Refill: Capillary refill takes less than 2 seconds.  Neurological:     General: No focal deficit present.     Mental Status: She is alert and oriented to person, place, and time. Mental status is at baseline.  Psychiatric:        Mood and Affect: Mood normal.        Behavior: Behavior normal.        Thought Content: Thought content normal.        Judgment: Judgment normal.    Lab Results  Component Value Date   TSH 2.000 05/11/2020   Lab Results   Component Value Date   WBC 7.3 05/12/2019   HGB 14.0 05/12/2019   HCT 40.9 05/12/2019   MCV 87 05/12/2019   PLT 244 05/12/2019   Lab Results  Component Value Date   NA 140 05/11/2020   K 3.5 05/11/2020   CO2 24 05/11/2020   GLUCOSE 112 (H) 05/11/2020   BUN 15 05/11/2020   CREATININE 1.15 (H) 05/11/2020   BILITOT 0.6 05/11/2020   ALKPHOS 60 05/11/2020   AST 21 05/11/2020   ALT 14 05/11/2020   PROT 7.4 05/11/2020   ALBUMIN 4.9 (H) 05/11/2020   CALCIUM 9.5 05/11/2020   ANIONGAP 6 06/18/2016   EGFR 49 (L) 05/11/2020   Lab Results  Component Value Date   CHOL  198 05/11/2020   Lab Results  Component Value Date   HDL 89 05/11/2020   Lab Results  Component Value Date   LDLCALC 87 05/11/2020   Lab Results  Component Value Date   TRIG 133 05/11/2020   Lab Results  Component Value Date   CHOLHDL 2.2 05/11/2020   No results found for: HGBA1C

## 2021-02-15 NOTE — Patient Instructions (Signed)
Please call cardiology and see if they can see you sooner due to new onset dizziness and vision changes with you carotid artery stenosis.

## 2021-02-16 LAB — CBC WITH DIFFERENTIAL/PLATELET
Basophils Absolute: 0.1 10*3/uL (ref 0.0–0.2)
Basos: 1 %
EOS (ABSOLUTE): 0.2 10*3/uL (ref 0.0–0.4)
Eos: 3 %
Hematocrit: 39.5 % (ref 34.0–46.6)
Hemoglobin: 13.4 g/dL (ref 11.1–15.9)
Immature Grans (Abs): 0 10*3/uL (ref 0.0–0.1)
Immature Granulocytes: 0 %
Lymphocytes Absolute: 2.6 10*3/uL (ref 0.7–3.1)
Lymphs: 33 %
MCH: 29.8 pg (ref 26.6–33.0)
MCHC: 33.9 g/dL (ref 31.5–35.7)
MCV: 88 fL (ref 79–97)
Monocytes Absolute: 0.6 10*3/uL (ref 0.1–0.9)
Monocytes: 8 %
Neutrophils Absolute: 4.5 10*3/uL (ref 1.4–7.0)
Neutrophils: 55 %
Platelets: 232 10*3/uL (ref 150–450)
RBC: 4.5 x10E6/uL (ref 3.77–5.28)
RDW: 12.8 % (ref 11.7–15.4)
WBC: 8 10*3/uL (ref 3.4–10.8)

## 2021-02-16 LAB — CMP14+EGFR
ALT: 11 IU/L (ref 0–32)
AST: 18 IU/L (ref 0–40)
Albumin/Globulin Ratio: 1.8 (ref 1.2–2.2)
Albumin: 4.4 g/dL (ref 3.7–4.7)
Alkaline Phosphatase: 66 IU/L (ref 44–121)
BUN/Creatinine Ratio: 11 — ABNORMAL LOW (ref 12–28)
BUN: 11 mg/dL (ref 8–27)
Bilirubin Total: 0.4 mg/dL (ref 0.0–1.2)
CO2: 25 mmol/L (ref 20–29)
Calcium: 9.5 mg/dL (ref 8.7–10.3)
Chloride: 100 mmol/L (ref 96–106)
Creatinine, Ser: 1 mg/dL (ref 0.57–1.00)
Globulin, Total: 2.5 g/dL (ref 1.5–4.5)
Glucose: 116 mg/dL — ABNORMAL HIGH (ref 70–99)
Potassium: 3.8 mmol/L (ref 3.5–5.2)
Sodium: 142 mmol/L (ref 134–144)
Total Protein: 6.9 g/dL (ref 6.0–8.5)
eGFR: 58 mL/min/{1.73_m2} — ABNORMAL LOW (ref >59)

## 2021-02-16 LAB — LIPID PANEL
Chol/HDL Ratio: 2.2 ratio (ref 0.0–4.4)
Cholesterol, Total: 183 mg/dL (ref 100–199)
HDL: 85 mg/dL (ref >39)
LDL Chol Calc (NIH): 84 mg/dL (ref 0–99)
Triglycerides: 75 mg/dL (ref 0–149)
VLDL Cholesterol Cal: 14 mg/dL (ref 5–40)

## 2021-02-16 LAB — VITAMIN D 25 HYDROXY (VIT D DEFICIENCY, FRACTURES): Vit D, 25-Hydroxy: 41.4 ng/mL (ref 30.0–100.0)

## 2021-03-21 ENCOUNTER — Ambulatory Visit (INDEPENDENT_AMBULATORY_CARE_PROVIDER_SITE_OTHER): Payer: Medicare Other | Admitting: Cardiology

## 2021-03-21 ENCOUNTER — Encounter: Payer: Self-pay | Admitting: Cardiology

## 2021-03-21 VITALS — BP 132/64 | HR 62 | Ht 63.5 in | Wt 126.2 lb

## 2021-03-21 DIAGNOSIS — I6523 Occlusion and stenosis of bilateral carotid arteries: Secondary | ICD-10-CM

## 2021-03-21 DIAGNOSIS — I701 Atherosclerosis of renal artery: Secondary | ICD-10-CM | POA: Diagnosis not present

## 2021-03-21 DIAGNOSIS — R0602 Shortness of breath: Secondary | ICD-10-CM | POA: Diagnosis not present

## 2021-03-21 DIAGNOSIS — I10 Essential (primary) hypertension: Secondary | ICD-10-CM

## 2021-03-21 DIAGNOSIS — R011 Cardiac murmur, unspecified: Secondary | ICD-10-CM

## 2021-03-21 NOTE — Patient Instructions (Signed)
Medication Instructions:  Your physician recommends that you continue on your current medications as directed. Please refer to the Current Medication list given to you today.   Labwork: none  Testing/Procedures: Your physician has requested that you have an echocardiogram. Echocardiography is a painless test that uses sound waves to create images of your heart. It provides your doctor with information about the size and shape of your heart and how well your hearts chambers and valves are working. This procedure takes approximately one hour. There are no restrictions for this procedure.   Your physician has requested that you have a carotid duplex. This test is an ultrasound of the carotid arteries in your neck. It looks at blood flow through these arteries that supply the brain with blood. Allow one hour for this exam. There are no restrictions or special instructions.   Follow-Up: Your physician recommends that you schedule a follow-up appointment in: 6 months  Any Other Special Instructions Will Be Listed Below (If Applicable).  If you need a refill on your cardiac medications before your next appointment, please call your pharmacy.

## 2021-03-21 NOTE — Progress Notes (Signed)
Cardiology Office Note  Date: 03/21/2021   ID: Daylan, Bogg[place] Sep 15, 1940, MRN [id number]  PCP:  Loman Brooklyn, FNP  Cardiologist:  Rozann Lesches, MD Electrophysiologist:  None   Chief Complaint  Patient presents with   Cardiac follow-up    History of Present Illness: Tamara King is a 81 y.o. female last seen in August 2022.  She is here for a routine visit.  Reports no chest pain, chronic dyspnea on exertion at NYHA class II-III.  She states that she has been taking her medications regularly.  It sounds like her home blood pressure monitor is not working correctly, she plans to get a new one.  Today's a systolic was in the 308M.  She is due for follow-up carotid Dopplers, we also plan to obtain an echocardiogram for follow-up of heart murmur and shortness of breath.  I reviewed her medications which are outlined below.  Past Medical History:  Diagnosis Date   Breast cancer ([place])    Adenocarcinoma   Chronic edema    CKD (chronic kidney disease) stage 3, GFR 30-59 ml/min (HCC) 10/22/2018   Coronary atherosclerosis of native coronary artery    Nonobstructive at catheterization 2003, LVEF normal    Essential hypertension    GERD (gastroesophageal reflux disease) 07/06/2013   Idiopathic peripheral neuropathy 10/06/2013   Mixed hyperlipidemia    Occlusion and stenosis of left carotid artery 10/22/2017   Renal artery stenosis (HCC)    BMS bilateally 2003, PTCA bilaterally 2005 with restenosis   Sleep apnea    Stop Bang score of 4   Stroke [facility])    Symptomatic varicose veins, right 10/16/2018    Past Surgical History:  Procedure Laterality Date   APPENDECTOMY     CATARACT EXTRACTION W/PHACO  03/29/2011   Procedure: CATARACT EXTRACTION PHACO AND INTRAOCULAR LENS PLACEMENT ([place]);  Surgeon: Tonny Branch, MD;  Location: [facility];  Service: Ophthalmology;  Laterality: Right;  CDE:12.82   CATARACT EXTRACTION W/PHACO  04/16/2011   Procedure: CATARACT EXTRACTION PHACO AND  INTRAOCULAR LENS PLACEMENT ([facility]);  Surgeon: Tonny Branch, MD;  Location: [facility];  Service: Ophthalmology;  Laterality: Left;  CDE: 11.54   CESAREAN SECTION     x2   CHOLECYSTECTOMY  2018   HEMORRHOID SURGERY     RENAL ANGIOGRAPHY N/A 05/20/2019   Procedure: RENAL ANGIOGRAPHY;  Surgeon: Wellington Hampshire, MD;  Location: [place] [facility];  Service: Cardiovascular;  Laterality: N/A;   RENAL INTERVENTION  05/20/2019   Procedure: RENAL INTERVENTION;  Surgeon: Wellington Hampshire, MD;  Location: [place] [facility];  Service: Cardiovascular;;   Right breast lumpectomy     ROTATOR CUFF REPAIR  2008   UPPER EXTREMITY ANGIOGRAPHY N/A 11/27/2017   Procedure: UPPER EXTREMITY ANGIOGRAPHY;  Surgeon: Marty Heck, MD;  Location: [place] [facility];  Service: Cardiovascular;  Laterality: N/A;    Current Outpatient Medications  Medication Sig Dispense Refill   acetaminophen (TYLENOL) 500 MG tablet Take 500 mg by mouth every 6 (six) hours as needed for moderate pain.     albuterol (VENTOLIN HFA) 108 (90 Base) MCG/ACT inhaler INHALE 2 PUFFS BY MOUTH EVERY 6 HOURS AS NEEDED FOR WHEEZING AND FOR SHORTNESS OF BREATH 18 g 2   alendronate (FOSAMAX) 70 MG tablet Take 1 tablet (70 mg total) by mouth every 7 (seven) days. Take with a full glass of water on an empty stomach. 4 tablet 5   amLODipine (NORVASC) 5 MG tablet Take 1 tablet (5  mg total) by mouth daily. 90 tablet 1   carvedilol (COREG) 25 MG tablet Take 1 tablet by mouth twice daily 180 tablet 0   cholecalciferol (VITAMIN D3) 25 MCG (1000 UNIT) tablet Take 1,000 Units by mouth daily.     clopidogrel (PLAVIX) 75 MG tablet Take 1 tablet (75 mg total) by mouth daily. 90 tablet 1   EQ ALLERGY RELIEF, CETIRIZINE, 10 MG tablet Take 1 tablet by mouth once daily 90 tablet 0   famotidine (PEPCID) 20 MG tablet Take 1 tablet by mouth once daily 90 tablet 0   fluticasone (FLONASE) 50 MCG/ACT nasal spray Place 2 sprays into the nose daily as needed for allergies  (congestion.).      furosemide (LASIX) 40 MG tablet Take 1 tablet (40 mg total) by mouth 2 (two) times daily. 180 tablet 1   hydrALAZINE (APRESOLINE) 10 MG tablet Take 2 tablets (20 mg total) by mouth 3 (three) times daily. 540 tablet 1   ondansetron (ZOFRAN) 4 MG tablet TAKE 1 TABLET BY MOUTH EVERY 8 HOURS AS NEEDED FOR NAUSEA OR VOMITING 20 tablet 0   pantoprazole (PROTONIX) 40 MG tablet 1 tablet daily 90 tablet 1   rosuvastatin (CRESTOR) 40 MG tablet Take 1 tablet (40 mg total) by mouth daily. 90 tablet 1   Tetrahydrozoline HCl (VISINE OP) Place 2 drops into both eyes daily as needed (dry eyes).     traMADol (ULTRAM) 50 MG tablet Take 1 tablet (50 mg total) by mouth every 12 (twelve) hours as needed. 10 tablet 0   No current facility-administered medications for this visit.   Allergies:  Losartan, Sulfa antibiotics, Codeine, Fish allergy, Other, Spironolactone, Vancomycin, and Penicillins   ROS: No palpitations or syncope.  Physical Exam: VS:  BP 132/64    Pulse 62    Ht 5' 3.5" (1.613 m)    Wt 126 lb 3.2 oz (57.2 kg)    SpO2 95%    BMI 22.00 kg/m , BMI Body mass index is 22 kg/m.  Wt Readings from Last 3 Encounters:  03/21/21 126 lb 3.2 oz (57.2 kg)  02/15/21 125 lb (56.7 kg)  11/15/20 127 lb (57.6 kg)    General: Patient appears comfortable at rest. HEENT: Conjunctiva and lids normal, wearing a mask. Neck: Supple, bilateral carotid bruits, no thyromegaly. Lungs: Clear to auscultation, nonlabored breathing at rest. Cardiac: Regular rate and rhythm, no S3, 2/6 systolic murmur. Extremities: No pitting edema.  ECG:  An ECG dated 09/13/2020 was personally reviewed today and demonstrated:  Sinus rhythm with prolonged PR interval and left bundle branch block.  Recent Labwork: 05/11/2020: TSH 2.000 02/15/2021: ALT 11; AST 18; BUN 11; Creatinine, Ser 1.00; Hemoglobin 13.4; Platelets 232; Potassium 3.8; Sodium 142     Component Value Date/Time   CHOL 183 02/15/2021 1054   TRIG 75  02/15/2021 1054   HDL 85 02/15/2021 1054   CHOLHDL 2.2 02/15/2021 1054   LDLCALC 84 02/15/2021 1054    Other Studies Reviewed Today:  Carotid Dopplers 04/06/2020: Summary:  Right Carotid: Velocities in the right ICA are consistent with a 1-39%  stenosis.   Left Carotid: Evidence consistent with a total occlusion of the left ICA.   Vertebrals:  Bilateral vertebral arteries demonstrate antegrade flow.  Subclavians: Right subclavian artery was stenotic. Normal flow  hemodynamics were               seen in the left subclavian artery.   Renal artery Dopplers 12/29/2020: Summary:  Largest Aortic Diameter: 1.8  cm     Renal:     Right: Abnormal size for the right kidney. No evidence of right         renal artery stenosis. RRV flow present.  Left:  Normal size of left kidney. No evidence of left renal artery         stenosis. LRV flow present.  Mesenteric:  Normal Celiac artery and Superior Mesenteric artery findings.   Assessment and Plan:  1.  Bilateral carotid artery disease with occluded LICA and mild R ICA stenosis by last assessment.  She is due for follow-up carotid Dopplers.  Continue Crestor and Plavix.  2.  Renal artery stenosis status post bilateral stent intervention and angioplasty due to in-stent restenosis.  She continues to follow with Dr. Lavina Hamman.  Carotid Dopplers from December are outlined above.  3.  Essential hypertension, continue current regimen including Coreg, Norvasc, and hydralazine.  I encouraged her to get a new home blood pressure cuff.  4.  Shortness of breath and cardiac murmur.  Obtain follow-up echocardiogram.   Medication Adjustments/Labs and Tests Ordered: Current medicines are reviewed at length with the patient today.  Concerns regarding medicines are outlined above.   Tests Ordered: Orders Placed This Encounter  Procedures   ECHOCARDIOGRAM COMPLETE    Medication Changes: No orders of the defined types were placed in this  encounter.   Disposition:  Follow up  6 months.  Signed, Satira Sark, MD, [facility] 03/21/2021 11:39 AM    [facility] [address], [place], [place] [address] Phone: [phone]; Fax: [phone]

## 2021-03-29 ENCOUNTER — Encounter: Payer: Self-pay | Admitting: Family Medicine

## 2021-03-29 ENCOUNTER — Ambulatory Visit (INDEPENDENT_AMBULATORY_CARE_PROVIDER_SITE_OTHER): Payer: Medicare Other | Admitting: Family Medicine

## 2021-03-29 VITALS — BP 113/63 | HR 67 | Temp 97.2°F | Ht 63.5 in | Wt 125.2 lb

## 2021-03-29 DIAGNOSIS — S51012A Laceration without foreign body of left elbow, initial encounter: Secondary | ICD-10-CM

## 2021-03-29 DIAGNOSIS — I701 Atherosclerosis of renal artery: Secondary | ICD-10-CM | POA: Diagnosis not present

## 2021-03-29 DIAGNOSIS — S91312A Laceration without foreign body, left foot, initial encounter: Secondary | ICD-10-CM

## 2021-03-29 MED ORDER — MUPIROCIN 2 % EX OINT: TOPICAL_OINTMENT | CUTANEOUS | 1 refills | Status: AC

## 2021-03-29 NOTE — Progress Notes (Addendum)
? ?Subjective:  ?Patient ID: Tamara King, female    DOB: [birth year]  Age: 81 y.o. MRN: [id number] ? ?CC: Wound Check ? ? ?HPI ?Tamara King presents for Stumbled yesterday when she accidentally stepped on her cat while carrying a pan. Fell against a counter. Did not fall down. She scraped her left arm and foot .  ? ?Depression screen [facility] 2/9 03/29/2021 11/15/2020 08/11/2020  ?Decreased Interest 0 0 0  ?Down, Depressed, Hopeless 0 0 0  ?PHQ - 2 Score 0 0 0  ?Altered sleeping - 0 0  ?Tired, decreased energy - 0 0  ?Change in appetite - 0 0  ?Feeling bad or failure about yourself  - 0 0  ?Trouble concentrating - 0 0  ?Moving slowly or fidgety/restless - 0 0  ?Suicidal thoughts - 0 0  ?PHQ-9 Score - 0 0  ?Difficult doing work/chores - - Not difficult at all  ? ? ?History ?Tamara King has a past medical history of Breast cancer ([place]), Chronic edema, CKD (chronic kidney disease) stage 3, GFR 30-59 ml/min ([place]) (10/22/2018), Coronary atherosclerosis of native coronary artery, Essential hypertension, GERD (gastroesophageal reflux disease) (07/06/2013), Idiopathic peripheral neuropathy (10/06/2013), Mixed hyperlipidemia, Occlusion and stenosis of left carotid artery (10/22/2017), Renal artery stenosis [facility]), Sleep apnea, Stroke ([place]), and Symptomatic varicose veins, right (10/16/2018).  ? ?She has a past surgical history that includes Hemorrhoid surgery; Rotator cuff repair (2008); Right breast lumpectomy; Cesarean section; Appendectomy; Cataract extraction w/PHACO (03/29/2011); Cataract extraction w/PHACO (04/16/2011); Upper Extremity Angiography (N/A, 11/27/2017); Cholecystectomy (2018); RENAL ANGIOGRAPHY (N/A, 05/20/2019); and RENAL INTERVENTION (05/20/2019).  ? ?Her family history includes Brain cancer in her brother and sister; COPD in her sister; Diabetes in her brother, brother, father, sister, son, and son; Heart failure in her mother; Lung cancer in her father; Stroke (age of onset: 18) in her mother; Thyroid cancer in her  son.She reports that she quit smoking about 23 years ago. Her smoking use included cigarettes. She has a 35.00 pack-year smoking history. She has never used smokeless tobacco. She reports that she does not drink alcohol and does not use drugs. ? ? ? ?ROS ?Review of Systems  ?Constitutional:  Negative for fever.  ?Respiratory: Negative.    ?Skin:   ?     2 lesions, left foot, one at left forearm  ? ?Objective:  ?BP 113/63   Pulse 67   Temp (!) 97.2 ?F (36.2 ?C)   Ht 5' 3.5" (1.613 m)   Wt 125 lb 3.2 oz (56.8 kg)   SpO2 94%   BMI 21.83 kg/m?  ? ?BP Readings from Last 3 Encounters:  ?03/29/21 113/63  ?03/21/21 132/64  ?02/15/21 131/63  ? ? ?Wt Readings from Last 3 Encounters:  ?03/29/21 125 lb 3.2 oz (56.8 kg)  ?03/21/21 126 lb 3.2 oz (57.2 kg)  ?02/15/21 125 lb (56.7 kg)  ? ? ? ?Physical Exam ?Cardiovascular:  ?   Heart sounds: Normal heart sounds. No murmur heard. ?Pulmonary:  ?   Breath sounds: Normal breath sounds.  ?Skin: ?   General: Skin is warm and dry.  ?   Comments: There is a U-shaped skin tear at the proximal forearm on the left.  It is 2 cm in width and length.  The skin has been pulled back over it leaving a slight margin of denuded dermis.  There is bruising inside the "U ".  There is no erythema or edema or drainage. ? ?There are 2 superficial scrapes that appear to be into the  deeper epidermis but not into the dermis each of these is 3 mm and they are at the dorsal left forefoot.  There is minimal edema mild bruising no erythema and no exudate.  There is mild tenderness at each.  ? ? ? ? ?Assessment & Plan:  ? ?Tamara King was seen today for wound check. ? ?Diagnoses and all orders for this visit: ? ?Skin tear of left elbow without complication, initial encounter ? ?Laceration of dorsum of left foot ? ?Other orders ?-     mupirocin ointment (BACTROBAN) 2 %; Apply and cover with bandage twice daily ? ? ? ? ? ? ?I am having Tamara King start on mupirocin ointment. I am also having her maintain her  fluticasone, cholecalciferol, acetaminophen, Tetrahydrozoline HCl (VISINE OP), ondansetron, albuterol, alendronate, famotidine, EQ Allergy Relief (Cetirizine), carvedilol, amLODipine, clopidogrel, furosemide, hydrALAZINE, pantoprazole, rosuvastatin, and traMADol. ? ?Allergies as of 03/29/2021   ? ?   Reactions  ? Losartan Anaphylaxis  ? Sulfa Antibiotics Anaphylaxis, Swelling  ? Codeine Nausea And Vomiting  ? Fish Allergy Other (See Comments)  ? "sick as a dog"  ? Other Other (See Comments)  ? Bologna - vomiting  ? Spironolactone Other (See Comments)  ? Acute kidney injury  ? Vancomycin Other (See Comments)  ? "drives me crazy"  ? Penicillins Rash  ? Has patient had a PCN reaction causing immediate rash, facial/tongue/throat swelling, SOB or lightheadedness with hypotension: Unknown ?Has patient had a PCN reaction causing severe rash involving mucus membranes or skin necrosis: Unknown ?Has patient had a PCN reaction that required hospitalization: No ?Has patient had a PCN reaction occurring within the last 10 years: No ?Childhood reaction. ?If all of the above answers are "NO", then may proceed with Cephalosporin use.  ? ?  ? ?  ?Medication List  ?  ? ?  ? Accurate as of March 29, 2021  9:28 AM. If you have any questions, ask your nurse or doctor.  ?  ?  ? ?  ? ?acetaminophen 500 MG tablet ?Commonly known as: TYLENOL ?Take 500 mg by mouth every 6 (six) hours as needed for moderate pain. ?  ?albuterol 108 (90 Base) MCG/ACT inhaler ?Commonly known as: VENTOLIN HFA ?INHALE 2 PUFFS BY MOUTH EVERY 6 HOURS AS NEEDED FOR WHEEZING AND FOR SHORTNESS OF BREATH ?  ?alendronate 70 MG tablet ?Commonly known as: FOSAMAX ?Take 1 tablet (70 mg total) by mouth every 7 (seven) days. Take with a full glass of water on an empty stomach. ?  ?amLODipine 5 MG tablet ?Commonly known as: NORVASC ?Take 1 tablet (5 mg total) by mouth daily. ?  ?carvedilol 25 MG tablet ?Commonly known as: COREG ?Take 1 tablet by mouth twice daily ?   ?cholecalciferol 25 MCG (1000 UNIT) tablet ?Commonly known as: VITAMIN D3 ?Take 1,000 Units by mouth daily. ?  ?clopidogrel 75 MG tablet ?Commonly known as: PLAVIX ?Take 1 tablet (75 mg total) by mouth daily. ?  ?EQ Allergy Relief (Cetirizine) 10 MG tablet ?Generic drug: cetirizine ?Take 1 tablet by mouth once daily ?  ?famotidine 20 MG tablet ?Commonly known as: PEPCID ?Take 1 tablet by mouth once daily ?  ?fluticasone 50 MCG/ACT nasal spray ?Commonly known as: FLONASE ?Place 2 sprays into the nose daily as needed for allergies (congestion.). ?  ?furosemide 40 MG tablet ?Commonly known as: LASIX ?Take 1 tablet (40 mg total) by mouth 2 (two) times daily. ?  ?hydrALAZINE 10 MG tablet ?Commonly known as: APRESOLINE ?Take 2 tablets (  20 mg total) by mouth 3 (three) times daily. ?  ?mupirocin ointment 2 % ?Commonly known as: BACTROBAN ?Apply and cover with bandage twice daily ?Started by: Claretta Fraise, MD ?  ?ondansetron 4 MG tablet ?Commonly known as: ZOFRAN ?TAKE 1 TABLET BY MOUTH EVERY 8 HOURS AS NEEDED FOR NAUSEA OR VOMITING ?  ?pantoprazole 40 MG tablet ?Commonly known as: PROTONIX ?1 tablet daily ?  ?rosuvastatin 40 MG tablet ?Commonly known as: CRESTOR ?Take 1 tablet (40 mg total) by mouth daily. ?  ?traMADol 50 MG tablet ?Commonly known as: ULTRAM ?Take 1 tablet (50 mg total) by mouth every 12 (twelve) hours as needed. ?  ?[place] OP ?Place 2 drops into both eyes daily as needed (dry eyes). ?  ? ?  ? ?Tetanus was found to be up-to-date.  Wound care reviewed.  Due to her allergy to sulfa she was directed to use mupirocin for the dressing.  That should be covered with gauze.  Daily she should clean the wounds with warm soapy water and rinse with clear water.  Pat dry.  Apply mupirocin and gauze dressings.  Monitor for redness increased swelling increased pain or exudate.  Report these as they likely represent infection. ? ?Follow-up: Return if symptoms worsen or fail to improve. ? ?Claretta Fraise, M.D. ?

## 2021-04-10 ENCOUNTER — Other Ambulatory Visit: Payer: Self-pay | Admitting: Family Medicine

## 2021-04-10 DIAGNOSIS — R110 Nausea: Secondary | ICD-10-CM

## 2021-04-10 DIAGNOSIS — K219 Gastro-esophageal reflux disease without esophagitis: Secondary | ICD-10-CM

## 2021-04-10 DIAGNOSIS — J302 Other seasonal allergic rhinitis: Secondary | ICD-10-CM

## 2021-04-11 ENCOUNTER — Other Ambulatory Visit: Payer: Self-pay | Admitting: Family Medicine

## 2021-04-11 DIAGNOSIS — R110 Nausea: Secondary | ICD-10-CM

## 2021-04-11 NOTE — Telephone Encounter (Signed)
Ondansetron last filled 2021. Please review ?

## 2021-04-14 ENCOUNTER — Other Ambulatory Visit: Payer: Medicare Other

## 2021-04-18 ENCOUNTER — Ambulatory Visit (INDEPENDENT_AMBULATORY_CARE_PROVIDER_SITE_OTHER): Payer: Medicare Other

## 2021-04-18 DIAGNOSIS — R0602 Shortness of breath: Secondary | ICD-10-CM | POA: Diagnosis not present

## 2021-04-18 DIAGNOSIS — I6523 Occlusion and stenosis of bilateral carotid arteries: Secondary | ICD-10-CM

## 2021-04-19 LAB — ECHOCARDIOGRAM COMPLETE
AR max vel: 1.62 cm2
AV Area VTI: 1.64 cm2
AV Area mean vel: 1.48 cm2
AV Mean grad: 5 mmHg
AV Peak grad: 9.2 mmHg
AV Vena cont: 0.38 cm
Ao pk vel: 1.52 m/s
Area-P 1/2: 2.58 cm2
Calc EF: 66.3 %
P 1/2 time: 759 msec
S' Lateral: 2.33 cm
Single Plane A2C EF: 70.2 %
Single Plane A4C EF: 62.4 %

## 2021-04-20 ENCOUNTER — Telehealth: Payer: Self-pay | Admitting: Cardiology

## 2021-04-20 NOTE — Telephone Encounter (Signed)
Patient is calling back for lab results ?

## 2021-04-20 NOTE — Telephone Encounter (Signed)
Patient informed. Copy sent to PCP °

## 2021-04-20 NOTE — Telephone Encounter (Signed)
-----   Message from Satira Sark, MD sent at 04/18/2021  7:00 PM EDT ----- ?Results reviewed.  Follow-up carotid Dopplers show chronic occlusion of the LICA and only mild atherosclerosis of the RICA.  Suggest follow-up study in 1 year. ?

## 2021-04-20 NOTE — Telephone Encounter (Signed)
-----   Message from Satira Sark, MD sent at 04/19/2021 10:05 AM EDT ----- ?Results reviewed.  LVEF 60 to 65% with mild diastolic dysfunction.  Only mild mitral regurgitation and mild aortic regurgitation.  Keep follow-up plan. ?

## 2021-04-30 ENCOUNTER — Other Ambulatory Visit: Payer: Self-pay | Admitting: Family Medicine

## 2021-04-30 DIAGNOSIS — I10 Essential (primary) hypertension: Secondary | ICD-10-CM

## 2021-05-11 ENCOUNTER — Ambulatory Visit (INDEPENDENT_AMBULATORY_CARE_PROVIDER_SITE_OTHER): Payer: Medicare Other

## 2021-05-11 ENCOUNTER — Ambulatory Visit (INDEPENDENT_AMBULATORY_CARE_PROVIDER_SITE_OTHER): Payer: Medicare Other | Admitting: Nurse Practitioner

## 2021-05-11 ENCOUNTER — Other Ambulatory Visit (HOSPITAL_COMMUNITY): Payer: Self-pay | Admitting: Cardiology

## 2021-05-11 ENCOUNTER — Encounter: Payer: Self-pay | Admitting: Nurse Practitioner

## 2021-05-11 VITALS — BP 152/66 | HR 70 | Temp 97.8°F | Resp 20 | Ht 63.0 in | Wt 125.0 lb

## 2021-05-11 DIAGNOSIS — M25531 Pain in right wrist: Secondary | ICD-10-CM

## 2021-05-11 DIAGNOSIS — S52124A Nondisplaced fracture of head of right radius, initial encounter for closed fracture: Secondary | ICD-10-CM | POA: Diagnosis not present

## 2021-05-11 DIAGNOSIS — I701 Atherosclerosis of renal artery: Secondary | ICD-10-CM

## 2021-05-11 DIAGNOSIS — I6523 Occlusion and stenosis of bilateral carotid arteries: Secondary | ICD-10-CM

## 2021-05-11 NOTE — Progress Notes (Addendum)
? ?  Subjective:  ? ? Patient ID: Tamara King, female    DOB: 1940/04/24, 81 y.o.   MRN: [id number] ? ? ?Chief Complaint: Wrist Pain (Right wrist. [place] [address] yesterday) ? ? ?HPI ?Patient comes in stating that she fell yesterday on her front porch. She hit her riht wrist on porch railing. She says that her wrist hurt all night long. Any movement hurts her wrist. ? ? ? ?Review of Systems  ?Musculoskeletal:  Positive for arthralgias (right wrist).  ? ?   ?Objective:  ? Physical Exam ?Constitutional:   ?   Appearance: Normal appearance. She is obese.  ?Musculoskeletal:  ?   Comments: Decreased ROM of right wrist with pain on flexion and extension. Unable to supinate and pronate. Painful to make a fist.  ?Neurological:  ?   Mental Status: She is alert.  ? ?BP (!) 152/66   Pulse 70   Temp 97.8 ?F (36.6 ?C) (Temporal)   Resp 20   Ht '5\' 3"'$  (1.6 m)   Wt 125 lb (56.7 kg)   SpO2 96%   BMI 22.14 kg/m?  ? ? ?Right wrist xray- nondisplaced buckle fracture of right wrist ? ? ? ?   ?Assessment & Plan:  ? ?[name] in today with chief complaint of Wrist Pain (Right wrist. [address] yesterday) ? ? ?1. Right wrist pain ?Possible buckle fracture of radius ?Wear brace ?Radiology report pending ?Will do ortho referral if needed ?- DG Wrist Complete Right ? ?*consulted with Dr. Warrick Parisian ? ?The above assessment and management plan was discussed with the patient. The patient verbalized understanding of and has agreed to the management plan. Patient is aware to call the clinic if symptoms persist or worsen. Patient is aware when to return to the clinic for a follow-up visit. Patient educated on when it is appropriate to go to the emergency department.  ? ?Mary-Margaret Hassell Done, FNP ? ?Radilology report- distal radius fracture- referral made to ortho to be seen today or tomorrow. ?

## 2021-05-11 NOTE — Patient Instructions (Signed)
Wrist Fracture Treated With Immobilization ? ?A wrist fracture is a break or crack in one of the bones of your wrist. Your wrist is made of eight small bones at the palm of your hand (carpal bones) and two long bones of the forearm (radius and ulna). ?A broken wrist is often treated by wearing a cast, splint, or sling (immobilization). This holds the broken pieces in place so they can heal. ?What are the causes? ?A direct force to the wrist. ?Trauma, such as a car crash or falling on an outstretched hand. ?What increases the risk? ?Doing contact sports or high-risk sports such as skiing, biking, and ice skating. ?Taking steroid medicines. ?Smoking. ?Being female. ?Being Caucasian. ?Drinking more than three drinks that contain alcohol a day. ?Having a condition that weakens the bones (osteoporosis). ?Being older. ?Having had other broken bones in the past. ?What are the signs or symptoms? ?Pain. ?Swelling. ?Bruising. ?Not being able to move the wrist normally. ?The wrist hanging in an odd position or looking oddly shaped. ?How is this treated? ?Treatment involves wearing a cast, splint, or sling. You may also need to take pain medicine. Once the injury has healed enough, you may begin doing range-of-motion exercises. ?Follow these instructions at home: ?If you have a cast: ?Do not stick anything inside the cast to scratch your skin. ?Check the skin around the cast every day. Tell your doctor if you see problems. ?You may put lotion on dry skin around the cast. Do not put lotion on the skin under the cast. ?Keep the cast clean and dry. ?If you have a splint or sling: ?Wear the splint or sling as told by your doctor. Take it off only as told by your doctor. ?Loosen the splint or sling if your fingers: ?Tingle. ?Become numb. ?Turn cold and blue. ?Keep the splint or sling clean and dry. ?Bathing ?Do not take baths, swim, or use a hot tub. Ask your doctor about taking showers or sponge baths. ?If your cast, splint, or  sling is not waterproof: ?Do not let it get wet. ?Cover it with a watertight covering when you take a bath or shower. ?If you have a sling, take it off for bathing only if your doctor says this is okay. ?Managing pain, stiffness, and swelling ? ?If told, put ice on the injured area. To do this: ?If you have a removable splint or sling, take it off as told by your doctor. ?Put ice in a plastic bag. ?Place a towel between your skin and the bag or between your cast and the bag. ?Leave the ice on for 20 minutes, 2-3 times a day. ?Take off the ice if your skin turns bright red. This is very important. If you cannot feel pain, heat, or cold, you have a greater risk of damage to the area. ?Move your fingers often. ?Raise the injured area above the level of your heart while you are sitting or lying down. ?Activity ?Return to your normal activities when your doctor says that it is safe. ?Do not lift with or put weight on your injured wrist until your doctor says this is okay. ?Ask your doctor when it is safe to drive if you have a cast, splint, or sling on your wrist. ?Do range-of-motion exercises only as told by your doctor. ?Medicines ?Take over-the-counter and prescription medicines only as told by your doctor. ?Ask your doctor if you should avoid driving or using machines while you are taking your medicine. ?General instructions ?Do not put  pressure on any part of the cast or splint until it is fully hardened. ?Do not smoke or use any products that contain nicotine or tobacco. If you need help quitting, ask your doctor. ?If told, take steps to prevent problems with pooping (constipation). You may need to: ?Drink enough fluid to keep your pee (urine) pale yellow. ?Take medicines. You will be told what medicines to take. ?Eat foods that are high in fiber. These include beans, whole grains, and fresh fruits and vegetables. ?Limit foods that are high in fat and sugar. These include fried or sweet foods. ?Keep all follow-up  visits. ?Contact a doctor if: ?Your cast, splint, or sling is damaged or loose. ?You have any new pain, swelling, or bruising. ?Your pain, swelling, and bruising do not get better. ?You have a fever. ?You have chills. ?Get help right away if: ?Your skin or fingers on your injured arm turn blue or gray. ?Your arm feels cold or gets numb. ?You have very bad pain in your injured wrist. ?Summary ?A wrist fracture is a break or crack in one of the bones of your wrist. ?A broken wrist is often treated by wearing a cast, splint, or sling. ?You should check the skin around a cast every day. ?Take off a splint or sling only as told by your doctor. ?This information is not intended to replace advice given to you by your health care provider. Make sure you discuss any questions you have with your health care provider. ?Document Revised: 04/21/2019 Document Reviewed: 04/21/2019 ?Elsevier Patient Education ? [place]. ? ?

## 2021-05-11 NOTE — Addendum Note (Signed)
Addended by: Chevis Pretty on: 05/11/2021 09:59 AM ? ? Modules accepted: Orders ? ?

## 2021-05-12 ENCOUNTER — Ambulatory Visit (INDEPENDENT_AMBULATORY_CARE_PROVIDER_SITE_OTHER): Payer: Medicare Other | Admitting: Orthopedic Surgery

## 2021-05-12 ENCOUNTER — Encounter: Payer: Self-pay | Admitting: Orthopedic Surgery

## 2021-05-12 VITALS — BP 106/57 | HR 74 | Ht 63.5 in | Wt 124.0 lb

## 2021-05-12 DIAGNOSIS — S52551A Other extraarticular fracture of lower end of right radius, initial encounter for closed fracture: Secondary | ICD-10-CM

## 2021-05-12 DIAGNOSIS — I701 Atherosclerosis of renal artery: Secondary | ICD-10-CM | POA: Diagnosis not present

## 2021-05-12 NOTE — Patient Instructions (Signed)
Return in 3 weeks for follow up with an X-ray ?

## 2021-05-12 NOTE — Progress Notes (Signed)
Chief Complaint  ?Patient presents with  ? Arm Injury  ?  RT nondisplaced fracture radius ?DOI 05/10/21 pt fell  ? ? ?Tamara King began 81year-old female fell on the 19th injured her right distal radius she has a nondisplaced right distal radius fracture ? ?She is comfortable in the brace ? ?She will continue with brace treatment x-ray in 3 weeks ? ?Expect 6 weeks of treatment in cast ? ?Review of systems the patient reported review of systems as negative ? ?Past Medical History:  ?Diagnosis Date  ? Breast cancer ([place])   ? Adenocarcinoma  ? Chronic edema   ? CKD (chronic kidney disease) stage 3, GFR 30-59 ml/min ([place]) 10/22/2018  ? Coronary atherosclerosis of native coronary artery   ? Nonobstructive at catheterization 2003, LVEF normal   ? Essential hypertension   ? GERD (gastroesophageal reflux disease) 07/06/2013  ? Idiopathic peripheral neuropathy 10/06/2013  ? Mixed hyperlipidemia   ? Occlusion and stenosis of left carotid artery 10/22/2017  ? Renal artery stenosis ([place])   ? BMS bilateally 2003, PTCA bilaterally 2005 with restenosis  ? Sleep apnea   ? Stop Bang score of 4  ? Stroke [facility])   ? Symptomatic varicose veins, right 10/16/2018  ? ? ?BP (!) 106/57   Pulse 74   Ht 5' 3.5" (1.613 m)   Wt 124 lb (56.2 kg)   BMI 21.62 kg/m?  ? ?Examination of the right wrist shows no bruising or swelling she is tender over the distal radius the elbow is nontender she has good range of motion her fingers the color and capillary refill look normal she has a good radial pulse distally and no sensory deficits ? ?The film is an outside film ? ?My interpretation of the film is as follows ? ?Nondisplaced fracture distal radius no angulation or displacement ? ?Plan wrist brace ? ?Follow-up in 3 weeks for x-ray out of the brace ? ? ?

## 2021-06-01 ENCOUNTER — Ambulatory Visit (INDEPENDENT_AMBULATORY_CARE_PROVIDER_SITE_OTHER): Payer: Medicare Other | Admitting: Orthopedic Surgery

## 2021-06-01 ENCOUNTER — Ambulatory Visit (INDEPENDENT_AMBULATORY_CARE_PROVIDER_SITE_OTHER): Payer: Medicare Other

## 2021-06-01 ENCOUNTER — Encounter: Payer: Self-pay | Admitting: Orthopedic Surgery

## 2021-06-01 DIAGNOSIS — S52551A Other extraarticular fracture of lower end of right radius, initial encounter for closed fracture: Secondary | ICD-10-CM

## 2021-06-01 DIAGNOSIS — I701 Atherosclerosis of renal artery: Secondary | ICD-10-CM | POA: Diagnosis not present

## 2021-06-01 DIAGNOSIS — M25511 Pain in right shoulder: Secondary | ICD-10-CM

## 2021-06-01 NOTE — Progress Notes (Signed)
Chief Complaint  ?Patient presents with  ? Wrist Pain  ?  RT nondisplaced fracture radius ?DOI 05/10/21 pt fell ?Con't splint, doing ok, taking tylenol for pain, helps some. Thinks her right shld is hurting now from the fall as well.   ? ?Right wrist fracture seems to be healing well patient in the splint is holding the fracture well ? ?Patient also complained of right shoulder pain history of previous right rotator cuff increased pain since she fell ? ?Patient says she cannot have surgery on the right shoulder per the doctors to last saw her for that ? ?Painful right shoulder exam painful range of motion decreased abduction and flexion with weakness in each plane ? ?X-rays right shoulder ?Rotator cuff arthropathy with glenohumeral arthritis humeral head articulates with the subacromial space and acromion 1 suture anchor visible.  Chronic rotator cuff induced arthritis ? ?X-ray right wrist 3 to 4 weeks ?

## 2021-06-13 ENCOUNTER — Other Ambulatory Visit: Payer: Self-pay | Admitting: *Deleted

## 2021-06-13 MED ORDER — ALENDRONATE SODIUM 70 MG PO TABS: 70.0000 mg | ORAL_TABLET | ORAL | 0 refills | Status: DC

## 2021-06-15 ENCOUNTER — Encounter: Payer: Self-pay | Admitting: Family Medicine

## 2021-06-15 ENCOUNTER — Ambulatory Visit (INDEPENDENT_AMBULATORY_CARE_PROVIDER_SITE_OTHER): Payer: Medicare Other | Admitting: Family Medicine

## 2021-06-15 VITALS — BP 145/68 | HR 63 | Temp 97.7°F | Ht 63.5 in | Wt 125.4 lb

## 2021-06-15 DIAGNOSIS — Z0000 Encounter for general adult medical examination without abnormal findings: Secondary | ICD-10-CM

## 2021-06-15 DIAGNOSIS — I6522 Occlusion and stenosis of left carotid artery: Secondary | ICD-10-CM

## 2021-06-15 DIAGNOSIS — M810 Age-related osteoporosis without current pathological fracture: Secondary | ICD-10-CM

## 2021-06-15 DIAGNOSIS — K219 Gastro-esophageal reflux disease without esophagitis: Secondary | ICD-10-CM

## 2021-06-15 DIAGNOSIS — E782 Mixed hyperlipidemia: Secondary | ICD-10-CM

## 2021-06-15 DIAGNOSIS — R600 Localized edema: Secondary | ICD-10-CM | POA: Diagnosis not present

## 2021-06-15 DIAGNOSIS — M150 Primary generalized (osteo)arthritis: Secondary | ICD-10-CM

## 2021-06-15 DIAGNOSIS — N1832 Chronic kidney disease, stage 3b: Secondary | ICD-10-CM | POA: Diagnosis not present

## 2021-06-15 DIAGNOSIS — M159 Polyosteoarthritis, unspecified: Secondary | ICD-10-CM

## 2021-06-15 DIAGNOSIS — I10 Essential (primary) hypertension: Secondary | ICD-10-CM

## 2021-06-15 DIAGNOSIS — E559 Vitamin D deficiency, unspecified: Secondary | ICD-10-CM

## 2021-06-15 DIAGNOSIS — G479 Sleep disorder, unspecified: Secondary | ICD-10-CM

## 2021-06-15 DIAGNOSIS — Z8673 Personal history of transient ischemic attack (TIA), and cerebral infarction without residual deficits: Secondary | ICD-10-CM

## 2021-06-15 DIAGNOSIS — S52501D Unspecified fracture of the lower end of right radius, subsequent encounter for closed fracture with routine healing: Secondary | ICD-10-CM

## 2021-06-15 DIAGNOSIS — R4586 Emotional lability: Secondary | ICD-10-CM

## 2021-06-15 NOTE — Progress Notes (Signed)
Assessment & Plan:  1. Essential (primary) hypertension Well controlled on current regimen.  - CBC with Differential/Platelet - CMP14+EGFR - Lipid panel  2. Lower extremity edema Well controlled on current regimen.  - CMP14+EGFR  3. Stage 3b chronic kidney disease (HCC) Recommended scheduling appointment with our clinical pharmacist for prescription assistance for Farxiga, but patient was not agreeable to this as she reports she has too much going on right now.  I will will send her nephrologist a message and at least make them aware that we can help with this should she change her mind. - CMP14+EGFR  4. Mixed hyperlipidemia Well controlled on current regimen.  - CBC with Differential/Platelet - CMP14+EGFR - Lipid panel  5. Occlusion and stenosis of left carotid artery Managed by cardiology. - CBC with Differential/Platelet - CMP14+EGFR - Lipid panel  6. History of CVA (cerebrovascular accident) Continue rosuvastatin and Plavix. - CBC with Differential/Platelet - CMP14+EGFR - Lipid panel  7. Gastroesophageal reflux disease, unspecified whether esophagitis present Well controlled on current regimen.  - CMP14+EGFR  8. Primary osteoarthritis involving multiple joints Not well controlled, but she is not agreeable and taking tramadol. - CMP14+EGFR  9. Closed fracture of distal end of right radius with routine healing, unspecified fracture morphology, subsequent encounter Pain not well controlled, but she is not agreeable and taking tramadol. - CMP14+EGFR  10. Age-related osteoporosis without current pathological fracture Continue Fosamax and her vitamin D supplement.  11. Vitamin D deficiency Continue vitamin D supplement.  12. Difficulty sleeping Patient declines a medication to help.  13. Mood disturbance Patient declines a medication to help.  14. Healthcare maintenance Patient declines her second Shingrix today, but states she will do it next  time.   Return in about 6 months (around 12/16/2021) for annual physical.  Tamara Boston, MSN, APRN, FNP-C [facility]  Subjective:    Patient ID: Tamara King, female    DOB: 1940/11/01, 81 y.o.   MRN: [id number]  Patient Care Team: Tamara Fudge, FNP as PCP - General (Family Medicine) Tamara Sidle, MD as PCP - Cardiology (Cardiology) Tamara Lynn, MD as Consulting Physician (Nephrology) Tamara Ouch, MD as Consulting Physician (Cardiology)   Chief Complaint:  Chief Complaint  Patient presents with   Medical Management of Chronic Issues    HPI: Tamara King is a 81 y.o. female presenting on 06/15/2021 for Medical Management of Chronic Issues  Renal artery stenosis: Managed by Dr. Kirke Corin.  Patient had a balloon angioplasty of the left renal artery in-stent restenosis.  Velocities on Doppler showed patent bilateral renal artery stent with no significant restenosis. She was last seen on 09/13/2020. Her ASA was discontinued and she was continued on Plavix.   Carotid artery disease: managed by Dr. Diona Browner, cardiologist. Carotid dopplers repeated in March which are stable. LICA occluded as before and only mild RICA stenosis. The plan is to repeat her carotid dopplers in March of next year. She had a follow-up appointment with Dr. Diona Browner on 03/21/2021 at which time an echocardiogram was ordered which showed LVEF 60 to 65% with mild diastolic dysfunction.  She had only mild mitral regurgitation and mild aortic regurgitation..  Hypertension: managed by cardiology and nephrology. Amlodipine dose was decreased by nephrology in June 2022 due to her edema.   Hyperlipidemia: Patient is taking rosuvastatin nightly. It was recommended by Dr. Kirke Corin that a more potent statin be considered, which is why she was switched from pravastatin to rosuvastatin.   Lower  extremity edema: taking Lasix 40 mg twice daily. She does not elevate her feet because she is  constantly going and taking care of others. She does not wear compression hose because she reports they hurt. She is not taking a potassium supplement.   History of CVA: Patient has continued with clopidogrel.  GERD: Patient is taking both Protonix and famotidine.  Vitamin D deficiency: Patient was diagnosed with vitamin D deficiency by her nephrologist.  She is on a daily supplement.   Osteoporosis: Patient is taking Fosamax and Vitamin D.   Osteoarthritis: Patient only takes Tylenol for pain. Unable to take NSAIDs.  She does have a prescription for Tramadol, but does not take it.  Her right shoulder and wrist are the most painful for her.  She fractured her right distal radius in April.  CKD: Managed by Dr. Theador Hawthorne, nephrologist, who she saw on 04/20/2021 at which time he started Tamara.  Patient reports she was unable to pick this up as it was going to cost her around $900.  New complaints: She is not agreeable to PHQ and GAD assessments today.  She is not sleeping well.  She lost her son in September 2022.  She is currently taking care of her granddaughters, who are teenagers and expect a lot from her.   Social history:  Relevant past medical, surgical, family and social history reviewed and updated as indicated. Interim medical history since our last visit reviewed.  Allergies and medications reviewed and updated.  DATA REVIEWED: CHART IN EPIC  ROS: Negative unless specifically indicated above in HPI.    Current Outpatient Medications:    acetaminophen (TYLENOL) 500 MG tablet, Take 500 mg by mouth every 6 (six) hours as needed for moderate pain., Disp: , Rfl:    albuterol (VENTOLIN HFA) 108 (90 Base) MCG/ACT inhaler, INHALE 2 PUFFS BY MOUTH EVERY 6 HOURS AS NEEDED FOR WHEEZING AND FOR SHORTNESS OF BREATH, Disp: 18 g, Rfl: 2   alendronate (FOSAMAX) 70 MG tablet, Take 1 tablet (70 mg total) by mouth every 7 (seven) days. Take with a full glass of water on an empty stomach., Disp: 4  tablet, Rfl: 0   amLODipine (NORVASC) 5 MG tablet, Take 1 tablet (5 mg total) by mouth daily., Disp: 90 tablet, Rfl: 1   carvedilol (COREG) 25 MG tablet, Take 1 tablet by mouth twice daily, Disp: 180 tablet, Rfl: 0   cetirizine (ZYRTEC) 10 MG tablet, Take 1 tablet by mouth once daily, Disp: 90 tablet, Rfl: 1   cholecalciferol (VITAMIN D3) 25 MCG (1000 UNIT) tablet, Take 1,000 Units by mouth daily., Disp: , Rfl:    clopidogrel (PLAVIX) 75 MG tablet, Take 1 tablet (75 mg total) by mouth daily., Disp: 90 tablet, Rfl: 1   famotidine (PEPCID) 20 MG tablet, Take 1 tablet by mouth once daily, Disp: 90 tablet, Rfl: 1   fluticasone (FLONASE) 50 MCG/ACT nasal spray, Place 2 sprays into the nose daily as needed for allergies (congestion.). , Disp: , Rfl:    furosemide (LASIX) 40 MG tablet, Take 1 tablet (40 mg total) by mouth 2 (two) times daily., Disp: 180 tablet, Rfl: 1   hydrALAZINE (APRESOLINE) 10 MG tablet, Take 2 tablets (20 mg total) by mouth 3 (three) times daily., Disp: 540 tablet, Rfl: 1   ondansetron (ZOFRAN) 4 MG tablet, TAKE 1 TABLET BY MOUTH EVERY 8 HOURS AS NEEDED FOR NAUSEA OR VOMITING, Disp: 20 tablet, Rfl: 0   pantoprazole (PROTONIX) 40 MG tablet, 1 tablet daily, Disp: 90  tablet, Rfl: 1   rosuvastatin (CRESTOR) 40 MG tablet, Take 1 tablet (40 mg total) by mouth daily., Disp: 90 tablet, Rfl: 1   Tetrahydrozoline HCl (VISINE OP), Place 2 drops into both eyes daily as needed (dry eyes)., Disp: , Rfl:    traMADol (ULTRAM) 50 MG tablet, Take 1 tablet (50 mg total) by mouth every 12 (twelve) hours as needed., Disp: 10 tablet, Rfl: 0   Allergies  Allergen Reactions   Losartan Anaphylaxis   Sulfa Antibiotics Anaphylaxis and Swelling   Codeine Nausea And Vomiting   Fish Allergy Other (See Comments)    "sick as a dog"   Other Other (See Comments)    Bologna - vomiting   Spironolactone Other (See Comments)    Acute kidney injury   Vancomycin Other (See Comments)    "drives me crazy"    Penicillins Rash    Has patient had a PCN reaction causing immediate rash, facial/tongue/throat swelling, SOB or lightheadedness with hypotension: Unknown Has patient had a PCN reaction causing severe rash involving mucus membranes or skin necrosis: Unknown Has patient had a PCN reaction that required hospitalization: No Has patient had a PCN reaction occurring within the last 10 years: No Childhood reaction. If all of the above answers are "NO", then may proceed with Cephalosporin use.    Past Medical History:  Diagnosis Date   Breast cancer ([place])    Adenocarcinoma   Chronic edema    CKD (chronic kidney disease) stage 3, GFR 30-59 ml/min (HCC) 10/22/2018   Coronary atherosclerosis of native coronary artery    Nonobstructive at catheterization 2003, LVEF normal    Essential hypertension    GERD (gastroesophageal reflux disease) 07/06/2013   Idiopathic peripheral neuropathy 10/06/2013   Mixed hyperlipidemia    Occlusion and stenosis of left carotid artery 10/22/2017   Renal artery stenosis (HCC)    BMS bilateally 2003, PTCA bilaterally 2005 with restenosis   Sleep apnea    Stop Bang score of 4   Stroke [facility])    Symptomatic varicose veins, right 10/16/2018    Past Surgical History:  Procedure Laterality Date   APPENDECTOMY     CATARACT EXTRACTION W/PHACO  03/29/2011   Procedure: CATARACT EXTRACTION PHACO AND INTRAOCULAR LENS PLACEMENT ([place]);  Surgeon: Tonny Branch, MD;  Location: [facility];  Service: Ophthalmology;  Laterality: Right;  CDE:12.82   CATARACT EXTRACTION W/PHACO  04/16/2011   Procedure: CATARACT EXTRACTION PHACO AND INTRAOCULAR LENS PLACEMENT ([facility]);  Surgeon: Tonny Branch, MD;  Location: [facility];  Service: Ophthalmology;  Laterality: Left;  CDE: 11.54   CESAREAN SECTION     x2   CHOLECYSTECTOMY  2018   HEMORRHOID SURGERY     RENAL ANGIOGRAPHY N/A 05/20/2019   Procedure: RENAL ANGIOGRAPHY;  Surgeon: Wellington Hampshire, MD;  Location: [place] [facility];  Service: Cardiovascular;   Laterality: N/A;   RENAL INTERVENTION  05/20/2019   Procedure: RENAL INTERVENTION;  Surgeon: Wellington Hampshire, MD;  Location: [place] [facility];  Service: Cardiovascular;;   Right breast lumpectomy     ROTATOR CUFF REPAIR  2008   UPPER EXTREMITY ANGIOGRAPHY N/A 11/27/2017   Procedure: UPPER EXTREMITY ANGIOGRAPHY;  Surgeon: Marty Heck, MD;  Location: [place] [facility];  Service: Cardiovascular;  Laterality: N/A;    Social History   Socioeconomic History   Marital status: Widowed    Spouse name: Not on file   Number of children: 2   Years of education: 12th   Highest education level: 12th grade  Occupational History    Employer: RETIRED    Comment: n/a  Tobacco Use   Smoking status: Former    Packs/day: 1.00    Years: 35.00    Pack years: 35.00    Types: Cigarettes    Quit date: 01/22/1998    Years since quitting: 23.4   Smokeless tobacco: Never   Tobacco comments:    tobacco use - no  Vaping Use   Vaping Use: Never used  Substance and Sexual Activity   Alcohol use: No   Drug use: No   Sexual activity: Not on file  Other Topics Concern   Not on file  Social History Narrative   Patient lives at home with family.  Two sons and two granddaughters live with her.   Caffeine use; 2-5 cups daily   Oldest son has cancer   Social Determinants of Health   Financial Resource Strain: Low Risk    Difficulty of Paying Living Expenses: Not hard at all  Food Insecurity: No Food Insecurity   Worried About Charity fundraiser in the Last Year: Never true   [place] in the Last Year: Never true  Transportation Needs: No Transportation Needs   Lack of Transportation (Medical): No   Lack of Transportation (Non-Medical): No  Physical Activity: Inactive   Days of Exercise per Week: 0 days   Minutes of Exercise per Session: 0 min  Stress: Stress Concern Present   Feeling of Stress : Rather much  Social Connections: Socially Isolated   Frequency of Communication with  Friends and Family: More than three times a week   Frequency of Social Gatherings with Friends and Family: Once a week   Attends Religious Services: Never   Marine scientist or Organizations: No   Attends Archivist Meetings: Never   Marital Status: Widowed  Human resources officer Violence: Not At Risk   Fear of Current or Ex-Partner: No   Emotionally Abused: No   Physically Abused: No   Sexually Abused: No        Objective:    BP (!) 145/68   Pulse 63   Temp 97.7 F (36.5 C) (Temporal)   Ht 5' 3.5" (1.613 m)   Wt 125 lb 6.4 oz (56.9 kg)   SpO2 93%   BMI 21.87 kg/m   Wt Readings from Last 3 Encounters:  06/15/21 125 lb 6.4 oz (56.9 kg)  05/12/21 124 lb (56.2 kg)  05/11/21 125 lb (56.7 kg)   Physical Exam Vitals reviewed.  Constitutional:      General: She is not in acute distress.    Appearance: Normal appearance. She is normal weight. She is not ill-appearing, toxic-appearing or diaphoretic.  HENT:     Head: Normocephalic and atraumatic.  Eyes:     General: No scleral icterus.       Right eye: No discharge.        Left eye: No discharge.     Conjunctiva/sclera: Conjunctivae normal.  Cardiovascular:     Rate and Rhythm: Normal rate and regular rhythm.     Heart sounds: Normal heart sounds. No murmur heard.   No friction rub. No gallop.  Pulmonary:     Effort: Pulmonary effort is normal. No respiratory distress.     Breath sounds: Normal breath sounds. No stridor. No wheezing, rhonchi or rales.  Musculoskeletal:        General: Normal range of motion.     Cervical back: Normal range of motion.  Right lower leg: Edema present.     Left lower leg: Edema present.  Skin:    General: Skin is warm and dry.     Capillary Refill: Capillary refill takes less than 2 seconds.  Neurological:     General: No focal deficit present.     Mental Status: She is alert and oriented to person, place, and time. Mental status is at baseline.  Psychiatric:         Attention and Perception: Attention and perception normal.        Mood and Affect: Mood normal. Affect is tearful.        Speech: Speech normal.        Behavior: Behavior normal.        Thought Content: Thought content normal.        Cognition and Memory: Cognition and memory normal.        Judgment: Judgment normal.    Lab Results  Component Value Date   TSH 2.000 05/11/2020   Lab Results  Component Value Date   WBC 8.0 02/15/2021   HGB 13.4 02/15/2021   HCT 39.5 02/15/2021   MCV 88 02/15/2021   PLT 232 02/15/2021   Lab Results  Component Value Date   NA 142 02/15/2021   K 3.8 02/15/2021   CO2 25 02/15/2021   GLUCOSE 116 (H) 02/15/2021   BUN 11 02/15/2021   CREATININE 1.00 02/15/2021   BILITOT 0.4 02/15/2021   ALKPHOS 66 02/15/2021   AST 18 02/15/2021   ALT 11 02/15/2021   PROT 6.9 02/15/2021   ALBUMIN 4.4 02/15/2021   CALCIUM 9.5 02/15/2021   ANIONGAP 6 06/18/2016   EGFR 58 (L) 02/15/2021   Lab Results  Component Value Date   CHOL 183 02/15/2021   Lab Results  Component Value Date   HDL 85 02/15/2021   Lab Results  Component Value Date   LDLCALC 84 02/15/2021   Lab Results  Component Value Date   TRIG 75 02/15/2021   Lab Results  Component Value Date   CHOLHDL 2.2 02/15/2021   No results found for: HGBA1C

## 2021-06-16 LAB — CMP14+EGFR
ALT: 12 IU/L (ref 0–32)
AST: 23 IU/L (ref 0–40)
Albumin/Globulin Ratio: 2 (ref 1.2–2.2)
Albumin: 4.7 g/dL (ref 3.7–4.7)
Alkaline Phosphatase: 60 IU/L (ref 44–121)
BUN/Creatinine Ratio: 13 (ref 12–28)
BUN: 14 mg/dL (ref 8–27)
Bilirubin Total: 0.6 mg/dL (ref 0.0–1.2)
CO2: 24 mmol/L (ref 20–29)
Calcium: 9 mg/dL (ref 8.7–10.3)
Chloride: 100 mmol/L (ref 96–106)
Creatinine, Ser: 1.08 mg/dL — ABNORMAL HIGH (ref 0.57–1.00)
Globulin, Total: 2.3 g/dL (ref 1.5–4.5)
Glucose: 114 mg/dL — ABNORMAL HIGH (ref 70–99)
Potassium: 3.8 mmol/L (ref 3.5–5.2)
Sodium: 141 mmol/L (ref 134–144)
Total Protein: 7 g/dL (ref 6.0–8.5)
eGFR: 53 mL/min/{1.73_m2} — ABNORMAL LOW (ref >59)

## 2021-06-16 LAB — CBC WITH DIFFERENTIAL/PLATELET
Basophils Absolute: 0.1 10*3/uL (ref 0.0–0.2)
Basos: 1 %
EOS (ABSOLUTE): 0.2 10*3/uL (ref 0.0–0.4)
Eos: 2 %
Hematocrit: 39.3 % (ref 34.0–46.6)
Hemoglobin: 13.4 g/dL (ref 11.1–15.9)
Immature Grans (Abs): 0 10*3/uL (ref 0.0–0.1)
Immature Granulocytes: 0 %
Lymphocytes Absolute: 2.2 10*3/uL (ref 0.7–3.1)
Lymphs: 29 %
MCH: 30.4 pg (ref 26.6–33.0)
MCHC: 34.1 g/dL (ref 31.5–35.7)
MCV: 89 fL (ref 79–97)
Monocytes Absolute: 0.5 10*3/uL (ref 0.1–0.9)
Monocytes: 7 %
Neutrophils Absolute: 4.7 10*3/uL (ref 1.4–7.0)
Neutrophils: 61 %
Platelets: 207 10*3/uL (ref 150–450)
RBC: 4.41 x10E6/uL (ref 3.77–5.28)
RDW: 12.8 % (ref 11.7–15.4)
WBC: 7.6 10*3/uL (ref 3.4–10.8)

## 2021-06-16 LAB — LIPID PANEL
Chol/HDL Ratio: 2 ratio (ref 0.0–4.4)
Cholesterol, Total: 168 mg/dL (ref 100–199)
HDL: 85 mg/dL (ref >39)
LDL Chol Calc (NIH): 66 mg/dL (ref 0–99)
Triglycerides: 93 mg/dL (ref 0–149)
VLDL Cholesterol Cal: 17 mg/dL (ref 5–40)

## 2021-06-20 ENCOUNTER — Other Ambulatory Visit: Payer: Self-pay

## 2021-06-20 DIAGNOSIS — N1832 Chronic kidney disease, stage 3b: Secondary | ICD-10-CM

## 2021-06-21 ENCOUNTER — Telehealth: Payer: Self-pay

## 2021-06-21 NOTE — Chronic Care Management (AMB) (Signed)
  Chronic Care Management   Note  06/21/2021 Name: LASHANNA ANGELO MRN: [id number] DOB: [birth year]  ANAHITA CUA is a 81 y.o. year old female who is a primary care patient of Loman Brooklyn, FNP. I reached out to [name] by phone today in response to a referral sent by Ms. Barbaraann Rondo PCP.  Ms. Barga was given information about Chronic Care Management services today including:  CCM service includes personalized support from designated clinical staff supervised by her physician, including individualized plan of care and coordination with other care providers 24/7 contact phone numbers for assistance for urgent and routine care needs. Service will only be billed when office clinical staff spend 20 minutes or more in a month to coordinate care. Only one practitioner may furnish and bill the service in a calendar month. The patient may stop CCM services at any time (effective at the end of the month) by phone call to the office staff. The patient is responsible for co-pay (up to 20% after annual deductible is met) if co-pay is required by the individual health plan.   Patient agreed to services and verbal consent obtained.   Follow up plan: Face to Face appointment with care management team member scheduled for: 07/20/2021  Noreene Larsson, [address], [place], [place] [address] Direct Dial: [phone] Destina [email or web address] Website: [email or web address]

## 2021-06-23 ENCOUNTER — Other Ambulatory Visit: Payer: Medicare Other

## 2021-06-27 ENCOUNTER — Ambulatory Visit (INDEPENDENT_AMBULATORY_CARE_PROVIDER_SITE_OTHER): Payer: Medicare Other

## 2021-06-27 VITALS — Wt 121.0 lb

## 2021-06-27 DIAGNOSIS — Z0000 Encounter for general adult medical examination without abnormal findings: Secondary | ICD-10-CM

## 2021-06-27 DIAGNOSIS — S52501D Unspecified fracture of the lower end of right radius, subsequent encounter for closed fracture with routine healing: Secondary | ICD-10-CM | POA: Insufficient documentation

## 2021-06-27 NOTE — Progress Notes (Signed)
Subjective:   Tamara King is a 81 y.o. female who presents for Medicare Annual (Subsequent) preventive examination.  Virtual Visit via Telephone Note  I connected with  Tamara King on 06/27/21 at  8:15 AM EDT by telephone and verified that I am speaking with the correct person using two identifiers.  Location: Patient: Home Provider: [facility] Persons participating in the virtual visit: patient/Nurse Health Advisor   I discussed the limitations, risks, security and privacy concerns of performing an evaluation and management service by telephone and the availability of in person appointments. The patient expressed understanding and agreed to proceed.  Interactive audio and video telecommunications were attempted between this nurse and patient, however failed, due to patient having technical difficulties OR patient did not have access to video capability.  We continued and completed visit with audio only.  Some vital signs may be absent or patient reported.   Sargent Mankey E [place], LPN   Review of Systems     Cardiac Risk Factors include: advanced age (>67mn, >>62women);dyslipidemia;hypertension;sedentary lifestyle;Other (see comment), Risk factor comments: hx of CVA, stenosis of left carotid artery     Objective:    Today's Vitals   06/27/21 0818 06/27/21 0819  Weight: 121 lb (54.9 kg)   PainSc:  7    Body mass index is 21.1 kg/m.     06/27/2021    8:27 AM 06/24/2020    8:11 AM 06/24/2019    8:48 AM 05/20/2019    8:20 AM 11/28/2017   12:00 AM 11/27/2017    9:46 AM 06/17/2016   12:44 AM  Advanced Directives  Does Patient Have a Medical Advance Directive? No No No No No No No  Would patient like information on creating a medical advance directive? No - Patient declined No - Patient declined No - Patient declined No - Patient declined  No - Patient declined Yes (ED - Information included in AVS)    Current Medications (verified) Outpatient Encounter Medications as of 06/27/2021   Medication Sig   acetaminophen (TYLENOL) 500 MG tablet Take 500 mg by mouth every 6 (six) hours as needed for moderate pain.   albuterol (VENTOLIN HFA) 108 (90 Base) MCG/ACT inhaler INHALE 2 PUFFS BY MOUTH EVERY 6 HOURS AS NEEDED FOR WHEEZING AND FOR SHORTNESS OF BREATH   alendronate (FOSAMAX) 70 MG tablet Take 1 tablet (70 mg total) by mouth every 7 (seven) days. Take with a full glass of water on an empty stomach.   amLODipine (NORVASC) 5 MG tablet Take 1 tablet (5 mg total) by mouth daily.   carvedilol (COREG) 25 MG tablet Take 1 tablet by mouth twice daily   cetirizine (ZYRTEC) 10 MG tablet Take 1 tablet by mouth once daily   Cholecalciferol (VITAMIN D3) 25 MCG (1000 UT) CAPS Take 1 capsule by mouth daily.   clopidogrel (PLAVIX) 75 MG tablet Take 1 tablet (75 mg total) by mouth daily.   famotidine (PEPCID) 20 MG tablet Take 1 tablet by mouth once daily   fluticasone (FLONASE) 50 MCG/ACT nasal spray Place 2 sprays into the nose daily as needed for allergies (congestion.).    furosemide (LASIX) 40 MG tablet Take 1 tablet (40 mg total) by mouth 2 (two) times daily.   hydrALAZINE (APRESOLINE) 10 MG tablet Take 2 tablets (20 mg total) by mouth 3 (three) times daily.   ondansetron (ZOFRAN) 4 MG tablet TAKE 1 TABLET BY MOUTH EVERY 8 HOURS AS NEEDED FOR NAUSEA OR VOMITING   pantoprazole (PROTONIX) 40 MG  tablet 1 tablet daily   potassium chloride SA (KLOR-CON M) 20 MEQ tablet Take 20 mEq by mouth daily.   rosuvastatin (CRESTOR) 40 MG tablet Take 1 tablet (40 mg total) by mouth daily.   Tetrahydrozoline HCl (VISINE OP) Place 2 drops into both eyes daily as needed (dry eyes).   traMADol (ULTRAM) 50 MG tablet Take 1 tablet (50 mg total) by mouth every 12 (twelve) hours as needed.   [DISCONTINUED] cholecalciferol (VITAMIN D3) 25 MCG (1000 UNIT) tablet Take 1,000 Units by mouth daily.   No facility-administered encounter medications on file as of 06/27/2021.    Allergies (verified) Losartan, Sulfa  antibiotics, Codeine, Fish allergy, Other, Spironolactone, Vancomycin, and Penicillins   History: Past Medical History:  Diagnosis Date   Breast cancer ([place])    Adenocarcinoma   Chronic edema    CKD (chronic kidney disease) stage 3, GFR 30-59 ml/min (HCC) 10/22/2018   Coronary atherosclerosis of native coronary artery    Nonobstructive at catheterization 2003, LVEF normal    Essential hypertension    GERD (gastroesophageal reflux disease) 07/06/2013   Idiopathic peripheral neuropathy 10/06/2013   Mixed hyperlipidemia    Occlusion and stenosis of left carotid artery 10/22/2017   Renal artery stenosis (HCC)    BMS bilateally 2003, PTCA bilaterally 2005 with restenosis   Sleep apnea    Stop Bang score of 4   Stroke [facility])    Symptomatic varicose veins, right 10/16/2018   Past Surgical History:  Procedure Laterality Date   APPENDECTOMY     CATARACT EXTRACTION W/PHACO  03/29/2011   Procedure: CATARACT EXTRACTION PHACO AND INTRAOCULAR LENS PLACEMENT ([place]);  Surgeon: Tonny Branch, MD;  Location: [facility];  Service: Ophthalmology;  Laterality: Right;  CDE:12.82   CATARACT EXTRACTION W/PHACO  04/16/2011   Procedure: CATARACT EXTRACTION PHACO AND INTRAOCULAR LENS PLACEMENT ([facility]);  Surgeon: Tonny Branch, MD;  Location: [facility];  Service: Ophthalmology;  Laterality: Left;  CDE: 11.54   CESAREAN SECTION     x2   CHOLECYSTECTOMY  2018   HEMORRHOID SURGERY     RENAL ANGIOGRAPHY N/A 05/20/2019   Procedure: RENAL ANGIOGRAPHY;  Surgeon: Wellington Hampshire, MD;  Location: [place] [facility];  Service: Cardiovascular;  Laterality: N/A;   RENAL INTERVENTION  05/20/2019   Procedure: RENAL INTERVENTION;  Surgeon: Wellington Hampshire, MD;  Location: [place] [facility];  Service: Cardiovascular;;   Right breast lumpectomy     ROTATOR CUFF REPAIR  2008   UPPER EXTREMITY ANGIOGRAPHY N/A 11/27/2017   Procedure: UPPER EXTREMITY ANGIOGRAPHY;  Surgeon: Marty Heck, MD;  Location: [place] [facility];  Service:  Cardiovascular;  Laterality: N/A;   Family History  Problem Relation Age of Onset   Heart failure Mother    Stroke Mother 70   Lung cancer Father    Diabetes Father    Diabetes Sister    Brain cancer Sister    COPD Sister    Brain cancer Brother    Diabetes Brother    Diabetes Brother    Diabetes Son    Thyroid cancer Son    Cancer Son    Diabetes Son    Anesthesia problems Neg Hx    Hypotension Neg Hx    Malignant hyperthermia Neg Hx    Pseudochol deficiency Neg Hx    Social History   Socioeconomic History   Marital status: Widowed    Spouse name: Not on file   Number of children: 2   Years of education: 12th  Highest education level: 12th grade  Occupational History    Employer: RETIRED    Comment: n/a  Tobacco Use   Smoking status: Former    Packs/day: 1.00    Years: 35.00    Pack years: 35.00    Types: Cigarettes    Quit date: 01/22/1998    Years since quitting: 23.4   Smokeless tobacco: Never   Tobacco comments:    tobacco use - no  Vaping Use   Vaping Use: Never used  Substance and Sexual Activity   Alcohol use: No   Drug use: No   Sexual activity: Not on file  Other Topics Concern   Not on file  Social History Narrative   Patient lives at home with family.  Two sons and two granddaughters live with her.   Caffeine use; 2-5 cups daily   Oldest son has cancer   Social Determinants of Health   Financial Resource Strain: Low Risk    Difficulty of Paying Living Expenses: Not hard at all  Food Insecurity: No Food Insecurity   Worried About Charity fundraiser in the Last Year: Never true   [place] in the Last Year: Never true  Transportation Needs: No Transportation Needs   Lack of Transportation (Medical): No   Lack of Transportation (Non-Medical): No  Physical Activity: Insufficiently Active   Days of Exercise per Week: 7 days   Minutes of Exercise per Session: 10 min  Stress: Stress Concern Present   Feeling of Stress : To some extent   Social Connections: Socially Isolated   Frequency of Communication with Friends and Family: More than three times a week   Frequency of Social Gatherings with Friends and Family: Once a week   Attends Religious Services: Never   Marine scientist or Organizations: No   Attends Archivist Meetings: Never   Marital Status: Widowed    Tobacco Counseling Counseling given: Not Answered Tobacco comments: tobacco use - no   Clinical Intake:  Pre-visit preparation completed: Yes  Pain : 0-10 Pain Score: 7  Pain Type: Acute pain Pain Location: Wrist Pain Orientation: Right Pain Descriptors / Indicators: Aching, Sore Pain Onset: More than a month ago Pain Frequency: Intermittent     BMI - recorded: 21.1 Nutritional Status: BMI of 19-24  Normal Nutritional Risks: None Diabetes: No  How often do you need to have someone help you when you read instructions, pamphlets, or other written materials from your doctor or pharmacy?: 1 - Never  Diabetic? no  Interpreter Needed?: No  Information entered by :: Naoki Migliaccio, LPN   Activities of Daily Living    06/27/2021    8:28 AM  In your present state of health, do you have any difficulty performing the following activities:  Hearing? 1  Comment mild  Vision? 0  Difficulty concentrating or making decisions? 0  Walking or climbing stairs? 0  Dressing or bathing? 0  Doing errands, shopping? 0  Preparing Food and eating ? N  Using the Toilet? N  In the past six months, have you accidently leaked urine? Y  Comment mild  Do you have problems with loss of bowel control? N  Managing your Medications? N  Managing your Finances? N  Housekeeping or managing your Housekeeping? N    Patient Care Team: Loman Brooklyn, FNP as PCP - General (Family Medicine) Satira Sark, MD as PCP - Cardiology (Cardiology) Liana Gerold, MD as Consulting Physician (Nephrology) Kathlyn Sacramento  A, MD as Consulting Physician  (Cardiology) Lavera Guise, [facility] as [place] [facility] (Pharmacist) Wellington Hampshire, MD as Consulting Physician (Cardiology) Celestia Khat, [place] (Optometry)  Indicate any recent Medical Services you may have received from other than [facility] providers in the past year (date may be approximate).     Assessment:   This is a routine wellness examination for [place].  Hearing/Vision screen Hearing Screening - Comments:: C/o mild hearing difficulties - declines hearing aids Vision Screening - Comments:: Wears rx glasses - up to date with routine eye exams with [facility]  Dietary issues and exercise activities discussed: Current Exercise Habits: The patient does not participate in regular exercise at present, Exercise limited by: cardiac condition(s)   Goals Addressed             This Visit's Progress    Exercise 3x per week (30 min per time)   Not on track    Patient Stated       06/27/2021 AWV Goal: Keep All Scheduled Appointments  Over the next year, patient will attend all scheduled appointments with their PCP and any specialists that they see.          Depression Screen    06/27/2021    8:25 AM 06/15/2021   10:10 AM 03/29/2021    8:53 AM 11/15/2020   10:37 AM 08/11/2020   10:21 AM 06/24/2020    8:08 AM 05/11/2020    9:54 AM  PHQ 2/9 Scores  PHQ - 2 Score 2  0 0 0 2 0  PHQ- 9 Score 6   0 0 4   Exception Documentation  Patient refusal         Fall Risk    06/27/2021    8:20 AM 06/15/2021   10:10 AM 05/11/2021    8:27 AM 03/29/2021    8:53 AM 11/15/2020   10:37 AM  Fall Risk   Falls in the past year? 1 0 1 1 0  Number falls in past yr: 0 0 0 0   Injury with Fall? '1 1 1 1   '$ Risk for fall due to : History of fall(s);Orthopedic patient  History of fall(s) History of fall(s)   Follow up Education provided;Falls prevention discussed Falls prevention discussed Education provided Falls evaluation completed Falls evaluation completed    FALL RISK  PREVENTION PERTAINING TO THE HOME:  Any stairs in or around the home? No  If so, are there any without handrails? No  Home free of loose throw rugs in walkways, pet beds, electrical cords, etc? Yes  Adequate lighting in your home to reduce risk of falls? Yes   ASSISTIVE DEVICES UTILIZED TO PREVENT FALLS:  Life alert? No  Use of a cane, walker or w/c? No  Grab bars in the bathroom? No  Shower chair or bench in shower? No  Elevated toilet seat or a handicapped toilet? Yes   TIMED UP AND GO:  Was the test performed? No . Telephonic visit  Cognitive Function:        06/27/2021    8:29 AM 06/24/2020    8:13 AM 06/24/2019    8:56 AM  6CIT Screen  What Year? 0 points 0 points 0 points  What month? 0 points 0 points 0 points  What time? 0 points 0 points 0 points  Count back from 20 0 points 0 points 0 points  Months in reverse 0 points 0 points 0 points  Repeat phrase 2 points 0 points  0 points  Total Score 2 points 0 points 0 points    Immunizations Immunization History  Administered Date(s) Administered   Fluad Quad(high Dose 65+) 10/20/2019, 11/15/2020   Influenza, High Dose Seasonal PF 11/01/2014, 11/11/2015, 12/05/2015, 10/07/2018   Influenza,trivalent, recombinat, inj, PF 12/07/2013, 10/23/2016   Moderna Sars-Covid-2 Vaccination 04/20/2019, 05/18/2019, 01/13/2020   Pneumococcal Conjugate-13 01/14/2019   Pneumococcal Polysaccharide-23 10/22/2013   Tdap 07/06/2013, 02/01/2017   Zoster Recombinat (Shingrix) 02/15/2021    TDAP status: Up to date  Flu Vaccine status: Up to date  Pneumococcal vaccine status: Up to date  Covid-19 vaccine status: Completed vaccines  Qualifies for Shingles Vaccine? Yes   Zostavax completed Yes   Shingrix Completed?: No.    Education has been provided regarding the importance of this vaccine. Patient has been advised to call insurance company to determine out of pocket expense if they have not yet received this vaccine. Advised may also  receive vaccine at local pharmacy or Health Dept. Verbalized acceptance and understanding.  Screening Tests Health Maintenance  Topic Date Due   COVID-19 Vaccine (4 - Booster for Moderna series) 07/01/2021 (Originally 03/09/2020)   Zoster Vaccines- Shingrix (2 of 2) 09/15/2021 (Originally 04/12/2021)   INFLUENZA VACCINE  08/22/2021   DEXA SCAN  12/03/2021   MAMMOGRAM  03/03/2022   TETANUS/TDAP  02/02/2027   Pneumonia Vaccine 104+ Years old  Completed   Hepatitis C Screening  Completed   HPV VACCINES  Aged Out    Health Maintenance  There are no preventive care reminders to display for this patient.  Colorectal cancer screening: No longer required.   Mammogram status: Completed 03/03/2021. Repeat every year  Bone Density status: Completed 12/04/2019. Results reflect: Bone density results: OSTEOPOROSIS. Repeat every 2 years.  Lung Cancer Screening: (Low Dose CT Chest recommended if Age 70-80 years, 30 pack-year currently smoking OR have quit w/in 15years.) does not qualify.   Additional Screening:  Hepatitis C Screening: does qualify; Completed 04/08/2019  Vision Screening: Recommended annual ophthalmology exams for early detection of glaucoma and other disorders of the eye. Is the patient up to date with their annual eye exam?  Yes  Who is the provider or what is the name of the office in which the patient attends annual eye exams? [facility] If pt is not established with a provider, would they like to be referred to a provider to establish care? No .   Dental Screening: Recommended annual dental exams for proper oral hygiene  Community Resource Referral / Chronic Care Management: CRR required this visit?  No   CCM required this visit?  No      Plan:     I have personally reviewed and noted the following in the patient's chart:   Medical and social history Use of alcohol, tobacco or illicit drugs  Current medications and supplements including opioid prescriptions.   Functional ability and status Nutritional status Physical activity Advanced directives List of other physicians Hospitalizations, surgeries, and ER visits in previous 12 months Vitals Screenings to include cognitive, depression, and falls Referrals and appointments  In addition, I have reviewed and discussed with patient certain preventive protocols, quality metrics, and best practice recommendations. A written personalized care plan for preventive services as well as general preventive health recommendations were provided to patient.     Sandrea Hammond, LPN   4/0/9811   Nurse Notes: depression and anxiety screenings abnormal - patient declines referrals at this time

## 2021-06-27 NOTE — Patient Instructions (Signed)
Tamara King , Thank you for taking time to come for your Medicare Wellness Visit. I appreciate your ongoing commitment to your health goals. Please review the following plan we discussed and let me know if I can assist you in the future.   Screening recommendations/referrals: Colonoscopy: Done 04/28/2019 - no repeat required Mammogram: Done 03/03/2021 - Repeat annually Bone Density: Done 12/04/2019 - Repeat every 2 years Recommended yearly ophthalmology/optometry visit for glaucoma screening and checkup Recommended yearly dental visit for hygiene and checkup  Vaccinations: Influenza vaccine: Done 11/15/2020 - Repeat annually Pneumococcal vaccine: Done 10/22/2013 & 01/14/2019 Tdap vaccine: Done 02/01/2017 - Repeat in 10 years  Shingles vaccine: Done 02/15/2021 - get second dose at next visit   Covid-19: Done 04/20/2019, 05/18/2019, & 01/13/2020  Advanced directives: Advance directive discussed with you today. Even though you declined this today, please call our office should you change your mind, and we can give you the proper paperwork for you to fill out.   Conditions/risks identified: Aim for 30 minutes of exercise or brisk walking, 6-8 glasses of water, and 5 servings of fruits and vegetables each day.   Next appointment: Follow up in one year for your annual wellness visit    Preventive Care 65 Years and Older, Female Preventive care refers to lifestyle choices and visits with your health care provider that can promote health and wellness. What does preventive care include? A yearly physical exam. This is also called an annual well check. Dental exams once or twice a year. Routine eye exams. Ask your health care provider how often you should have your eyes checked. Personal lifestyle choices, including: Daily care of your teeth and gums. Regular physical activity. Eating a healthy diet. Avoiding tobacco and drug use. Limiting alcohol use. Practicing safe sex. Taking low-dose aspirin  every day. Taking vitamin and mineral supplements as recommended by your health care provider. What happens during an annual well check? The services and screenings done by your health care provider during your annual well check will depend on your age, overall health, lifestyle risk factors, and family history of disease. Counseling  Your health care provider may ask you questions about your: Alcohol use. Tobacco use. Drug use. Emotional well-being. Home and relationship well-being. Sexual activity. Eating habits. History of falls. Memory and ability to understand (cognition). Work and work Statistician. Reproductive health. Screening  You may have the following tests or measurements: Height, weight, and BMI. Blood pressure. Lipid and cholesterol levels. These may be checked every 5 years, or more frequently if you are over 30 years old. Skin check. Lung cancer screening. You may have this screening every year starting at age 50 if you have a 30-pack-year history of smoking and currently smoke or have quit within the past 15 years. Fecal occult blood test (FOBT) of the stool. You may have this test every year starting at age 90. Flexible sigmoidoscopy or colonoscopy. You may have a sigmoidoscopy every 5 years or a colonoscopy every 10 years starting at age 76. Hepatitis C blood test. Hepatitis B blood test. Sexually transmitted disease (STD) testing. Diabetes screening. This is done by checking your blood sugar (glucose) after you have not eaten for a while (fasting). You may have this done every 1-3 years. Bone density scan. This is done to screen for osteoporosis. You may have this done starting at age 70. Mammogram. This may be done every 1-2 years. Talk to your health care provider about how often you should have regular mammograms. Talk with  your health care provider about your test results, treatment options, and if necessary, the need for more tests. Vaccines  Your health  care provider may recommend certain vaccines, such as: Influenza vaccine. This is recommended every year. Tetanus, diphtheria, and acellular pertussis (Tdap, Td) vaccine. You may need a Td booster every 10 years. Zoster vaccine. You may need this after age 11. Pneumococcal 13-valent conjugate (PCV13) vaccine. One dose is recommended after age 36. Pneumococcal polysaccharide (PPSV23) vaccine. One dose is recommended after age 63. Talk to your health care provider about which screenings and vaccines you need and how often you need them. This information is not intended to replace advice given to you by your health care provider. Make sure you discuss any questions you have with your health care provider. Document Released: 02/04/2015 Document Revised: 09/28/2015 Document Reviewed: 11/09/2014 Elsevier Interactive Patient Education  2017 [employer] Prevention in the Home Falls can cause injuries. They can happen to people of all ages. There are many things you can do to make your home safe and to help prevent falls. What can I do on the outside of my home? Regularly fix the edges of walkways and driveways and fix any cracks. Remove anything that might make you trip as you walk through a door, such as a raised step or threshold. Trim any bushes or trees on the path to your home. Use bright outdoor lighting. Clear any walking paths of anything that might make someone trip, such as rocks or tools. Regularly check to see if handrails are loose or broken. Make sure that both sides of any steps have handrails. Any raised decks and porches should have guardrails on the edges. Have any leaves, snow, or ice cleared regularly. Use sand or salt on walking paths during winter. Clean up any spills in your garage right away. This includes oil or grease spills. What can I do in the bathroom? Use night lights. Install grab bars by the toilet and in the tub and shower. Do not use towel bars as grab  bars. Use non-skid mats or decals in the tub or shower. If you need to sit down in the shower, use a plastic, non-slip stool. Keep the floor dry. Clean up any water that spills on the floor as soon as it happens. Remove soap buildup in the tub or shower regularly. Attach bath mats securely with double-sided non-slip rug tape. Do not have throw rugs and other things on the floor that can make you trip. What can I do in the bedroom? Use night lights. Make sure that you have a light by your bed that is easy to reach. Do not use any sheets or blankets that are too big for your bed. They should not hang down onto the floor. Have a firm chair that has side arms. You can use this for support while you get dressed. Do not have throw rugs and other things on the floor that can make you trip. What can I do in the kitchen? Clean up any spills right away. Avoid walking on wet floors. Keep items that you use a lot in easy-to-reach places. If you need to reach something above you, use a strong step stool that has a grab bar. Keep electrical cords out of the way. Do not use floor polish or wax that makes floors slippery. If you must use wax, use non-skid floor wax. Do not have throw rugs and other things on the floor that can make you trip.  What can I do with my stairs? Do not leave any items on the stairs. Make sure that there are handrails on both sides of the stairs and use them. Fix handrails that are broken or loose. Make sure that handrails are as long as the stairways. Check any carpeting to make sure that it is firmly attached to the stairs. Fix any carpet that is loose or worn. Avoid having throw rugs at the top or bottom of the stairs. If you do have throw rugs, attach them to the floor with carpet tape. Make sure that you have a light switch at the top of the stairs and the bottom of the stairs. If you do not have them, ask someone to add them for you. What else can I do to help prevent  falls? Wear shoes that: Do not have high heels. Have rubber bottoms. Are comfortable and fit you well. Are closed at the toe. Do not wear sandals. If you use a stepladder: Make sure that it is fully opened. Do not climb a closed stepladder. Make sure that both sides of the stepladder are locked into place. Ask someone to hold it for you, if possible. Clearly mark and make sure that you can see: Any grab bars or handrails. First and last steps. Where the edge of each step is. Use tools that help you move around (mobility aids) if they are needed. These include: Canes. Walkers. Scooters. Crutches. Turn on the lights when you go into a dark area. Replace any light bulbs as soon as they burn out. Set up your furniture so you have a clear path. Avoid moving your furniture around. If any of your floors are uneven, fix them. If there are any pets around you, be aware of where they are. Review your medicines with your doctor. Some medicines can make you feel dizzy. This can increase your chance of falling. Ask your doctor what other things that you can do to help prevent falls. This information is not intended to replace advice given to you by your health care provider. Make sure you discuss any questions you have with your health care provider. Document Released: 11/04/2008 Document Revised: 06/16/2015 Document Reviewed: 02/12/2014 Elsevier Interactive Patient Education  2017 [employer].

## 2021-06-28 ENCOUNTER — Ambulatory Visit (INDEPENDENT_AMBULATORY_CARE_PROVIDER_SITE_OTHER): Payer: Medicare Other | Admitting: Orthopedic Surgery

## 2021-06-28 ENCOUNTER — Ambulatory Visit (INDEPENDENT_AMBULATORY_CARE_PROVIDER_SITE_OTHER): Payer: Medicare Other

## 2021-06-28 DIAGNOSIS — S52551D Other extraarticular fracture of lower end of right radius, subsequent encounter for closed fracture with routine healing: Secondary | ICD-10-CM | POA: Diagnosis not present

## 2021-06-28 NOTE — Progress Notes (Signed)
FOLLOW UP   Encounter Diagnosis  Name Primary?   Other closed extra-articular fracture of distal end of right radius with routine healing, subsequent encounter 05/10/21 Yes     Chief Complaint  Patient presents with   Wrist Injury    05/10/21 right wrist fracture    Follow-up after wrist fracture  The patient complains of ulnar-sided wrist pain when the brace is off  Wrist alignment looks normal neurovascular exam is intact  X-rays show fracture alignment is good good callus is seen in the region of the fracture   5 wk fu  X-rays then

## 2021-07-18 ENCOUNTER — Other Ambulatory Visit: Payer: Self-pay | Admitting: Family Medicine

## 2021-07-20 ENCOUNTER — Ambulatory Visit: Payer: Medicare Other

## 2021-07-29 ENCOUNTER — Other Ambulatory Visit: Payer: Self-pay | Admitting: Family Medicine

## 2021-07-29 DIAGNOSIS — I10 Essential (primary) hypertension: Secondary | ICD-10-CM

## 2021-08-02 ENCOUNTER — Ambulatory Visit (INDEPENDENT_AMBULATORY_CARE_PROVIDER_SITE_OTHER): Payer: Medicare Other

## 2021-08-02 ENCOUNTER — Ambulatory Visit (INDEPENDENT_AMBULATORY_CARE_PROVIDER_SITE_OTHER): Payer: Medicare Other | Admitting: Orthopedic Surgery

## 2021-08-02 DIAGNOSIS — S52551D Other extraarticular fracture of lower end of right radius, subsequent encounter for closed fracture with routine healing: Secondary | ICD-10-CM | POA: Diagnosis not present

## 2021-08-02 NOTE — Patient Instructions (Signed)
Brace off 

## 2021-08-02 NOTE — Progress Notes (Signed)
Chief Complaint  Patient presents with   Wrist Injury    RT wrist/ fracture care DOI 05/10/21   Tessy has had a wrist brace on for this fracture for about 3 months.  She complains of pain in her hand actually.  Limb alignment looks normal neurovascular exam is intact skin is normal.  I do not see any issues with her hand other than chronic arthritis  X-rays show fracture healing minimal if any angulation or displacement  Recommend removing the brace.  If she still having trouble after 3 to 4 weeks with the brace off I recommended that she call to get another appointment

## 2021-08-13 ENCOUNTER — Other Ambulatory Visit: Payer: Self-pay | Admitting: Family Medicine

## 2021-08-13 DIAGNOSIS — I10 Essential (primary) hypertension: Secondary | ICD-10-CM

## 2021-08-13 DIAGNOSIS — E785 Hyperlipidemia, unspecified: Secondary | ICD-10-CM

## 2021-08-13 DIAGNOSIS — Z8673 Personal history of transient ischemic attack (TIA), and cerebral infarction without residual deficits: Secondary | ICD-10-CM

## 2021-08-13 DIAGNOSIS — I6522 Occlusion and stenosis of left carotid artery: Secondary | ICD-10-CM

## 2021-08-13 DIAGNOSIS — I701 Atherosclerosis of renal artery: Secondary | ICD-10-CM

## 2021-08-30 ENCOUNTER — Encounter: Payer: Self-pay | Admitting: *Deleted

## 2021-09-19 ENCOUNTER — Ambulatory Visit: Payer: Medicare Other | Attending: Cardiovascular Disease | Admitting: Cardiovascular Disease

## 2021-09-19 VITALS — BP 122/60 | HR 58 | Ht 63.5 in | Wt 118.0 lb

## 2021-09-19 DIAGNOSIS — I701 Atherosclerosis of renal artery: Secondary | ICD-10-CM | POA: Diagnosis not present

## 2021-09-19 DIAGNOSIS — I779 Disorder of arteries and arterioles, unspecified: Secondary | ICD-10-CM | POA: Insufficient documentation

## 2021-09-19 DIAGNOSIS — E782 Mixed hyperlipidemia: Secondary | ICD-10-CM | POA: Diagnosis not present

## 2021-09-19 NOTE — Patient Instructions (Signed)
Medication Instructions:  The current medical regimen is effective;  continue present plan and medications.  *If you need a refill on your cardiac medications before your next appointment, please call your pharmacy*   Testing/Procedures: Your physician has requested that you have a renal artery duplex.[facility] office, in December) During this test, an ultrasound is used to evaluate blood flow to the kidneys. Allow one hour for this exam. Do not eat after midnight the day before and avoid carbonated beverages. Take your medications as you usually do.   Follow-Up: At [facility], you and your health needs are our priority.  As part of our continuing mission to provide you with exceptional heart care, we have created designated Provider Care Teams.  These Care Teams include your primary Cardiologist (physician) and Advanced Practice Providers (APPs -  Physician Assistants and Nurse Practitioners) who all work together to provide you with the care you need, when you need it.  We recommend signing up for the patient portal called "MyChart".  Sign up information is provided on this After Visit Summary.  MyChart is used to connect with patients for Virtual Visits (Telemedicine).  Patients are able to view lab/test results, encounter notes, upcoming appointments, etc.  Non-urgent messages can be sent to your provider as well.   To learn more about what you can do with MyChart, go to [email or web address].    Your next appointment:   12 month(s)  The format for your next appointment:   In Person  Provider:   Kathlyn Sacramento, MD

## 2021-09-19 NOTE — Progress Notes (Signed)
Cardiology Office Note   Date:  09/19/2021   ID:  Tamara King, Tamara King July 25, 1940, MRN [id number]  PCP:  Loman Brooklyn, FNP  Cardiologist: Dr. Domenic Polite  Chief Complaint  Patient presents with   Follow-up    1 year. Stays cold all the time.   Chest Pain   Headache   Shortness of Breath   Edema       History of Present Illness: Tamara King is a 81 y.o. female who is here today for follow-up visit regarding renal artery stenosis.   She has history of mild nonobstructive coronary artery disease on previous cardiac catheterization in 2003.  She has known history of renal artery stenosis.  She underwent bilateral renal artery stenting in December 2003.  She had restenosis in 2005 and had repeat angioplasty for in-stent restenosis.   She has chronic medical conditions that include difficult to control hypertension, hyperlipidemia, carotid disease with known occluded left carotid artery, mild chronic kidney disease, previous tobacco use and left bundle branch block. She has intolerance to multiple antihypertensive medications according to the notes.  She is not able to tolerate ACE inhibitors or ARB due to an allergic reaction.  Hydrochlorothiazide caused constipation.  Spironolactone caused worsening renal function.    She had worsening renal artery stenosis in 2021 and thus angiography was performed in April 2021 which showed patent bilateral renal artery stents with significant restenosis on the left side.  I performed successful balloon angioplasty of the left renal artery stent.  Most  recent renal artery duplex in December 2022 showed patent renal artery stents with no significant restenosis.  She has been doing reasonably well with no chest pain or shortness of breath.  She does complain of mild dizziness and feeling cold all the time.  Renal function has been stable.   Past Medical History:  Diagnosis Date   Breast cancer ([place])    Adenocarcinoma   Chronic edema    CKD  (chronic kidney disease) stage 3, GFR 30-59 ml/min (HCC) 10/22/2018   Coronary atherosclerosis of native coronary artery    Nonobstructive at catheterization 2003, LVEF normal    Essential hypertension    GERD (gastroesophageal reflux disease) 07/06/2013   Idiopathic peripheral neuropathy 10/06/2013   Mixed hyperlipidemia    Occlusion and stenosis of left carotid artery 10/22/2017   Renal artery stenosis (HCC)    BMS bilateally 2003, PTCA bilaterally 2005 with restenosis   Sleep apnea    Stop Bang score of 4   Stroke [facility])    Symptomatic varicose veins, right 10/16/2018    Past Surgical History:  Procedure Laterality Date   APPENDECTOMY     CATARACT EXTRACTION W/PHACO  03/29/2011   Procedure: CATARACT EXTRACTION PHACO AND INTRAOCULAR LENS PLACEMENT ([place]);  Surgeon: Tonny Branch, MD;  Location: [facility];  Service: Ophthalmology;  Laterality: Right;  CDE:12.82   CATARACT EXTRACTION W/PHACO  04/16/2011   Procedure: CATARACT EXTRACTION PHACO AND INTRAOCULAR LENS PLACEMENT ([facility]);  Surgeon: Tonny Branch, MD;  Location: [facility];  Service: Ophthalmology;  Laterality: Left;  CDE: 11.54   CESAREAN SECTION     x2   CHOLECYSTECTOMY  2018   HEMORRHOID SURGERY     RENAL ANGIOGRAPHY N/A 05/20/2019   Procedure: RENAL ANGIOGRAPHY;  Surgeon: Wellington Hampshire, MD;  Location: [place] [facility];  Service: Cardiovascular;  Laterality: N/A;   RENAL INTERVENTION  05/20/2019   Procedure: RENAL INTERVENTION;  Surgeon: Wellington Hampshire, MD;  Location: [place] [facility];  Service: Cardiovascular;;   Right breast lumpectomy     ROTATOR CUFF REPAIR  2008   UPPER EXTREMITY ANGIOGRAPHY N/A 11/27/2017   Procedure: UPPER EXTREMITY ANGIOGRAPHY;  Surgeon: Marty Heck, MD;  Location: [place] [facility];  Service: Cardiovascular;  Laterality: N/A;     Current Outpatient Medications  Medication Sig Dispense Refill   acetaminophen (TYLENOL) 500 MG tablet Take 500 mg by mouth every 6 (six) hours as needed for moderate  pain.     albuterol (VENTOLIN HFA) 108 (90 Base) MCG/ACT inhaler INHALE 2 PUFFS BY MOUTH EVERY 6 HOURS AS NEEDED FOR WHEEZING AND FOR SHORTNESS OF BREATH 18 g 2   alendronate (FOSAMAX) 70 MG tablet TAKE 1 TABLET BY MOUTH ONCE A WEEK TAKE  WITH  A  FULL  GLASS  OF  WATER  ON  AN  EMPTY  STOMACH 4 tablet 2   amLODipine (NORVASC) 5 MG tablet Take 1 tablet by mouth once daily 90 tablet 1   carvedilol (COREG) 25 MG tablet Take 1 tablet by mouth twice daily 180 tablet 0   cetirizine (ZYRTEC) 10 MG tablet Take 1 tablet by mouth once daily 90 tablet 1   Cholecalciferol (VITAMIN D3) 25 MCG (1000 UT) CAPS Take 1 capsule by mouth daily.     clopidogrel (PLAVIX) 75 MG tablet Take 1 tablet (75 mg total) by mouth daily. 90 tablet 1   famotidine (PEPCID) 20 MG tablet Take 1 tablet by mouth once daily 90 tablet 1   fluticasone (FLONASE) 50 MCG/ACT nasal spray Place 2 sprays into the nose daily as needed for allergies (congestion.).      furosemide (LASIX) 40 MG tablet Take 1 tablet (40 mg total) by mouth 2 (two) times daily. 180 tablet 1   hydrALAZINE (APRESOLINE) 10 MG tablet Take 2 tablets (20 mg total) by mouth 3 (three) times daily. 540 tablet 1   ondansetron (ZOFRAN) 4 MG tablet TAKE 1 TABLET BY MOUTH EVERY 8 HOURS AS NEEDED FOR NAUSEA OR VOMITING 20 tablet 0   pantoprazole (PROTONIX) 40 MG tablet 1 tablet daily 90 tablet 1   potassium chloride SA (KLOR-CON M) 20 MEQ tablet Take 20 mEq by mouth daily.     rosuvastatin (CRESTOR) 40 MG tablet Take 1 tablet by mouth once daily 90 tablet 1   Tetrahydrozoline HCl (VISINE OP) Place 2 drops into both eyes daily as needed (dry eyes).     traMADol (ULTRAM) 50 MG tablet Take 1 tablet (50 mg total) by mouth every 12 (twelve) hours as needed. 10 tablet 0   No current facility-administered medications for this visit.    Allergies:   Losartan, Sulfa antibiotics, Codeine, Fish allergy, Other, Spironolactone, Vancomycin, and Penicillins    Social History:  The patient   reports that she quit smoking about 23 years ago. Her smoking use included cigarettes. She has a 35.00 pack-year smoking history. She has never used smokeless tobacco. She reports that she does not drink alcohol and does not use drugs.   Family History:  The patient's family history includes Brain cancer in her brother and sister; COPD in her sister; Cancer in her son; Diabetes in her brother, brother, father, sister, son, and son; Heart failure in her mother; Lung cancer in her father; Stroke (age of onset: 28) in her mother; Thyroid cancer in her son.    ROS:  Please see the history of present illness.   Otherwise, review of systems are positive for none.   All other systems  are reviewed and negative.    PHYSICAL EXAM: VS:  BP 122/60 (BP Location: Left Arm, Patient Position: Sitting, Cuff Size: Normal)   Pulse (!) 58   Ht 5' 3.5" (1.613 m)   Wt 118 lb (53.5 kg)   BMI 20.57 kg/m  , BMI Body mass index is 20.57 kg/m. GEN: Well nourished, well developed, in no acute distress  HEENT: normal  Neck: no JVD,  or masses.  Bilateral carotid bruits Cardiac: RRR; no  rubs, or gallops.  2/ 6 systolic murmur in the aortic area which is early peaking.  There is mild bilateral leg edema with significant tenderness. Respiratory:  clear to auscultation bilaterally, normal work of breathing GI: soft, nontender, nondistended, + BS MS: no deformity or atrophy  Skin: warm and dry, no rash Neuro:  Strength and sensation are intact Psych: euthymic mood, full affect    EKG:  EKG is ordered today. EKG showed normal sinus rhythm with first-degree AV block and left bundle branch block which is old.  Heart rate is 58 bpm.   Recent Labs: 06/15/2021: ALT 12; BUN 14; Creatinine, Ser 1.08; Hemoglobin 13.4; Platelets 207; Potassium 3.8; Sodium 141    Lipid Panel    Component Value Date/Time   CHOL 168 06/15/2021 1040   TRIG 93 06/15/2021 1040   HDL 85 06/15/2021 1040   CHOLHDL 2.0 06/15/2021 1040    LDLCALC 66 06/15/2021 1040      Wt Readings from Last 3 Encounters:  09/19/21 118 lb (53.5 kg)  06/27/21 121 lb (54.9 kg)  06/15/21 125 lb 6.4 oz (56.9 kg)            No data to display            ASSESSMENT AND PLAN:  1.  Renal artery stenosis: Status post balloon angioplasty of the left renal artery in-stent restenosis.  Most recent Doppler in December showed patent bilateral renal artery stents with no significant restenosis.  I requested a follow-up renal artery duplex to be done in December.  Continue long-term treatment with clopidogrel given her extensive vascular disease.   2.  Carotid artery disease: Most recent carotid Doppler in March showed chronically occluded left ICA with mild disease on the right side.  She repeat study in March of next year.  3.  Hyperlipidemia: She was switched from pravastatin to rosuvastatin 40 mg once daily.  4.  Hypertension: Blood pressure is reasonably controlled on current medications.  5.  Chronic kidney disease: She is followed by nephrology with stable renal function.     Disposition:   FU with me in 12 months  Signed,  Kathlyn Sacramento, MD  09/19/2021 10:23 AM    [place]

## 2021-09-25 NOTE — Progress Notes (Signed)
Cardiology Office Note  Date: 09/26/2021   ID: Tamara King, Tamara King 1940-06-01, MRN [id number]  PCP:  Loman Brooklyn, FNP  Cardiologist:  Rozann Lesches, MD Electrophysiologist:  None   Chief Complaint  Patient presents with   Cardiac follow-up    History of Present Illness: Tamara King is a 81 y.o. female last seen in February.  She is here for a follow-up visit.  Reports no progressive angina symptoms, no sense of palpitations.  She does have trouble with her balance and has had some falls, no syncope however.  Follow-up echocardiogram in March revealed LVEF 60 to 65% with mild diastolic dysfunction, normal estimated PASP, mild mitral regurgitation, and mild aortic regurgitation.  She had an evaluation by Dr. Fletcher Anon recently in late August, I reviewed the note.  She is being followed for both carotid artery and renal artery disease.  Follow-up imaging scheduled for later in the year.  I reviewed her medications which are stable and outlined below.  I personally reviewed her ECG today which shows sinus rhythm with prolonged PR interval and left bundle branch block.  Past Medical History:  Diagnosis Date   Breast cancer ([place])    Adenocarcinoma   Chronic edema    CKD (chronic kidney disease) stage 3, GFR 30-59 ml/min (HCC) 10/22/2018   Coronary atherosclerosis of native coronary artery    Nonobstructive at catheterization 2003, LVEF normal    Essential hypertension    GERD (gastroesophageal reflux disease) 07/06/2013   Idiopathic peripheral neuropathy 10/06/2013   Mixed hyperlipidemia    Occlusion and stenosis of left carotid artery 10/22/2017   Renal artery stenosis (HCC)    BMS bilateally 2003, PTCA bilaterally 2005 with restenosis   Sleep apnea    Stop Bang score of 4   Stroke [facility])    Symptomatic varicose veins, right 10/16/2018    Past Surgical History:  Procedure Laterality Date   APPENDECTOMY     CATARACT EXTRACTION W/PHACO  03/29/2011   Procedure: CATARACT  EXTRACTION PHACO AND INTRAOCULAR LENS PLACEMENT ([place]);  Surgeon: Tonny Branch, MD;  Location: [facility];  Service: Ophthalmology;  Laterality: Right;  CDE:12.82   CATARACT EXTRACTION W/PHACO  04/16/2011   Procedure: CATARACT EXTRACTION PHACO AND INTRAOCULAR LENS PLACEMENT ([facility]);  Surgeon: Tonny Branch, MD;  Location: [facility];  Service: Ophthalmology;  Laterality: Left;  CDE: 11.54   CESAREAN SECTION     x2   CHOLECYSTECTOMY  2018   HEMORRHOID SURGERY     RENAL ANGIOGRAPHY N/A 05/20/2019   Procedure: RENAL ANGIOGRAPHY;  Surgeon: Wellington Hampshire, MD;  Location: [place] [facility];  Service: Cardiovascular;  Laterality: N/A;   RENAL INTERVENTION  05/20/2019   Procedure: RENAL INTERVENTION;  Surgeon: Wellington Hampshire, MD;  Location: [place] [facility];  Service: Cardiovascular;;   Right breast lumpectomy     ROTATOR CUFF REPAIR  2008   UPPER EXTREMITY ANGIOGRAPHY N/A 11/27/2017   Procedure: UPPER EXTREMITY ANGIOGRAPHY;  Surgeon: Marty Heck, MD;  Location: [place] [facility];  Service: Cardiovascular;  Laterality: N/A;    Current Outpatient Medications  Medication Sig Dispense Refill   acetaminophen (TYLENOL) 500 MG tablet Take 500 mg by mouth every 6 (six) hours as needed for moderate pain.     albuterol (VENTOLIN HFA) 108 (90 Base) MCG/ACT inhaler INHALE 2 PUFFS BY MOUTH EVERY 6 HOURS AS NEEDED FOR WHEEZING AND FOR SHORTNESS OF BREATH 18 g 2   alendronate (FOSAMAX) 70 MG tablet TAKE 1 TABLET BY  MOUTH ONCE A WEEK TAKE  WITH  A  FULL  GLASS  OF  WATER  ON  AN  EMPTY  STOMACH 4 tablet 2   amLODipine (NORVASC) 5 MG tablet Take 1 tablet by mouth once daily 90 tablet 1   carvedilol (COREG) 25 MG tablet Take 1 tablet by mouth twice daily 180 tablet 0   cetirizine (ZYRTEC) 10 MG tablet Take 1 tablet by mouth once daily 90 tablet 1   Cholecalciferol (VITAMIN D3) 25 MCG (1000 UT) CAPS Take 1 capsule by mouth daily.     clopidogrel (PLAVIX) 75 MG tablet Take 1 tablet (75 mg total) by mouth daily. 90  tablet 1   famotidine (PEPCID) 20 MG tablet Take 1 tablet by mouth once daily 90 tablet 1   fluticasone (FLONASE) 50 MCG/ACT nasal spray Place 2 sprays into the nose daily as needed for allergies (congestion.).      furosemide (LASIX) 40 MG tablet Take 1 tablet (40 mg total) by mouth 2 (two) times daily. 180 tablet 1   hydrALAZINE (APRESOLINE) 10 MG tablet Take 2 tablets (20 mg total) by mouth 3 (three) times daily. 540 tablet 1   ondansetron (ZOFRAN) 4 MG tablet TAKE 1 TABLET BY MOUTH EVERY 8 HOURS AS NEEDED FOR NAUSEA OR VOMITING 20 tablet 0   pantoprazole (PROTONIX) 40 MG tablet 1 tablet daily 90 tablet 1   potassium chloride SA (KLOR-CON M) 20 MEQ tablet Take 20 mEq by mouth daily.     rosuvastatin (CRESTOR) 40 MG tablet Take 1 tablet by mouth once daily 90 tablet 1   Tetrahydrozoline HCl (VISINE OP) Place 2 drops into both eyes daily as needed (dry eyes).     traMADol (ULTRAM) 50 MG tablet Take 1 tablet (50 mg total) by mouth every 12 (twelve) hours as needed. 10 tablet 0   No current facility-administered medications for this visit.   Allergies:  Losartan, Sulfa antibiotics, Codeine, Fish allergy, Other, Spironolactone, Vancomycin, and Penicillins   ROS:  No syncope.   Physical Exam: VS:  BP (!) 142/76   Pulse 63   Ht '5\' 3"'$  (1.6 m)   Wt 119 lb (54 kg)   SpO2 96%   BMI 21.08 kg/m , BMI Body mass index is 21.08 kg/m.  Wt Readings from Last 3 Encounters:  09/26/21 119 lb (54 kg)  09/19/21 118 lb (53.5 kg)  06/27/21 121 lb (54.9 kg)    General: Patient appears comfortable at rest. HEENT: Conjunctiva and lids normal. Neck: Supple, no elevated JVP or carotid bruits, no thyromegaly. Lungs: Clear to auscultation, nonlabored breathing at rest. Cardiac: Regular rate and rhythm, no S3, 2/6 systolic murmur. Extremities: No pitting edema.  ECG:  An ECG dated 09/19/2021 was personally reviewed today and demonstrated:  Sinus rhythm with prolonged PR interval and left bundle branch  block.  Recent Labwork: 06/15/2021: ALT 12; AST 23; BUN 14; Creatinine, Ser 1.08; Hemoglobin 13.4; Platelets 207; Potassium 3.8; Sodium 141     Component Value Date/Time   CHOL 168 06/15/2021 1040   TRIG 93 06/15/2021 1040   HDL 85 06/15/2021 1040   CHOLHDL 2.0 06/15/2021 1040   LDLCALC 66 06/15/2021 1040    Other Studies Reviewed Today:  Echocardiogram 04/18/2021:  1. Left ventricular ejection fraction, by estimation, is 60 to 65%. The  left ventricle has normal function. The left ventricle has no regional  wall motion abnormalities. Left ventricular diastolic parameters are  consistent with Grade I diastolic  dysfunction (impaired relaxation). Elevated  left atrial pressure. The  average left ventricular global longitudinal strain is -16.2 %. The global  longitudinal strain is normal.   2. Right ventricular systolic function is normal. The right ventricular  size is normal. There is normal pulmonary artery systolic pressure.   3. The mitral valve is abnormal. Mild mitral valve regurgitation. No  evidence of mitral stenosis.   4. The tricuspid valve is abnormal.   5. The aortic valve has an indeterminant number of cusps. Aortic valve  regurgitation is mild. No aortic stenosis is present.   6. The inferior vena cava is normal in size with greater than 50%  respiratory variability, suggesting right atrial pressure of 3 mmHg.   Assessment and Plan:  1.  Bilateral carotid artery disease with occluded LICA and mild RICA stenosis.  She is asymptomatic and continues on Plavix as well as Crestor.  Recent LDL 66.  2.  Bilateral renal artery stenosis status post previous stent intervention and angioplasty due to in-stent restenosis.  She continues to see Dr. Fletcher Anon with follow-up imaging scheduled later in the year.  3.  Essential hypertension, continue Norvasc, Coreg, and hydralazine.  Medication Adjustments/Labs and Tests Ordered: Current medicines are reviewed at length with the  patient today.  Concerns regarding medicines are outlined above.   Tests Ordered: Orders Placed This Encounter  Procedures   EKG 12-Lead    Medication Changes: No orders of the defined types were placed in this encounter.   Disposition:  Follow up  6 months.  Signed, Satira Sark, MD, [facility] 09/26/2021 11:48 AM    [facility] [place], [place], [place] [address] Phone: [phone]; Fax: [phone]

## 2021-09-26 ENCOUNTER — Ambulatory Visit: Payer: Medicare Other | Attending: Cardiology | Admitting: Cardiology

## 2021-09-26 ENCOUNTER — Encounter: Payer: Self-pay | Admitting: Cardiology

## 2021-09-26 VITALS — BP 142/76 | HR 63 | Ht 63.0 in | Wt 119.0 lb

## 2021-09-26 DIAGNOSIS — I701 Atherosclerosis of renal artery: Secondary | ICD-10-CM | POA: Diagnosis present

## 2021-09-26 DIAGNOSIS — I6523 Occlusion and stenosis of bilateral carotid arteries: Secondary | ICD-10-CM | POA: Diagnosis present

## 2021-09-26 DIAGNOSIS — I10 Essential (primary) hypertension: Secondary | ICD-10-CM | POA: Diagnosis present

## 2021-09-26 NOTE — Patient Instructions (Signed)

## 2021-09-28 ENCOUNTER — Other Ambulatory Visit: Payer: Self-pay | Admitting: Family Medicine

## 2021-09-28 DIAGNOSIS — R110 Nausea: Secondary | ICD-10-CM

## 2021-09-29 NOTE — Telephone Encounter (Signed)
Last OV 06/15/21. Last RF 04/12/21. Next OV 12/13/21 with Lamar Blinks patient

## 2021-10-02 ENCOUNTER — Other Ambulatory Visit: Payer: Self-pay | Admitting: Family Medicine

## 2021-10-02 DIAGNOSIS — J302 Other seasonal allergic rhinitis: Secondary | ICD-10-CM

## 2021-10-02 DIAGNOSIS — K219 Gastro-esophageal reflux disease without esophagitis: Secondary | ICD-10-CM

## 2021-10-02 DIAGNOSIS — I10 Essential (primary) hypertension: Secondary | ICD-10-CM

## 2021-10-06 ENCOUNTER — Other Ambulatory Visit: Payer: Medicare Other

## 2021-10-18 ENCOUNTER — Other Ambulatory Visit: Payer: Medicare Other

## 2021-10-20 ENCOUNTER — Other Ambulatory Visit: Payer: Self-pay | Admitting: Family Medicine

## 2021-10-20 DIAGNOSIS — I6522 Occlusion and stenosis of left carotid artery: Secondary | ICD-10-CM

## 2021-10-20 DIAGNOSIS — Z8673 Personal history of transient ischemic attack (TIA), and cerebral infarction without residual deficits: Secondary | ICD-10-CM

## 2021-10-27 ENCOUNTER — Other Ambulatory Visit: Payer: Self-pay | Admitting: Family Medicine

## 2021-10-27 DIAGNOSIS — I10 Essential (primary) hypertension: Secondary | ICD-10-CM

## 2021-11-21 ENCOUNTER — Other Ambulatory Visit: Payer: Self-pay | Admitting: *Deleted

## 2021-11-21 MED ORDER — ALENDRONATE SODIUM 70 MG PO TABS: ORAL_TABLET | ORAL | 0 refills | Status: DC

## 2021-12-07 ENCOUNTER — Other Ambulatory Visit: Payer: Self-pay | Admitting: Family Medicine

## 2021-12-07 DIAGNOSIS — R110 Nausea: Secondary | ICD-10-CM

## 2021-12-08 NOTE — Telephone Encounter (Signed)
Last OV 06/15/21. Last RF 09/29/21 #20 no RF. Next OV 12/13/21

## 2021-12-13 ENCOUNTER — Ambulatory Visit (INDEPENDENT_AMBULATORY_CARE_PROVIDER_SITE_OTHER): Payer: Medicare Other

## 2021-12-13 ENCOUNTER — Ambulatory Visit: Payer: Medicare Other | Admitting: Family Medicine

## 2021-12-13 ENCOUNTER — Ambulatory Visit (INDEPENDENT_AMBULATORY_CARE_PROVIDER_SITE_OTHER): Payer: Medicare Other | Admitting: Family Medicine

## 2021-12-13 ENCOUNTER — Encounter: Payer: Self-pay | Admitting: Family Medicine

## 2021-12-13 VITALS — BP 112/63 | HR 66 | Temp 97.8°F | Ht 63.0 in | Wt 119.6 lb

## 2021-12-13 DIAGNOSIS — M810 Age-related osteoporosis without current pathological fracture: Secondary | ICD-10-CM

## 2021-12-13 DIAGNOSIS — I6522 Occlusion and stenosis of left carotid artery: Secondary | ICD-10-CM | POA: Diagnosis not present

## 2021-12-13 DIAGNOSIS — I83813 Varicose veins of bilateral lower extremities with pain: Secondary | ICD-10-CM

## 2021-12-13 DIAGNOSIS — E559 Vitamin D deficiency, unspecified: Secondary | ICD-10-CM

## 2021-12-13 DIAGNOSIS — Z8673 Personal history of transient ischemic attack (TIA), and cerebral infarction without residual deficits: Secondary | ICD-10-CM | POA: Diagnosis not present

## 2021-12-13 DIAGNOSIS — N1832 Chronic kidney disease, stage 3b: Secondary | ICD-10-CM

## 2021-12-13 DIAGNOSIS — I10 Essential (primary) hypertension: Secondary | ICD-10-CM

## 2021-12-13 DIAGNOSIS — Z23 Encounter for immunization: Secondary | ICD-10-CM

## 2021-12-13 DIAGNOSIS — K219 Gastro-esophageal reflux disease without esophagitis: Secondary | ICD-10-CM | POA: Diagnosis not present

## 2021-12-13 MED ORDER — HYDRALAZINE HCL 10 MG PO TABS: 20.0000 mg | ORAL_TABLET | Freq: Three times a day (TID) | ORAL | 1 refills | Status: DC

## 2021-12-13 MED ORDER — FAMOTIDINE 20 MG PO TABS: 20.0000 mg | ORAL_TABLET | Freq: Every day | ORAL | 1 refills | Status: DC

## 2021-12-13 MED ORDER — CLOPIDOGREL BISULFATE 75 MG PO TABS: 75.0000 mg | ORAL_TABLET | Freq: Every day | ORAL | 1 refills | Status: DC

## 2021-12-13 NOTE — Progress Notes (Signed)
Subjective:  Patient ID: Tamara King, female    DOB: 07-02-1940, 81 y.o.   MRN: [id number]  Patient Care Team: Baruch Gouty, FNP as PCP - General (Family Medicine) Satira Sark, MD as PCP - Cardiology (Cardiology) Liana Gerold, MD as Consulting Physician (Nephrology) Wellington Hampshire, MD as Consulting Physician (Cardiology) Lavera Guise, [facility] as [place] Management (Pharmacist) Wellington Hampshire, MD as Consulting Physician (Cardiology) Celestia Khat, [place] (Optometry)   Chief Complaint:  Establish Care [facility] patient ) and Medical Management of Chronic Issues   HPI: Tamara King is a 81 y.o. female presenting on 12/13/2021 for [place] [facility]anch [facility] patient ) and Medical Management of Chronic Issues   1. History of CVA (cerebrovascular accident) On Plavix and statin therapy, tolerating well. No recurrent symptoms. Denies weakness, confusion, abnormal bleeding or bruising, or fatigue.   2. Occlusion and stenosis of left carotid artery Followed by cardiology on a regular basis, upcoming carotid duplex for reevaluation. On Plavix and statin therapy.   3. Gastroesophageal reflux disease Currently on Pepcid and Pantoprazole. Tolerating well. No hemoptysis, dysphagia, cough, melena, or hematochezia.   4. Essential (primary) hypertension Compliant with medications. No chest pain, headaches, visual changes, palpitations, weakness, or confusion. Does have leg swelling but not new or worsening.  5. Stage 3b chronic kidney disease ([place]) Followed by nephrology on a regular basis. Does have leg swelling but nothing new or worsening. No weakness, confusion, or shortness of breath.   6. Vitamin D deficiency On repletion therapy. Denies arthralgias or trouble walking. Did fracture her wrist in May, 2023.  7. Age-related osteoporosis without current pathological fracture On Fosamax and tolerating well. Denies dysphagia, loose teeth, gum bleeding,  trouble chewing or swallowing. No reported symptoms concerning for ONJ.   Relevant past medical, surgical, family, and social history reviewed and updated as indicated.  Allergies and medications reviewed and updated. Data reviewed: Chart in Epic.   Past Medical History:  Diagnosis Date   Breast cancer ([place])    Adenocarcinoma   Chronic edema    CKD (chronic kidney disease) stage 3, GFR 30-59 ml/min (HCC) 10/22/2018   Coronary atherosclerosis of native coronary artery    Nonobstructive at catheterization 2003, LVEF normal    Essential hypertension    GERD (gastroesophageal reflux disease) 07/06/2013   Idiopathic peripheral neuropathy 10/06/2013   Mixed hyperlipidemia    Occlusion and stenosis of left carotid artery 10/22/2017   Renal artery stenosis (HCC)    BMS bilateally 2003, PTCA bilaterally 2005 with restenosis   Sleep apnea    Stop Bang score of 4   Stroke [facility])    Symptomatic varicose veins, right 10/16/2018    Past Surgical History:  Procedure Laterality Date   APPENDECTOMY     CATARACT EXTRACTION W/PHACO  03/29/2011   Procedure: CATARACT EXTRACTION PHACO AND INTRAOCULAR LENS PLACEMENT ([place]);  Surgeon: Tonny Branch, MD;  Location: [facility];  Service: Ophthalmology;  Laterality: Right;  CDE:12.82   CATARACT EXTRACTION W/PHACO  04/16/2011   Procedure: CATARACT EXTRACTION PHACO AND INTRAOCULAR LENS PLACEMENT ([facility]);  Surgeon: Tonny Branch, MD;  Location: [facility];  Service: Ophthalmology;  Laterality: Left;  CDE: 11.54   CESAREAN SECTION     x2   CHOLECYSTECTOMY  2018   HEMORRHOID SURGERY     RENAL ANGIOGRAPHY N/A 05/20/2019   Procedure: RENAL ANGIOGRAPHY;  Surgeon: Wellington Hampshire, MD;  Location: [place] [facility];  Service: Cardiovascular;  Laterality: N/A;  RENAL INTERVENTION  05/20/2019   Procedure: RENAL INTERVENTION;  Surgeon: Wellington Hampshire, MD;  Location: [place] [facility];  Service: Cardiovascular;;   Right breast lumpectomy     ROTATOR CUFF REPAIR  2008   UPPER  EXTREMITY ANGIOGRAPHY N/A 11/27/2017   Procedure: UPPER EXTREMITY ANGIOGRAPHY;  Surgeon: Marty Heck, MD;  Location: [place] [facility];  Service: Cardiovascular;  Laterality: N/A;    Social History   Socioeconomic History   Marital status: Widowed    Spouse name: Not on file   Number of children: 2   Years of education: 12th   Highest education level: 12th grade  Occupational History    Employer: RETIRED    Comment: n/a  Tobacco Use   Smoking status: Former    Packs/day: 1.00    Years: 35.00    Total pack years: 35.00    Types: Cigarettes    Quit date: 01/22/1998    Years since quitting: 23.9   Smokeless tobacco: Never   Tobacco comments:    tobacco use - no  Vaping Use   Vaping Use: Never used  Substance and Sexual Activity   Alcohol use: No   Drug use: No   Sexual activity: Not on file  Other Topics Concern   Not on file  Social History Narrative   Patient lives at home with family.  Two sons and two granddaughters live with her.   Caffeine use; 2-5 cups daily   Oldest son has cancer   Social Determinants of Health   Financial Resource Strain: Low Risk  (06/27/2021)   Overall Financial Resource Strain (CARDIA)    Difficulty of Paying Living Expenses: Not hard at all  Food Insecurity: No Food Insecurity (06/27/2021)   Hunger Vital Sign    Worried About Running Out of Food in the Last Year: Never true    Ran Out of Food in the Last Year: Never true  Transportation Needs: No Transportation Needs (06/27/2021)   PRAPARE - Hydrologist (Medical): No    Lack of Transportation (Non-Medical): No  Physical Activity: Insufficiently Active (06/27/2021)   Exercise Vital Sign    Days of Exercise per Week: 7 days    Minutes of Exercise per Session: 10 min  Stress: Stress Concern Present (06/27/2021)   [place]    Feeling of Stress : To some extent  Social Connections: Socially  Isolated (06/27/2021)   Social Connection and Isolation Panel [NHANES]    Frequency of Communication with Friends and Family: More than three times a week    Frequency of Social Gatherings with Friends and Family: Once a week    Attends Religious Services: Never    Marine scientist or Organizations: No    Attends Archivist Meetings: Never    Marital Status: Widowed  Intimate Partner Violence: Not At Risk (06/27/2021)   Humiliation, Afraid, Rape, and Kick questionnaire    Fear of Current or Ex-Partner: No    Emotionally Abused: No    Physically Abused: No    Sexually Abused: No    Outpatient Encounter Medications as of 12/13/2021  Medication Sig   acetaminophen (TYLENOL) 500 MG tablet Take 500 mg by mouth every 6 (six) hours as needed for moderate pain.   albuterol (VENTOLIN HFA) 108 (90 Base) MCG/ACT inhaler INHALE 2 PUFFS BY MOUTH EVERY 6 HOURS AS NEEDED FOR WHEEZING AND FOR SHORTNESS OF BREATH   alendronate (  FOSAMAX) 70 MG tablet TAKE 1 TABLET BY MOUTH ONCE A WEEK TAKE  WITH  A  FULL  GLASS  OF  WATER  ON  AN  EMPTY  STOMACH   amLODipine (NORVASC) 5 MG tablet Take 1 tablet by mouth once daily   carvedilol (COREG) 25 MG tablet Take 1 tablet by mouth twice daily   cetirizine (ZYRTEC) 10 MG tablet Take 1 tablet by mouth once daily   Cholecalciferol (VITAMIN D3) 25 MCG (1000 UT) CAPS Take 1 capsule by mouth daily.   fluticasone (FLONASE) 50 MCG/ACT nasal spray Place 2 sprays into the nose daily as needed for allergies (congestion.).    furosemide (LASIX) 40 MG tablet Take 1 tablet (40 mg total) by mouth 2 (two) times daily.   pantoprazole (PROTONIX) 40 MG tablet Take 1 tablet by mouth once daily   potassium chloride SA (KLOR-CON M) 20 MEQ tablet Take 20 mEq by mouth 2 (two) times daily.   rosuvastatin (CRESTOR) 40 MG tablet Take 1 tablet by mouth once daily   [DISCONTINUED] clopidogrel (PLAVIX) 75 MG tablet Take 1 tablet by mouth once daily   [DISCONTINUED] famotidine  (PEPCID) 20 MG tablet Take 1 tablet by mouth once daily   [DISCONTINUED] hydrALAZINE (APRESOLINE) 10 MG tablet TAKE 2 TABLETS BY MOUTH THREE TIMES DAILY   [DISCONTINUED] ondansetron (ZOFRAN) 4 MG tablet TAKE 1 TABLET BY MOUTH EVERY 8 HOURS AS NEEDED FOR NAUSEA AND VOMITING   clopidogrel (PLAVIX) 75 MG tablet Take 1 tablet (75 mg total) by mouth daily.   famotidine (PEPCID) 20 MG tablet Take 1 tablet (20 mg total) by mouth daily.   hydrALAZINE (APRESOLINE) 10 MG tablet Take 2 tablets (20 mg total) by mouth 3 (three) times daily.   [DISCONTINUED] Tetrahydrozoline HCl (VISINE OP) Place 2 drops into both eyes daily as needed (dry eyes). (Patient not taking: Reported on 12/13/2021)   [DISCONTINUED] traMADol (ULTRAM) 50 MG tablet Take 1 tablet (50 mg total) by mouth every 12 (twelve) hours as needed.   No facility-administered encounter medications on file as of 12/13/2021.    Allergies  Allergen Reactions   Losartan Anaphylaxis   Sulfa Antibiotics Anaphylaxis and Swelling   Codeine Nausea And Vomiting   Fish Allergy Other (See Comments)    "sick as a dog"   Other Other (See Comments)    Bologna - vomiting   Spironolactone Other (See Comments)    Acute kidney injury   Vancomycin Other (See Comments)    "drives me crazy"   Penicillins Rash    Has patient had a PCN reaction causing immediate rash, facial/tongue/throat swelling, SOB or lightheadedness with hypotension: Unknown Has patient had a PCN reaction causing severe rash involving mucus membranes or skin necrosis: Unknown Has patient had a PCN reaction that required hospitalization: No Has patient had a PCN reaction occurring within the last 10 years: No Childhood reaction. If all of the above answers are "NO", then may proceed with Cephalosporin use.     Review of Systems  Constitutional:  Negative for activity change, appetite change, chills, diaphoresis, fatigue, fever and unexpected weight change.  HENT: Negative.  Negative for  congestion, dental problem, sore throat, trouble swallowing and voice change.   Eyes: Negative.  Negative for photophobia and visual disturbance.  Respiratory:  Negative for apnea, cough, choking, chest tightness, shortness of breath, wheezing and stridor.   Cardiovascular:  Positive for leg swelling. Negative for chest pain and palpitations.  Gastrointestinal:  Negative for abdominal pain, blood in stool,  constipation, diarrhea, nausea and vomiting.  Endocrine: Negative.  Negative for polydipsia, polyphagia and polyuria.  Genitourinary:  Negative for decreased urine volume, difficulty urinating, dysuria, frequency and urgency.  Musculoskeletal:  Positive for myalgias. Negative for arthralgias.  Skin: Negative.   Allergic/Immunologic: Negative.   Neurological:  Negative for dizziness, tremors, seizures, syncope, facial asymmetry, speech difficulty, weakness, light-headedness, numbness and headaches.  Hematological: Negative.   Psychiatric/Behavioral:  Negative for confusion, hallucinations, sleep disturbance and suicidal ideas.   All other systems reviewed and are negative.       Objective:  BP 112/63   Pulse 66   Temp 97.8 F (36.6 C) (Temporal)   Ht _0  (1.6 m)   Wt 119 lb 9.6 oz (54.3 kg)   SpO2 95%   BMI 21.19 kg/m    Wt Readings from Last 3 Encounters:  12/13/21 119 lb 9.6 oz (54.3 kg)  09/26/21 119 lb (54 kg)  09/19/21 118 lb (53.5 kg)    Physical Exam Vitals and nursing note reviewed.  Constitutional:      General: She is not in acute distress.    Appearance: Normal appearance. She is well-developed, well-groomed and normal weight. She is not ill-appearing, toxic-appearing or diaphoretic.  HENT:     Head: Normocephalic and atraumatic.     Jaw: There is normal jaw occlusion.     Right Ear: Hearing normal.     Left Ear: Hearing normal.     Nose: Nose normal.     Mouth/Throat:     Lips: Pink.     Mouth: Mucous membranes are moist.     Pharynx: Oropharynx is  clear. Uvula midline.  Eyes:     General: Lids are normal.     Extraocular Movements: Extraocular movements intact.     Conjunctiva/sclera: Conjunctivae normal.     Pupils: Pupils are equal, round, and reactive to light.  Neck:     Thyroid: No thyroid mass, thyromegaly or thyroid tenderness.     Vascular: Decreased carotid pulses. Carotid bruit present. No JVD.     Trachea: Trachea and phonation normal.  Cardiovascular:     Rate and Rhythm: Normal rate and regular rhythm.     Chest Wall: PMI is not displaced.     Heart sounds: Normal heart sounds. No murmur heard.    No friction rub. No gallop.     Comments: BLE swelling with multiple varicose veins present, no ruptured veins noted.  Pulmonary:     Effort: Pulmonary effort is normal. No respiratory distress.     Breath sounds: Normal breath sounds. No wheezing.  Abdominal:     General: Bowel sounds are normal. There is no distension or abdominal bruit.     Palpations: Abdomen is soft. There is no hepatomegaly or splenomegaly.     Tenderness: There is no abdominal tenderness. There is no right CVA tenderness or left CVA tenderness.     Hernia: No hernia is present.  Musculoskeletal:        General: Normal range of motion.     Cervical back: Normal range of motion and neck supple.     Right lower leg: 2+ Edema present.     Left lower leg: 2+ Edema present.  Lymphadenopathy:     Cervical: No cervical adenopathy.  Skin:    General: Skin is warm and dry.     Capillary Refill: Capillary refill takes less than 2 seconds.     Coloration: Skin is not cyanotic, jaundiced or pale.  Findings: No rash.  Neurological:     General: No focal deficit present.     Mental Status: She is alert and oriented to person, place, and time.     Sensory: Sensation is intact.     Motor: Motor function is intact.     Coordination: Coordination is intact.     Gait: Gait is intact.     Deep Tendon Reflexes: Reflexes are normal and symmetric.   Psychiatric:        Attention and Perception: Attention and perception normal.        Mood and Affect: Mood and affect normal.        Speech: Speech normal.        Behavior: Behavior normal. Behavior is cooperative.        Thought Content: Thought content normal.        Cognition and Memory: Cognition and memory normal.        Judgment: Judgment normal.     Results for orders placed or performed in visit on 06/15/21  CBC with Differential/Platelet  Result Value Ref Range   WBC 7.6 3.4 - 10.8 x10E3/uL   RBC 4.41 3.77 - 5.28 x10E6/uL   Hemoglobin 13.4 11.1 - 15.9 g/dL   Hematocrit 39.3 34.0 - 46.6 %   MCV 89 79 - 97 fL   MCH 30.4 26.6 - 33.0 pg   MCHC 34.1 31.5 - 35.7 g/dL   RDW 12.8 11.7 - 15.4 %   Platelets 207 150 - 450 x10E3/uL   Neutrophils 61 Not Estab. %   Lymphs 29 Not Estab. %   Monocytes 7 Not Estab. %   Eos 2 Not Estab. %   Basos 1 Not Estab. %   Neutrophils Absolute 4.7 1.4 - 7.0 x10E3/uL   Lymphocytes Absolute 2.2 0.7 - 3.1 x10E3/uL   Monocytes Absolute 0.5 0.1 - 0.9 x10E3/uL   EOS (ABSOLUTE) 0.2 0.0 - 0.4 x10E3/uL   Basophils Absolute 0.1 0.0 - 0.2 x10E3/uL   Immature Granulocytes 0 Not Estab. %   Immature Grans (Abs) 0.0 0.0 - 0.1 x10E3/uL  CMP14+EGFR  Result Value Ref Range   Glucose 114 (H) 70 - 99 mg/dL   BUN 14 8 - 27 mg/dL   Creatinine, Ser 1.08 (H) 0.57 - 1.00 mg/dL   eGFR 53 (L) >59 mL/min/1.73   BUN/Creatinine Ratio 13 12 - 28   Sodium 141 134 - 144 mmol/L   Potassium 3.8 3.5 - 5.2 mmol/L   Chloride 100 96 - 106 mmol/L   CO2 24 20 - 29 mmol/L   Calcium 9.0 8.7 - 10.3 mg/dL   Total Protein 7.0 6.0 - 8.5 g/dL   Albumin 4.7 3.7 - 4.7 g/dL   Globulin, Total 2.3 1.5 - 4.5 g/dL   Albumin/Globulin Ratio 2.0 1.2 - 2.2   Bilirubin Total 0.6 0.0 - 1.2 mg/dL   Alkaline Phosphatase 60 44 - 121 IU/L   AST 23 0 - 40 IU/L   ALT 12 0 - 32 IU/L  Lipid panel  Result Value Ref Range   Cholesterol, Total 168 100 - 199 mg/dL   Triglycerides 93 0 - 149 mg/dL    HDL 85 >39 mg/dL   VLDL Cholesterol Cal 17 5 - 40 mg/dL   LDL Chol Calc (NIH) 66 0 - 99 mg/dL   Chol/HDL Ratio 2.0 0.0 - 4.4 ratio       Pertinent labs & imaging results that were available during my care of the patient were reviewed by  me and considered in my medical decision making.  Assessment & Plan:  Verma was seen today for establish care and medical management of chronic issues.  Diagnoses and all orders for this visit:  Stage 3b chronic kidney disease ([place]) Followed by nephrology on a regular basis. Will repeat below labs today.  -     CBC with Differential/Platelet -     CMP14+EGFR -     VITAMIN D 25 Hydroxy (Vit-D Deficiency, Fractures)  History of CVA (cerebrovascular accident) Doing well on Plavix and statin therapy. No recurrent symptoms.  -     clopidogrel (PLAVIX) 75 MG tablet; Take 1 tablet (75 mg total) by mouth daily.  Occlusion and stenosis of left carotid artery Followed by cardiology on a regular basis. On statin and Plavix.  -     clopidogrel (PLAVIX) 75 MG tablet; Take 1 tablet (75 mg total) by mouth daily.  Gastroesophageal reflux disease without esophagitis No red flags present. Diet discussed. Avoid fried, spicy, fatty, greasy, and acidic foods. Avoid caffeine, nicotine, and alcohol. Do not eat 2-3 hours before bedtime and stay upright for at least 1-2 hours after eating. Eat small frequent meals. Avoid NSAID's like motrin and aleve. Medications as prescribed. Report any new or worsening symptoms. Follow up as discussed or sooner if needed.   -     famotidine (PEPCID) 20 MG tablet; Take 1 tablet (20 mg total) by mouth daily. -     CBC with Differential/Platelet  Essential (primary) hypertension BP well controlled. Changes were not made in regimen today. Goal BP is 130/80. Pt aware to report any persistent high or low readings. DASH diet and exercise encouraged. Exercise at least 150 minutes per week and increase as tolerated. Goal BMI > 25. Stress  management encouraged. Avoid nicotine and tobacco product use. Avoid excessive alcohol and NSAID's. Avoid more than 2000 mg of sodium daily. Medications as prescribed. Follow up as scheduled.  -     hydrALAZINE (APRESOLINE) 10 MG tablet; Take 2 tablets (20 mg total) by mouth 3 (three) times daily. -     CBC with Differential/Platelet -     CMP14+EGFR -     Lipid panel -     Thyroid Panel With TSH -     VITAMIN D 25 Hydroxy (Vit-D Deficiency, Fractures)  Vitamin D deficiency Labs pending. Continue repletion therapy. If indicated, will change repletion dosage. Eat foods rich in Vit D including milk, orange juice, yogurt with vitamin D added, salmon or mackerel, canned tuna fish, cereals with vitamin D added, and cod liver oil. Get out in the sun but make sure to wear at least SPF 30 sunscreen.  -     VITAMIN D 25 Hydroxy (Vit-D Deficiency, Fractures)  Age-related osteoporosis without current pathological fracture Will repeat DEXA today. Continue Fosamax. No ONJ symptoms present.  -     VITAMIN D 25 Hydroxy (Vit-D Deficiency, Fractures) -     DG [facility] DEXA; Future  Varicose veins of both lower extremities with pain Declines referral to vascular for evaluation. Discussed use of compression stockings in detail. Report new, worsening, or persistent symptoms.   Need for immunization against influenza -     Flu Vaccine QUAD High Dose(Fluad)     Continue all other maintenance medications.  Follow up plan: Return if symptoms worsen or fail to improve.   Continue healthy lifestyle choices, including diet (rich in fruits, vegetables, and lean proteins, and low in salt and simple carbohydrates) and exercise (at least  30 minutes of moderate physical activity daily).  Educational handout given for health maintenance, varicose veins  The above assessment and management plan was discussed with the patient. The patient verbalized understanding of and has agreed to the management plan. Patient is  aware to call the clinic if they develop any new symptoms or if symptoms persist or worsen. Patient is aware when to return to the clinic for a follow-up visit. Patient educated on when it is appropriate to go to the emergency department.   Monia Pouch, FNP-C [facility] [phone]

## 2021-12-14 LAB — LIPID PANEL
Chol/HDL Ratio: 2.1 ratio (ref 0.0–4.4)
Cholesterol, Total: 175 mg/dL (ref 100–199)
HDL: 84 mg/dL (ref >39)
LDL Chol Calc (NIH): 63 mg/dL (ref 0–99)
Triglycerides: 174 mg/dL — ABNORMAL HIGH (ref 0–149)
VLDL Cholesterol Cal: 28 mg/dL (ref 5–40)

## 2021-12-14 LAB — CMP14+EGFR
ALT: 24 IU/L (ref 0–32)
AST: 32 IU/L (ref 0–40)
Albumin/Globulin Ratio: 2.3 — ABNORMAL HIGH (ref 1.2–2.2)
Albumin: 4.8 g/dL (ref 3.8–4.8)
Alkaline Phosphatase: 63 IU/L (ref 44–121)
BUN/Creatinine Ratio: 12 (ref 12–28)
BUN: 15 mg/dL (ref 8–27)
Bilirubin Total: 0.5 mg/dL (ref 0.0–1.2)
CO2: 24 mmol/L (ref 20–29)
Calcium: 9.8 mg/dL (ref 8.7–10.3)
Chloride: 98 mmol/L (ref 96–106)
Creatinine, Ser: 1.26 mg/dL — ABNORMAL HIGH (ref 0.57–1.00)
Globulin, Total: 2.1 g/dL (ref 1.5–4.5)
Glucose: 118 mg/dL — ABNORMAL HIGH (ref 70–99)
Potassium: 3.7 mmol/L (ref 3.5–5.2)
Sodium: 140 mmol/L (ref 134–144)
Total Protein: 6.9 g/dL (ref 6.0–8.5)
eGFR: 43 mL/min/{1.73_m2} — ABNORMAL LOW (ref >59)

## 2021-12-14 LAB — CBC WITH DIFFERENTIAL/PLATELET
Basophils Absolute: 0.1 10*3/uL (ref 0.0–0.2)
Basos: 1 %
EOS (ABSOLUTE): 0.2 10*3/uL (ref 0.0–0.4)
Eos: 3 %
Hematocrit: 40.1 % (ref 34.0–46.6)
Hemoglobin: 13.5 g/dL (ref 11.1–15.9)
Immature Grans (Abs): 0 10*3/uL (ref 0.0–0.1)
Immature Granulocytes: 0 %
Lymphocytes Absolute: 2.3 10*3/uL (ref 0.7–3.1)
Lymphs: 30 %
MCH: 30.1 pg (ref 26.6–33.0)
MCHC: 33.7 g/dL (ref 31.5–35.7)
MCV: 90 fL (ref 79–97)
Monocytes Absolute: 0.5 10*3/uL (ref 0.1–0.9)
Monocytes: 7 %
Neutrophils Absolute: 4.5 10*3/uL (ref 1.4–7.0)
Neutrophils: 59 %
Platelets: 236 10*3/uL (ref 150–450)
RBC: 4.48 x10E6/uL (ref 3.77–5.28)
RDW: 12.7 % (ref 11.7–15.4)
WBC: 7.6 10*3/uL (ref 3.4–10.8)

## 2021-12-14 LAB — THYROID PANEL WITH TSH
Free Thyroxine Index: 2.7 (ref 1.2–4.9)
T3 Uptake Ratio: 27 % (ref 24–39)
T4, Total: 10.1 ug/dL (ref 4.5–12.0)
TSH: 1.85 u[IU]/mL (ref 0.450–4.500)

## 2021-12-14 LAB — VITAMIN D 25 HYDROXY (VIT D DEFICIENCY, FRACTURES): Vit D, 25-Hydroxy: 39.4 ng/mL (ref 30.0–100.0)

## 2022-01-01 ENCOUNTER — Other Ambulatory Visit: Payer: Self-pay | Admitting: Family Medicine

## 2022-01-01 DIAGNOSIS — K219 Gastro-esophageal reflux disease without esophagitis: Secondary | ICD-10-CM

## 2022-01-01 DIAGNOSIS — J302 Other seasonal allergic rhinitis: Secondary | ICD-10-CM

## 2022-01-03 ENCOUNTER — Ambulatory Visit: Payer: Medicare Other | Attending: Cardiovascular Disease

## 2022-01-03 DIAGNOSIS — I701 Atherosclerosis of renal artery: Secondary | ICD-10-CM | POA: Insufficient documentation

## 2022-01-04 ENCOUNTER — Other Ambulatory Visit: Payer: Medicare Other

## 2022-01-05 ENCOUNTER — Other Ambulatory Visit: Payer: Self-pay | Admitting: *Deleted

## 2022-01-05 DIAGNOSIS — I701 Atherosclerosis of renal artery: Secondary | ICD-10-CM

## 2022-01-27 ENCOUNTER — Other Ambulatory Visit: Payer: Self-pay | Admitting: Family Medicine

## 2022-01-27 DIAGNOSIS — I10 Essential (primary) hypertension: Secondary | ICD-10-CM

## 2022-02-05 ENCOUNTER — Other Ambulatory Visit: Payer: Self-pay | Admitting: Cardiovascular Disease

## 2022-02-05 DIAGNOSIS — E876 Hypokalemia: Secondary | ICD-10-CM | POA: Diagnosis not present

## 2022-02-05 DIAGNOSIS — E211 Secondary hyperparathyroidism, not elsewhere classified: Secondary | ICD-10-CM | POA: Diagnosis not present

## 2022-02-05 DIAGNOSIS — I5032 Chronic diastolic (congestive) heart failure: Secondary | ICD-10-CM | POA: Diagnosis not present

## 2022-02-05 DIAGNOSIS — I129 Hypertensive chronic kidney disease with stage 1 through stage 4 chronic kidney disease, or unspecified chronic kidney disease: Secondary | ICD-10-CM | POA: Diagnosis not present

## 2022-02-05 DIAGNOSIS — N280 Ischemia and infarction of kidney: Secondary | ICD-10-CM | POA: Diagnosis not present

## 2022-02-05 DIAGNOSIS — N1831 Chronic kidney disease, stage 3a: Secondary | ICD-10-CM | POA: Diagnosis not present

## 2022-02-05 DIAGNOSIS — R809 Proteinuria, unspecified: Secondary | ICD-10-CM | POA: Diagnosis not present

## 2022-02-05 DIAGNOSIS — I701 Atherosclerosis of renal artery: Secondary | ICD-10-CM

## 2022-02-12 ENCOUNTER — Other Ambulatory Visit: Payer: Self-pay | Admitting: Family Medicine

## 2022-02-12 DIAGNOSIS — R600 Localized edema: Secondary | ICD-10-CM

## 2022-02-12 DIAGNOSIS — I10 Essential (primary) hypertension: Secondary | ICD-10-CM

## 2022-02-12 DIAGNOSIS — Z8673 Personal history of transient ischemic attack (TIA), and cerebral infarction without residual deficits: Secondary | ICD-10-CM

## 2022-02-12 DIAGNOSIS — I6522 Occlusion and stenosis of left carotid artery: Secondary | ICD-10-CM

## 2022-02-12 DIAGNOSIS — I701 Atherosclerosis of renal artery: Secondary | ICD-10-CM

## 2022-02-12 DIAGNOSIS — E785 Hyperlipidemia, unspecified: Secondary | ICD-10-CM

## 2022-03-21 ENCOUNTER — Encounter: Payer: Self-pay | Admitting: Family Medicine

## 2022-03-21 ENCOUNTER — Ambulatory Visit (INDEPENDENT_AMBULATORY_CARE_PROVIDER_SITE_OTHER): Payer: Medicare Other | Admitting: Family Medicine

## 2022-03-21 VITALS — BP 138/77 | HR 72 | Temp 97.3°F | Ht 63.0 in | Wt 118.8 lb

## 2022-03-21 DIAGNOSIS — I10 Essential (primary) hypertension: Secondary | ICD-10-CM

## 2022-03-21 DIAGNOSIS — E559 Vitamin D deficiency, unspecified: Secondary | ICD-10-CM

## 2022-03-21 DIAGNOSIS — E782 Mixed hyperlipidemia: Secondary | ICD-10-CM

## 2022-03-21 DIAGNOSIS — N1832 Chronic kidney disease, stage 3b: Secondary | ICD-10-CM

## 2022-03-21 DIAGNOSIS — I129 Hypertensive chronic kidney disease with stage 1 through stage 4 chronic kidney disease, or unspecified chronic kidney disease: Secondary | ICD-10-CM | POA: Diagnosis not present

## 2022-03-21 DIAGNOSIS — M810 Age-related osteoporosis without current pathological fracture: Secondary | ICD-10-CM | POA: Diagnosis not present

## 2022-03-21 LAB — LIPID PANEL

## 2022-03-21 NOTE — Progress Notes (Signed)
Subjective:  Patient ID: Tamara King, female    DOB: March 22, 1940, 82 y.o.   MRN: [id number]  Patient Care Team: Baruch Gouty, FNP as PCP - General (Family Medicine) Satira Sark, MD as PCP - Cardiology (Cardiology) Liana Gerold, MD as Consulting Physician (Nephrology) Wellington Hampshire, MD as Consulting Physician (Cardiology) Lavera Guise, [facility] as [place] Management (Pharmacist) Wellington Hampshire, MD as Consulting Physician (Cardiology) Celestia Khat, [place] (Optometry)   Chief Complaint:  Medical Management of Chronic Issues (3 month follow up )   HPI: Tamara King is a 82 y.o. female presenting on 03/21/2022 for Medical Management of Chronic Issues (3 month follow up )    1. Essential (primary) hypertension She is followed by cardiology on a regular basis and has an upcoming visit scheduled. She is compliant with medications and denies associated side effects. No headaches, weakness, confusion, or syncope. Did have an episode of palpitations several weeks ago, this has since resolved.   2. Stage 3b chronic kidney disease ([place]) Followed by nephrology on a regular basis. Denies confusion, weakness, leg swelling, decreased urine output or weight changes. States she has been a little tired at times.   3. Mixed hyperlipidemia Compliant with medications and tolerating well. Does follow a healthy diet. Does not exercise on a regular basis.   4. Vitamin D deficiency On Vit D repletion therapy and tolerating well. No recent fractures.   5. Age-related osteoporosis without current pathological fracture DEXA completed 11/2021. On Fosamax and tolerating well. No ONJ symptoms reported.      Relevant past medical, surgical, family, and social history reviewed and updated as indicated.  Allergies and medications reviewed and updated. Data reviewed: Chart in Epic.   Past Medical History:  Diagnosis Date   Breast cancer ([place])    Adenocarcinoma    Chronic edema    CKD (chronic kidney disease) stage 3, GFR 30-59 ml/min (HCC) 10/22/2018   Coronary atherosclerosis of native coronary artery    Nonobstructive at catheterization 2003, LVEF normal    Essential hypertension    GERD (gastroesophageal reflux disease) 07/06/2013   Idiopathic peripheral neuropathy 10/06/2013   Mixed hyperlipidemia    Occlusion and stenosis of left carotid artery 10/22/2017   Renal artery stenosis (HCC)    BMS bilateally 2003, PTCA bilaterally 2005 with restenosis   Sleep apnea    Stop Bang score of 4   Stroke [facility])    Symptomatic varicose veins, right 10/16/2018    Past Surgical History:  Procedure Laterality Date   APPENDECTOMY     CATARACT EXTRACTION W/PHACO  03/29/2011   Procedure: CATARACT EXTRACTION PHACO AND INTRAOCULAR LENS PLACEMENT ([place]);  Surgeon: Tonny Branch, MD;  Location: [facility];  Service: Ophthalmology;  Laterality: Right;  CDE:12.82   CATARACT EXTRACTION W/PHACO  04/16/2011   Procedure: CATARACT EXTRACTION PHACO AND INTRAOCULAR LENS PLACEMENT ([facility]);  Surgeon: Tonny Branch, MD;  Location: [facility];  Service: Ophthalmology;  Laterality: Left;  CDE: 11.54   CESAREAN SECTION     x2   CHOLECYSTECTOMY  2018   HEMORRHOID SURGERY     RENAL ANGIOGRAPHY N/A 05/20/2019   Procedure: RENAL ANGIOGRAPHY;  Surgeon: Wellington Hampshire, MD;  Location: [place] [facility];  Service: Cardiovascular;  Laterality: N/A;   RENAL INTERVENTION  05/20/2019   Procedure: RENAL INTERVENTION;  Surgeon: Wellington Hampshire, MD;  Location: [place] [facility];  Service: Cardiovascular;;   Right breast lumpectomy  ROTATOR CUFF REPAIR  2008   UPPER EXTREMITY ANGIOGRAPHY N/A 11/27/2017   Procedure: UPPER EXTREMITY ANGIOGRAPHY;  Surgeon: Marty Heck, MD;  Location: [place] [facility];  Service: Cardiovascular;  Laterality: N/A;    Social History   Socioeconomic History   Marital status: Widowed    Spouse name: Not on file   Number of children: 2   Years of education: 12th    Highest education level: 12th grade  Occupational History    Employer: RETIRED    Comment: n/a  Tobacco Use   Smoking status: Former    Packs/day: 1.00    Years: 35.00    Total pack years: 35.00    Types: Cigarettes    Quit date: 01/22/1998    Years since quitting: 24.1   Smokeless tobacco: Never   Tobacco comments:    tobacco use - no  Vaping Use   Vaping Use: Never used  Substance and Sexual Activity   Alcohol use: No   Drug use: No   Sexual activity: Not on file  Other Topics Concern   Not on file  Social History Narrative   Patient lives at home with family.  Two sons and two granddaughters live with her.   Caffeine use; 2-5 cups daily   Oldest son has cancer   Social Determinants of Health   Financial Resource Strain: Low Risk  (06/27/2021)   Overall Financial Resource Strain (CARDIA)    Difficulty of Paying Living Expenses: Not hard at all  Food Insecurity: No Food Insecurity (06/27/2021)   Hunger Vital Sign    Worried About Running Out of Food in the Last Year: Never true    Ran Out of Food in the Last Year: Never true  Transportation Needs: No Transportation Needs (06/27/2021)   PRAPARE - Hydrologist (Medical): No    Lack of Transportation (Non-Medical): No  Physical Activity: Insufficiently Active (06/27/2021)   Exercise Vital Sign    Days of Exercise per Week: 7 days    Minutes of Exercise per Session: 10 min  Stress: Stress Concern Present (06/27/2021)   [place]    Feeling of Stress : To some extent  Social Connections: Socially Isolated (06/27/2021)   Social Connection and Isolation Panel [NHANES]    Frequency of Communication with Friends and Family: More than three times a week    Frequency of Social Gatherings with Friends and Family: Once a week    Attends Religious Services: Never    Marine scientist or Organizations: No    Attends Archivist  Meetings: Never    Marital Status: Widowed  Intimate Partner Violence: Not At Risk (06/27/2021)   Humiliation, Afraid, Rape, and Kick questionnaire    Fear of Current or Ex-Partner: No    Emotionally Abused: No    Physically Abused: No    Sexually Abused: No    Outpatient Encounter Medications as of 03/21/2022  Medication Sig   acetaminophen (TYLENOL) 500 MG tablet Take 500 mg by mouth every 6 (six) hours as needed for moderate pain.   albuterol (VENTOLIN HFA) 108 (90 Base) MCG/ACT inhaler INHALE 2 PUFFS BY MOUTH EVERY 6 HOURS AS NEEDED FOR WHEEZING AND FOR SHORTNESS OF BREATH   alendronate (FOSAMAX) 70 MG tablet TAKE 1 TABLET BY MOUTH ONCE A WEEK WITH A FULL GLASS OF WATER ON AN EMPTY STOMACH   amLODipine (NORVASC) 5 MG tablet Take 1 tablet by  mouth once daily   carvedilol (COREG) 25 MG tablet Take 1 tablet by mouth twice daily   cetirizine (ZYRTEC) 10 MG tablet Take 1 tablet by mouth once daily   Cholecalciferol (VITAMIN D3) 25 MCG (1000 UT) CAPS Take 1 capsule by mouth daily.   clopidogrel (PLAVIX) 75 MG tablet Take 1 tablet (75 mg total) by mouth daily.   famotidine (PEPCID) 20 MG tablet Take 1 tablet (20 mg total) by mouth daily.   fluticasone (FLONASE) 50 MCG/ACT nasal spray Place 2 sprays into the nose daily as needed for allergies (congestion.).    furosemide (LASIX) 40 MG tablet Take 1 tablet by mouth twice daily   hydrALAZINE (APRESOLINE) 10 MG tablet Take 2 tablets (20 mg total) by mouth 3 (three) times daily.   pantoprazole (PROTONIX) 40 MG tablet Take 1 tablet by mouth once daily   potassium chloride SA (KLOR-CON M) 20 MEQ tablet Take 20 mEq by mouth 2 (two) times daily.   rosuvastatin (CRESTOR) 40 MG tablet Take 1 tablet by mouth once daily   No facility-administered encounter medications on file as of 03/21/2022.    Allergies  Allergen Reactions   Losartan Anaphylaxis   Sulfa Antibiotics Anaphylaxis and Swelling   Codeine Nausea And Vomiting   Fish Allergy Other (See  Comments)    "sick as a dog"   Other Other (See Comments)    Bologna - vomiting   Spironolactone Other (See Comments)    Acute kidney injury   Vancomycin Other (See Comments)    "drives me crazy"   Penicillins Rash    Has patient had a PCN reaction causing immediate rash, facial/tongue/throat swelling, SOB or lightheadedness with hypotension: Unknown Has patient had a PCN reaction causing severe rash involving mucus membranes or skin necrosis: Unknown Has patient had a PCN reaction that required hospitalization: No Has patient had a PCN reaction occurring within the last 10 years: No Childhood reaction. If all of the above answers are "NO", then may proceed with Cephalosporin use.     Review of Systems  Constitutional:  Positive for fatigue. Negative for activity change, appetite change, chills, diaphoresis, fever and unexpected weight change.  HENT: Negative.    Eyes: Negative.  Negative for photophobia and visual disturbance.  Respiratory:  Negative for cough, chest tightness and shortness of breath.   Cardiovascular:  Negative for chest pain, palpitations and leg swelling.  Gastrointestinal:  Negative for abdominal pain, blood in stool, constipation, diarrhea, nausea and vomiting.  Endocrine: Negative.   Genitourinary:  Negative for decreased urine volume, difficulty urinating, dysuria, frequency and urgency.  Musculoskeletal:  Negative for arthralgias and myalgias.  Skin: Negative.   Allergic/Immunologic: Negative.   Neurological:  Negative for dizziness, tremors, seizures, syncope, facial asymmetry, speech difficulty, weakness, light-headedness, numbness and headaches.  Hematological: Negative.   Psychiatric/Behavioral:  Negative for confusion, hallucinations, sleep disturbance and suicidal ideas.   All other systems reviewed and are negative.       Objective:  BP 138/77   Pulse 72   Temp (!) 97.3 F (36.3 C) (Temporal)   Ht '5\' 3"'$  (1.6 m)   Wt 118 lb 12.8 oz (53.9 kg)    SpO2 92%   BMI 21.04 kg/m    Wt Readings from Last 3 Encounters:  03/21/22 118 lb 12.8 oz (53.9 kg)  12/13/21 119 lb 9.6 oz (54.3 kg)  09/26/21 119 lb (54 kg)    Physical Exam Vitals and nursing note reviewed.  Constitutional:      General:  She is not in acute distress.    Appearance: She is well-developed and well-groomed. She is not ill-appearing, toxic-appearing or diaphoretic.     Comments: Frail elderly  HENT:     Head: Normocephalic and atraumatic.     Jaw: There is normal jaw occlusion.     Right Ear: Hearing normal.     Left Ear: Hearing normal.     Nose: Nose normal.     Mouth/Throat:     Lips: Pink.     Mouth: Mucous membranes are moist.     Pharynx: Oropharynx is clear. Uvula midline.  Eyes:     General: Lids are normal.     Extraocular Movements: Extraocular movements intact.     Conjunctiva/sclera: Conjunctivae normal.     Pupils: Pupils are equal, round, and reactive to light.  Neck:     Thyroid: No thyroid mass, thyromegaly or thyroid tenderness.     Vascular: Decreased carotid pulses. Carotid bruit present. No JVD.     Trachea: Trachea and phonation normal.  Cardiovascular:     Rate and Rhythm: Normal rate and regular rhythm.     Chest Wall: PMI is not displaced.     Pulses: Normal pulses.     Heart sounds: Normal heart sounds. No murmur heard.    No friction rub. No gallop.  Pulmonary:     Effort: Pulmonary effort is normal. No respiratory distress.     Breath sounds: Normal breath sounds. No wheezing.  Abdominal:     General: There is no abdominal bruit.     Palpations: There is no hepatomegaly or splenomegaly.  Musculoskeletal:        General: Normal range of motion.     Cervical back: Normal range of motion and neck supple.     Right lower leg: No edema.     Left lower leg: No edema.  Lymphadenopathy:     Cervical: No cervical adenopathy.  Skin:    General: Skin is warm and dry.     Capillary Refill: Capillary refill takes less than 2  seconds.     Coloration: Skin is not cyanotic, jaundiced or pale.     Findings: No rash.  Neurological:     General: No focal deficit present.     Mental Status: She is alert and oriented to person, place, and time.     Sensory: Sensation is intact.     Motor: Motor function is intact.     Coordination: Coordination is intact.     Gait: Gait is intact.     Deep Tendon Reflexes: Reflexes are normal and symmetric.  Psychiatric:        Attention and Perception: Attention and perception normal.        Mood and Affect: Mood and affect normal.        Speech: Speech normal.        Behavior: Behavior normal. Behavior is cooperative.        Thought Content: Thought content normal.        Cognition and Memory: Cognition and memory normal.        Judgment: Judgment normal.     Results for orders placed or performed in visit on 12/13/21  CBC with Differential/Platelet  Result Value Ref Range   WBC 7.6 3.4 - 10.8 x10E3/uL   RBC 4.48 3.77 - 5.28 x10E6/uL   Hemoglobin 13.5 11.1 - 15.9 g/dL   Hematocrit 40.1 34.0 - 46.6 %   MCV 90 79 - 97 fL  MCH 30.1 26.6 - 33.0 pg   MCHC 33.7 31.5 - 35.7 g/dL   RDW 12.7 11.7 - 15.4 %   Platelets 236 150 - 450 x10E3/uL   Neutrophils 59 Not Estab. %   Lymphs 30 Not Estab. %   Monocytes 7 Not Estab. %   Eos 3 Not Estab. %   Basos 1 Not Estab. %   Neutrophils Absolute 4.5 1.4 - 7.0 x10E3/uL   Lymphocytes Absolute 2.3 0.7 - 3.1 x10E3/uL   Monocytes Absolute 0.5 0.1 - 0.9 x10E3/uL   EOS (ABSOLUTE) 0.2 0.0 - 0.4 x10E3/uL   Basophils Absolute 0.1 0.0 - 0.2 x10E3/uL   Immature Granulocytes 0 Not Estab. %   Immature Grans (Abs) 0.0 0.0 - 0.1 x10E3/uL  CMP14+EGFR  Result Value Ref Range   Glucose 118 (H) 70 - 99 mg/dL   BUN 15 8 - 27 mg/dL   Creatinine, Ser 1.26 (H) 0.57 - 1.00 mg/dL   eGFR 43 (L) >59 mL/min/1.73   BUN/Creatinine Ratio 12 12 - 28   Sodium 140 134 - 144 mmol/L   Potassium 3.7 3.5 - 5.2 mmol/L   Chloride 98 96 - 106 mmol/L   CO2 24 20 -  29 mmol/L   Calcium 9.8 8.7 - 10.3 mg/dL   Total Protein 6.9 6.0 - 8.5 g/dL   Albumin 4.8 3.8 - 4.8 g/dL   Globulin, Total 2.1 1.5 - 4.5 g/dL   Albumin/Globulin Ratio 2.3 (H) 1.2 - 2.2   Bilirubin Total 0.5 0.0 - 1.2 mg/dL   Alkaline Phosphatase 63 44 - 121 IU/L   AST 32 0 - 40 IU/L   ALT 24 0 - 32 IU/L  Lipid panel  Result Value Ref Range   Cholesterol, Total 175 100 - 199 mg/dL   Triglycerides 174 (H) 0 - 149 mg/dL   HDL 84 >39 mg/dL   VLDL Cholesterol Cal 28 5 - 40 mg/dL   LDL Chol Calc (NIH) 63 0 - 99 mg/dL   Chol/HDL Ratio 2.1 0.0 - 4.4 ratio  Thyroid Panel With TSH  Result Value Ref Range   TSH 1.850 0.450 - 4.500 uIU/mL   T4, Total 10.1 4.5 - 12.0 ug/dL   T3 Uptake Ratio 27 24 - 39 %   Free Thyroxine Index 2.7 1.2 - 4.9  VITAMIN D 25 Hydroxy (Vit-D Deficiency, Fractures)  Result Value Ref Range   Vit D, 25-Hydroxy 39.4 30.0 - 100.0 ng/mL       Pertinent labs & imaging results that were available during my care of the patient were reviewed by me and considered in my medical decision making.  Assessment & Plan:  Kimberlly was seen today for medical management of chronic issues.  Diagnoses and all orders for this visit:  Essential (primary) hypertension BP well controlled. Changes were not made in regimen today. Goal BP is 130/80. Pt aware to report any persistent high or low readings. DASH diet and exercise encouraged. Exercise at least 150 minutes per week and increase as tolerated. Goal BMI > 25. Stress management encouraged. Avoid nicotine and tobacco product use. Avoid excessive alcohol and NSAID's. Avoid more than 2000 mg of sodium daily. Medications as prescribed. Follow up as scheduled.  Follow up with cardiology as scheduled.  -     CMP14+EGFR -     CBC with Differential/Platelet -     Thyroid Panel With TSH  Stage 3b chronic kidney disease ([place]) Followed by nephrology on a regular basis. No changes in urine output.  -  CMP14+EGFR -     CBC with  Differential/Platelet -     VITAMIN D 25 Hydroxy (Vit-D Deficiency, Fractures)  Mixed hyperlipidemia Diet encouraged - increase intake of fresh fruits and vegetables, increase intake of lean proteins. Bake, broil, or grill foods. Avoid fried, greasy, and fatty foods. Avoid fast foods. Increase intake of fiber-rich whole grains. Exercise encouraged - at least 150 minutes per week and advance as tolerated.  Goal BMI < 25. Continue medications as prescribed. Follow up in 3-6 months as discussed.  -     CMP14+EGFR -     Lipid panel  Vitamin D deficiency Labs pending. Continue repletion therapy. If indicated, will change repletion dosage. Eat foods rich in Vit D including milk, orange juice, yogurt with vitamin D added, salmon or mackerel, canned tuna fish, cereals with vitamin D added, and cod liver oil. Get out in the sun but make sure to wear at least SPF 30 sunscreen.  -     CMP14+EGFR -     CBC with Differential/Platelet -     VITAMIN D 25 Hydroxy (Vit-D Deficiency, Fractures)  Age-related osteoporosis without current pathological fracture DEXA completed 11/2021, slight improvement from prior DEXA. Taking Fosamax and tolerating well.  -     CMP14+EGFR -     VITAMIN D 25 Hydroxy (Vit-D Deficiency, Fractures)     Continue all other maintenance medications.  Follow up plan: Return in 6 months (on 09/19/2022), or if symptoms worsen or fail to improve, for chronic follow up.   Continue healthy lifestyle choices, including diet (rich in fruits, vegetables, and lean proteins, and low in salt and simple carbohydrates) and exercise (at least 30 minutes of moderate physical activity daily).  Educational handout given for osteoporosis  The above assessment and management plan was discussed with the patient. The patient verbalized understanding of and has agreed to the management plan. Patient is aware to call the clinic if they develop any new symptoms or if symptoms persist or worsen. Patient is  aware when to return to the clinic for a follow-up visit. Patient educated on when it is appropriate to go to the emergency department.   Monia Pouch, FNP-C [facility] [phone]

## 2022-03-22 LAB — CBC WITH DIFFERENTIAL/PLATELET
Basophils Absolute: 0 10*3/uL (ref 0.0–0.2)
Basos: 1 %
EOS (ABSOLUTE): 0.2 10*3/uL (ref 0.0–0.4)
Eos: 2 %
Hematocrit: 40.9 % (ref 34.0–46.6)
Hemoglobin: 13.7 g/dL (ref 11.1–15.9)
Immature Grans (Abs): 0 10*3/uL (ref 0.0–0.1)
Immature Granulocytes: 0 %
Lymphocytes Absolute: 2.4 10*3/uL (ref 0.7–3.1)
Lymphs: 31 %
MCH: 30.5 pg (ref 26.6–33.0)
MCHC: 33.5 g/dL (ref 31.5–35.7)
MCV: 91 fL (ref 79–97)
Monocytes Absolute: 0.6 10*3/uL (ref 0.1–0.9)
Monocytes: 7 %
Neutrophils Absolute: 4.5 10*3/uL (ref 1.4–7.0)
Neutrophils: 59 %
Platelets: 219 10*3/uL (ref 150–450)
RBC: 4.49 x10E6/uL (ref 3.77–5.28)
RDW: 12.8 % (ref 11.7–15.4)
WBC: 7.7 10*3/uL (ref 3.4–10.8)

## 2022-03-22 LAB — THYROID PANEL WITH TSH
Free Thyroxine Index: 2.7 (ref 1.2–4.9)
T3 Uptake Ratio: 27 % (ref 24–39)
T4, Total: 10 ug/dL (ref 4.5–12.0)
TSH: 1.6 u[IU]/mL (ref 0.450–4.500)

## 2022-03-22 LAB — CMP14+EGFR
ALT: 24 IU/L (ref 0–32)
AST: 32 IU/L (ref 0–40)
Albumin/Globulin Ratio: 2 (ref 1.2–2.2)
Albumin: 4.6 g/dL (ref 3.8–4.8)
Alkaline Phosphatase: 70 IU/L (ref 44–121)
BUN/Creatinine Ratio: 12 (ref 12–28)
BUN: 13 mg/dL (ref 8–27)
Bilirubin Total: 0.4 mg/dL (ref 0.0–1.2)
CO2: 24 mmol/L (ref 20–29)
Calcium: 9.3 mg/dL (ref 8.7–10.3)
Chloride: 99 mmol/L (ref 96–106)
Creatinine, Ser: 1.05 mg/dL — ABNORMAL HIGH (ref 0.57–1.00)
Globulin, Total: 2.3 g/dL (ref 1.5–4.5)
Glucose: 103 mg/dL — ABNORMAL HIGH (ref 70–99)
Potassium: 3.9 mmol/L (ref 3.5–5.2)
Sodium: 140 mmol/L (ref 134–144)
Total Protein: 6.9 g/dL (ref 6.0–8.5)
eGFR: 54 mL/min/{1.73_m2} — ABNORMAL LOW (ref >59)

## 2022-03-22 LAB — VITAMIN D 25 HYDROXY (VIT D DEFICIENCY, FRACTURES): Vit D, 25-Hydroxy: 33.7 ng/mL (ref 30.0–100.0)

## 2022-03-22 LAB — LIPID PANEL
Chol/HDL Ratio: 2 ratio (ref 0.0–4.4)
Cholesterol, Total: 162 mg/dL (ref 100–199)
HDL: 82 mg/dL (ref >39)
LDL Chol Calc (NIH): 51 mg/dL (ref 0–99)
Triglycerides: 181 mg/dL — ABNORMAL HIGH (ref 0–149)
VLDL Cholesterol Cal: 29 mg/dL (ref 5–40)

## 2022-04-05 ENCOUNTER — Other Ambulatory Visit: Payer: Self-pay | Admitting: Family Medicine

## 2022-04-05 DIAGNOSIS — K219 Gastro-esophageal reflux disease without esophagitis: Secondary | ICD-10-CM

## 2022-04-05 DIAGNOSIS — R110 Nausea: Secondary | ICD-10-CM

## 2022-04-09 NOTE — Progress Notes (Unsigned)
Cardiology Office Note  Date: 04/10/2022   ID: [name], [name] 11/25/1940, MRN [id number]  History of Present Illness: Tamara King is a 82 y.o. female last seen in September 2023.  She is here for a routine visit.  Reports occasional and unpredictable episodes of palpitations associated with chest discomfort.  No dizziness or syncope in association with the symptoms.  Last episode was about 2 weeks ago.  I reviewed her medications, she reports compliance with therapy.  No spontaneous bleeding problems on Plavix.  She has done well on Crestor 40 mg daily, recent LDL 51.  She is due for follow-up carotid Dopplers next month.  Physical Exam: VS:  BP 128/64   Pulse 63   Ht 5\' 3"  (1.6 m)   Wt 118 lb 12.8 oz (53.9 kg)   SpO2 97%   BMI 21.04 kg/m , BMI Body mass index is 21.04 kg/m.  Wt Readings from Last 3 Encounters:  04/10/22 118 lb 12.8 oz (53.9 kg)  03/21/22 118 lb 12.8 oz (53.9 kg)  12/13/21 119 lb 9.6 oz (54.3 kg)    General: Patient appears comfortable at rest. HEENT: Conjunctiva and lids normal,. Neck: Supple, no elevated JVP, bilateral carotid bruits. Lungs: Clear to auscultation, nonlabored breathing at rest. Cardiac: Regular rate and rhythm, no S3, 2/6 systolic murmur. Extremities: No pitting edema.  ECG:  An ECG dated 09/26/2021 was personally reviewed today and demonstrated:  Sinus rhythm with prolonged PR interval and left bundle branch block.  Labwork: 03/21/2022: ALT 24; AST 32; BUN 13; Creatinine, Ser 1.05; Hemoglobin 13.7; Platelets 219; Potassium 3.9; Sodium 140; TSH 1.600     Component Value Date/Time   CHOL 162 03/21/2022 1006   TRIG 181 (H) 03/21/2022 1006   HDL 82 03/21/2022 1006   CHOLHDL 2.0 03/21/2022 1006   LDLCALC 51 03/21/2022 1006   Other Studies Reviewed Today:  Echocardiogram 04/18/2021:  1. Left ventricular ejection fraction, by estimation, is 60 to 65%. The  left ventricle has normal function. The left ventricle has no regional   wall motion abnormalities. Left ventricular diastolic parameters are  consistent with Grade I diastolic  dysfunction (impaired relaxation). Elevated left atrial pressure. The  average left ventricular global longitudinal strain is -16.2 %. The global  longitudinal strain is normal.   2. Right ventricular systolic function is normal. The right ventricular  size is normal. There is normal pulmonary artery systolic pressure.   3. The mitral valve is abnormal. Mild mitral valve regurgitation. No  evidence of mitral stenosis.   4. The tricuspid valve is abnormal.   5. The aortic valve has an indeterminant number of cusps. Aortic valve  regurgitation is mild. No aortic stenosis is present.   6. The inferior vena cava is normal in size with greater than 50%  respiratory variability, suggesting right atrial pressure of 3 mmHg.   Assessment and Plan:  1.  Intermittent palpitations associated with chest discomfort as discussed above.  No dizziness or syncope.  We discussed further investigation with cardiac monitor, specifically Zio patch although she wanted to hold off for now.  Main concern would be a paroxysmal arrhythmia such as atrial fibrillation.  I have asked her to let us know if symptoms increase and if she changes her mind about cardiac monitoring.  1.  Bilateral carotid artery disease with known occlusion of the LICA and mild RICA stenosis by carotid Dopplers in March 2023.  She is due for carotid Dopplers next month.  Continues on  Plavix and Crestor.  2.  Bilateral renal artery stenosis status post previous stent intervention and angioplasty due to in-stent restenosis.  She follows with Dr. Fletcher Anon.  Renal arterial Dopplers in December 2023 showed no evidence of in-stent restenosis.  3.  Essential hypertension.  Blood pressure is well-controlled today.  4.  History of nonobstructive coronary atherosclerosis as of 2003.  She remains on Crestor with LDL 51 in February of this  year.  Disposition:  Follow up  6 months.  Signed, Satira Sark, M.D., F.A.C.C.

## 2022-04-10 ENCOUNTER — Ambulatory Visit: Payer: Medicare Other | Attending: Cardiology | Admitting: Cardiology

## 2022-04-10 ENCOUNTER — Encounter: Payer: Self-pay | Admitting: Cardiology

## 2022-04-10 VITALS — BP 128/64 | HR 63 | Ht 63.0 in | Wt 118.8 lb

## 2022-04-10 DIAGNOSIS — I6523 Occlusion and stenosis of bilateral carotid arteries: Secondary | ICD-10-CM | POA: Diagnosis not present

## 2022-04-10 DIAGNOSIS — E782 Mixed hyperlipidemia: Secondary | ICD-10-CM | POA: Diagnosis not present

## 2022-04-10 DIAGNOSIS — I10 Essential (primary) hypertension: Secondary | ICD-10-CM | POA: Diagnosis not present

## 2022-04-10 DIAGNOSIS — R002 Palpitations: Secondary | ICD-10-CM

## 2022-04-10 NOTE — Patient Instructions (Addendum)

## 2022-04-30 ENCOUNTER — Other Ambulatory Visit: Payer: Self-pay | Admitting: *Deleted

## 2022-04-30 ENCOUNTER — Other Ambulatory Visit: Payer: Self-pay | Admitting: Family Medicine

## 2022-04-30 DIAGNOSIS — I10 Essential (primary) hypertension: Secondary | ICD-10-CM

## 2022-04-30 MED ORDER — ALENDRONATE SODIUM 70 MG PO TABS: ORAL_TABLET | ORAL | 0 refills | Status: DC

## 2022-05-03 ENCOUNTER — Ambulatory Visit: Payer: Medicare Other | Attending: Cardiology

## 2022-05-03 DIAGNOSIS — I6523 Occlusion and stenosis of bilateral carotid arteries: Secondary | ICD-10-CM | POA: Diagnosis not present

## 2022-05-09 ENCOUNTER — Other Ambulatory Visit: Payer: Self-pay | Admitting: Family Medicine

## 2022-05-09 DIAGNOSIS — I10 Essential (primary) hypertension: Secondary | ICD-10-CM

## 2022-05-16 ENCOUNTER — Other Ambulatory Visit: Payer: Self-pay | Admitting: Family Medicine

## 2022-05-16 DIAGNOSIS — R600 Localized edema: Secondary | ICD-10-CM

## 2022-05-18 ENCOUNTER — Other Ambulatory Visit: Payer: Self-pay | Admitting: Family Medicine

## 2022-05-18 DIAGNOSIS — E785 Hyperlipidemia, unspecified: Secondary | ICD-10-CM

## 2022-05-18 DIAGNOSIS — I701 Atherosclerosis of renal artery: Secondary | ICD-10-CM

## 2022-05-18 DIAGNOSIS — Z8673 Personal history of transient ischemic attack (TIA), and cerebral infarction without residual deficits: Secondary | ICD-10-CM

## 2022-05-18 DIAGNOSIS — I6522 Occlusion and stenosis of left carotid artery: Secondary | ICD-10-CM

## 2022-06-05 DIAGNOSIS — N280 Ischemia and infarction of kidney: Secondary | ICD-10-CM | POA: Diagnosis not present

## 2022-06-05 DIAGNOSIS — N1831 Chronic kidney disease, stage 3a: Secondary | ICD-10-CM | POA: Diagnosis not present

## 2022-06-05 DIAGNOSIS — I5032 Chronic diastolic (congestive) heart failure: Secondary | ICD-10-CM | POA: Diagnosis not present

## 2022-06-05 DIAGNOSIS — R809 Proteinuria, unspecified: Secondary | ICD-10-CM | POA: Diagnosis not present

## 2022-06-08 IMAGING — DX DG WRIST COMPLETE 3+V*R*
3 series · 3 of 3 positions shown · non-contrast
Comparison: None.

CLINICAL DATA: Right wrist pain.

EXAM:
RIGHT WRIST - COMPLETE 3+ VIEW

[wrist ap]
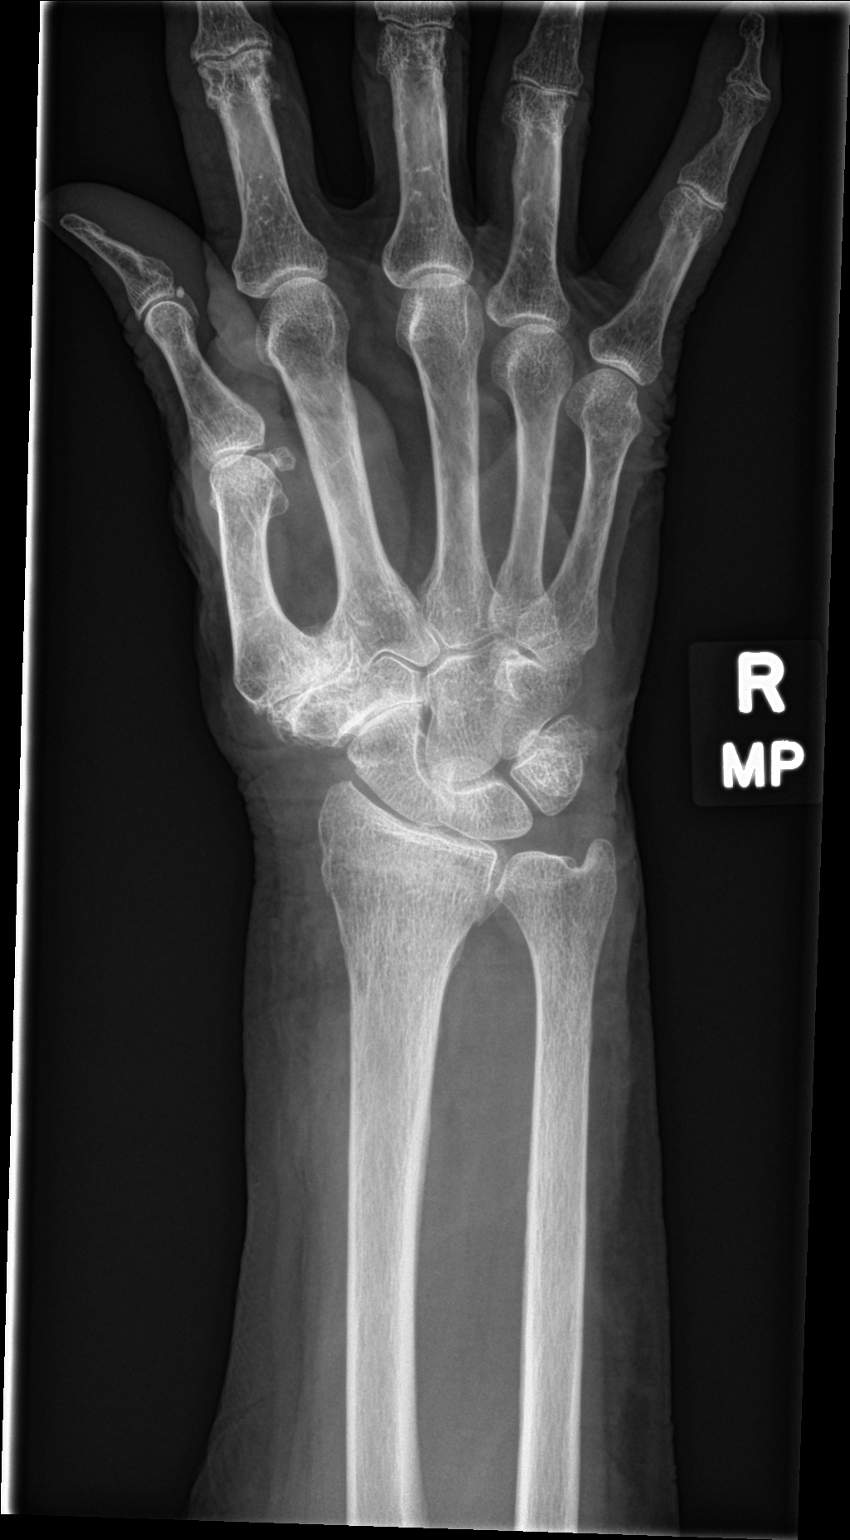

[wrist obl]
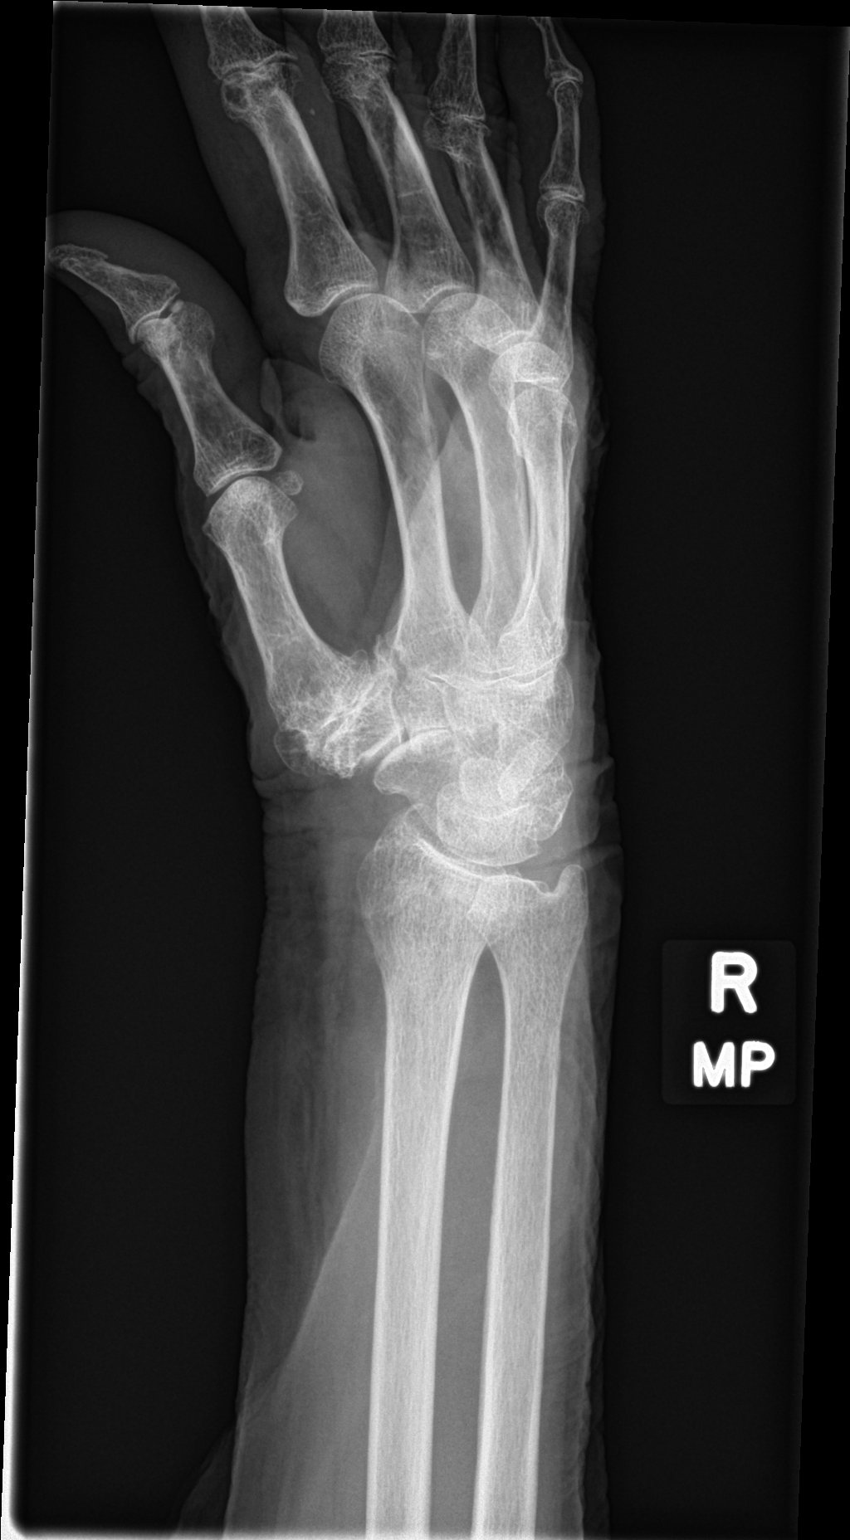

[wrist lat]
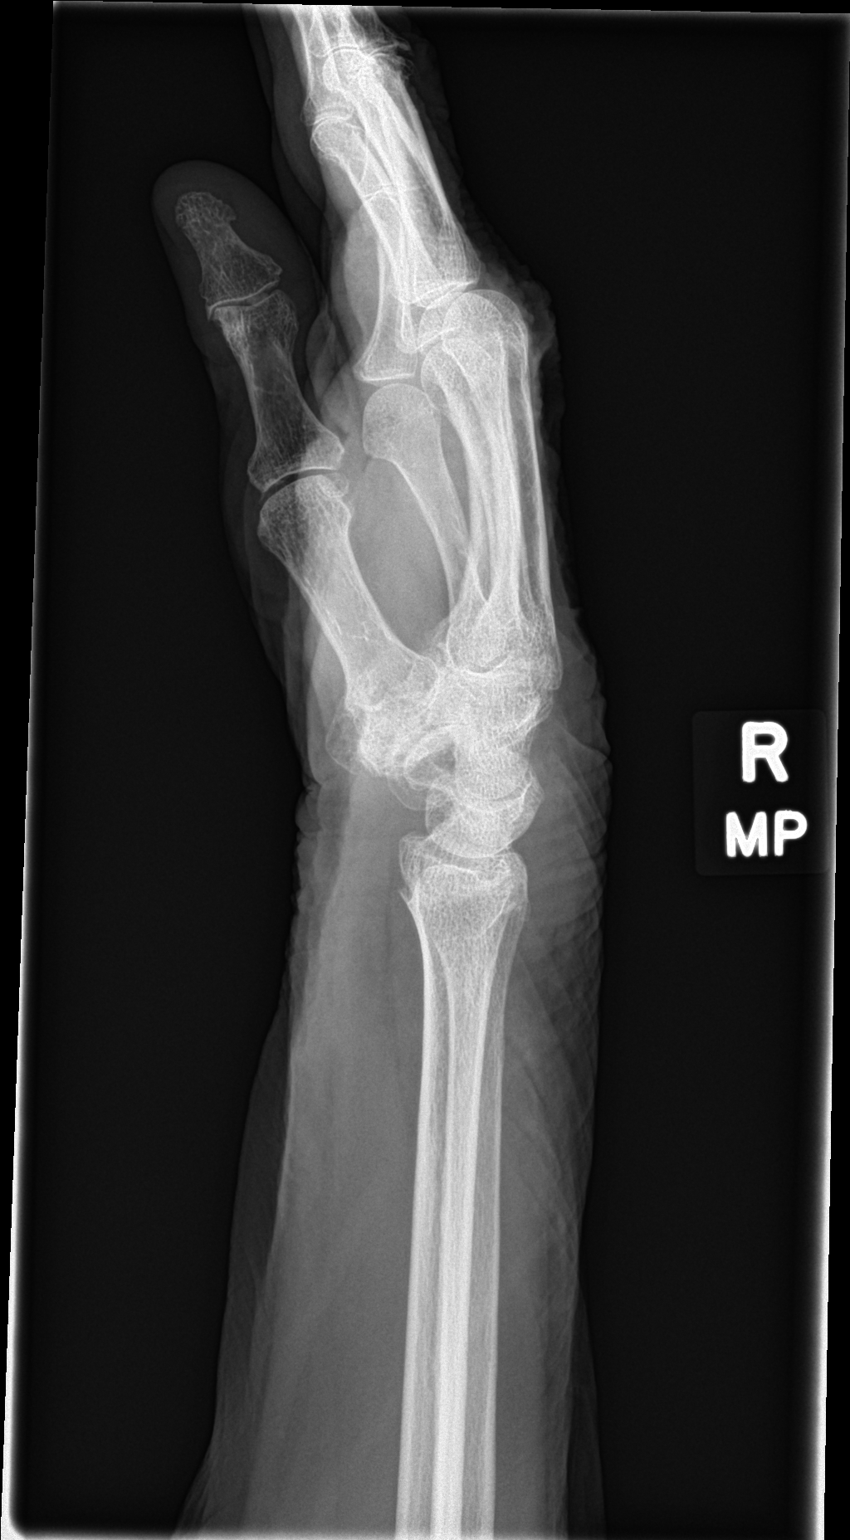

[3 of 3 positions shown; findings below may reference images not displayed]

FINDINGS: Mildly displaced distal right radial fracture is noted. Severe
degenerative changes seen involving the first carpometacarpal joint.
Soft tissues are unremarkable.
IMPRESSION: Mildly displaced distal right radial fracture. Severe osteoarthritis
involving the first carpometacarpal joint.

## 2022-06-11 ENCOUNTER — Other Ambulatory Visit: Payer: Self-pay | Admitting: Cardiology

## 2022-06-11 DIAGNOSIS — I6523 Occlusion and stenosis of bilateral carotid arteries: Secondary | ICD-10-CM

## 2022-06-29 ENCOUNTER — Ambulatory Visit (INDEPENDENT_AMBULATORY_CARE_PROVIDER_SITE_OTHER): Payer: Medicare Other

## 2022-06-29 VITALS — Ht 63.0 in | Wt 111.0 lb

## 2022-06-29 DIAGNOSIS — Z0000 Encounter for general adult medical examination without abnormal findings: Secondary | ICD-10-CM | POA: Diagnosis not present

## 2022-06-29 NOTE — Progress Notes (Signed)
Subjective:   Tamara King is a 82 y.o. female who presents for Medicare Annual (Subsequent) preventive examination. I connected with  JAYLIAH EMMITT on 06/29/22 by a audio enabled telemedicine application and verified that I am speaking with the correct person using two identifiers.  Patient Location: Home  Provider Location: Home Office  I discussed the limitations of evaluation and management by telemedicine. The patient expressed understanding and agreed to proceed.  Review of Systems     Cardiac Risk Factors include: advanced age (>62men, >52 women);hypertension     Objective:    Today's Vitals   06/29/22 0821  Weight: 111 lb (50.3 kg)  Height: 5\' 3"  (1.6 m)   Body mass index is 19.66 kg/m.     06/29/2022    8:24 AM 06/27/2021    8:27 AM 06/24/2020    8:11 AM 06/24/2019    8:48 AM 05/20/2019    8:20 AM 11/28/2017   12:00 AM 11/27/2017    9:46 AM  Advanced Directives  Does Patient Have a Medical Advance Directive? Yes No No No No No No  Type of Estate agent of [place];Living will        Copy of Healthcare Power of Attorney in Chart? No - copy requested        Would patient like information on creating a medical advance directive?  No - Patient declined No - Patient declined No - Patient declined No - Patient declined  No - Patient declined    Current Medications (verified) Outpatient Encounter Medications as of 06/29/2022  Medication Sig   acetaminophen (TYLENOL) 500 MG tablet Take 500 mg by mouth every 6 (six) hours as needed for moderate pain.   albuterol (VENTOLIN HFA) 108 (90 Base) MCG/ACT inhaler INHALE 2 PUFFS BY MOUTH EVERY 6 HOURS AS NEEDED FOR WHEEZING AND FOR SHORTNESS OF BREATH   alendronate (FOSAMAX) 70 MG tablet TAKE 1 TABLET BY MOUTH ONCE A WEEK WITH A FULL GLASS OF WATER ON AN EMPTY STOMACH   amLODipine (NORVASC) 5 MG tablet Take 1 tablet by mouth once daily   carvedilol (COREG) 25 MG tablet Take 1 tablet by mouth twice daily    cetirizine (ZYRTEC) 10 MG tablet Take 1 tablet by mouth once daily   Cholecalciferol (VITAMIN D3) 25 MCG (1000 UT) CAPS Take 1 capsule by mouth daily.   clopidogrel (PLAVIX) 75 MG tablet Take 1 tablet (75 mg total) by mouth daily.   famotidine (PEPCID) 20 MG tablet Take 1 tablet (20 mg total) by mouth daily.   fluticasone (FLONASE) 50 MCG/ACT nasal spray Place 2 sprays into the nose daily as needed for allergies (congestion.).    furosemide (LASIX) 40 MG tablet Take 1 tablet by mouth twice daily   hydrALAZINE (APRESOLINE) 10 MG tablet Take 2 tablets (20 mg total) by mouth 3 (three) times daily.   pantoprazole (PROTONIX) 40 MG tablet Take 1 tablet by mouth once daily   potassium chloride SA (KLOR-CON M) 20 MEQ tablet Take 20 mEq by mouth 2 (two) times daily.   rosuvastatin (CRESTOR) 40 MG tablet Take 1 tablet by mouth once daily   No facility-administered encounter medications on file as of 06/29/2022.    Allergies (verified) Losartan, Sulfa antibiotics, Codeine, Fish allergy, Other, Spironolactone, Vancomycin, and Penicillins   History: Past Medical History:  Diagnosis Date   Breast cancer (HCC)    Adenocarcinoma   Chronic edema    CKD (chronic kidney disease) stage 3, GFR 30-59 ml/min (HCC)  10/22/2018   Coronary atherosclerosis of native coronary artery    Nonobstructive at catheterization 2003, LVEF normal    Essential hypertension    GERD (gastroesophageal reflux disease) 07/06/2013   Idiopathic peripheral neuropathy 10/06/2013   Mixed hyperlipidemia    Occlusion and stenosis of left carotid artery 10/22/2017   Renal artery stenosis (HCC)    BMS bilateally 2003, PTCA bilaterally 2005 with restenosis   Sleep apnea    Stop Bang score of 4   Stroke [facility])    Symptomatic varicose veins, right 10/16/2018   Past Surgical History:  Procedure Laterality Date   APPENDECTOMY     CATARACT EXTRACTION W/PHACO  03/29/2011   Procedure: CATARACT EXTRACTION PHACO AND INTRAOCULAR LENS PLACEMENT  (IOC);  Surgeon: Gemma Payor, MD;  Location: [facility];  Service: Ophthalmology;  Laterality: Right;  CDE:12.82   CATARACT EXTRACTION W/PHACO  04/16/2011   Procedure: CATARACT EXTRACTION PHACO AND INTRAOCULAR LENS PLACEMENT (IOC);  Surgeon: Gemma Payor, MD;  Location: [facility];  Service: Ophthalmology;  Laterality: Left;  CDE: 11.54   CESAREAN SECTION     x2   CHOLECYSTECTOMY  2018   HEMORRHOID SURGERY     RENAL ANGIOGRAPHY N/A 05/20/2019   Procedure: RENAL ANGIOGRAPHY;  Surgeon: Iran Ouch, MD;  Location: [facility];  Service: Cardiovascular;  Laterality: N/A;   RENAL INTERVENTION  05/20/2019   Procedure: RENAL INTERVENTION;  Surgeon: Iran Ouch, MD;  Location: [facility];  Service: Cardiovascular;;   Right breast lumpectomy     ROTATOR CUFF REPAIR  2008   UPPER EXTREMITY ANGIOGRAPHY N/A 11/27/2017   Procedure: UPPER EXTREMITY ANGIOGRAPHY;  Surgeon: Cephus Shelling, MD;  Location: [facility];  Service: Cardiovascular;  Laterality: N/A;   Family History  Problem Relation Age of Onset   Heart failure Mother    Stroke Mother 51   Lung cancer Father    Diabetes Father    Diabetes Sister    Brain cancer Sister    COPD Sister    Brain cancer Brother    Diabetes Brother    Diabetes Brother    Diabetes Son    Thyroid cancer Son    Cancer Son    Diabetes Son    Anesthesia problems Neg Hx    Hypotension Neg Hx    Malignant hyperthermia Neg Hx    Pseudochol deficiency Neg Hx    Social History   Socioeconomic History   Marital status: Widowed    Spouse name: Not on file   Number of children: 2   Years of education: 12th   Highest education level: 12th grade  Occupational History    Employer: RETIRED    Comment: n/a  Tobacco Use   Smoking status: Former    Packs/day: 1.00    Years: 35.00    Additional pack years: 0.00    Total pack years: 35.00    Types: Cigarettes    Quit date: 01/22/1998    Years since quitting: 24.4   Smokeless tobacco:  Never   Tobacco comments:    tobacco use - no  Vaping Use   Vaping Use: Never used  Substance and Sexual Activity   Alcohol use: No   Drug use: No   Sexual activity: Not on file  Other Topics Concern   Not on file  Social History Narrative   Patient lives at home with family.  Two sons and two granddaughters live with her.   Caffeine use; 2-5 cups daily  Oldest son has cancer   Social Determinants of Health   Financial Resource Strain: Low Risk  (06/29/2022)   Overall Financial Resource Strain (CARDIA)    Difficulty of Paying Living Expenses: Not hard at all  Food Insecurity: No Food Insecurity (06/29/2022)   Hunger Vital Sign    Worried About Running Out of Food in the Last Year: Never true    Ran Out of Food in the Last Year: Never true  Transportation Needs: No Transportation Needs (06/29/2022)   PRAPARE - Administrator, Civil Service (Medical): No    Lack of Transportation (Non-Medical): No  Physical Activity: Insufficiently Active (06/29/2022)   Exercise Vital Sign    Days of Exercise per Week: 3 days    Minutes of Exercise per Session: 30 min  Stress: No Stress Concern Present (06/29/2022)   [employer] of Occupational Health - Occupational Stress Questionnaire    Feeling of Stress : Not at all  Social Connections: Socially Isolated (06/29/2022)   Social Connection and Isolation Panel [NHANES]    Frequency of Communication with Friends and Family: More than three times a week    Frequency of Social Gatherings with Friends and Family: More than three times a week    Attends Religious Services: Never    Database administrator or Organizations: No    Attends Banker Meetings: Never    Marital Status: Widowed    Tobacco Counseling Counseling given: Not Answered Tobacco comments: tobacco use - no   Clinical Intake:  Pre-visit preparation completed: Yes  Pain : No/denies pain     Nutritional Risks: None Diabetes: No  How often do you  need to have someone help you when you read instructions, pamphlets, or other written materials from your doctor or pharmacy?: 1 - Never  Diabetic?no   Interpreter Needed?: No  Information entered by :: Renie Ora, LPN   Activities of Daily Living    06/29/2022    8:24 AM  In your present state of health, do you have any difficulty performing the following activities:  Hearing? 0  Vision? 0  Difficulty concentrating or making decisions? 0  Walking or climbing stairs? 0  Dressing or bathing? 0  Doing errands, shopping? 0  Preparing Food and eating ? N  Using the Toilet? N  In the past six months, have you accidently leaked urine? N  Do you have problems with loss of bowel control? N  Managing your Medications? N  Managing your Finances? N  Housekeeping or managing your Housekeeping? N    Patient Care Team: Sonny Masters, FNP as PCP - General (Family Medicine) Jonelle Sidle, MD as PCP - Cardiology (Cardiology) Randa Lynn, MD as Consulting Physician (Nephrology) Iran Ouch, MD as Consulting Physician (Cardiology) Danella Maiers, [facility] as [facility] Care Management (Pharmacist) Iran Ouch, MD as Consulting Physician (Cardiology) Delora Fuel, OD (Optometry)  Indicate any recent Medical Services you may have received from other than [facility] providers in the past year (date may be approximate).     Assessment:   This is a routine wellness examination for [place].  Hearing/Vision screen Vision Screening - Comments:: Wears rx glasses - up to date with routine eye exams with  Dr.Johnson   Dietary issues and exercise activities discussed: Current Exercise Habits: Home exercise routine, Type of exercise: walking, Time (Minutes): 30, Frequency (Times/Week): 3, Weekly Exercise (Minutes/Week): 90, Intensity: Mild, Exercise limited by: None identified   Goals Addressed  This Visit's Progress    DIET - INCREASE WATER INTAKE          Depression Screen    06/29/2022    8:23 AM 03/21/2022    9:55 AM 06/27/2021    8:25 AM 06/15/2021   10:10 AM 03/29/2021    8:53 AM 11/15/2020   10:37 AM 08/11/2020   10:21 AM  PHQ 2/9 Scores  PHQ - 2 Score 0 0 2  0 0 0  PHQ- 9 Score  0 6   0 0  Exception Documentation    Patient refusal       Fall Risk    06/29/2022    8:22 AM 03/21/2022    9:55 AM 06/27/2021    8:20 AM 06/15/2021   10:10 AM 05/11/2021    8:27 AM  Fall Risk   Falls in the past year? 0 0 1 0 1  Number falls in past yr: 0  0 0 0  Injury with Fall? 0  1 1 1   Risk for fall due to : No Fall Risks  History of fall(s);Orthopedic patient  History of fall(s)  Follow up Falls prevention discussed  Education provided;Falls prevention discussed Falls prevention discussed Education provided    FALL RISK PREVENTION PERTAINING TO THE HOME:  Any stairs in or around the home? No  If so, are there any without handrails? No  Home free of loose throw rugs in walkways, pet beds, electrical cords, etc? Yes  Adequate lighting in your home to reduce risk of falls? Yes   ASSISTIVE DEVICES UTILIZED TO PREVENT FALLS:  Life alert? No  Use of a cane, walker or w/c? Yes  Grab bars in the bathroom? Yes  Shower chair or bench in shower? Yes  Elevated toilet seat or a handicapped toilet? No          06/29/2022    8:25 AM 06/27/2021    8:29 AM 06/24/2020    8:13 AM 06/24/2019    8:56 AM  6CIT Screen  What Year? 0 points 0 points 0 points 0 points  What month? 0 points 0 points 0 points 0 points  What time? 0 points 0 points 0 points 0 points  Count back from 20 0 points 0 points 0 points 0 points  Months in reverse 0 points 0 points 0 points 0 points  Repeat phrase 0 points 2 points 0 points 0 points  Total Score 0 points 2 points 0 points 0 points    Immunizations Immunization History  Administered Date(s) Administered   Fluad Quad(high Dose 65+) 11/11/2015, 12/05/2015, 10/20/2019, 11/15/2020, 12/13/2021   Influenza, High Dose  Seasonal PF 12/07/2013, 11/01/2014, 11/11/2015, 12/05/2015, 10/23/2016, 10/07/2018   Influenza,trivalent, recombinat, inj, PF 12/07/2013, 10/23/2016   Moderna Sars-Covid-2 Vaccination 04/20/2019, 05/18/2019, 01/13/2020   Pneumococcal Conjugate-13 01/14/2019   Pneumococcal Polysaccharide-23 10/22/2013   Tdap 07/06/2013, 02/01/2017   Zoster Recombinat (Shingrix) 02/15/2021    TDAP status: Up to date  Flu Vaccine status: Up to date  Pneumococcal vaccine status: Up to date  Covid-19 vaccine status: Completed vaccines  Qualifies for Shingles Vaccine? Yes   Zostavax completed Yes   Shingrix Completed?: Yes  Screening Tests Health Maintenance  Topic Date Due   Zoster Vaccines- Shingrix (2 of 2) 04/12/2021   COVID-19 Vaccine (4 - 2023-24 season) 09/22/2021   MAMMOGRAM  03/03/2022   INFLUENZA VACCINE  08/23/2022   Medicare Annual Wellness (AWV)  06/29/2023   DEXA SCAN  12/14/2023   DTaP/Tdap/Td (3 - Td or  Tdap) 02/02/2027   Pneumonia Vaccine 62+ Years old  Completed   Hepatitis C Screening  Completed   HPV VACCINES  Aged Out   Colonoscopy  Discontinued    Health Maintenance  Health Maintenance Due  Topic Date Due   Zoster Vaccines- Shingrix (2 of 2) 04/12/2021   COVID-19 Vaccine (4 - 2023-24 season) 09/22/2021   MAMMOGRAM  03/03/2022    Colorectal cancer screening: No longer required.   Mammogram status: No longer required due to age .  Bone Density status: Completed 12/13/2021. Results reflect: Bone density results: OSTEOPOROSIS. Repeat every 2 years.  Lung Cancer Screening: (Low Dose CT Chest recommended if Age 62-80 years, 30 pack-year currently smoking OR have quit w/in 15years.) does not qualify.   Lung Cancer Screening Referral: n/a  Additional Screening:  Hepatitis C Screening: does not qualify; Completed 04/08/2019  Vision Screening: Recommended annual ophthalmology exams for early detection of glaucoma and other disorders of the eye. Is the patient up to  date with their annual eye exam?  Yes  Who is the provider or what is the name of the office in which the patient attends annual eye exams? Dr.Johnson  If pt is not established with a provider, would they like to be referred to a provider to establish care? No .   Dental Screening: Recommended annual dental exams for proper oral hygiene  Community Resource Referral / Chronic Care Management: CRR required this visit?  No   CCM required this visit?  No      Plan:     I have personally reviewed and noted the following in the patient's chart:   Medical and social history Use of alcohol, tobacco or illicit drugs  Current medications and supplements including opioid prescriptions. Patient is not currently taking opioid prescriptions. Functional ability and status Nutritional status Physical activity Advanced directives List of other physicians Hospitalizations, surgeries, and ER visits in previous 12 months Vitals Screenings to include cognitive, depression, and falls Referrals and appointments  In addition, I have reviewed and discussed with patient certain preventive protocols, quality metrics, and best practice recommendations. A written personalized care plan for preventive services as well as general preventive health recommendations were provided to patient.     Lorrene Reid, LPN   01/27/1094   Nurse Notes: none

## 2022-06-29 NOTE — Patient Instructions (Signed)
Tamara King , Thank you for taking time to come for your Medicare Wellness Visit. I appreciate your ongoing commitment to your health goals. Please review the following plan we discussed and let me know if I can assist you in the future.   These are the goals we discussed:  Goals      DIET - INCREASE WATER INTAKE     Exercise 3x per week (30 min per time)     Patient Stated     06/27/2021 AWV Goal: Keep All Scheduled Appointments  Over the next year, patient will attend all scheduled appointments with their PCP and any specialists that they see.           This is a list of the screening recommended for you and due dates:  Health Maintenance  Topic Date Due   Zoster (Shingles) Vaccine (2 of 2) 04/12/2021   COVID-19 Vaccine (4 - 2023-24 season) 09/22/2021   Mammogram  03/03/2022   Flu Shot  08/23/2022   Medicare Annual Wellness Visit  06/29/2023   DEXA scan (bone density measurement)  12/14/2023   DTaP/Tdap/Td vaccine (3 - Td or Tdap) 02/02/2027   Pneumonia Vaccine  Completed   Hepatitis C Screening  Completed   HPV Vaccine  Aged Out   Colon Cancer Screening  Discontinued    Advanced directives: Please bring a copy of your health care power of attorney and living will to the office to be added to your chart at your convenience.   Conditions/risks identified: Aim for 30 minutes of exercise or brisk walking, 6-8 glasses of water, and 5 servings of fruits and vegetables each day.   Next appointment: Follow up in one year for your annual wellness visit    Preventive Care 65 Years and Older, Female Preventive care refers to lifestyle choices and visits with your health care provider that can promote health and wellness. What does preventive care include? A yearly physical exam. This is also called an annual well check. Dental exams once or twice a year. Routine eye exams. Ask your health care provider how often you should have your eyes checked. Personal lifestyle choices,  including: Daily care of your teeth and gums. Regular physical activity. Eating a healthy diet. Avoiding tobacco and drug use. Limiting alcohol use. Practicing safe sex. Taking low-dose aspirin every day. Taking vitamin and mineral supplements as recommended by your health care provider. What happens during an annual well check? The services and screenings done by your health care provider during your annual well check will depend on your age, overall health, lifestyle risk factors, and family history of disease. Counseling  Your health care provider may ask you questions about your: Alcohol use. Tobacco use. Drug use. Emotional well-being. Home and relationship well-being. Sexual activity. Eating habits. History of falls. Memory and ability to understand (cognition). Work and work Astronomer. Reproductive health. Screening  You may have the following tests or measurements: Height, weight, and BMI. Blood pressure. Lipid and cholesterol levels. These may be checked every 5 years, or more frequently if you are over 65 years old. Skin check. Lung cancer screening. You may have this screening every year starting at age 78 if you have a 30-pack-year history of smoking and currently smoke or have quit within the past 15 years. Fecal occult blood test (FOBT) of the stool. You may have this test every year starting at age 4. Flexible sigmoidoscopy or colonoscopy. You may have a sigmoidoscopy every 5 years or a colonoscopy every 10  years starting at age 38. Hepatitis C blood test. Hepatitis B blood test. Sexually transmitted disease (STD) testing. Diabetes screening. This is done by checking your blood sugar (glucose) after you have not eaten for a while (fasting). You may have this done every 1-3 years. Bone density scan. This is done to screen for osteoporosis. You may have this done starting at age 35. Mammogram. This may be done every 1-2 years. Talk to your health care provider  about how often you should have regular mammograms. Talk with your health care provider about your test results, treatment options, and if necessary, the need for more tests. Vaccines  Your health care provider may recommend certain vaccines, such as: Influenza vaccine. This is recommended every year. Tetanus, diphtheria, and acellular pertussis (Tdap, Td) vaccine. You may need a Td booster every 10 years. Zoster vaccine. You may need this after age 73. Pneumococcal 13-valent conjugate (PCV13) vaccine. One dose is recommended after age 46. Pneumococcal polysaccharide (PPSV23) vaccine. One dose is recommended after age 42. Talk to your health care provider about which screenings and vaccines you need and how often you need them. This information is not intended to replace advice given to you by your health care provider. Make sure you discuss any questions you have with your health care provider. Document Released: 02/04/2015 Document Revised: 09/28/2015 Document Reviewed: 11/09/2014 Elsevier Interactive Patient Education  2017 [place] Prevention in the Home Falls can cause injuries. They can happen to people of all ages. There are many things you can do to make your home safe and to help prevent falls. What can I do on the outside of my home? Regularly fix the edges of walkways and driveways and fix any cracks. Remove anything that might make you trip as you walk through a door, such as a raised step or threshold. Trim any bushes or trees on the path to your home. Use bright outdoor lighting. Clear any walking paths of anything that might make someone trip, such as rocks or tools. Regularly check to see if handrails are loose or broken. Make sure that both sides of any steps have handrails. Any raised decks and porches should have guardrails on the edges. Have any leaves, snow, or ice cleared regularly. Use sand or salt on walking paths during winter. Clean up any spills in  your garage right away. This includes oil or grease spills. What can I do in the bathroom? Use night lights. Install grab bars by the toilet and in the tub and shower. Do not use towel bars as grab bars. Use non-skid mats or decals in the tub or shower. If you need to sit down in the shower, use a plastic, non-slip stool. Keep the floor dry. Clean up any water that spills on the floor as soon as it happens. Remove soap buildup in the tub or shower regularly. Attach bath mats securely with double-sided non-slip rug tape. Do not have throw rugs and other things on the floor that can make you trip. What can I do in the bedroom? Use night lights. Make sure that you have a light by your bed that is easy to reach. Do not use any sheets or blankets that are too big for your bed. They should not hang down onto the floor. Have a firm chair that has side arms. You can use this for support while you get dressed. Do not have throw rugs and other things on the floor that can make you trip.  What can I do in the kitchen? Clean up any spills right away. Avoid walking on wet floors. Keep items that you use a lot in easy-to-reach places. If you need to reach something above you, use a strong step stool that has a grab bar. Keep electrical cords out of the way. Do not use floor polish or wax that makes floors slippery. If you must use wax, use non-skid floor wax. Do not have throw rugs and other things on the floor that can make you trip. What can I do with my stairs? Do not leave any items on the stairs. Make sure that there are handrails on both sides of the stairs and use them. Fix handrails that are broken or loose. Make sure that handrails are as long as the stairways. Check any carpeting to make sure that it is firmly attached to the stairs. Fix any carpet that is loose or worn. Avoid having throw rugs at the top or bottom of the stairs. If you do have throw rugs, attach them to the floor with carpet  tape. Make sure that you have a light switch at the top of the stairs and the bottom of the stairs. If you do not have them, ask someone to add them for you. What else can I do to help prevent falls? Wear shoes that: Do not have high heels. Have rubber bottoms. Are comfortable and fit you well. Are closed at the toe. Do not wear sandals. If you use a stepladder: Make sure that it is fully opened. Do not climb a closed stepladder. Make sure that both sides of the stepladder are locked into place. Ask someone to hold it for you, if possible. Clearly mark and make sure that you can see: Any grab bars or handrails. First and last steps. Where the edge of each step is. Use tools that help you move around (mobility aids) if they are needed. These include: Canes. Walkers. Scooters. Crutches. Turn on the lights when you go into a dark area. Replace any light bulbs as soon as they burn out. Set up your furniture so you have a clear path. Avoid moving your furniture around. If any of your floors are uneven, fix them. If there are any pets around you, be aware of where they are. Review your medicines with your doctor. Some medicines can make you feel dizzy. This can increase your chance of falling. Ask your doctor what other things that you can do to help prevent falls. This information is not intended to replace advice given to you by your health care provider. Make sure you discuss any questions you have with your health care provider. Document Released: 11/04/2008 Document Revised: 06/16/2015 Document Reviewed: 02/12/2014 Elsevier Interactive Patient Education  2017 [employer].

## 2022-07-04 ENCOUNTER — Other Ambulatory Visit: Payer: Self-pay | Admitting: Family Medicine

## 2022-07-04 DIAGNOSIS — R110 Nausea: Secondary | ICD-10-CM

## 2022-07-10 ENCOUNTER — Other Ambulatory Visit: Payer: Self-pay | Admitting: Family Medicine

## 2022-07-10 DIAGNOSIS — I6522 Occlusion and stenosis of left carotid artery: Secondary | ICD-10-CM

## 2022-07-10 DIAGNOSIS — K219 Gastro-esophageal reflux disease without esophagitis: Secondary | ICD-10-CM

## 2022-07-10 DIAGNOSIS — J302 Other seasonal allergic rhinitis: Secondary | ICD-10-CM

## 2022-07-10 DIAGNOSIS — Z8673 Personal history of transient ischemic attack (TIA), and cerebral infarction without residual deficits: Secondary | ICD-10-CM

## 2022-07-24 ENCOUNTER — Other Ambulatory Visit: Payer: Self-pay | Admitting: Family Medicine

## 2022-07-24 DIAGNOSIS — I10 Essential (primary) hypertension: Secondary | ICD-10-CM

## 2022-08-12 ENCOUNTER — Other Ambulatory Visit: Payer: Self-pay | Admitting: Family Medicine

## 2022-08-12 DIAGNOSIS — I6522 Occlusion and stenosis of left carotid artery: Secondary | ICD-10-CM

## 2022-08-12 DIAGNOSIS — E785 Hyperlipidemia, unspecified: Secondary | ICD-10-CM

## 2022-08-12 DIAGNOSIS — I701 Atherosclerosis of renal artery: Secondary | ICD-10-CM

## 2022-08-12 DIAGNOSIS — Z8673 Personal history of transient ischemic attack (TIA), and cerebral infarction without residual deficits: Secondary | ICD-10-CM

## 2022-08-12 DIAGNOSIS — R600 Localized edema: Secondary | ICD-10-CM

## 2022-08-14 ENCOUNTER — Other Ambulatory Visit: Payer: Self-pay | Admitting: *Deleted

## 2022-08-14 MED ORDER — ALENDRONATE SODIUM 70 MG PO TABS: ORAL_TABLET | ORAL | 0 refills | Status: DC

## 2022-09-19 ENCOUNTER — Encounter (HOSPITAL_COMMUNITY): Payer: Self-pay | Admitting: Emergency Medicine

## 2022-09-19 ENCOUNTER — Ambulatory Visit (INDEPENDENT_AMBULATORY_CARE_PROVIDER_SITE_OTHER): Payer: Medicare Other | Admitting: Family Medicine

## 2022-09-19 ENCOUNTER — Emergency Department (HOSPITAL_COMMUNITY)
Admission: EM | Admit: 2022-09-19 | Discharge: 2022-09-19 | Disposition: A | Discharge status: Home / Self Care | Payer: Medicare Other | Attending: Emergency Medicine | Admitting: Emergency Medicine

## 2022-09-19 ENCOUNTER — Other Ambulatory Visit: Payer: Self-pay

## 2022-09-19 ENCOUNTER — Encounter: Payer: Self-pay | Admitting: Family Medicine

## 2022-09-19 ENCOUNTER — Emergency Department (HOSPITAL_COMMUNITY): Payer: Medicare Other

## 2022-09-19 VITALS — BP 149/72 | HR 63 | Temp 97.3°F

## 2022-09-19 DIAGNOSIS — R42 Dizziness and giddiness: Secondary | ICD-10-CM | POA: Insufficient documentation

## 2022-09-19 DIAGNOSIS — R110 Nausea: Secondary | ICD-10-CM | POA: Insufficient documentation

## 2022-09-19 DIAGNOSIS — R531 Weakness: Secondary | ICD-10-CM | POA: Insufficient documentation

## 2022-09-19 DIAGNOSIS — R5381 Other malaise: Secondary | ICD-10-CM | POA: Diagnosis not present

## 2022-09-19 DIAGNOSIS — R0602 Shortness of breath: Secondary | ICD-10-CM | POA: Diagnosis not present

## 2022-09-19 DIAGNOSIS — R55 Syncope and collapse: Secondary | ICD-10-CM | POA: Diagnosis not present

## 2022-09-19 DIAGNOSIS — Z79899 Other long term (current) drug therapy: Secondary | ICD-10-CM | POA: Insufficient documentation

## 2022-09-19 DIAGNOSIS — R9431 Abnormal electrocardiogram [ECG] [EKG]: Secondary | ICD-10-CM | POA: Insufficient documentation

## 2022-09-19 DIAGNOSIS — M545 Low back pain, unspecified: Secondary | ICD-10-CM | POA: Diagnosis not present

## 2022-09-19 DIAGNOSIS — E876 Hypokalemia: Secondary | ICD-10-CM | POA: Diagnosis not present

## 2022-09-19 DIAGNOSIS — I959 Hypotension, unspecified: Secondary | ICD-10-CM | POA: Diagnosis not present

## 2022-09-19 DIAGNOSIS — R0689 Other abnormalities of breathing: Secondary | ICD-10-CM | POA: Diagnosis not present

## 2022-09-19 DIAGNOSIS — N189 Chronic kidney disease, unspecified: Secondary | ICD-10-CM | POA: Insufficient documentation

## 2022-09-19 DIAGNOSIS — R519 Headache, unspecified: Secondary | ICD-10-CM | POA: Diagnosis not present

## 2022-09-19 DIAGNOSIS — M549 Dorsalgia, unspecified: Secondary | ICD-10-CM | POA: Diagnosis not present

## 2022-09-19 DIAGNOSIS — R630 Anorexia: Secondary | ICD-10-CM

## 2022-09-19 LAB — URINALYSIS, ROUTINE W REFLEX MICROSCOPIC
Bilirubin Urine: NEGATIVE
Glucose, UA: NEGATIVE mg/dL
Hgb urine dipstick: NEGATIVE
Ketones, ur: NEGATIVE mg/dL
Leukocytes,Ua: NEGATIVE
Nitrite: NEGATIVE
Protein, ur: NEGATIVE mg/dL
Specific Gravity, Urine: 1.005 (ref 1.005–1.030)
pH: 7 (ref 5.0–8.0)

## 2022-09-19 LAB — MAGNESIUM: Magnesium: 2 mg/dL (ref 1.7–2.4)

## 2022-09-19 LAB — CBC
HCT: 38 % (ref 36.0–46.0)
Hemoglobin: 12.7 g/dL (ref 12.0–15.0)
MCH: 30.5 pg (ref 26.0–34.0)
MCHC: 33.4 g/dL (ref 30.0–36.0)
MCV: 91.3 fL (ref 80.0–100.0)
Platelets: 194 10*3/uL (ref 150–400)
RBC: 4.16 MIL/uL (ref 3.87–5.11)
RDW: 12.3 % (ref 11.5–15.5)
WBC: 7.8 10*3/uL (ref 4.0–10.5)
nRBC: 0 % (ref 0.0–0.2)

## 2022-09-19 LAB — BASIC METABOLIC PANEL
Anion gap: 9 (ref 5–15)
BUN: 16 mg/dL (ref 8–23)
CO2: 28 mmol/L (ref 22–32)
Calcium: 8.7 mg/dL — ABNORMAL LOW (ref 8.9–10.3)
Chloride: 100 mmol/L (ref 98–111)
Creatinine, Ser: 1.02 mg/dL — ABNORMAL HIGH (ref 0.44–1.00)
GFR, Estimated: 56 mL/min — ABNORMAL LOW (ref >60)
Glucose, Bld: 117 mg/dL — ABNORMAL HIGH (ref 70–99)
Potassium: 2.5 mmol/L — CL (ref 3.5–5.1)
Sodium: 137 mmol/L (ref 135–145)

## 2022-09-19 LAB — TROPONIN I (HIGH SENSITIVITY)
Troponin I (High Sensitivity): 10 ng/L (ref <18)
Troponin I (High Sensitivity): 11 ng/L (ref <18)

## 2022-09-19 LAB — GLUCOSE HEMOCUE WAIVED: Glu Hemocue Waived: 121 mg/dL — ABNORMAL HIGH (ref 70–99)

## 2022-09-19 LAB — POTASSIUM: Potassium: 4.1 mmol/L (ref 3.5–5.1)

## 2022-09-19 MED ORDER — POTASSIUM CHLORIDE 10 MEQ/100ML IV SOLN
10.0000 meq | INTRAVENOUS | Status: AC
  Administered 2022-09-19 (×3): 10 meq via INTRAVENOUS
  Filled 2022-09-19 (×3): qty 100

## 2022-09-19 MED ORDER — POTASSIUM CHLORIDE CRYS ER 20 MEQ PO TBCR: 20.0000 meq | EXTENDED_RELEASE_TABLET | Freq: Two times a day (BID) | ORAL | 0 refills | Status: DC

## 2022-09-19 MED ORDER — POTASSIUM CHLORIDE CRYS ER 20 MEQ PO TBCR
40.0000 meq | EXTENDED_RELEASE_TABLET | Freq: Once | ORAL | Status: AC
  Administered 2022-09-19: 40 meq via ORAL
  Filled 2022-09-19: qty 2

## 2022-09-19 MED ORDER — POTASSIUM CHLORIDE 20 MEQ PO PACK
20.0000 meq | PACK | Freq: Once | ORAL | Status: AC
  Administered 2022-09-19: 20 meq via ORAL
  Filled 2022-09-19: qty 1

## 2022-09-19 NOTE — ED Notes (Signed)
Date and time results received: 09/19/22 1207  (use smartphrase ".now" to insert current time)  Test: potassium Critical Value: 2.5  Name of Provider Notified: Dr Criss Alvine  Orders Received? Or Actions Taken?: Orders Received - See Orders for details

## 2022-09-19 NOTE — Progress Notes (Signed)
Subjective:  Patient ID: Tamara King, female    DOB: 04-06-1940, 82 y.o.   MRN: [id number]  Patient Care Team: Sonny Masters, FNP as PCP - General (Family Medicine) Jonelle Sidle, MD as PCP - Cardiology (Cardiology) Randa Lynn, MD as Consulting Physician (Nephrology) Iran Ouch, MD as Consulting Physician (Cardiology) Danella Maiers, [facility] as [facility] Care Management (Pharmacist) Iran Ouch, MD as Consulting Physician (Cardiology) Delora Fuel, [place] (Optometry)   Chief Complaint:  Dizziness   HPI: Tamara King is a 82 y.o. female presenting on 09/19/2022 for Dizziness   Pt presents today for chronic follow up. When ambulating to exam room, pt had a near syncopal episode and had to be caught and put in a chair, she did not hit the floor. Pt pale a diaphoretic. Finger stick blood sugar checked, 121. VS unremarkable, pulse ox 90% on room air, 2L via [place]dal[place] applied. Pt taken to exam room and evaluated. She notes over the last 1-1.5 weeks she has been feeling weak, tired, nauseated, short of breath, having palpitations, and not eating well. States she feels like she just wants to lay in the bed. She does report increased leg swelling. Denies chest, jaw, neck, back, arm, or abdominal pain.      Relevant past medical, surgical, family, and social history reviewed and updated as indicated.  Allergies and medications reviewed and updated. Data reviewed: Chart in Epic.   Past Medical History:  Diagnosis Date   Breast cancer (HCC)    Adenocarcinoma   Chronic edema    CKD (chronic kidney disease) stage 3, GFR 30-59 ml/min (HCC) 10/22/2018   Coronary atherosclerosis of native coronary artery    Nonobstructive at catheterization 2003, LVEF normal    Essential hypertension    GERD (gastroesophageal reflux disease) 07/06/2013   Idiopathic peripheral neuropathy 10/06/2013   Mixed hyperlipidemia    Occlusion and stenosis of left carotid artery  10/22/2017   Renal artery stenosis (HCC)    BMS bilateally 2003, PTCA bilaterally 2005 with restenosis   Sleep apnea    Stop Bang score of 4   Stroke [facility])    Symptomatic varicose veins, right 10/16/2018    Past Surgical History:  Procedure Laterality Date   APPENDECTOMY     CATARACT EXTRACTION W/PHACO  03/29/2011   Procedure: CATARACT EXTRACTION PHACO AND INTRAOCULAR LENS PLACEMENT (IOC);  Surgeon: Gemma Payor, MD;  Location: [facility];  Service: Ophthalmology;  Laterality: Right;  CDE:12.82   CATARACT EXTRACTION W/PHACO  04/16/2011   Procedure: CATARACT EXTRACTION PHACO AND INTRAOCULAR LENS PLACEMENT (IOC);  Surgeon: Gemma Payor, MD;  Location: [facility];  Service: Ophthalmology;  Laterality: Left;  CDE: 11.54   CESAREAN SECTION     x2   CHOLECYSTECTOMY  2018   HEMORRHOID SURGERY     RENAL ANGIOGRAPHY N/A 05/20/2019   Procedure: RENAL ANGIOGRAPHY;  Surgeon: Iran Ouch, MD;  Location: [facility] INVASIVE CV LAB;  Service: Cardiovascular;  Laterality: N/A;   RENAL INTERVENTION  05/20/2019   Procedure: RENAL INTERVENTION;  Surgeon: Iran Ouch, MD;  Location: [facility] INV[facility] CV LAB;  Service: Cardiovascular;;   Right breast lumpectomy     ROTATOR CUFF REPAIR  2008   UPPER EXTREMITY ANGIOGRAPHY N/A 11/27/2017   Procedure: UPPER EXTREMITY ANGIOGRAPHY;  Surgeon: Cephus Shelling, MD;  Location: [facility] INV[facility] CV LAB;  Service: Cardiovascular;  Laterality: N/A;    Social History   Socioeconomic History   Marital status: Widowed  Spouse name: Not on file   Number of children: 2   Years of education: 12th   Highest education level: 12th grade  Occupational History    Employer: RETIRED    Comment: n/a  Tobacco Use   Smoking status: Former    Current packs/day: 0.00    Average packs/day: 1 pack/day for 35.0 years (35.0 ttl pk-yrs)    Types: Cigarettes    Start date: 01/23/1963    Quit date: 01/22/1998    Years since quitting: 24.6   Smokeless tobacco: Never   Tobacco comments:    tobacco  use - no  Vaping Use   Vaping status: Never Used  Substance and Sexual Activity   Alcohol use: No   Drug use: No   Sexual activity: Not on file  Other Topics Concern   Not on file  Social History Narrative   Patient lives at home with family.  Two sons and two granddaughters live with her.   Caffeine use; 2-5 cups daily   Oldest son has cancer   Social Determinants of Health   Financial Resource Strain: Low Risk  (06/29/2022)   Overall Financial Resource Strain (CARDIA)    Difficulty of Paying Living Expenses: Not hard at all  Food Insecurity: No Food Insecurity (06/29/2022)   Hunger Vital Sign    Worried About Running Out of Food in the Last Year: Never true    Ran Out of Food in the Last Year: Never true  Transportation Needs: No Transportation Needs (06/29/2022)   PRAPARE - Administrator, Civil Service (Medical): No    Lack of Transportation (Non-Medical): No  Physical Activity: Insufficiently Active (06/29/2022)   Exercise Vital Sign    Days of Exercise per Week: 3 days    Minutes of Exercise per Session: 30 min  Stress: No Stress Concern Present (06/29/2022)   [employer] of Occupational Health - Occupational Stress Questionnaire    Feeling of Stress : Not at all  Social Connections: Socially Isolated (06/29/2022)   Social Connection and Isolation Panel [NHANES]    Frequency of Communication with Friends and Family: More than three times a week    Frequency of Social Gatherings with Friends and Family: More than three times a week    Attends Religious Services: Never    Database administrator or Organizations: No    Attends Banker Meetings: Never    Marital Status: Widowed  Intimate Partner Violence: Not At Risk (06/29/2022)   Humiliation, Afraid, Rape, and Kick questionnaire    Fear of Current or Ex-Partner: No    Emotionally Abused: No    Physically Abused: No    Sexually Abused: No    Outpatient Encounter Medications as of 09/19/2022   Medication Sig   acetaminophen (TYLENOL) 500 MG tablet Take 500 mg by mouth every 6 (six) hours as needed for moderate pain.   albuterol (VENTOLIN HFA) 108 (90 Base) MCG/ACT inhaler INHALE 2 PUFFS BY MOUTH EVERY 6 HOURS AS NEEDED FOR WHEEZING AND FOR SHORTNESS OF BREATH   alendronate (FOSAMAX) 70 MG tablet TAKE 1 TABLET BY MOUTH ONCE A WEEK WITH A FULL GLASS OF WATER ON AN EMPTY STOMACH   amLODipine (NORVASC) 5 MG tablet Take 1 tablet by mouth once daily   carvedilol (COREG) 25 MG tablet Take 1 tablet by mouth twice daily   cetirizine (EQ ALLERGY RELIEF, CETIRIZINE,) 10 MG tablet Take 1 tablet by mouth once daily   Cholecalciferol (VITAMIN D3) 25 MCG (  1000 UT) CAPS Take 1 capsule by mouth daily.   clopidogrel (PLAVIX) 75 MG tablet Take 1 tablet by mouth once daily   famotidine (PEPCID) 20 MG tablet Take 1 tablet by mouth once daily   fluticasone (FLONASE) 50 MCG/ACT nasal spray Place 2 sprays into the nose daily as needed for allergies (congestion.).    furosemide (LASIX) 40 MG tablet Take 1 tablet by mouth twice daily   hydrALAZINE (APRESOLINE) 10 MG tablet TAKE 2 TABLETS BY MOUTH THREE TIMES DAILY   pantoprazole (PROTONIX) 40 MG tablet Take 1 tablet by mouth once daily   potassium chloride SA (KLOR-CON M) 20 MEQ tablet Take 20 mEq by mouth 2 (two) times daily.   rosuvastatin (CRESTOR) 40 MG tablet Take 1 tablet by mouth once daily   No facility-administered encounter medications on file as of 09/19/2022.    Allergies  Allergen Reactions   Losartan Anaphylaxis   Sulfa Antibiotics Anaphylaxis and Swelling   Codeine Nausea And Vomiting   Fish Allergy Other (See Comments)    "sick as a dog"   Other Other (See Comments)    Bologna - vomiting   Spironolactone Other (See Comments)    Acute kidney injury   Vancomycin Other (See Comments)    "drives me crazy"   Penicillins Rash    Has patient had a PCN reaction causing immediate rash, facial/tongue/throat swelling, SOB or lightheadedness  with hypotension: Unknown Has patient had a PCN reaction causing severe rash involving mucus membranes or skin necrosis: Unknown Has patient had a PCN reaction that required hospitalization: No Has patient had a PCN reaction occurring within the last 10 years: No Childhood reaction. If all of the above answers are "NO", then may proceed with Cephalosporin use.     Review of Systems  Constitutional:  Positive for activity change, appetite change, diaphoresis and fatigue. Negative for chills, fever and unexpected weight change.  Respiratory:  Positive for shortness of breath. Negative for apnea, cough, choking, chest tightness, wheezing and stridor.   Cardiovascular:  Positive for palpitations and leg swelling. Negative for chest pain.  Gastrointestinal:  Positive for nausea. Negative for abdominal distention, abdominal pain, anal bleeding, blood in stool, constipation, diarrhea, rectal pain and vomiting.  Endocrine: Negative for polydipsia, polyphagia and polyuria.  Genitourinary:  Negative for decreased urine volume and difficulty urinating.  Neurological:  Positive for dizziness, weakness and light-headedness. Negative for syncope, speech difficulty, numbness and headaches.  Psychiatric/Behavioral:  Negative for confusion.   All other systems reviewed and are negative.       Objective:  BP (!) 149/72   Pulse 63   Temp (!) 97.3 F (36.3 C) (Temporal)   SpO2 95%    Wt Readings from Last 3 Encounters:  06/29/22 111 lb (50.3 kg)  04/10/22 118 lb 12.8 oz (53.9 kg)  03/21/22 118 lb 12.8 oz (53.9 kg)    Physical Exam Vitals and nursing note reviewed.  Constitutional:      General: She is in acute distress.     Appearance: Normal appearance. She is well-developed and well-groomed. She is ill-appearing and diaphoretic. She is not toxic-appearing.  HENT:     Head: Normocephalic and atraumatic.     Jaw: There is normal jaw occlusion.     Right Ear: Hearing normal.     Left Ear:  Hearing normal.     Nose: Nose normal.     Mouth/Throat:     Lips: Pink.     Pharynx: Oropharynx is clear. Uvula midline.  Eyes:     General: Lids are normal.     Extraocular Movements: Extraocular movements intact.     Conjunctiva/sclera: Conjunctivae normal.     Pupils: Pupils are equal, round, and reactive to light.  Neck:     Thyroid: No thyroid mass, thyromegaly or thyroid tenderness.     Vascular: Decreased carotid pulses. Carotid bruit present.     Trachea: Trachea and phonation normal.  Cardiovascular:     Rate and Rhythm: Normal rate and regular rhythm.     Chest Wall: PMI is not displaced.     Pulses: Normal pulses.     Heart sounds: Normal heart sounds. No murmur heard.    No friction rub. No gallop.  Pulmonary:     Effort: Pulmonary effort is normal. No respiratory distress.     Breath sounds: Normal breath sounds. No wheezing.  Abdominal:     General: Bowel sounds are normal. There is no distension or abdominal bruit.     Palpations: Abdomen is soft. There is no hepatomegaly or splenomegaly.     Tenderness: There is no abdominal tenderness. There is no right CVA tenderness or left CVA tenderness.     Hernia: No hernia is present.  Musculoskeletal:        General: Normal range of motion.     Cervical back: Normal range of motion and neck supple.     Right lower leg: No edema.     Left lower leg: No edema.  Lymphadenopathy:     Cervical: No cervical adenopathy.  Skin:    General: Skin is warm.     Capillary Refill: Capillary refill takes less than 2 seconds.     Coloration: Skin is pale. Skin is not cyanotic or jaundiced.     Findings: No rash.  Neurological:     General: No focal deficit present.     Mental Status: She is alert and oriented to person, place, and time.     Sensory: Sensation is intact.     Motor: Weakness present.     Coordination: Coordination is intact.     Gait: Gait abnormal (placed in wheelchair).     Deep Tendon Reflexes: Reflexes are  normal and symmetric.  Psychiatric:        Attention and Perception: Attention and perception normal.        Mood and Affect: Mood and affect normal.        Speech: Speech normal.        Behavior: Behavior normal. Behavior is cooperative.        Thought Content: Thought content normal.        Cognition and Memory: Cognition and memory normal.        Judgment: Judgment normal.     Results for orders placed or performed in visit on 03/21/22  CMP14+EGFR  Result Value Ref Range   Glucose 103 (H) 70 - 99 mg/dL   BUN 13 8 - 27 mg/dL   Creatinine, Ser 7.82 (H) 0.57 - 1.00 mg/dL   eGFR 54 (L) >95 AO/ZHY/8.65   BUN/Creatinine Ratio 12 12 - 28   Sodium 140 134 - 144 mmol/L   Potassium 3.9 3.5 - 5.2 mmol/L   Chloride 99 96 - 106 mmol/L   CO2 24 20 - 29 mmol/L   Calcium 9.3 8.7 - 10.3 mg/dL   Total Protein 6.9 6.0 - 8.5 g/dL   Albumin 4.6 3.8 - 4.8 g/dL   Globulin, Total 2.3 1.5 - 4.5 g/dL  Albumin/Globulin Ratio 2.0 1.2 - 2.2   Bilirubin Total 0.4 0.0 - 1.2 mg/dL   Alkaline Phosphatase 70 44 - 121 IU/L   AST 32 0 - 40 IU/L   ALT 24 0 - 32 IU/L  CBC with Differential/Platelet  Result Value Ref Range   WBC 7.7 3.4 - 10.8 x10E3/uL   RBC 4.49 3.77 - 5.28 x10E6/uL   Hemoglobin 13.7 11.1 - 15.9 g/dL   Hematocrit 16.1 09.6 - 46.6 %   MCV 91 79 - 97 fL   MCH 30.5 26.6 - 33.0 pg   MCHC 33.5 31.5 - 35.7 g/dL   RDW 04.5 40.9 - 81.1 %   Platelets 219 150 - 450 x10E3/uL   Neutrophils 59 Not Estab. %   Lymphs 31 Not Estab. %   Monocytes 7 Not Estab. %   Eos 2 Not Estab. %   Basos 1 Not Estab. %   Neutrophils Absolute 4.5 1.4 - 7.0 x10E3/uL   Lymphocytes Absolute 2.4 0.7 - 3.1 x10E3/uL   Monocytes Absolute 0.6 0.1 - 0.9 x10E3/uL   EOS (ABSOLUTE) 0.2 0.0 - 0.4 x10E3/uL   Basophils Absolute 0.0 0.0 - 0.2 x10E3/uL   Immature Granulocytes 0 Not Estab. %   Immature Grans (Abs) 0.0 0.0 - 0.1 x10E3/uL  Lipid panel  Result Value Ref Range   Cholesterol, Total 162 100 - 199 mg/dL    Triglycerides 914 (H) 0 - 149 mg/dL   HDL 82 >78 mg/dL   VLDL Cholesterol Cal 29 5 - 40 mg/dL   LDL Chol Calc (NIH) 51 0 - 99 mg/dL   Chol/HDL Ratio 2.0 0.0 - 4.4 ratio  Thyroid Panel With TSH  Result Value Ref Range   TSH 1.600 0.450 - 4.500 uIU/mL   T4, Total 10.0 4.5 - 12.0 ug/dL   T3 Uptake Ratio 27 24 - 39 %   Free Thyroxine Index 2.7 1.2 - 4.9  VITAMIN D 25 Hydroxy (Vit-D Deficiency, Fractures)  Result Value Ref Range   Vit D, 25-Hydroxy 33.7 30.0 - 100.0 ng/mL     EKG: LBBB, changes in leads II, III, aVR, and aVF from prior EKG.  Pertinent labs & imaging results that were available during my care of the patient were reviewed by me and considered in my medical decision making.  Assessment & Plan:  Dailany was seen today for dizziness.  Diagnoses and all orders for this visit:  Dizzy Near syncope Nausea General weakness Decreased appetite Malaise Shortness of breath EKG with changes from prior. Concerns for recent myocardial injury or worsening hear failure. EMS called and will take pt to ED for further evaluation.  -     Glucose Hemocue Waived -     EKG 12-Lead     Continue all other maintenance medications.  Follow up plan: ED by EMS   Kari Baars, FNP-C [facility] [phone]

## 2022-09-19 NOTE — ED Triage Notes (Signed)
Pt came from PCP office for ECG changes, near syncopal episode there, nausea and not feeling well x 1 week. Arrived a/o. Color wnl. Non diaphoretic.  Zofran given in route. Nausea better but still present cbg in route 121

## 2022-09-19 NOTE — ED Notes (Signed)
Patient ambulated from bed in room to nurses station with stand by assist. Patient tolerated well. Patient states no dizziness or lightheadedness as this time. Nurse notified.

## 2022-09-19 NOTE — Discharge Instructions (Addendum)
A pleasure taking care of you today.  You were seen because you had an episode of almost passing out and felt very weak.  Your potassium was extremely low today.  We replaced it and it is better, we are glad you are feeling better.  Since you take the Lasix this can make your potassium go low.  We will start you on daily potassium again.  Come back to the ER if you have new or worsening symptoms.

## 2022-09-19 NOTE — ED Provider Notes (Signed)
[place] EMERGENCY DEPARTMENT AT [facility], [facility], [facility] Provider Note   CSN: [id number] Arrival date & time: 09/19/22  1111     History  Chief Complaint  Patient presents with   Nausea   Abnormal ECG   Weakness    Tamara King is a 82 y.o. female.  PMH of renal artery stenosis, lower extremity edema, cholesterol, left carotid endarterectomy, CKD, CVA. She presents ER today complaining of dizziness.  She went to her primary care doctor office today for checkup and when walking had a near syncopal episode, did not fall, was lowered into a wheelchair, they were concerned about possible EKG changes and near syncope and sent her to the ER for further evaluation.  Denies any chest pain or shortness of breath.  States she has baseline lower extremity edema which is unchanged today.  States she had a similar episode on Saturday when she had a lower herself to the floor to prevent herself from passing out and crawl to a chair.  Did not fall or lose consciousness at that time either.  Reports she has had increase frequency and headaches recently but no headache at present.  No numbness tingling or weakness.    Weakness      Home Medications Prior to Admission medications   Medication Sig Start Date End Date Taking? Authorizing Provider  acetaminophen (TYLENOL) 500 MG tablet Take 500 mg by mouth every 6 (six) hours as needed for moderate pain.   Yes [provider]  alendronate (FOSAMAX) 70 MG tablet TAKE 1 TABLET BY MOUTH ONCE A WEEK WITH A FULL GLASS OF WATER ON AN EMPTY STOMACH 08/14/22  Yes Rakes, Doralee Albino, FNP  amLODipine (NORVASC) 5 MG tablet Take 1 tablet by mouth once daily 05/10/22  Yes Rakes, Doralee Albino, FNP  carvedilol (COREG) 25 MG tablet Take 1 tablet by mouth twice daily 05/01/22  Yes Rakes, Doralee Albino, FNP  cetirizine (EQ ALLERGY RELIEF, CETIRIZINE,) 10 MG tablet Take 1 tablet by mouth once daily 07/10/22  Yes Rakes, Doralee Albino, FNP  Cholecalciferol (VITAMIN D3) 25 MCG (1000 UT)  CAPS Take 1 capsule by mouth daily. 06/09/21  Yes [provider]  clopidogrel (PLAVIX) 75 MG tablet Take 1 tablet by mouth once daily 07/10/22  Yes Rakes, Doralee Albino, FNP  famotidine (PEPCID) 20 MG tablet Take 1 tablet by mouth once daily 07/10/22  Yes Rakes, Doralee Albino, FNP  furosemide (LASIX) 40 MG tablet Take 1 tablet by mouth twice daily 08/14/22  Yes Rakes, Doralee Albino, FNP  hydrALAZINE (APRESOLINE) 10 MG tablet TAKE 2 TABLETS BY MOUTH THREE TIMES DAILY 07/25/22  Yes Sonny Masters, FNP  pantoprazole (PROTONIX) 40 MG tablet Take 1 tablet by mouth once daily 07/10/22  Yes Rakes, Doralee Albino, FNP  rosuvastatin (CRESTOR) 40 MG tablet Take 1 tablet by mouth once daily 08/14/22  Yes Rakes, Doralee Albino, FNP  potassium chloride SA (KLOR-CON M) 20 MEQ tablet Take 1 tablet (20 mEq total) by mouth 2 (two) times daily. 09/19/22   Carmel Sacramento A, PA-C      Allergies    Losartan, Sulfa antibiotics, Codeine, Fish allergy, Other, Spironolactone, Vancomycin, and Penicillins    Review of Systems   Review of Systems  Neurological:  Positive for weakness.    Physical Exam Updated Vital Signs BP 133/69   Pulse (!) 52   Temp 97.6 F (36.4 C) (Oral)   Resp 18   SpO2 94%  Physical Exam Vitals and nursing note reviewed.  Constitutional:  General: She is not in acute distress.    Appearance: She is well-developed.  HENT:     Head: Normocephalic and atraumatic.     Mouth/Throat:     Mouth: Mucous membranes are moist.  Eyes:     Conjunctiva/sclera: Conjunctivae normal.  Cardiovascular:     Rate and Rhythm: Normal rate and regular rhythm.     Heart sounds: No murmur heard. Pulmonary:     Effort: Pulmonary effort is normal. No respiratory distress.     Breath sounds: Normal breath sounds.  Abdominal:     Palpations: Abdomen is soft.     Tenderness: There is no abdominal tenderness.  Musculoskeletal:        General: No swelling. Normal range of motion.     Cervical back: Neck supple.  Skin:     General: Skin is warm and dry.     Capillary Refill: Capillary refill takes less than 2 seconds.  Neurological:     General: No focal deficit present.     Mental Status: She is alert and oriented to person, place, and time. Mental status is at baseline.     Motor: No weakness.     Coordination: Coordination normal.     Gait: Gait normal.  Psychiatric:        Mood and Affect: Mood normal.     ED Results / Procedures / Treatments   Labs (all labs ordered are listed, but only abnormal results are displayed) Labs Reviewed  BASIC METABOLIC PANEL - Abnormal; Notable for the following components:      Result Value   Potassium 2.5 (*)    Glucose, Bld 117 (*)    Creatinine, Ser 1.02 (*)    Calcium 8.7 (*)    GFR, Estimated 56 (*)    All other components within normal limits  URINALYSIS, ROUTINE W REFLEX MICROSCOPIC - Abnormal; Notable for the following components:   Color, Urine STRAW (*)    All other components within normal limits  CBC  MAGNESIUM  POTASSIUM  TROPONIN I (HIGH SENSITIVITY)  TROPONIN I (HIGH SENSITIVITY)    EKG EKG Interpretation Date/Time:  Wednesday September 19 2022 11:19:28 EDT Ventricular Rate:  66 PR Interval:  245 QRS Duration:  133 QT Interval:  465 QTC Calculation: 488 R Axis:   -40  Text Interpretation: Sinus rhythm Prolonged PR interval Left bundle branch block no significant change since 2019 Confirmed by Pricilla Loveless [phone]) on 09/19/2022 12:09:47 PM  Radiology CT Head Wo Contrast  Result Date: 09/19/2022 CLINICAL DATA:  Headache, increasing frequency or severity EXAM: CT HEAD WITHOUT CONTRAST TECHNIQUE: Contiguous axial images were obtained from the base of the skull through the vertex without intravenous contrast. RADIATION DOSE REDUCTION: This exam was performed according to the departmental dose-optimization program which includes automated exposure control, adjustment of the mA and/or kV according to patient size and/or use of iterative  reconstruction technique. COMPARISON:  CTA November 11, 2017. FINDINGS: Brain: No evidence of acute infarction, hemorrhage, hydrocephalus, extra-axial collection or mass lesion/mass effect. Vascular: No hyperdense vessel. Skull: No acute fracture. Sinuses/Orbits: No acute finding. IMPRESSION: No evidence of acute intracranial abnormality. Electronically Signed   By: Feliberto Harts M.D.   On: 09/19/2022 13:33    Procedures Procedures    Medications Ordered in ED Medications  potassium chloride SA (KLOR-CON M) CR tablet 40 mEq (40 mEq Oral Given 09/19/22 1225)  potassium chloride 10 mEq in 100 mL IVPB (0 mEq Intravenous Stopped 09/19/22 1610)  potassium chloride (KLOR-CON) packet  20 mEq (20 mEq Oral Given 09/19/22 1226)    ED Course/ Medical Decision Making/ A&P                                 Medical Decision Making This patient presents to the ED for concern of near syncope and generalized weakness, this involves an extensive number of treatment options, and is a complaint that carries with it a high risk of complications and morbidity.  The differential diagnosis includes syncope, near syncope, orthostatic hypotension, electrolyte abnormality, ACS, UTI, seizure, intracranial hemorrhage, other   Co morbidities that complicate the patient evaluation :   Renal artery stenosis, CKD, CVA   Additional history obtained:  Additional history obtained from EMR External records from outside source obtained and reviewed including notes   Lab Tests:  I Ordered, and personally interpreted labs.  The pertinent results include: Critically low potassium of 2.5, magnesium, troponin normal, no UTI, CBC normal, renal function at baseline   Imaging Studies ordered:  I ordered imaging studies including head CT which shows no intracranial hemorrhage or other acute abnormality I independently visualized and interpreted imaging within scope of identifying emergent findings  I agree with the radiologist  interpretation   Cardiac Monitoring: / EKG:  The patient was maintained on a cardiac monitor.  I personally viewed and interpreted the cardiac monitored which showed an underlying rhythm of: Sinus rhythm no ischemic changes     Problem List / ED Course / Critical interventions / Medication management  Syncope-patient's been having some intermittent generalized weakness and had a near syncopal event today at the doctor's office.  She feels back to baseline now, never lost consciousness no head injury, has been having some increasing frequency of headaches but has no focal neurologic exam.  Head CT normal, labs ordered, shows fairly low potassium which would account for patient's generalized weakness.  She is on Lasix but ran out of her potassium so she just stopped taking it and never got a refill.  Patient's potassium was repleted here and is now back to normal and she feels much better.  She is able to ambulate without difficulty.  She feels ready to go home.  We discussed admission versus discharge and she would prefer to go home I ordered medication including potassium for hypokalemia Reevaluation of the patient after these medicines showed that the patient resolved I have reviewed the patients home medicines and have made adjustments as needed       Amount and/or Complexity of Data Reviewed Labs: ordered. Radiology: ordered.  Risk Prescription drug management.           Final Clinical Impression(s) / ED Diagnoses Final diagnoses:  Hypokalemia  Near syncope    Rx / DC Orders ED Discharge Orders          Ordered    potassium chloride SA (KLOR-CON M) 20 MEQ tablet  2 times daily        09/19/22 [address] 09/19/22 1814    Pricilla Loveless, MD 09/20/22 1521

## 2022-09-21 ENCOUNTER — Telehealth: Payer: Self-pay | Admitting: Family Medicine

## 2022-10-03 ENCOUNTER — Encounter: Payer: Self-pay | Admitting: Family Medicine

## 2022-10-03 ENCOUNTER — Ambulatory Visit (INDEPENDENT_AMBULATORY_CARE_PROVIDER_SITE_OTHER): Payer: Medicare Other | Admitting: Family Medicine

## 2022-10-03 VITALS — BP 139/74 | HR 72 | Temp 96.6°F | Ht 63.0 in | Wt 111.4 lb

## 2022-10-03 DIAGNOSIS — N1831 Chronic kidney disease, stage 3a: Secondary | ICD-10-CM | POA: Diagnosis not present

## 2022-10-03 DIAGNOSIS — R5383 Other fatigue: Secondary | ICD-10-CM

## 2022-10-03 DIAGNOSIS — Z23 Encounter for immunization: Secondary | ICD-10-CM | POA: Diagnosis not present

## 2022-10-03 DIAGNOSIS — N280 Ischemia and infarction of kidney: Secondary | ICD-10-CM | POA: Diagnosis not present

## 2022-10-03 DIAGNOSIS — I5032 Chronic diastolic (congestive) heart failure: Secondary | ICD-10-CM | POA: Diagnosis not present

## 2022-10-03 DIAGNOSIS — E876 Hypokalemia: Secondary | ICD-10-CM

## 2022-10-03 DIAGNOSIS — N189 Chronic kidney disease, unspecified: Secondary | ICD-10-CM | POA: Diagnosis not present

## 2022-10-03 DIAGNOSIS — R5381 Other malaise: Secondary | ICD-10-CM | POA: Diagnosis not present

## 2022-10-03 DIAGNOSIS — R110 Nausea: Secondary | ICD-10-CM | POA: Diagnosis not present

## 2022-10-03 DIAGNOSIS — R809 Proteinuria, unspecified: Secondary | ICD-10-CM | POA: Diagnosis not present

## 2022-10-03 MED ORDER — POTASSIUM CHLORIDE CRYS ER 20 MEQ PO TBCR
20.0000 meq | EXTENDED_RELEASE_TABLET | Freq: Two times a day (BID) | ORAL | 0 refills | Status: DC
Start: 2022-10-03 — End: 2023-01-02

## 2022-10-03 MED ORDER — ONDANSETRON HCL 4 MG PO TABS: 4.0000 mg | ORAL_TABLET | Freq: Three times a day (TID) | ORAL | 0 refills | Status: DC | PRN

## 2022-10-03 NOTE — Progress Notes (Signed)
Subjective:  Patient ID: Tamara King, female    DOB: 1940-07-11, 82 y.o.   MRN: [id number]  Patient Care Team: Sonny Masters, FNP as PCP - General (Family Medicine) Jonelle Sidle, MD as PCP - Cardiology (Cardiology) Randa Lynn, MD as Consulting Physician (Nephrology) Iran Ouch, MD as Consulting Physician (Cardiology) Danella Maiers, [facility] as [facility] Care Management (Pharmacist) Iran Ouch, MD as Consulting Physician (Cardiology) Delora Fuel, [place] (Optometry)   Chief Complaint:  ER follow up  (09/19/2022 (7 hours)/[place] [facility] at [facility]- Hypokalemia)   HPI: Tamara King is a 82 y.o. female presenting on 10/03/2022 for ER follow up  (09/19/2022 (7 hours)/[place] [facility] at [facility]- Hypokalemia)   Pt presents today for ED discharge follow up. She had a near syncopal episode in the office at last visit and was sent to the ED. She was found to be hypokalemic and repletion therapy was initiated. She returns today for a 2 week follow up and repeat potassium level. States she is feeling better but does have some malaise and nausea at times. No vomiting with the nausea. No other associated symptoms.      Relevant past medical, surgical, family, and social history reviewed and updated as indicated.  Allergies and medications reviewed and updated. Data reviewed: Chart in Epic.   Past Medical History:  Diagnosis Date   Breast cancer (HCC)    Adenocarcinoma   Chronic edema    CKD (chronic kidney disease) stage 3, GFR 30-59 ml/min (HCC) 10/22/2018   Coronary atherosclerosis of native coronary artery    Nonobstructive at catheterization 2003, LVEF normal    Essential hypertension    GERD (gastroesophageal reflux disease) 07/06/2013   Idiopathic peripheral neuropathy 10/06/2013   Mixed hyperlipidemia    Occlusion and stenosis of left carotid artery 10/22/2017   Renal artery  stenosis (HCC)    BMS bilateally 2003, PTCA bilaterally 2005 with restenosis   Sleep apnea    Stop Bang score of 4   Stroke [facility])    Symptomatic varicose veins, right 10/16/2018    Past Surgical History:  Procedure Laterality Date   APPENDECTOMY     CATARACT EXTRACTION W/PHACO  03/29/2011   Procedure: CATARACT EXTRACTION PHACO AND INTRAOCULAR LENS PLACEMENT (IOC);  Surgeon: Gemma Payor, MD;  Location: [facility] ORS;  Service: Ophthalmology;  Laterality: Right;  CDE:12.82   CATARACT EXTRACTION W/PHACO  04/16/2011   Procedure: CATARACT EXTRACTION PHACO AND INTRAOCULAR LENS PLACEMENT (IOC);  Surgeon: Gemma Payor, MD;  Location: [facility] ORS;  Service: Ophthalmology;  Laterality: Left;  CDE: 11.54   CESAREAN SECTION     x2   CHOLECYSTECTOMY  2018   HEMORRHOID SURGERY     RENAL ANGIOGRAPHY N/A 05/20/2019   Procedure: RENAL ANGIOGRAPHY;  Surgeon: Iran Ouch, MD;  Location: [facility] INVASIVE CV LAB;  Service: Cardiovascular;  Laterality: N/A;   RENAL INTERVENTION  05/20/2019   Procedure: RENAL INTERVENTION;  Surgeon: Iran Ouch, MD;  Location: [facility] INVASIVE CV LAB;  Service: Cardiovascular;;   Right breast lumpectomy     ROTATOR CUFF REPAIR  2008   UPPER EXTREMITY ANGIOGRAPHY N/A 11/27/2017   Procedure: UPPER EXTREMITY ANGIOGRAPHY;  Surgeon: Cephus Shelling, MD;  Location: [facility] INVASIVE CV LAB;  Service: Cardiovascular;  Laterality: N/A;    Social History   Socioeconomic History   Marital status: Widowed    Spouse name: Not on file   Number of children:  2   Years of education: 12th   Highest education level: 12th grade  Occupational History    Employer: RETIRED    Comment: n/a  Tobacco Use   Smoking status: Former    Current packs/day: 0.00    Average packs/day: 1 pack/day for 35.0 years (35.0 ttl pk-yrs)    Types: Cigarettes    Start date: 01/23/1963    Quit date: 01/22/1998    Years since quitting: 24.7   Smokeless tobacco: Never   Tobacco comments:    tobacco use - no  Vaping Use    Vaping status: Never Used  Substance and Sexual Activity   Alcohol use: No   Drug use: No   Sexual activity: Not on file  Other Topics Concern   Not on file  Social History Narrative   Patient lives at home with family.  Two sons and two granddaughters live with her.   Caffeine use; 2-5 cups daily   Oldest son has cancer   Social Determinants of Health   Financial Resource Strain: Low Risk  (06/29/2022)   Overall Financial Resource Strain (CARDIA)    Difficulty of Paying Living Expenses: Not hard at all  Food Insecurity: No Food Insecurity (06/29/2022)   Hunger Vital Sign    Worried About Running Out of Food in the Last Year: Never true    Ran Out of Food in the Last Year: Never true  Transportation Needs: No Transportation Needs (06/29/2022)   PRAPARE - Administrator, Civil Service (Medical): No    Lack of Transportation (Non-Medical): No  Physical Activity: Insufficiently Active (06/29/2022)   Exercise Vital Sign    Days of Exercise per Week: 3 days    Minutes of Exercise per Session: 30 min  Stress: No Stress Concern Present (06/29/2022)   [employer] of Occupational Health - Occupational Stress Questionnaire    Feeling of Stress : Not at all  Social Connections: Socially Isolated (06/29/2022)   Social Connection and Isolation Panel [NHANES]    Frequency of Communication with Friends and Family: More than three times a week    Frequency of Social Gatherings with Friends and Family: More than three times a week    Attends Religious Services: Never    Database administrator or Organizations: No    Attends Banker Meetings: Never    Marital Status: Widowed  Intimate Partner Violence: Not At Risk (06/29/2022)   Humiliation, Afraid, Rape, and Kick questionnaire    Fear of Current or Ex-Partner: No    Emotionally Abused: No    Physically Abused: No    Sexually Abused: No    Outpatient Encounter Medications as of 10/03/2022  Medication Sig   acetaminophen  (TYLENOL) 500 MG tablet Take 500 mg by mouth every 6 (six) hours as needed for moderate pain.   alendronate (FOSAMAX) 70 MG tablet TAKE 1 TABLET BY MOUTH ONCE A WEEK WITH A FULL GLASS OF WATER ON AN EMPTY STOMACH   amLODipine (NORVASC) 5 MG tablet Take 1 tablet by mouth once daily   carvedilol (COREG) 25 MG tablet Take 1 tablet by mouth twice daily   cetirizine (EQ ALLERGY RELIEF, CETIRIZINE,) 10 MG tablet Take 1 tablet by mouth once daily   Cholecalciferol (VITAMIN D3) 25 MCG (1000 UT) CAPS Take 1 capsule by mouth daily.   clopidogrel (PLAVIX) 75 MG tablet Take 1 tablet by mouth once daily   famotidine (PEPCID) 20 MG tablet Take 1 tablet by mouth once daily  furosemide (LASIX) 40 MG tablet Take 1 tablet by mouth twice daily   hydrALAZINE (APRESOLINE) 10 MG tablet TAKE 2 TABLETS BY MOUTH THREE TIMES DAILY   ondansetron (ZOFRAN) 4 MG tablet Take 1 tablet (4 mg total) by mouth every 8 (eight) hours as needed for nausea or vomiting.   pantoprazole (PROTONIX) 40 MG tablet Take 1 tablet by mouth once daily   rosuvastatin (CRESTOR) 40 MG tablet Take 1 tablet by mouth once daily   [DISCONTINUED] potassium chloride SA (KLOR-CON M) 20 MEQ tablet Take 1 tablet (20 mEq total) by mouth 2 (two) times daily.   potassium chloride SA (KLOR-CON M) 20 MEQ tablet Take 1 tablet (20 mEq total) by mouth 2 (two) times daily.   No facility-administered encounter medications on file as of 10/03/2022.    Allergies  Allergen Reactions   Losartan Anaphylaxis   Sulfa Antibiotics Anaphylaxis and Swelling   Codeine Nausea And Vomiting   Fish Allergy Other (See Comments)    "sick as a dog"   Other Other (See Comments)    Bologna - vomiting   Spironolactone Other (See Comments)    Acute kidney injury   Vancomycin Other (See Comments)    "drives me crazy"   Penicillins Rash    Review of Systems  Constitutional:  Positive for activity change and fatigue. Negative for appetite change, chills, diaphoresis, fever and  unexpected weight change.  HENT: Negative.    Eyes: Negative.  Negative for photophobia and visual disturbance.  Respiratory:  Negative for cough, chest tightness and shortness of breath.   Cardiovascular:  Negative for chest pain, palpitations and leg swelling.  Gastrointestinal:  Positive for nausea. Negative for abdominal distention, abdominal pain, anal bleeding, blood in stool, constipation, diarrhea, rectal pain and vomiting.  Endocrine: Negative.  Negative for polydipsia, polyphagia and polyuria.  Genitourinary:  Negative for decreased urine volume, difficulty urinating, dysuria, frequency and urgency.  Musculoskeletal:  Negative for arthralgias and myalgias.  Skin: Negative.   Allergic/Immunologic: Negative.   Neurological:  Negative for dizziness, tremors, seizures, syncope, facial asymmetry, speech difficulty, weakness, light-headedness, numbness and headaches.  Hematological: Negative.   Psychiatric/Behavioral:  Negative for confusion, hallucinations, sleep disturbance and suicidal ideas.   All other systems reviewed and are negative.       Objective:  BP 139/74   Pulse 72   Temp (!) 96.6 F (35.9 C) (Temporal)   Ht 5\' 3"  (1.6 m)   Wt 111 lb 6.4 oz (50.5 kg)   SpO2 95%   BMI 19.73 kg/m    Wt Readings from Last 3 Encounters:  10/03/22 111 lb 6.4 oz (50.5 kg)  06/29/22 111 lb (50.3 kg)  04/10/22 118 lb 12.8 oz (53.9 kg)    Physical Exam Vitals and nursing note reviewed.  Constitutional:      General: She is not in acute distress.    Appearance: Normal appearance. She is well-developed, well-groomed and normal weight. She is not ill-appearing, toxic-appearing or diaphoretic.  HENT:     Head: Normocephalic and atraumatic.     Jaw: There is normal jaw occlusion.     Right Ear: Hearing normal.     Left Ear: Hearing normal.     Nose: Nose normal.     Mouth/Throat:     Lips: Pink.     Mouth: Mucous membranes are moist.     Pharynx: Oropharynx is clear. Uvula  midline.  Eyes:     General: Lids are normal.     Extraocular Movements: Extraocular  movements intact.     Conjunctiva/sclera: Conjunctivae normal.     Pupils: Pupils are equal, round, and reactive to light.  Neck:     Thyroid: No thyroid mass, thyromegaly or thyroid tenderness.     Vascular: Carotid bruit present. No JVD.     Trachea: Trachea and phonation normal.  Cardiovascular:     Rate and Rhythm: Normal rate and regular rhythm.     Chest Wall: PMI is not displaced.     Pulses: Normal pulses.     Heart sounds: Normal heart sounds. No murmur heard.    No friction rub. No gallop.  Pulmonary:     Effort: Pulmonary effort is normal. No respiratory distress.     Breath sounds: Normal breath sounds. No wheezing.  Abdominal:     General: Bowel sounds are normal. There is no distension or abdominal bruit.     Palpations: Abdomen is soft. There is no hepatomegaly or splenomegaly.     Tenderness: There is no abdominal tenderness. There is no right CVA tenderness or left CVA tenderness.     Hernia: No hernia is present.  Musculoskeletal:        General: Normal range of motion.     Cervical back: Normal range of motion and neck supple.     Right lower leg: No edema.     Left lower leg: No edema.  Lymphadenopathy:     Cervical: No cervical adenopathy.  Skin:    General: Skin is warm and dry.     Capillary Refill: Capillary refill takes less than 2 seconds.     Coloration: Skin is not cyanotic, jaundiced or pale.     Findings: No rash.  Neurological:     General: No focal deficit present.     Mental Status: She is alert and oriented to person, place, and time.     Sensory: Sensation is intact.     Motor: Motor function is intact.     Coordination: Coordination is intact.     Gait: Gait is intact.     Deep Tendon Reflexes: Reflexes are normal and symmetric.  Psychiatric:        Attention and Perception: Attention and perception normal.        Mood and Affect: Mood and affect  normal.        Speech: Speech normal.        Behavior: Behavior normal. Behavior is cooperative.        Thought Content: Thought content normal.        Cognition and Memory: Cognition and memory normal.        Judgment: Judgment normal.     Results for orders placed or performed during the hospital encounter of 09/19/22  Basic metabolic panel  Result Value Ref Range   Sodium 137 135 - 145 mmol/L   Potassium 2.5 (LL) 3.5 - 5.1 mmol/L   Chloride 100 98 - 111 mmol/L   CO2 28 22 - 32 mmol/L   Glucose, Bld 117 (H) 70 - 99 mg/dL   BUN 16 8 - 23 mg/dL   Creatinine, Ser 4.09 (H) 0.44 - 1.00 mg/dL   Calcium 8.7 (L) 8.9 - 10.3 mg/dL   GFR, Estimated 56 (L) >60 mL/min   Anion gap 9 5 - 15  CBC  Result Value Ref Range   WBC 7.8 4.0 - 10.5 K/uL   RBC 4.16 3.87 - 5.11 MIL/uL   Hemoglobin 12.7 12.0 - 15.0 g/dL   HCT 81.1 91.4 -  46.0 %   MCV 91.3 80.0 - 100.0 fL   MCH 30.5 26.0 - 34.0 pg   MCHC 33.4 30.0 - 36.0 g/dL   RDW 40.9 81.1 - 91.4 %   Platelets 194 150 - 400 K/uL   nRBC 0.0 0.0 - 0.2 %  Urinalysis, Routine w reflex microscopic -Urine, Clean Catch  Result Value Ref Range   Color, Urine STRAW (A) YELLOW   APPearance CLEAR CLEAR   Specific Gravity, Urine 1.005 1.005 - 1.030   pH 7.0 5.0 - 8.0   Glucose, UA NEGATIVE NEGATIVE mg/dL   Hgb urine dipstick NEGATIVE NEGATIVE   Bilirubin Urine NEGATIVE NEGATIVE   Ketones, ur NEGATIVE NEGATIVE mg/dL   Protein, ur NEGATIVE NEGATIVE mg/dL   Nitrite NEGATIVE NEGATIVE   Leukocytes,Ua NEGATIVE NEGATIVE  Magnesium  Result Value Ref Range   Magnesium 2.0 1.7 - 2.4 mg/dL  Potassium  Result Value Ref Range   Potassium 4.1 3.5 - 5.1 mmol/L  Troponin I (High Sensitivity)  Result Value Ref Range   Troponin I (High Sensitivity) 10 <18 ng/L  Troponin I (High Sensitivity)  Result Value Ref Range   Troponin I (High Sensitivity) 11 <18 ng/L       Pertinent labs & imaging results that were available during my care of the patient were reviewed  by me and considered in my medical decision making.  Assessment & Plan:  Lashana was seen today for er follow up .  Diagnoses and all orders for this visit:  Hypokalemia Repeat labs today and adjust regimen if warranted.  -     potassium chloride SA (KLOR-CON M) 20 MEQ tablet; Take 1 tablet (20 mEq total) by mouth 2 (two) times daily. -     CMP14+EGFR  Malaise and fatigue Will check below for potential underlying causes. Further treatment pending results.  -     CMP14+EGFR -     CBC with Differential/Platelet -     Thyroid Panel With TSH  Nausea in adult No other associated symptoms. Will provide below as needed for nausea. Pt aware of red flags. Report new, worsening, or persistent symptoms.  -     CMP14+EGFR -     CBC with Differential/Platelet -     ondansetron (ZOFRAN) 4 MG tablet; Take 1 tablet (4 mg total) by mouth every 8 (eight) hours as needed for nausea or vomiting.  Encounter for immunization -     Flu Vaccine Trivalent High Dose (Fluad)     Continue all other maintenance medications.  Follow up plan: Return in about 3 months (around 01/02/2023), or if symptoms worsen or fail to improve, for chronic follow up.   Continue healthy lifestyle choices, including diet (rich in fruits, vegetables, and lean proteins, and low in salt and simple carbohydrates) and exercise (at least 30 minutes of moderate physical activity daily).  The above assessment and management plan was discussed with the patient. The patient verbalized understanding of and has agreed to the management plan. Patient is aware to call the clinic if they develop any new symptoms or if symptoms persist or worsen. Patient is aware when to return to the clinic for a follow-up visit. Patient educated on when it is appropriate to go to the emergency department.   Kari Baars, FNP-C [facility] [phone]

## 2022-10-04 ENCOUNTER — Telehealth: Payer: Self-pay

## 2022-10-04 LAB — CMP14+EGFR
ALT: 22 IU/L (ref 0–32)
AST: 31 IU/L (ref 0–40)
Albumin: 4.6 g/dL (ref 3.8–4.8)
Alkaline Phosphatase: 59 IU/L (ref 44–121)
BUN/Creatinine Ratio: 12 (ref 12–28)
BUN: 13 mg/dL (ref 8–27)
Bilirubin Total: 0.5 mg/dL (ref 0.0–1.2)
CO2: 22 mmol/L (ref 20–29)
Calcium: 9.5 mg/dL (ref 8.7–10.3)
Chloride: 101 mmol/L (ref 96–106)
Creatinine, Ser: 1.08 mg/dL — ABNORMAL HIGH (ref 0.57–1.00)
Globulin, Total: 2.5 g/dL (ref 1.5–4.5)
Glucose: 112 mg/dL — ABNORMAL HIGH (ref 70–99)
Potassium: 3.9 mmol/L (ref 3.5–5.2)
Sodium: 140 mmol/L (ref 134–144)
Total Protein: 7.1 g/dL (ref 6.0–8.5)
eGFR: 52 mL/min/{1.73_m2} — ABNORMAL LOW (ref >59)

## 2022-10-04 LAB — CBC WITH DIFFERENTIAL/PLATELET
Basophils Absolute: 0 10*3/uL (ref 0.0–0.2)
Basos: 1 %
EOS (ABSOLUTE): 0.2 10*3/uL (ref 0.0–0.4)
Eos: 2 %
Hematocrit: 39.4 % (ref 34.0–46.6)
Hemoglobin: 13.1 g/dL (ref 11.1–15.9)
Immature Grans (Abs): 0 10*3/uL (ref 0.0–0.1)
Immature Granulocytes: 0 %
Lymphocytes Absolute: 2.1 10*3/uL (ref 0.7–3.1)
Lymphs: 27 %
MCH: 30.8 pg (ref 26.6–33.0)
MCHC: 33.2 g/dL (ref 31.5–35.7)
MCV: 93 fL (ref 79–97)
Monocytes Absolute: 0.4 10*3/uL (ref 0.1–0.9)
Monocytes: 6 %
Neutrophils Absolute: 5.1 10*3/uL (ref 1.4–7.0)
Neutrophils: 64 %
Platelets: 264 10*3/uL (ref 150–450)
RBC: 4.26 x10E6/uL (ref 3.77–5.28)
RDW: 12.1 % (ref 11.7–15.4)
WBC: 7.9 10*3/uL (ref 3.4–10.8)

## 2022-10-04 LAB — THYROID PANEL WITH TSH
Free Thyroxine Index: 2.9 (ref 1.2–4.9)
T3 Uptake Ratio: 26 % (ref 24–39)
T4, Total: 11 ug/dL (ref 4.5–12.0)
TSH: 1.66 u[IU]/mL (ref 0.450–4.500)

## 2022-10-04 NOTE — Telephone Encounter (Signed)
Transition Care Management Unsuccessful Follow-up Telephone Call  Date of discharge and from where:  Tamara King 8/28  Attempts:  1st Attempt  Reason for unsuccessful TCM follow-up call:  No answer/busy   Tamara King [place]  [facility], [facility] Guide, Phone: [phone] Website: [name]

## 2022-10-05 ENCOUNTER — Telehealth: Payer: Self-pay

## 2022-10-05 NOTE — Telephone Encounter (Signed)
Transition Care Management Unsuccessful Follow-up Telephone Call  Date of discharge and from where:  Tamara King 8/28  Attempts:  2nd Attempt  Reason for unsuccessful TCM follow-up call:  No answer/busy   Tamara King  [facility], [facility] Guide, Phone: [phone] Website: [name]

## 2022-10-08 ENCOUNTER — Other Ambulatory Visit: Payer: Self-pay | Admitting: Family Medicine

## 2022-10-08 DIAGNOSIS — K219 Gastro-esophageal reflux disease without esophagitis: Secondary | ICD-10-CM

## 2022-10-08 DIAGNOSIS — Z8673 Personal history of transient ischemic attack (TIA), and cerebral infarction without residual deficits: Secondary | ICD-10-CM

## 2022-10-08 DIAGNOSIS — I6522 Occlusion and stenosis of left carotid artery: Secondary | ICD-10-CM

## 2022-10-10 DIAGNOSIS — N280 Ischemia and infarction of kidney: Secondary | ICD-10-CM | POA: Diagnosis not present

## 2022-10-10 DIAGNOSIS — N2581 Secondary hyperparathyroidism of renal origin: Secondary | ICD-10-CM | POA: Diagnosis not present

## 2022-10-10 DIAGNOSIS — N1831 Chronic kidney disease, stage 3a: Secondary | ICD-10-CM | POA: Diagnosis not present

## 2022-10-10 DIAGNOSIS — I5032 Chronic diastolic (congestive) heart failure: Secondary | ICD-10-CM | POA: Diagnosis not present

## 2022-10-19 ENCOUNTER — Encounter: Payer: Self-pay | Admitting: Cardiology

## 2022-10-19 ENCOUNTER — Ambulatory Visit: Payer: Medicare Other | Attending: Cardiology | Admitting: Cardiology

## 2022-10-19 VITALS — BP 138/70 | HR 80 | Ht 63.5 in | Wt 111.6 lb

## 2022-10-19 DIAGNOSIS — I10 Essential (primary) hypertension: Secondary | ICD-10-CM | POA: Diagnosis not present

## 2022-10-19 DIAGNOSIS — R002 Palpitations: Secondary | ICD-10-CM

## 2022-10-19 DIAGNOSIS — E782 Mixed hyperlipidemia: Secondary | ICD-10-CM

## 2022-10-19 DIAGNOSIS — I6523 Occlusion and stenosis of bilateral carotid arteries: Secondary | ICD-10-CM | POA: Diagnosis not present

## 2022-10-19 NOTE — Progress Notes (Signed)
Cardiology Office Note  Date: 10/19/2022   ID: Jaliayah, Sahr [birth year], MRN [id number]  History of Present Illness: Tamara King is an 82 y.o. female last seen in March.  She is here today for a routine visit.  She does not report any exertional chest pain or worsening shortness of breath with typical activities.  Continues to have intermittent palpitations as before.  No sudden dizziness or syncope.  I reviewed her medications.  She remains on Coreg, Norvasc, Plavix, hydralazine, Lasix with potassium supplement, and Crestor.  Recent lab work shows LDL 51.  Physical Exam: VS:  BP 138/70 (BP Location: Left Arm)   Pulse 80   Ht 5' 3.5" (1.613 m)   Wt 111 lb 9.6 oz (50.6 kg)   SpO2 96%   BMI 19.46 kg/m , BMI Body mass index is 19.46 kg/m.  Wt Readings from Last 3 Encounters:  10/19/22 111 lb 9.6 oz (50.6 kg)  10/03/22 111 lb 6.4 oz (50.5 kg)  06/29/22 111 lb (50.3 kg)    General: Patient appears comfortable at rest. HEENT: Conjunctiva and lids normal. Neck: Supple, no elevated JVP or carotid bruits. Lungs: Clear to auscultation, nonlabored breathing at rest. Cardiac: Regular rate and rhythm, no S3, 2/6 systolic murmur. Extremities: No pitting edema.  ECG:  An ECG dated 09/19/2022 was personally reviewed today and demonstrated:  Sinus rhythm prolonged PR interval and left bundle branch block.  Labwork: 09/19/2022: Magnesium 2.0 10/03/2022: ALT 22; AST 31; BUN 13; Creatinine, Ser 1.08; Hemoglobin 13.1; Platelets 264; Potassium 3.9; Sodium 140; TSH 1.660     Component Value Date/Time   CHOL 162 03/21/2022 1006   TRIG 181 (H) 03/21/2022 1006   HDL 82 03/21/2022 1006   CHOLHDL 2.0 03/21/2022 1006   LDLCALC 51 03/21/2022 1006   Other Studies Reviewed Today:  Carotid Dopplers 05/03/2022: Summary:  Right Carotid: Velocities in the right ICA are consistent with a 1-39%  stenosis.   Left Carotid: Evidence consistent with a total occlusion of the left ICA.    Vertebrals:  Bilateral vertebral arteries demonstrate antegrade flow.  Subclavians: Right subclavian artery was stenotic. Normal flow  hemodynamics were               seen in the left subclavian artery.   Assessment and Plan:  1.  Intermittent palpitations, no change in frequency or association with syncope.  I have discussed placement of a cardiac monitor for further investigation, however she continues to prefer to hold off.   2.  Bilateral carotid artery disease with known occlusion of the LICA and mild RICA stenosis by follow-up carotid Dopplers in April.  Continue Plavix and Crestor.   3.  Bilateral renal artery stenosis status post previous stent intervention and angioplasty due to in-stent restenosis.  She follows with Dr. Kirke Corin.  Renal arterial Dopplers in December 2023 showed no evidence of in-stent restenosis.   4.  Essential hypertension.  Blood pressure control is reasonable today.  No changes were made in present regimen.   5.  History of nonobstructive coronary atherosclerosis as of 2003.  She does not report any angina.  Continue Plavix and Crestor.  Disposition:  Follow up  6 months.  Signed, Jonelle Sidle, M.D., F.A.C.C. [facility] at [facility]

## 2022-10-19 NOTE — Patient Instructions (Signed)

## 2022-10-26 ENCOUNTER — Other Ambulatory Visit: Payer: Self-pay | Admitting: Family Medicine

## 2022-10-26 DIAGNOSIS — I10 Essential (primary) hypertension: Secondary | ICD-10-CM

## 2022-11-12 ENCOUNTER — Other Ambulatory Visit: Payer: Self-pay | Admitting: Family Medicine

## 2022-11-12 DIAGNOSIS — R600 Localized edema: Secondary | ICD-10-CM

## 2022-11-12 DIAGNOSIS — I6522 Occlusion and stenosis of left carotid artery: Secondary | ICD-10-CM

## 2022-11-12 DIAGNOSIS — I10 Essential (primary) hypertension: Secondary | ICD-10-CM

## 2022-11-12 DIAGNOSIS — E785 Hyperlipidemia, unspecified: Secondary | ICD-10-CM

## 2022-11-12 DIAGNOSIS — Z8673 Personal history of transient ischemic attack (TIA), and cerebral infarction without residual deficits: Secondary | ICD-10-CM

## 2022-11-12 DIAGNOSIS — I701 Atherosclerosis of renal artery: Secondary | ICD-10-CM

## 2022-11-30 ENCOUNTER — Other Ambulatory Visit: Payer: Self-pay | Admitting: *Deleted

## 2022-11-30 MED ORDER — ALENDRONATE SODIUM 70 MG PO TABS: ORAL_TABLET | ORAL | 0 refills | Status: DC

## 2022-12-10 ENCOUNTER — Other Ambulatory Visit: Payer: Self-pay | Admitting: Family Medicine

## 2022-12-10 DIAGNOSIS — I10 Essential (primary) hypertension: Secondary | ICD-10-CM

## 2022-12-25 ENCOUNTER — Other Ambulatory Visit: Payer: Medicare Other

## 2022-12-25 DIAGNOSIS — N2581 Secondary hyperparathyroidism of renal origin: Secondary | ICD-10-CM | POA: Diagnosis not present

## 2022-12-25 DIAGNOSIS — N189 Chronic kidney disease, unspecified: Secondary | ICD-10-CM | POA: Diagnosis not present

## 2022-12-25 DIAGNOSIS — N280 Ischemia and infarction of kidney: Secondary | ICD-10-CM | POA: Diagnosis not present

## 2022-12-25 DIAGNOSIS — N1831 Chronic kidney disease, stage 3a: Secondary | ICD-10-CM | POA: Diagnosis not present

## 2022-12-25 DIAGNOSIS — I5032 Chronic diastolic (congestive) heart failure: Secondary | ICD-10-CM | POA: Diagnosis not present

## 2023-01-02 ENCOUNTER — Ambulatory Visit: Payer: Medicare Other | Admitting: Family Medicine

## 2023-01-02 ENCOUNTER — Encounter: Payer: Self-pay | Admitting: Family Medicine

## 2023-01-02 VITALS — BP 132/68 | HR 68 | Temp 96.6°F | Ht 63.5 in | Wt 107.0 lb

## 2023-01-02 DIAGNOSIS — E876 Hypokalemia: Secondary | ICD-10-CM

## 2023-01-02 DIAGNOSIS — N1832 Chronic kidney disease, stage 3b: Secondary | ICD-10-CM

## 2023-01-02 DIAGNOSIS — I6522 Occlusion and stenosis of left carotid artery: Secondary | ICD-10-CM

## 2023-01-02 DIAGNOSIS — I10 Essential (primary) hypertension: Secondary | ICD-10-CM

## 2023-01-02 DIAGNOSIS — I701 Atherosclerosis of renal artery: Secondary | ICD-10-CM | POA: Diagnosis not present

## 2023-01-02 DIAGNOSIS — Z8673 Personal history of transient ischemic attack (TIA), and cerebral infarction without residual deficits: Secondary | ICD-10-CM

## 2023-01-02 DIAGNOSIS — K219 Gastro-esophageal reflux disease without esophagitis: Secondary | ICD-10-CM | POA: Diagnosis not present

## 2023-01-02 DIAGNOSIS — M150 Primary generalized (osteo)arthritis: Secondary | ICD-10-CM

## 2023-01-02 DIAGNOSIS — M810 Age-related osteoporosis without current pathological fracture: Secondary | ICD-10-CM | POA: Diagnosis not present

## 2023-01-02 DIAGNOSIS — E782 Mixed hyperlipidemia: Secondary | ICD-10-CM

## 2023-01-02 DIAGNOSIS — R600 Localized edema: Secondary | ICD-10-CM

## 2023-01-02 DIAGNOSIS — E559 Vitamin D deficiency, unspecified: Secondary | ICD-10-CM | POA: Diagnosis not present

## 2023-01-02 MED ORDER — FAMOTIDINE 20 MG PO TABS
20.0000 mg | ORAL_TABLET | Freq: Every day | ORAL | 1 refills | Status: DC
Start: 2023-01-02 — End: 2023-07-03

## 2023-01-02 MED ORDER — POTASSIUM CHLORIDE CRYS ER 20 MEQ PO TBCR
20.0000 meq | EXTENDED_RELEASE_TABLET | Freq: Two times a day (BID) | ORAL | 1 refills | Status: DC
Start: 2023-01-02 — End: 2023-07-03

## 2023-01-02 MED ORDER — ROSUVASTATIN CALCIUM 40 MG PO TABS
40.0000 mg | ORAL_TABLET | Freq: Every day | ORAL | 1 refills | Status: DC
Start: 2023-01-02 — End: 2023-07-03

## 2023-01-02 MED ORDER — FUROSEMIDE 40 MG PO TABS: 40.0000 mg | ORAL_TABLET | Freq: Two times a day (BID) | ORAL | 1 refills | Status: DC

## 2023-01-02 MED ORDER — PANTOPRAZOLE SODIUM 40 MG PO TBEC
DELAYED_RELEASE_TABLET | ORAL | 1 refills | Status: DC
Start: 2023-01-02 — End: 2023-07-03

## 2023-01-02 MED ORDER — CARVEDILOL 25 MG PO TABS
25.0000 mg | ORAL_TABLET | Freq: Two times a day (BID) | ORAL | 1 refills | Status: DC
Start: 2023-01-02 — End: 2023-07-03

## 2023-01-02 MED ORDER — AMLODIPINE BESYLATE 5 MG PO TABS
5.0000 mg | ORAL_TABLET | Freq: Every day | ORAL | 1 refills | Status: DC
Start: 2023-01-02 — End: 2023-07-03

## 2023-01-02 NOTE — Progress Notes (Signed)
Subjective:  Patient ID: Tamara King, female    DOB: [birth year], 82 y.o.   MRN: [id number]  Patient Care Team: Sonny Masters, FNP as PCP - General (Family Medicine) Jonelle Sidle, MD as PCP - Cardiology (Cardiology) Randa Lynn, MD as Consulting Physician (Nephrology) Iran Ouch, MD as Consulting Physician (Cardiology) Danella Maiers, [facility] as [facility] Care Management (Pharmacist) Iran Ouch, MD as Consulting Physician (Cardiology) Delora Fuel, OD (Optometry)   Chief Complaint:  Medical Management of Chronic Issues (3 month follow up )   HPI: Tamara King is a 82 y.o. female presenting on 01/02/2023 for Medical Management of Chronic Issues (3 month follow up )   Discussed the use of AI scribe software for clinical note transcription with the patient, who gave verbal consent to proceed.  History of Present Illness   The patient, with a history of carotid artery stenosis, kidney disease, and shoulder pain, presents with intermittent palpitations and episodes of nausea. They report that the palpitations occur "pretty often," but also have periods where they are not bothersome. The patient denies any associated chest pain or syncope.  The patient also experiences episodes of nausea, which occur sporadically and are not associated with vomiting or abdominal pain. They have been managing these episodes with medication as needed.  The patient has been experiencing shoulder pain, which they attribute to a need for shoulder surgery, a procedure they are hesitant to undergo due to its invasive nature.  The patient also reports intermittent episodes of feeling off-balance, but denies any syncope or loss of vision. They do note, however, that their left eye occasionally "glazes over," causing temporary vision impairment and discomfort. This issue has been ongoing for several years and was last evaluated by an ophthalmologist the previous  year.   The patient has been managing their carotid artery stenosis with Plavix and Crestor, and is due for a follow-up test in January. They also report regular follow-ups with their nephrologist for their kidney disease, with labs drawn just last week.  The patient also mentions some cognitive concerns, specifically difficulty with recall, particularly of names. They express concern about this due to a family history of dementia and Alzheimer's disease.          Relevant past medical, surgical, family, and social history reviewed and updated as indicated.  Allergies and medications reviewed and updated. Data reviewed: Chart in Epic.   Past Medical History:  Diagnosis Date   Breast cancer (HCC)    Adenocarcinoma   Chronic edema    CKD (chronic kidney disease) stage 3, GFR 30-59 ml/min (HCC) 10/22/2018   Coronary atherosclerosis of native coronary artery    Nonobstructive at catheterization 2003, LVEF normal    Essential hypertension    GERD (gastroesophageal reflux disease) 07/06/2013   Idiopathic peripheral neuropathy 10/06/2013   Mixed hyperlipidemia    Occlusion and stenosis of left carotid artery 10/22/2017   Renal artery stenosis (HCC)    BMS bilateally 2003, PTCA bilaterally 2005 with restenosis   Sleep apnea    Stop Bang score of 4   Stroke [facility])    Symptomatic varicose veins, right 10/16/2018    Past Surgical History:  Procedure Laterality Date   APPENDECTOMY     CATARACT EXTRACTION W/PHACO  03/29/2011   Procedure: CATARACT EXTRACTION PHACO AND INTRAOCULAR LENS PLACEMENT (IOC);  Surgeon: Gemma Payor, MD;  Location: [facility];  Service: Ophthalmology;  Laterality: Right;  CDE:12.82   CATARACT  EXTRACTION W/PHACO  04/16/2011   Procedure: CATARACT EXTRACTION PHACO AND INTRAOCULAR LENS PLACEMENT (IOC);  Surgeon: Gemma Payor, MD;  Location: [facility];  Service: Ophthalmology;  Laterality: Left;  CDE: 11.54   CESAREAN SECTION     x2   CHOLECYSTECTOMY  2018   HEMORRHOID SURGERY      RENAL ANGIOGRAPHY N/A 05/20/2019   Procedure: RENAL ANGIOGRAPHY;  Surgeon: Iran Ouch, MD;  Location: [facility] INVASIVE CV LAB;  Service: Cardiovascular;  Laterality: N/A;   RENAL INTERVENTION  05/20/2019   Procedure: RENAL INTERVENTION;  Surgeon: Iran Ouch, MD;  Location: [facility] INVASIVE CV LAB;  Service: Cardiovascular;;   Right breast lumpectomy     ROTATOR CUFF REPAIR  2008   UPPER EXTREMITY ANGIOGRAPHY N/A 11/27/2017   Procedure: UPPER EXTREMITY ANGIOGRAPHY;  Surgeon: Cephus Shelling, MD;  Location: [facility] INVASIVE CV LAB;  Service: Cardiovascular;  Laterality: N/A;    Social History   Socioeconomic History   Marital status: Widowed    Spouse name: Not on file   Number of children: 2   Years of education: 12th   Highest education level: 12th grade  Occupational History    Employer: RETIRED    Comment: n/a  Tobacco Use   Smoking status: Former    Current packs/day: 0.00    Average packs/day: 1 pack/day for 35.0 years (35.0 ttl pk-yrs)    Types: Cigarettes    Start date: 01/23/1963    Quit date: 01/22/1998    Years since quitting: 24.9   Smokeless tobacco: Never   Tobacco comments:    tobacco use - no  Vaping Use   Vaping status: Never Used  Substance and Sexual Activity   Alcohol use: No   Drug use: No   Sexual activity: Not on file  Other Topics Concern   Not on file  Social History Narrative   Patient lives at home with family.  Two sons and two granddaughters live with her.   Caffeine use; 2-5 cups daily   Oldest son has cancer   Social Determinants of Health   Financial Resource Strain: Low Risk  (06/29/2022)   Overall Financial Resource Strain (CARDIA)    Difficulty of Paying Living Expenses: Not hard at all  Food Insecurity: No Food Insecurity (06/29/2022)   Hunger Vital Sign    Worried About Running Out of Food in the Last Year: Never true    Ran Out of Food in the Last Year: Never true  Transportation Needs: No Transportation Needs (06/29/2022)   PRAPARE -  Administrator, Civil Service (Medical): No    Lack of Transportation (Non-Medical): No  Physical Activity: Insufficiently Active (06/29/2022)   Exercise Vital Sign    Days of Exercise per Week: 3 days    Minutes of Exercise per Session: 30 min  Stress: No Stress Concern Present (06/29/2022)   [employer] of Occupational Health - Occupational Stress Questionnaire    Feeling of Stress : Not at all  Social Connections: Socially Isolated (06/29/2022)   Social Connection and Isolation Panel [NHANES]    Frequency of Communication with Friends and Family: More than three times a week    Frequency of Social Gatherings with Friends and Family: More than three times a week    Attends Religious Services: Never    Database administrator or Organizations: No    Attends Banker Meetings: Never    Marital Status: Widowed  Intimate Partner Violence: Not At Risk (06/29/2022)  Humiliation, Afraid, Rape, and Kick questionnaire    Fear of Current or Ex-Partner: No    Emotionally Abused: No    Physically Abused: No    Sexually Abused: No    Outpatient Encounter Medications as of 01/02/2023  Medication Sig   acetaminophen (TYLENOL) 500 MG tablet Take 500 mg by mouth every 6 (six) hours as needed for moderate pain.   alendronate (FOSAMAX) 70 MG tablet TAKE 1 TABLET BY MOUTH ONCE A WEEK WITH A FULL GLASS OF WATER ON AN EMPTY STOMACH   cetirizine (EQ ALLERGY RELIEF, CETIRIZINE,) 10 MG tablet Take 1 tablet by mouth once daily   Cholecalciferol (VITAMIN D3) 25 MCG (1000 UT) CAPS Take 1 capsule by mouth daily.   clopidogrel (PLAVIX) 75 MG tablet Take 1 tablet by mouth once daily   hydrALAZINE (APRESOLINE) 10 MG tablet TAKE 2 TABLETS BY MOUTH THREE TIMES DAILY   ondansetron (ZOFRAN) 4 MG tablet Take 1 tablet (4 mg total) by mouth every 8 (eight) hours as needed for nausea or vomiting.   [DISCONTINUED] amLODipine (NORVASC) 5 MG tablet Take 1 tablet by mouth once daily   [DISCONTINUED]  carvedilol (COREG) 25 MG tablet Take 1 tablet by mouth twice daily   [DISCONTINUED] famotidine (PEPCID) 20 MG tablet Take 1 tablet by mouth once daily   [DISCONTINUED] furosemide (LASIX) 40 MG tablet Take 1 tablet by mouth twice daily   [DISCONTINUED] pantoprazole (PROTONIX) 40 MG tablet Take 1 tablet by mouth once daily   [DISCONTINUED] potassium chloride SA (KLOR-CON M) 20 MEQ tablet Take 1 tablet (20 mEq total) by mouth 2 (two) times daily.   [DISCONTINUED] rosuvastatin (CRESTOR) 40 MG tablet Take 1 tablet by mouth once daily   amLODipine (NORVASC) 5 MG tablet Take 1 tablet (5 mg total) by mouth daily.   carvedilol (COREG) 25 MG tablet Take 1 tablet (25 mg total) by mouth 2 (two) times daily.   famotidine (PEPCID) 20 MG tablet Take 1 tablet (20 mg total) by mouth daily.   furosemide (LASIX) 40 MG tablet Take 1 tablet (40 mg total) by mouth 2 (two) times daily.   pantoprazole (PROTONIX) 40 MG tablet Take 1 tablet by mouth once daily   potassium chloride SA (KLOR-CON M) 20 MEQ tablet Take 1 tablet (20 mEq total) by mouth 2 (two) times daily.   rosuvastatin (CRESTOR) 40 MG tablet Take 1 tablet (40 mg total) by mouth daily.   No facility-administered encounter medications on file as of 01/02/2023.    Allergies  Allergen Reactions   Losartan Anaphylaxis   Sulfa Antibiotics Anaphylaxis and Swelling   Codeine Nausea And Vomiting   Fish Allergy Other (See Comments)    "sick as a dog"   Other Other (See Comments)    Bologna - vomiting   Spironolactone Other (See Comments)    Acute kidney injury   Vancomycin Other (See Comments)    "drives me crazy"   Penicillins Rash    Pertinent ROS per HPI, otherwise unremarkable      Objective:  BP 132/68   Pulse 68   Temp (!) 96.6 F (35.9 C) (Temporal)   Ht 5' 3.5" (1.613 m)   Wt 107 lb (48.5 kg)   SpO2 92%   BMI 18.66 kg/m    Wt Readings from Last 3 Encounters:  01/02/23 107 lb (48.5 kg)  10/19/22 111 lb 9.6 oz (50.6 kg)  10/03/22  111 lb 6.4 oz (50.5 kg)    Physical Exam Vitals and nursing note reviewed.  Constitutional:      General: She is not in acute distress.    Appearance: Normal appearance. She is well-developed and well-groomed. She is not ill-appearing, toxic-appearing or diaphoretic.  HENT:     Head: Normocephalic and atraumatic.     Jaw: There is normal jaw occlusion.     Right Ear: Hearing normal.     Left Ear: Hearing normal.     Nose: Nose normal.     Mouth/Throat:     Lips: Pink.     Mouth: Mucous membranes are moist.     Pharynx: Oropharynx is clear. Uvula midline.  Eyes:     General: Lids are normal.     Extraocular Movements: Extraocular movements intact.     Conjunctiva/sclera: Conjunctivae normal.     Pupils: Pupils are equal, round, and reactive to light.  Neck:     Thyroid: No thyroid mass, thyromegaly or thyroid tenderness.     Vascular: Decreased carotid pulses. Carotid bruit present. No JVD.     Trachea: Trachea and phonation normal.  Cardiovascular:     Rate and Rhythm: Normal rate and regular rhythm.     Chest Wall: PMI is not displaced.     Pulses: Normal pulses.     Heart sounds: Normal heart sounds. No murmur heard.    No friction rub. No gallop.  Pulmonary:     Effort: Pulmonary effort is normal. No respiratory distress.     Breath sounds: Normal breath sounds. No wheezing.  Abdominal:     General: Bowel sounds are normal. There is no distension or abdominal bruit.     Palpations: Abdomen is soft. There is no hepatomegaly or splenomegaly.     Tenderness: There is no abdominal tenderness. There is no right CVA tenderness or left CVA tenderness.     Hernia: No hernia is present.  Musculoskeletal:     Cervical back: Normal range of motion and neck supple.     Right lower leg: No edema.     Left lower leg: No edema.     Comments: Slight kyphosis  Lymphadenopathy:     Cervical: No cervical adenopathy.  Skin:    General: Skin is warm and dry.     Capillary Refill:  Capillary refill takes less than 2 seconds.     Coloration: Skin is not cyanotic, jaundiced or pale.     Findings: No rash.  Neurological:     General: No focal deficit present.     Mental Status: She is alert and oriented to person, place, and time.     Sensory: Sensation is intact.     Motor: Motor function is intact.     Coordination: Coordination is intact.     Gait: Gait abnormal (slow, using cane).     Deep Tendon Reflexes: Reflexes are normal and symmetric.  Psychiatric:        Attention and Perception: Attention and perception normal.        Mood and Affect: Mood and affect normal.        Speech: Speech normal.        Behavior: Behavior normal. Behavior is cooperative.        Thought Content: Thought content normal.        Cognition and Memory: Cognition and memory normal.        Judgment: Judgment normal.     Results for orders placed or performed in visit on 10/03/22  CMP14+EGFR  Result Value Ref Range   Glucose 112 (H) 70 -  99 mg/dL   BUN 13 8 - 27 mg/dL   Creatinine, Ser 1.61 (H) 0.57 - 1.00 mg/dL   eGFR 52 (L) >09 UE/AVW/0.98   BUN/Creatinine Ratio 12 12 - 28   Sodium 140 134 - 144 mmol/L   Potassium 3.9 3.5 - 5.2 mmol/L   Chloride 101 96 - 106 mmol/L   CO2 22 20 - 29 mmol/L   Calcium 9.5 8.7 - 10.3 mg/dL   Total Protein 7.1 6.0 - 8.5 g/dL   Albumin 4.6 3.8 - 4.8 g/dL   Globulin, Total 2.5 1.5 - 4.5 g/dL   Bilirubin Total 0.5 0.0 - 1.2 mg/dL   Alkaline Phosphatase 59 44 - 121 IU/L   AST 31 0 - 40 IU/L   ALT 22 0 - 32 IU/L  CBC with Differential/Platelet  Result Value Ref Range   WBC 7.9 3.4 - 10.8 x10E3/uL   RBC 4.26 3.77 - 5.28 x10E6/uL   Hemoglobin 13.1 11.1 - 15.9 g/dL   Hematocrit 11.9 14.7 - 46.6 %   MCV 93 79 - 97 fL   MCH 30.8 26.6 - 33.0 pg   MCHC 33.2 31.5 - 35.7 g/dL   RDW 82.9 56.2 - 13.0 %   Platelets 264 150 - 450 x10E3/uL   Neutrophils 64 Not Estab. %   Lymphs 27 Not Estab. %   Monocytes 6 Not Estab. %   Eos 2 Not Estab. %   Basos 1  Not Estab. %   Neutrophils Absolute 5.1 1.4 - 7.0 x10E3/uL   Lymphocytes Absolute 2.1 0.7 - 3.1 x10E3/uL   Monocytes Absolute 0.4 0.1 - 0.9 x10E3/uL   EOS (ABSOLUTE) 0.2 0.0 - 0.4 x10E3/uL   Basophils Absolute 0.0 0.0 - 0.2 x10E3/uL   Immature Granulocytes 0 Not Estab. %   Immature Grans (Abs) 0.0 0.0 - 0.1 x10E3/uL  Thyroid Panel With TSH  Result Value Ref Range   TSH 1.660 0.450 - 4.500 uIU/mL   T4, Total 11.0 4.5 - 12.0 ug/dL   T3 Uptake Ratio 26 24 - 39 %   Free Thyroxine Index 2.9 1.2 - 4.9       Pertinent labs & imaging results that were available during my care of the patient were reviewed by me and considered in my medical decision making.  Assessment & Plan:  Tamara King was seen today for medical management of chronic issues.  Diagnoses and all orders for this visit:  Stage 3b chronic kidney disease (HCC) Followed by Dr. Wolfgang Phoenix   Essential (primary) hypertension -     amLODipine (NORVASC) 5 MG tablet; Take 1 tablet (5 mg total) by mouth daily. -     carvedilol (COREG) 25 MG tablet; Take 1 tablet (25 mg total) by mouth 2 (two) times daily. -     furosemide (LASIX) 40 MG tablet; Take 1 tablet (40 mg total) by mouth 2 (two) times daily.  Gastroesophageal reflux disease without esophagitis -     famotidine (PEPCID) 20 MG tablet; Take 1 tablet (20 mg total) by mouth daily. -     pantoprazole (PROTONIX) 40 MG tablet; Take 1 tablet by mouth once daily  Lower extremity edema -     furosemide (LASIX) 40 MG tablet; Take 1 tablet (40 mg total) by mouth 2 (two) times daily.  Hypokalemia -     potassium chloride SA (KLOR-CON M) 20 MEQ tablet; Take 1 tablet (20 mEq total) by mouth 2 (two) times daily.  Occlusion and stenosis of left carotid artery -  rosuvastatin (CRESTOR) 40 MG tablet; Take 1 tablet (40 mg total) by mouth daily.  History of CVA (cerebrovascular accident) -     rosuvastatin (CRESTOR) 40 MG tablet; Take 1 tablet (40 mg total) by mouth daily.  Mixed  hyperlipidemia -     rosuvastatin (CRESTOR) 40 MG tablet; Take 1 tablet (40 mg total) by mouth daily.  Renal artery stenosis (HCC) -     rosuvastatin (CRESTOR) 40 MG tablet; Take 1 tablet (40 mg total) by mouth daily.  Primary osteoarthritis involving multiple joints No new or worsening symptoms.   Vitamin D deficiency Continue repletion therapy.   Age-related osteoporosis without current pathological fracture Continue Fosamax. No ONJ signs present.     Assessment and Plan    Shoulder Pain Chronic shoulder pain, worsening recently. Patient is hesitant about shoulder surgery due to the perceived severity of the procedure and potential complications, including infection and limited range of motion. Prefers to avoid surgery at this time. - Consider physical therapy or other non-surgical interventions.  Carotid Artery Stenosis Internal carotid artery occlusion and right carotid stenosis. Patient is on Plavix and Crestor. Reports occasional dizziness and off-balance sensation but no syncope or vision loss. Discussed the importance of medication adherence to prevent stroke and manage symptoms. - Continue Plavix and Crestor. - Monitor for symptoms of dizziness, confusion, weakness, or vision loss. - Review results of renal artery studies in January.  Chronic Kidney Disease Recent lab work for kidney function and upcoming follow-up with nephrologist Dr. Gracelyn Nurse. No specific symptoms reported. - Review lab results from Dr. Gracelyn Nurse after the upcoming appointment. - Call patient if additional labs are needed.  Heart Palpitations Intermittent palpitations, frequency varies. Patient declined additional medication for palpitations due to concerns about efficacy. No recent episodes reported. - Monitor frequency and severity of palpitations. - Consider further evaluation if symptoms worsen.  Hypertension Blood pressure at home was 141/73. No significant swelling in legs or feet. Patient uses  compression socks intermittently. - Continue monitoring blood pressure at home. - Encourage regular use of compression socks.  Gastroesophageal Reflux Disease (GERD) Reflux well controlled with Pepcid and Protonix. No recent changes in symptoms. - Continue Pepcid and Protonix.  Cognitive Decline Reports difficulty with memory and word retrieval, possibly worsening. Family history of dementia and Alzheimer's disease. Discussed potential progression and the importance of monitoring cognitive function. - Monitor cognitive function. - Consider further evaluation if symptoms progress.  General Health Maintenance Reports difficulty swallowing large pills, particularly potassium pills. No issues with regular food and drink. Dental issues noted but no regular dental visits. - Encourage regular dental visits. - Consider alternative forms of potassium supplementation if swallowing difficulties persist.  Follow-up - Follow up with nephrologist Dr. Gracelyn Nurse on Friday. - Schedule regular follow-up in 4-6 months. - Advise patient to report any new symptoms or changes.          Continue all other maintenance medications.  Follow up plan: Return in about 6 months (around 07/03/2023), or if symptoms worsen or fail to improve, for chronic follow up.   Continue healthy lifestyle choices, including diet (rich in fruits, vegetables, and lean proteins, and low in salt and simple carbohydrates) and exercise (at least 30 minutes of moderate physical activity daily).  Educational handout given for health maintenance   The above assessment and management plan was discussed with the patient. The patient verbalized understanding of and has agreed to the management plan. Patient is aware to call the clinic if they develop any new  symptoms or if symptoms persist or worsen. Patient is aware when to return to the clinic for a follow-up visit. Patient educated on when it is appropriate to go to the emergency  department.   Kari Baars, FNP-C [facility] [phone]

## 2023-01-04 DIAGNOSIS — I5032 Chronic diastolic (congestive) heart failure: Secondary | ICD-10-CM | POA: Diagnosis not present

## 2023-01-04 DIAGNOSIS — R809 Proteinuria, unspecified: Secondary | ICD-10-CM | POA: Diagnosis not present

## 2023-01-04 DIAGNOSIS — N280 Ischemia and infarction of kidney: Secondary | ICD-10-CM | POA: Diagnosis not present

## 2023-01-04 DIAGNOSIS — N1831 Chronic kidney disease, stage 3a: Secondary | ICD-10-CM | POA: Diagnosis not present

## 2023-01-05 ENCOUNTER — Other Ambulatory Visit: Payer: Self-pay | Admitting: Family Medicine

## 2023-01-05 DIAGNOSIS — J302 Other seasonal allergic rhinitis: Secondary | ICD-10-CM

## 2023-01-05 DIAGNOSIS — I6522 Occlusion and stenosis of left carotid artery: Secondary | ICD-10-CM

## 2023-01-05 DIAGNOSIS — Z8673 Personal history of transient ischemic attack (TIA), and cerebral infarction without residual deficits: Secondary | ICD-10-CM

## 2023-01-28 ENCOUNTER — Telehealth: Payer: Self-pay | Admitting: Cardiology

## 2023-01-28 NOTE — Telephone Encounter (Signed)
 Patient is requesting to reschedule renal test. Orders have expired and need to be renewed to a later date. Patient is requesting call back to reschedule.

## 2023-01-29 ENCOUNTER — Encounter (HOSPITAL_COMMUNITY): Payer: Medicare Other

## 2023-02-15 ENCOUNTER — Ambulatory Visit (HOSPITAL_COMMUNITY)
Admission: RE | Admit: 2023-02-15 | Discharge: 2023-02-15 | Disposition: A | Discharge status: Home / Self Care | Payer: Medicare Other | Source: Ambulatory Visit | Attending: Internal Medicine | Admitting: Internal Medicine

## 2023-02-15 DIAGNOSIS — I701 Atherosclerosis of renal artery: Secondary | ICD-10-CM | POA: Diagnosis not present

## 2023-02-27 ENCOUNTER — Encounter: Payer: Self-pay | Admitting: *Deleted

## 2023-03-05 ENCOUNTER — Ambulatory Visit: Payer: Medicare Other | Admitting: Cardiovascular Disease

## 2023-03-17 NOTE — Progress Notes (Deleted)
   Cardiology Office Note    Date:  03/17/2023  ID:  Tamara, King [birth year], MRN [id number] PCP:  Sonny Masters, FNP  Cardiologist:  Nona Dell, MD  Electrophysiologist:  None   Chief Complaint: ***  History of Present Illness: .    Tamara King is a 83 y.o. female with visit-pertinent history of ***  Labwork independently reviewed:   ROS: .    Please see the history of present illness. Otherwise, review of systems is positive for ***.  All other systems are reviewed and otherwise negative.  Studies Reviewed: Marland Kitchen    EKG:  EKG is ordered today, personally reviewed, demonstrating ***  CV Studies: Cardiac studies reviewed are outlined and summarized above. Otherwise please see EMR for full report.   Current Reported Medications:.    No outpatient medications have been marked as taking for the 03/19/23 encounter (Appointment) with Rip Harbour, NP.    Physical Exam:    VS:  There were no vitals taken for this visit.   Wt Readings from Last 3 Encounters:  01/02/23 107 lb (48.5 kg)  10/19/22 111 lb 9.6 oz (50.6 kg)  10/03/22 111 lb 6.4 oz (50.5 kg)    GEN: Well nourished, well developed in no acute distress NECK: No JVD; No carotid bruits CARDIAC: ***RRR, no murmurs, rubs, gallops RESPIRATORY:  Clear to auscultation without rales, wheezing or rhonchi  ABDOMEN: Soft, non-tender, non-distended EXTREMITIES:  No edema; No acute deformity   Asessement and Plan:.     ***     Disposition: F/u with ***  Signed, Rip Harbour, NP

## 2023-03-19 ENCOUNTER — Ambulatory Visit: Payer: Medicare Other | Admitting: Cardiology

## 2023-03-19 ENCOUNTER — Encounter: Payer: Self-pay | Admitting: Cardiovascular Disease

## 2023-03-19 ENCOUNTER — Ambulatory Visit: Payer: Medicare Other | Attending: Cardiovascular Disease | Admitting: Cardiovascular Disease

## 2023-03-19 VITALS — BP 106/61 | HR 64 | Ht 63.0 in | Wt 109.6 lb

## 2023-03-19 DIAGNOSIS — E785 Hyperlipidemia, unspecified: Secondary | ICD-10-CM | POA: Diagnosis not present

## 2023-03-19 DIAGNOSIS — I701 Atherosclerosis of renal artery: Secondary | ICD-10-CM | POA: Diagnosis not present

## 2023-03-19 DIAGNOSIS — I10 Essential (primary) hypertension: Secondary | ICD-10-CM

## 2023-03-19 DIAGNOSIS — I779 Disorder of arteries and arterioles, unspecified: Secondary | ICD-10-CM | POA: Diagnosis not present

## 2023-03-19 NOTE — Progress Notes (Signed)
 Cardiology Office Note   Date:  03/19/2023   ID:  Ireland, Virrueta 1940/04/05, MRN [id number]  PCP:  Sonny Masters, FNP  Cardiologist: Dr. Diona Browner  Chief Complaint  Patient presents with   Shortness of Breath    Patient stated that when she gets excited she gets SOB at times and walking long and short distance        History of Present Illness: Tamara King is a 83 y.o. female who is here today for follow-up visit regarding renal artery stenosis.   She has history of mild nonobstructive coronary artery disease on previous cardiac catheterization in 2003.  She has known history of renal artery stenosis.  She underwent bilateral renal artery stenting in December 2003.  She had restenosis in 2005 and had repeat angioplasty for in-stent restenosis.   She has chronic medical conditions that include difficult to control hypertension, hyperlipidemia, carotid disease with known occluded left carotid artery, mild chronic kidney disease, previous tobacco use and left bundle branch block. She has intolerance to multiple antihypertensive medications .  She had worsening renal artery stenosis in 2021 and thus angiography was performed in April 2021 which showed patent bilateral renal artery stents with significant restenosis on the left side.  I performed successful balloon angioplasty of the left renal artery stent.  Recent renal artery duplex in January showed patent renal artery stents with no significant restenosis.  She has been doing well with no chest pain, worsening dyspnea or palpitations.  Her blood pressure has been on the low side and some of her antihypertensive medications were stopped or decreased by her nephrologist.  Past Medical History:  Diagnosis Date   Breast cancer (HCC)    Adenocarcinoma   Chronic edema    CKD (chronic kidney disease) stage 3, GFR 30-59 ml/min (HCC) 10/22/2018   Coronary atherosclerosis of native coronary artery    Nonobstructive at  catheterization 2003, LVEF normal    Essential hypertension    GERD (gastroesophageal reflux disease) 07/06/2013   Idiopathic peripheral neuropathy 10/06/2013   Mixed hyperlipidemia    Occlusion and stenosis of left carotid artery 10/22/2017   Renal artery stenosis (HCC)    BMS bilateally 2003, PTCA bilaterally 2005 with restenosis   Sleep apnea    Stop Bang score of 4   Stroke [facility]    Symptomatic varicose veins, right 10/16/2018    Past Surgical History:  Procedure Laterality Date   APPENDECTOMY     CATARACT EXTRACTION W/PHACO  03/29/2011   Procedure: CATARACT EXTRACTION PHACO AND INTRAOCULAR LENS PLACEMENT (IOC);  Surgeon: Gemma Payor, MD;  Location: [facility];  Service: Ophthalmology;  Laterality: Right;  CDE:12.82   CATARACT EXTRACTION W/PHACO  04/16/2011   Procedure: CATARACT EXTRACTION PHACO AND INTRAOCULAR LENS PLACEMENT (IOC);  Surgeon: Gemma Payor, MD;  Location: [facility];  Service: Ophthalmology;  Laterality: Left;  CDE: 11.54   CESAREAN SECTION     x2   CHOLECYSTECTOMY  2018   HEMORRHOID SURGERY     RENAL ANGIOGRAPHY N/A 05/20/2019   Procedure: RENAL ANGIOGRAPHY;  Surgeon: Iran Ouch, MD;  Location: [facility];  Service: Cardiovascular;  Laterality: N/A;   RENAL INTERVENTION  05/20/2019   Procedure: RENAL INTERVENTION;  Surgeon: Iran Ouch, MD;  Location: [facility];  Service: Cardiovascular;;   Right breast lumpectomy     ROTATOR CUFF REPAIR  2008   UPPER EXTREMITY ANGIOGRAPHY N/A 11/27/2017   Procedure: UPPER EXTREMITY ANGIOGRAPHY;  Surgeon: Chestine Spore,  Canary Brim, MD;  Location: [facility];  Service: Cardiovascular;  Laterality: N/A;     Current Outpatient Medications  Medication Sig Dispense Refill   acetaminophen (TYLENOL) 500 MG tablet Take 500 mg by mouth every 6 (six) hours as needed for moderate pain.     alendronate (FOSAMAX) 70 MG tablet TAKE 1 TABLET BY MOUTH ONCE A WEEK WITH A FULL GLASS OF WATER ON AN EMPTY STOMACH 12 tablet 0    amLODipine (NORVASC) 5 MG tablet Take 1 tablet (5 mg total) by mouth daily. 90 tablet 1   carvedilol (COREG) 25 MG tablet Take 1 tablet (25 mg total) by mouth 2 (two) times daily. 180 tablet 1   Cholecalciferol (VITAMIN D3) 25 MCG (1000 UT) CAPS Take 1 capsule by mouth daily.     clopidogrel (PLAVIX) 75 MG tablet Take 1 tablet by mouth once daily 90 tablet 1   EQ ALLERGY RELIEF, CETIRIZINE, 10 MG tablet Take 1 tablet by mouth once daily 90 tablet 1   famotidine (PEPCID) 20 MG tablet Take 1 tablet (20 mg total) by mouth daily. 90 tablet 1   furosemide (LASIX) 40 MG tablet Take 1 tablet (40 mg total) by mouth 2 (two) times daily. 180 tablet 1   hydrALAZINE (APRESOLINE) 10 MG tablet TAKE 2 TABLETS BY MOUTH THREE TIMES DAILY 180 tablet 0   ondansetron (ZOFRAN) 4 MG tablet Take 1 tablet (4 mg total) by mouth every 8 (eight) hours as needed for nausea or vomiting. 20 tablet 0   pantoprazole (PROTONIX) 40 MG tablet Take 1 tablet by mouth once daily 90 tablet 1   potassium chloride SA (KLOR-CON M) 20 MEQ tablet Take 1 tablet (20 mEq total) by mouth 2 (two) times daily. 180 tablet 1   rosuvastatin (CRESTOR) 40 MG tablet Take 1 tablet (40 mg total) by mouth daily. 90 tablet 1   No current facility-administered medications for this visit.    Allergies:   Losartan, Sulfa antibiotics, Codeine, Fish allergy, Other, Spironolactone, Vancomycin, and Penicillins    Social History:  The patient  reports that she quit smoking about 25 years ago. Her smoking use included cigarettes. She started smoking about 60 years ago. She has a 35 pack-year smoking history. She has never used smokeless tobacco. She reports that she does not drink alcohol and does not use drugs.   Family History:  The patient's family history includes Brain cancer in her brother and sister; COPD in her sister; Cancer in her son; Diabetes in her brother, brother, father, sister, son, and son; Heart failure in her mother; Lung cancer in her father;  Stroke (age of onset: 32) in her mother; Thyroid cancer in her son.    ROS:  Please see the history of present illness.   Otherwise, review of systems are positive for none.   All other systems are reviewed and negative.    PHYSICAL EXAM: VS:  BP 106/61   Pulse 64   Ht 5\' 3"  (1.6 m)   Wt 109 lb 9.6 oz (49.7 kg)   SpO2 96%   BMI 19.41 kg/m  , BMI Body mass index is 19.41 kg/m. GEN: Well nourished, well developed, in no acute distress  HEENT: normal  Neck: no JVD,  or masses.  Bilateral carotid bruits Cardiac: RRR; no  rubs, or gallops.  2/ 6 systolic murmur in the aortic area which is early peaking.  There is mild bilateral leg edema with significant tenderness. Respiratory:  clear to auscultation bilaterally,  normal work of breathing GI: soft, nontender, nondistended, + BS MS: no deformity or atrophy  Skin: warm and dry, no rash Neuro:  Strength and sensation are intact Psych: euthymic mood, full affect    EKG:  EKG is not ordered today.   Recent Labs: 09/19/2022: Magnesium 2.0 10/03/2022: ALT 22; BUN 13; Creatinine, Ser 1.08; Hemoglobin 13.1; Platelets 264; Potassium 3.9; Sodium 140; TSH 1.660    Lipid Panel    Component Value Date/Time   CHOL 162 03/21/2022 1006   TRIG 181 (H) 03/21/2022 1006   HDL 82 03/21/2022 1006   CHOLHDL 2.0 03/21/2022 1006   LDLCALC 51 03/21/2022 1006      Wt Readings from Last 3 Encounters:  03/19/23 109 lb 9.6 oz (49.7 kg)  01/02/23 107 lb (48.5 kg)  10/19/22 111 lb 9.6 oz (50.6 kg)            No data to display            ASSESSMENT AND PLAN:  1.  Renal artery stenosis: Status post balloon angioplasty of the left renal artery in-stent restenosis.  Most recent Doppler showed patent bilateral renal artery stents with no significant restenosis.  No need for repeat renal artery duplex given that testing over the years have showed patent stents.  2.  Carotid artery disease: Most recent carotid Doppler in April showed  chronically occluded left ICA with mild disease on the right side.  This can be monitored on a yearly basis.  3.  Hyperlipidemia: Currently on rosuvastatin 40 mg daily.  4.  Hypertension: Blood pressure has been running low and her antihypertensive medications were adjusted by her nephrologist.  5.  Chronic kidney disease: She is followed by nephrology with stable renal function.     Disposition:   FU with me as needed.  Signed,  Lorine Bears, MD  03/19/2023 11:08 AM    [facility]

## 2023-03-19 NOTE — Patient Instructions (Addendum)
 Medication Instructions:  No changes *If you need a refill on your cardiac medications before your next appointment, please call your pharmacy*   Lab Work: None ordered If you have labs (blood work) drawn today and your tests are completely normal, you will receive your results only by: MyChart Message (if you have MyChart) OR A paper copy in the mail If you have any lab test that is abnormal or we need to change your treatment, we will call you to review the results.   Testing/Procedures: None ordered   Follow-Up: At [facility], you and your health needs are our priority.  As part of our continuing mission to provide you with exceptional heart care, we have created designated Provider Care Teams.  These Care Teams include your primary Cardiologist (physician) and Advanced Practice Providers (APPs -  Physician Assistants and Nurse Practitioners) who all work together to provide you with the care you need, when you need it.  We recommend signing up for the patient portal called "MyChart".  Sign up information is provided on this After Visit Summary.  MyChart is used to connect with patients for Virtual Visits (Telemedicine).  Patients are able to view lab/test results, encounter notes, upcoming appointments, etc.  Non-urgent messages can be sent to your provider as well.   To learn more about what you can do with MyChart, go to [email or web address].    Your next appointment:   Follow up as needed

## 2023-03-21 ENCOUNTER — Other Ambulatory Visit: Payer: Self-pay | Admitting: *Deleted

## 2023-03-21 MED ORDER — ALENDRONATE SODIUM 70 MG PO TABS: ORAL_TABLET | ORAL | 1 refills | Status: DC

## 2023-04-23 ENCOUNTER — Telehealth: Payer: Self-pay

## 2023-04-23 NOTE — Telephone Encounter (Signed)
 Appt scheduled for labs 04/30/23 for Dr. Wolfgang Phoenix, pt has lab orders

## 2023-04-23 NOTE — Telephone Encounter (Signed)
 Copied from CRM [id number]. Topic: Appointments - Appointment Scheduling >> Apr 23, 2023  9:28 AM Pierre Bali B wrote: Patient/patient representative is calling to schedule an appointment. She needs to do lab work and would like to see if she can get in sometime next week to get that done if possible.  Refer to attachments for appointment information.

## 2023-04-30 ENCOUNTER — Other Ambulatory Visit

## 2023-04-30 DIAGNOSIS — R809 Proteinuria, unspecified: Secondary | ICD-10-CM | POA: Diagnosis not present

## 2023-04-30 DIAGNOSIS — E559 Vitamin D deficiency, unspecified: Secondary | ICD-10-CM | POA: Diagnosis not present

## 2023-04-30 DIAGNOSIS — D631 Anemia in chronic kidney disease: Secondary | ICD-10-CM | POA: Diagnosis not present

## 2023-04-30 DIAGNOSIS — N189 Chronic kidney disease, unspecified: Secondary | ICD-10-CM | POA: Diagnosis not present

## 2023-04-30 DIAGNOSIS — E211 Secondary hyperparathyroidism, not elsewhere classified: Secondary | ICD-10-CM | POA: Diagnosis not present

## 2023-05-01 ENCOUNTER — Ambulatory Visit: Attending: Cardiology

## 2023-05-01 DIAGNOSIS — I6523 Occlusion and stenosis of bilateral carotid arteries: Secondary | ICD-10-CM

## 2023-05-07 DIAGNOSIS — N280 Ischemia and infarction of kidney: Secondary | ICD-10-CM | POA: Diagnosis not present

## 2023-05-07 DIAGNOSIS — R809 Proteinuria, unspecified: Secondary | ICD-10-CM | POA: Diagnosis not present

## 2023-05-07 DIAGNOSIS — N1831 Chronic kidney disease, stage 3a: Secondary | ICD-10-CM | POA: Diagnosis not present

## 2023-05-07 DIAGNOSIS — I5032 Chronic diastolic (congestive) heart failure: Secondary | ICD-10-CM | POA: Diagnosis not present

## 2023-05-15 ENCOUNTER — Ambulatory Visit: Payer: Medicare Other | Attending: Cardiology | Admitting: Cardiology

## 2023-05-15 ENCOUNTER — Encounter: Payer: Self-pay | Admitting: Cardiology

## 2023-05-15 VITALS — BP 118/62 | HR 65 | Ht 63.5 in | Wt 109.6 lb

## 2023-05-15 DIAGNOSIS — I10 Essential (primary) hypertension: Secondary | ICD-10-CM

## 2023-05-15 DIAGNOSIS — I701 Atherosclerosis of renal artery: Secondary | ICD-10-CM | POA: Diagnosis not present

## 2023-05-15 DIAGNOSIS — I6523 Occlusion and stenosis of bilateral carotid arteries: Secondary | ICD-10-CM | POA: Diagnosis not present

## 2023-05-15 DIAGNOSIS — E782 Mixed hyperlipidemia: Secondary | ICD-10-CM

## 2023-05-15 NOTE — Patient Instructions (Signed)
 Medication Instructions:  Your physician recommends that you continue on your current medications as directed. Please refer to the Current Medication list given to you today.  *If you need a refill on your cardiac medications before your next appointment, please call your pharmacy*  Lab Work: NONE   If you have labs (blood work) drawn today and your tests are completely normal, you will receive your results only by: MyChart Message (if you have MyChart) OR A paper copy in the mail If you have any lab test that is abnormal or we need to change your treatment, we will call you to review the results.  Testing/Procedures: NONE   Follow-Up: At [facility], you and your health needs are our priority.  As part of our continuing mission to provide you with exceptional heart care, our providers are all part of one team.  This team includes your primary Cardiologist (physician) and Advanced Practice Providers or APPs (Physician Assistants and Nurse Practitioners) who all work together to provide you with the care you need, when you need it.  Your next appointment:   6 month(s)  Provider:   You may see Teddie Favre, MD or the following Advanced Practice Provider on your designated Care Team:   Lasalle Pointer, NP    We recommend signing up for the patient portal called "MyChart".  Sign up information is provided on this After Visit Summary.  MyChart is used to connect with patients for Virtual Visits (Telemedicine).  Patients are able to view lab/test results, encounter notes, upcoming appointments, etc.  Non-urgent messages can be sent to your provider as well.   To learn more about what you can do with MyChart, go to [email or web address].   Other Instructions Thank you for choosing [facility]!

## 2023-05-15 NOTE — Progress Notes (Signed)
    Cardiology Office Note  Date: 05/15/2023   ID: Tamara, King 1940/04/17, MRN [id number]  History of Present Illness: Tamara King is an 83 y.o. female last seen in September 2024.  She is here today for a follow-up visit.  She does not report any major change overall, no exertional chest pain, no palpitations or syncope.  We went over her medications.  She does not report any spontaneous bleeding problems on Plavix  and otherwise continues on stable antihypertensive and lipid-lowering regimen.  She had follow-up lab work last year showing LDL 51 and HDL 82.  Her most recent renal panel looked better.  Physical Exam: VS:  BP 118/62   Pulse 65   Ht 5' 3.5" (1.613 m)   Wt 109 lb 9.6 oz (49.7 kg)   SpO2 96%   BMI 19.11 kg/m , BMI Body mass index is 19.11 kg/m.  Wt Readings from Last 3 Encounters:  05/15/23 109 lb 9.6 oz (49.7 kg)  03/19/23 109 lb 9.6 oz (49.7 kg)  01/02/23 107 lb (48.5 kg)    General: Patient appears comfortable at rest. HEENT: Conjunctiva and lids normal. Neck: Supple, no elevated JVP, bilateral carotid bruits. Lungs: Clear to auscultation, nonlabored breathing at rest. Cardiac: Regular rate and rhythm, no S3, 2/6 systolic murmur.  ECG:  An ECG dated 09/19/2022 was personally reviewed today and demonstrated:  Sinus rhythm with prolonged PR interval and left bundle branch block.  Labwork: February 2024: Cholesterol 162, triglycerides 181, HDL 82, LDL 51 09/19/2022: Magnesium 2.0 10/03/2022: ALT 22; AST 31; BUN 13; Creatinine, Ser 1.08; Hemoglobin 13.1; Platelets 264; Potassium 3.9; Sodium 140; TSH 1.660   Other Studies Reviewed Today:  Carotid Dopplers 05/01/2023: Summary:  Right Carotid: Velocities in the right ICA are consistent with a 40-59%                 stenosis. The ECA appears <50% stenosed. Proximal common  carotid                artery >50%.   Left Carotid: Evidence consistent with a total occlusion of the left ICA.                 Hemodynamically significant plaque >50% visualized in the  CCA. The                ECA appears <50% stenosed.   Assessment and Plan:  1.  Bilateral carotid artery disease with known occlusion of the LICA and mild RICA stenosis by follow-up carotid Dopplers in April.  She is asymptomatic and continues on Plavix  75 mg daily and Crestor  40 mg daily.   2.  Bilateral renal artery stenosis status post previous stent intervention and angioplasty due to in-stent restenosis.  She follows with Dr. Alvenia Aus, I reviewed his note from February.  She has done well without restenosis by follow-up renal artery Dopplers in January.   3.  Primary hypertension.  Blood pressure well-controlled today.  Continue Norvasc  5 mg daily and Coreg  25 mg twice daily.   4.  History of nonobstructive coronary atherosclerosis as of 2003.  She remains asymptomatic.  5.  CKD stage IIIa, recent creatinine 1.03 with GFR 55.  Disposition:  Follow up  6 months.  Signed, Gerard Knight, M.D., F.A.C.C. [facility]

## 2023-05-17 ENCOUNTER — Other Ambulatory Visit: Payer: Self-pay | Admitting: *Deleted

## 2023-05-17 DIAGNOSIS — I6523 Occlusion and stenosis of bilateral carotid arteries: Secondary | ICD-10-CM

## 2023-07-01 ENCOUNTER — Ambulatory Visit (INDEPENDENT_AMBULATORY_CARE_PROVIDER_SITE_OTHER): Payer: Medicare Other

## 2023-07-01 VITALS — BP 118/62 | HR 65 | Ht 63.0 in | Wt 109.0 lb

## 2023-07-01 DIAGNOSIS — Z0000 Encounter for general adult medical examination without abnormal findings: Secondary | ICD-10-CM | POA: Diagnosis not present

## 2023-07-01 DIAGNOSIS — Z1231 Encounter for screening mammogram for malignant neoplasm of breast: Secondary | ICD-10-CM

## 2023-07-01 NOTE — Patient Instructions (Signed)
 Tamara King , Thank you for taking time out of your busy schedule to complete your Annual Wellness Visit with me. I enjoyed our conversation and look forward to speaking with you again next year. I, as well as your care team,  appreciate your ongoing commitment to your health goals. Please review the following plan we discussed and let me know if I can assist you in the future. Your Game plan/ To Do List    Follow up Visits: Next Medicare AWV with our clinical staff: 07/01/24 at 10:00a.m.   Next Office Visit with your provider: 07/03/23 at 10:05a.m.  Clinician Recommendations:  Aim for 30 minutes of exercise or brisk walking, 6-8 glasses of water, and 5 servings of fruits and vegetables each day. Please remember to discuss with your provider about getting the following update at your next visit: Mammogram/shingles vaccine.      This is a list of the screening recommended for you and due dates:  Health Maintenance  Topic Date Due   Zoster (Shingles) Vaccine (2 of 2) 04/12/2021   Mammogram  03/03/2022   COVID-19 Vaccine (4 - 2024-25 season) 09/23/2022   Medicare Annual Wellness Visit  06/29/2023   Flu Shot  08/23/2023   DEXA scan (bone density measurement)  12/14/2023   DTaP/Tdap/Td vaccine (3 - Td or Tdap) 02/02/2027   Pneumonia Vaccine  Completed   HPV Vaccine  Aged Out   Meningitis B Vaccine  Aged Out   Colon Cancer Screening  Discontinued   Hepatitis C Screening  Discontinued    Advanced directives: (Declined) Advance directive discussed with you today. Even though you declined this today, please call our office should you change your mind, and we can give you the proper paperwork for you to fill out. Advance Care Planning is important because it:  [x]  Makes sure you receive the medical care that is consistent with your values, goals, and preferences  [x]  It provides guidance to your family and loved ones and reduces their decisional burden about whether or not they are making the right  decisions based on your wishes.  Follow the link provided in your after visit summary or read over the paperwork we have mailed to you to help you started getting your Advance Directives in place. If you need assistance in completing these, please reach out to us  so that we can help you!  See attachments for Preventive Care and Fall Prevention Tips.

## 2023-07-01 NOTE — Progress Notes (Signed)
 Subjective:   Tamara King is a 83 y.o. who presents for a Medicare Wellness preventive visit.  As a reminder, Annual Wellness Visits don't include a physical exam, and some assessments may be limited, especially if this visit is performed virtually. We may recommend an in-person follow-up visit with your provider if needed.  Visit Complete: Virtual I connected with  Tamara King on 07/01/23 by a audio enabled telemedicine application and verified that I am speaking with the correct person using two identifiers.  Patient Location: Home  Provider Location: Home Office  I discussed the limitations of evaluation and management by telemedicine. The patient expressed understanding and agreed to proceed.  Vital Signs: Because this visit was a virtual/telehealth visit, some criteria may be missing or patient reported. Any vitals not documented were not able to be obtained and vitals that have been documented are patient reported.  VideoDeclined- This patient declined Librarian, academic. Therefore the visit was completed with audio only.  Persons Participating in Visit: Patient.  AWV Questionnaire: No: Patient Medicare AWV questionnaire was not completed prior to this visit.  Cardiac Risk Factors include: advanced age (>39men, >8 women);dyslipidemia     Objective:     Today's Vitals   07/01/23 1109 07/01/23 1111  BP: 118/62   Pulse: 65   Weight: 109 lb (49.4 kg)   Height: 5\' 3"  (1.6 m)   PainSc:  4    Body mass index is 19.31 kg/m.     07/01/2023   11:33 AM 06/29/2022    8:24 AM 06/27/2021    8:27 AM 06/24/2020    8:11 AM 06/24/2019    8:48 AM 05/20/2019    8:20 AM 11/28/2017   12:00 AM  Advanced Directives  Does Patient Have a Medical Advance Directive? Yes Yes No No No No No  Type of Advance Directive Living will Healthcare Power of [place];Living will       Copy of Healthcare Power of Attorney in Chart?  No - copy requested       Would patient  like information on creating a medical advance directive?   No - Patient declined No - Patient declined No - Patient declined No - Patient declined     Current Medications (verified) Outpatient Encounter Medications as of 07/01/2023  Medication Sig   acetaminophen  (TYLENOL ) 500 MG tablet Take 500 mg by mouth every 6 (six) hours as needed for moderate pain.   alendronate  (FOSAMAX ) 70 MG tablet TAKE 1 TABLET BY MOUTH ONCE A WEEK WITH A FULL GLASS OF WATER ON AN EMPTY STOMACH   amLODipine  (NORVASC ) 5 MG tablet Take 1 tablet (5 mg total) by mouth daily.   carvedilol  (COREG ) 25 MG tablet Take 1 tablet (25 mg total) by mouth 2 (two) times daily.   Cholecalciferol (VITAMIN D3) 25 MCG (1000 UT) CAPS Take 1 capsule by mouth daily.   clopidogrel  (PLAVIX ) 75 MG tablet Take 1 tablet by mouth once daily   EQ ALLERGY RELIEF, CETIRIZINE , 10 MG tablet Take 1 tablet by mouth once daily   famotidine  (PEPCID ) 20 MG tablet Take 1 tablet (20 mg total) by mouth daily.   furosemide  (LASIX ) 40 MG tablet Take 1 tablet (40 mg total) by mouth 2 (two) times daily.   ondansetron  (ZOFRAN ) 4 MG tablet Take 1 tablet (4 mg total) by mouth every 8 (eight) hours as needed for nausea or vomiting.   pantoprazole  (PROTONIX ) 40 MG tablet Take 1 tablet by mouth once daily  potassium chloride  SA (KLOR-CON  M) 20 MEQ tablet Take 1 tablet (20 mEq total) by mouth 2 (two) times daily.   rosuvastatin  (CRESTOR ) 40 MG tablet Take 1 tablet (40 mg total) by mouth daily.   No facility-administered encounter medications on file as of 07/01/2023.    Allergies (verified) Losartan, Sulfa antibiotics, Codeine, Fish allergy, Other, Spironolactone , Vancomycin, and Penicillins   History: Past Medical History:  Diagnosis Date   Breast cancer (HCC)    Adenocarcinoma   Chronic edema    CKD (chronic kidney disease) stage 3, GFR 30-59 ml/min (HCC) 10/22/2018   Coronary atherosclerosis of native coronary artery    Nonobstructive at catheterization  2003, LVEF normal    Essential hypertension    GERD (gastroesophageal reflux disease) 07/06/2013   Idiopathic peripheral neuropathy 10/06/2013   Mixed hyperlipidemia    Occlusion and stenosis of left carotid artery 10/22/2017   Renal artery stenosis (HCC)    BMS bilateally 2003, PTCA bilaterally 2005 with restenosis   Sleep apnea    Stop Bang score of 4   Stroke [facility])    Symptomatic varicose veins, right 10/16/2018   Past Surgical History:  Procedure Laterality Date   APPENDECTOMY     CATARACT EXTRACTION W/PHACO  03/29/2011   Procedure: CATARACT EXTRACTION PHACO AND INTRAOCULAR LENS PLACEMENT (IOC);  Surgeon: Anner Kill, MD;  Location: [facility];  Service: Ophthalmology;  Laterality: Right;  CDE:12.82   CATARACT EXTRACTION W/PHACO  04/16/2011   Procedure: CATARACT EXTRACTION PHACO AND INTRAOCULAR LENS PLACEMENT (IOC);  Surgeon: Anner Kill, MD;  Location: [facility];  Service: Ophthalmology;  Laterality: Left;  CDE: 11.54   CESAREAN SECTION     x2   CHOLECYSTECTOMY  2018   HEMORRHOID SURGERY     RENAL ANGIOGRAPHY N/A 05/20/2019   Procedure: RENAL ANGIOGRAPHY;  Surgeon: Wenona Hamilton, MD;  Location: [facility];  Service: Cardiovascular;  Laterality: N/A;   RENAL INTERVENTION  05/20/2019   Procedure: RENAL INTERVENTION;  Surgeon: Wenona Hamilton, MD;  Location: [facility];  Service: Cardiovascular;;   Right breast lumpectomy     ROTATOR CUFF REPAIR  2008   UPPER EXTREMITY ANGIOGRAPHY N/A 11/27/2017   Procedure: UPPER EXTREMITY ANGIOGRAPHY;  Surgeon: Young Hensen, MD;  Location: [facility];  Service: Cardiovascular;  Laterality: N/A;   Family History  Problem Relation Age of Onset   Heart failure Mother    Stroke Mother 67   Lung cancer Father    Diabetes Father    Diabetes Sister    Brain cancer Sister    COPD Sister    Brain cancer Brother    Diabetes Brother    Diabetes Brother    Diabetes Son    Thyroid  cancer Son    Cancer Son    Diabetes Son     Anesthesia problems Neg Hx    Hypotension Neg Hx    Malignant hyperthermia Neg Hx    Pseudochol deficiency Neg Hx    Social History   Socioeconomic History   Marital status: Widowed    Spouse name: Not on file   Number of children: 2   Years of education: 12th   Highest education level: 12th grade  Occupational History    Employer: RETIRED    Comment: n/a  Tobacco Use   Smoking status: Former    Current packs/day: 0.00    Average packs/day: 1 pack/day for 35.0 years (35.0 ttl pk-yrs)    Types: Cigarettes    Start date:  01/23/1963    Quit date: 01/22/1998    Years since quitting: 25.4   Smokeless tobacco: Never   Tobacco comments:    tobacco use - no  Vaping Use   Vaping status: Never Used  Substance and Sexual Activity   Alcohol use: No   Drug use: No   Sexual activity: Not Currently    Partners: Male    Comment: widowed  Other Topics Concern   Not on file  Social History Narrative   Patient lives at home with family.  Two sons and two granddaughters live with her.   Caffeine use; 2-5 cups daily   Oldest son has cancer   Social Drivers of Corporate investment banker Strain: Low Risk  (07/01/2023)   Overall Financial Resource Strain (CARDIA)    Difficulty of Paying Living Expenses: Not hard at all  Food Insecurity: No Food Insecurity (07/01/2023)   Hunger Vital Sign    Worried About Running Out of Food in the Last Year: Never true    Ran Out of Food in the Last Year: Never true  Transportation Needs: No Transportation Needs (07/01/2023)   PRAPARE - Administrator, Civil Service (Medical): No    Lack of Transportation (Non-Medical): No  Physical Activity: Insufficiently Active (06/29/2022)   Exercise Vital Sign    Days of Exercise per Week: 3 days    Minutes of Exercise per Session: 30 min  Stress: No Stress Concern Present (07/01/2023)   [employer] of Occupational Health - Occupational Stress Questionnaire    Feeling of Stress : Only a little  Social  Connections: Socially Isolated (07/01/2023)   Social Connection and Isolation Panel [NHANES]    Frequency of Communication with Friends and Family: Twice a week    Frequency of Social Gatherings with Friends and Family: Twice a week    Attends Religious Services: Never    Database administrator or Organizations: No    Attends Banker Meetings: Never    Marital Status: Widowed    Tobacco Counseling Counseling given: Yes Tobacco comments: tobacco use - no    Clinical Intake:  Pre-visit preparation completed: Yes  Pain : 0-10 (R-shoulder) Pain Score: 4  Pain Type: Chronic pain Pain Location: Shoulder Pain Orientation: Right Pain Descriptors / Indicators: Aching Pain Onset: Other (comment) Pain Frequency: Intermittent Pain Relieving Factors: tylenol  Effect of Pain on Daily Activities: no  Pain Relieving Factors: tylenol   BMI - recorded: 19.31 Nutritional Status: BMI of 19-24  Normal Nutritional Risks: None Diabetes: No  No results found for: "HGBA1C"   How often do you need to have someone help you when you read instructions, pamphlets, or other written materials from your doctor or pharmacy?: 1 - Never  Interpreter Needed?: No  Information entered by :: Alia t/cma   Activities of Daily Living     07/01/2023   11:26 AM  In your present state of health, do you have any difficulty performing the following activities:  Hearing? 0  Vision? 1  Difficulty concentrating or making decisions? 1  Comment remembering things  Walking or climbing stairs? 0  Dressing or bathing? 0  Doing errands, shopping? 1  Comment pt's husband's sister-in Dispensing optician and eating ? N  Using the Toilet? N  In the past six months, have you accidently leaked urine? N  Do you have problems with loss of bowel control? N  Managing your Medications? N  Managing your Finances? N  Housekeeping or  managing your Housekeeping? N    Patient Care Team: Galvin Jules, FNP as  PCP - General (Family Medicine) Gerard Knight, MD as PCP - Cardiology (Cardiology) Jane Meager, MD as Consulting Physician (Nephrology) Wenona Hamilton, MD as Consulting Physician (Cardiology) Delilah Fend, [facility] as [facility] Care Management (Pharmacist) Wenona Hamilton, MD as Consulting Physician (Cardiology) Alexia Idler, OD (Optometry)  I have updated your Care Teams any recent Medical Services you may have received from other providers in the past year.     Assessment:    This is a routine wellness examination for Tamara King.  Hearing/Vision screen Hearing Screening - Comments:: Pt denies hearing dif Vision Screening - Comments:: Pt wear glasses and still have issues l-eye/pt goes to [facility] In [place]/last apt last year. Per pt has an appt coming up soon   Goals Addressed             This Visit's Progress    Patient Stated       Pt would like to go to the beach       Depression Screen     07/01/2023   11:40 AM 06/29/2022    8:23 AM 03/21/2022    9:55 AM 06/27/2021    8:25 AM 06/15/2021   10:10 AM 03/29/2021    8:53 AM 11/15/2020   10:37 AM  PHQ 2/9 Scores  PHQ - 2 Score 6 0 0 2  0 0  PHQ- 9 Score 13  0 6   0  Exception Documentation     Patient refusal      Fall Risk     07/01/2023   11:19 AM 06/29/2022    8:22 AM 03/21/2022    9:55 AM 06/27/2021    8:20 AM 06/15/2021   10:10 AM  Fall Risk   Falls in the past year? 0 0 0 1 0  Number falls in past yr: 0 0  0 0  Injury with Fall? 0 0  1 1  Risk for fall due to : No Fall Risks No Fall Risks  History of fall(s);Orthopedic patient   Follow up Falls evaluation completed Falls prevention discussed  Education provided;Falls prevention discussed Falls prevention discussed    MEDICARE RISK AT HOME:  Medicare Risk at Home Any stairs in or around the home?: Yes If so, are there any without handrails?: Yes Home free of loose throw rugs in walkways, pet beds, electrical cords, etc?:  Yes Adequate lighting in your home to reduce risk of falls?: Yes Life alert?: No Use of a cane, walker or w/c?: Yes Grab bars in the bathroom?: Yes Shower chair or bench in shower?: Yes Elevated toilet seat or a handicapped toilet?: No  TIMED UP AND GO:  Was the test performed?  no  Cognitive Function: 6CIT completed        07/01/2023   11:48 AM 06/29/2022    8:25 AM 06/27/2021    8:29 AM 06/24/2020    8:13 AM 06/24/2019    8:56 AM  6CIT Screen  What Year? 0 points 0 points 0 points 0 points 0 points  What month? 0 points 0 points 0 points 0 points 0 points  What time? 0 points 0 points 0 points 0 points 0 points  Count back from 20 0 points 0 points 0 points 0 points 0 points  Months in reverse 0 points 0 points 0 points 0 points 0 points  Repeat phrase 0 points 0 points 2  points 0 points 0 points  Total Score 0 points 0 points 2 points 0 points 0 points    Immunizations Immunization History  Administered Date(s) Administered   Fluad Quad(high Dose 65+) 11/11/2015, 12/05/2015, 10/20/2019, 11/15/2020, 12/13/2021   Fluad Trivalent(High Dose 65+) 10/03/2022   Influenza, High Dose Seasonal PF 12/07/2013, 11/01/2014, 11/11/2015, 12/05/2015, 10/23/2016, 10/07/2018   Influenza,trivalent, recombinat, inj, PF 12/07/2013, 10/23/2016   Moderna Sars-Covid-2 Vaccination 04/20/2019, 05/18/2019, 01/13/2020   Pneumococcal Conjugate-13 01/14/2019   Pneumococcal Polysaccharide-23 10/22/2013   Tdap 07/06/2013, 02/01/2017   Zoster Recombinant(Shingrix) 02/15/2021    Screening Tests Health Maintenance  Topic Date Due   Zoster Vaccines- Shingrix (2 of 2) 04/12/2021   MAMMOGRAM  03/03/2022   COVID-19 Vaccine (4 - 2024-25 season) 07/16/2024 (Originally 09/23/2022)   INFLUENZA VACCINE  08/23/2023   DEXA SCAN  12/14/2023   Medicare Annual Wellness (AWV)  06/30/2024   DTaP/Tdap/Td (3 - Td or Tdap) 02/02/2027   Pneumonia Vaccine 67+ Years old  Completed   HPV VACCINES  Aged Out   Meningococcal B  Vaccine  Aged Out   Colonoscopy  Discontinued   Hepatitis C Screening  Discontinued    Health Maintenance  Health Maintenance Due  Topic Date Due   Zoster Vaccines- Shingrix (2 of 2) 04/12/2021   MAMMOGRAM  03/03/2022   Health Maintenance Items Addressed: Mammogram ordered  Additional Screening:  Vision Screening: Recommended annual ophthalmology exams for early detection of glaucoma and other disorders of the eye. Would you like a referral to an eye doctor? No    Dental Screening: Recommended annual dental exams for proper oral hygiene  Community Resource Referral / Chronic Care Management: CRR required this visit?  No   CCM required this visit?  No   Plan:    I have personally reviewed and noted the following in the patient's chart:   Medical and social history Use of alcohol, tobacco or illicit drugs  Current medications and supplements including opioid prescriptions. Patient is not currently taking opioid prescriptions. Functional ability and status Nutritional status Physical activity Advanced directives List of other physicians Hospitalizations, surgeries, and ER visits in previous 12 months Vitals Screenings to include cognitive, depression, and falls Referrals and appointments  In addition, I have reviewed and discussed with patient certain preventive protocols, quality metrics, and best practice recommendations. A written personalized care plan for preventive services as well as general preventive health recommendations were provided to patient.   Michaelle Adolphus, CMA   07/01/2023   After Visit Summary: (Declined) Due to this being a telephonic visit, with patients personalized plan was offered to patient but patient Declined AVS at this time   Notes: Please refer to Routing Comments.

## 2023-07-02 ENCOUNTER — Encounter: Payer: Self-pay | Admitting: Nurse Practitioner

## 2023-07-03 ENCOUNTER — Other Ambulatory Visit: Payer: Self-pay

## 2023-07-03 ENCOUNTER — Encounter: Payer: Self-pay | Admitting: Family Medicine

## 2023-07-03 ENCOUNTER — Ambulatory Visit (INDEPENDENT_AMBULATORY_CARE_PROVIDER_SITE_OTHER): Payer: Medicare Other | Admitting: Family Medicine

## 2023-07-03 VITALS — BP 139/68 | HR 56 | Temp 97.2°F | Ht 63.0 in | Wt 108.2 lb

## 2023-07-03 DIAGNOSIS — I701 Atherosclerosis of renal artery: Secondary | ICD-10-CM

## 2023-07-03 DIAGNOSIS — I10 Essential (primary) hypertension: Secondary | ICD-10-CM

## 2023-07-03 DIAGNOSIS — N1832 Chronic kidney disease, stage 3b: Secondary | ICD-10-CM

## 2023-07-03 DIAGNOSIS — Z8673 Personal history of transient ischemic attack (TIA), and cerebral infarction without residual deficits: Secondary | ICD-10-CM | POA: Diagnosis not present

## 2023-07-03 DIAGNOSIS — E559 Vitamin D deficiency, unspecified: Secondary | ICD-10-CM

## 2023-07-03 DIAGNOSIS — K219 Gastro-esophageal reflux disease without esophagitis: Secondary | ICD-10-CM | POA: Diagnosis not present

## 2023-07-03 DIAGNOSIS — R600 Localized edema: Secondary | ICD-10-CM | POA: Diagnosis not present

## 2023-07-03 DIAGNOSIS — Z23 Encounter for immunization: Secondary | ICD-10-CM | POA: Diagnosis not present

## 2023-07-03 DIAGNOSIS — E782 Mixed hyperlipidemia: Secondary | ICD-10-CM | POA: Diagnosis not present

## 2023-07-03 DIAGNOSIS — M810 Age-related osteoporosis without current pathological fracture: Secondary | ICD-10-CM | POA: Diagnosis not present

## 2023-07-03 DIAGNOSIS — N63 Unspecified lump in unspecified breast: Secondary | ICD-10-CM

## 2023-07-03 DIAGNOSIS — R110 Nausea: Secondary | ICD-10-CM

## 2023-07-03 DIAGNOSIS — I6522 Occlusion and stenosis of left carotid artery: Secondary | ICD-10-CM | POA: Diagnosis not present

## 2023-07-03 MED ORDER — FUROSEMIDE 40 MG PO TABS: 40.0000 mg | ORAL_TABLET | Freq: Two times a day (BID) | ORAL | 1 refills | Status: DC

## 2023-07-03 MED ORDER — ONDANSETRON HCL 4 MG PO TABS: 4.0000 mg | ORAL_TABLET | Freq: Three times a day (TID) | ORAL | 0 refills | Status: DC | PRN

## 2023-07-03 MED ORDER — AMLODIPINE BESYLATE 5 MG PO TABS: 5.0000 mg | ORAL_TABLET | Freq: Every day | ORAL | 1 refills | Status: DC

## 2023-07-03 MED ORDER — FAMOTIDINE 20 MG PO TABS: 20.0000 mg | ORAL_TABLET | Freq: Every day | ORAL | 1 refills | Status: DC

## 2023-07-03 MED ORDER — POTASSIUM CHLORIDE CRYS ER 20 MEQ PO TBCR: 20.0000 meq | EXTENDED_RELEASE_TABLET | Freq: Two times a day (BID) | ORAL | 1 refills | Status: DC

## 2023-07-03 MED ORDER — CLOPIDOGREL BISULFATE 75 MG PO TABS: 75.0000 mg | ORAL_TABLET | Freq: Every day | ORAL | 1 refills | Status: DC

## 2023-07-03 MED ORDER — CARVEDILOL 25 MG PO TABS: 25.0000 mg | ORAL_TABLET | Freq: Two times a day (BID) | ORAL | 1 refills | Status: DC

## 2023-07-03 MED ORDER — ROSUVASTATIN CALCIUM 40 MG PO TABS: 40.0000 mg | ORAL_TABLET | Freq: Every day | ORAL | 1 refills | Status: DC

## 2023-07-03 MED ORDER — PANTOPRAZOLE SODIUM 40 MG PO TBEC: DELAYED_RELEASE_TABLET | ORAL | 1 refills | Status: DC

## 2023-07-03 NOTE — Progress Notes (Signed)
 Subjective:  Patient ID: Tamara King, female    DOB: [birth year], 83 y.o.   MRN: [id number]  Patient Care Team: Galvin Jules, FNP as PCP - General (Family Medicine) Gerard Knight, MD as PCP - Cardiology (Cardiology) Jane Meager, MD as Consulting Physician (Nephrology) Wenona Hamilton, MD as Consulting Physician (Cardiology) Delilah Fend, [facility] as [facility] Care Management (Pharmacist) Wenona Hamilton, MD as Consulting Physician (Cardiology) Alexia Idler, OD (Optometry)   Chief Complaint:  Medical Management of Chronic Issues (6 month chronic check up ) and Breast Mass (States she noticed some knots on her left breast x 3 months )   HPI: Tamara King is a 83 y.o. female presenting on 07/03/2023 for Medical Management of Chronic Issues (6 month chronic check up ) and Breast Mass (States she noticed some knots on her left breast x 3 months )   ADALEEN King is an 83 year old female who presents for chronic follow up. She reports she has lumps in her left breast.  She has noticed two lumps in her left breast since March 2025, one described as 'standing in' and the other as a 'little' lump. These lumps have been present for at least three months and cause mild pain. No recent mammogram has been performed.  She is here for a regular follow-up and requires medication refills. She is currently taking Crestor  for hyperlipidemia without significant side effects, such as muscle aches. She also takes Fosamax  for osteoporosis and reports issues with her teeth breaking off due to caries, but no loose teeth or bleeding gums. She is on amlodipine , carvedilol , and furosemide  for hypertension and notes recent leg swelling. She has a history of stroke and renal artery stenosis and is taking Plavix . No changes in stool color, abnormal bleeding, or bruising, except for some gingival bleeding.  She experiences nausea at various times of the day and uses Zofran  as  needed. She reports a history of weight loss, now weighing 108 pounds, but states this has been stable for a while. No night sweats or significant weight loss recently. She does not use compression socks due to discomfort.        Relevant past medical, surgical, family, and social history reviewed and updated as indicated.  Allergies and medications reviewed and updated. Data reviewed: Chart in Epic.   Past Medical History:  Diagnosis Date   Breast cancer (HCC)    Adenocarcinoma   Chronic edema    CKD (chronic kidney disease) stage 3, GFR 30-59 ml/min (HCC) 10/22/2018   Coronary atherosclerosis of native coronary artery    Nonobstructive at catheterization 2003, LVEF normal    Essential hypertension    GERD (gastroesophageal reflux disease) 07/06/2013   Idiopathic peripheral neuropathy 10/06/2013   Mixed hyperlipidemia    Occlusion and stenosis of left carotid artery 10/22/2017   Renal artery stenosis (HCC)    BMS bilateally 2003, PTCA bilaterally 2005 with restenosis   Sleep apnea    Stop Bang score of 4   Stroke [facility])    Symptomatic varicose veins, right 10/16/2018    Past Surgical History:  Procedure Laterality Date   APPENDECTOMY     CATARACT EXTRACTION W/PHACO  03/29/2011   Procedure: CATARACT EXTRACTION PHACO AND INTRAOCULAR LENS PLACEMENT (IOC);  Surgeon: Anner Kill, MD;  Location: [facility] ORS;  Service: Ophthalmology;  Laterality: Right;  CDE:12.82   CATARACT EXTRACTION W/PHACO  04/16/2011   Procedure: CATARACT EXTRACTION PHACO AND INTRAOCULAR LENS  PLACEMENT (IOC);  Surgeon: Anner Kill, MD;  Location: [facility] ORS;  Service: Ophthalmology;  Laterality: Left;  CDE: 11.54   CESAREAN SECTION     x2   CHOLECYSTECTOMY  2018   HEMORRHOID SURGERY     RENAL ANGIOGRAPHY N/A 05/20/2019   Procedure: RENAL ANGIOGRAPHY;  Surgeon: Wenona Hamilton, MD;  Location: [facility] INVASIVE CV LAB;  Service: Cardiovascular;  Laterality: N/A;   RENAL INTERVENTION  05/20/2019   Procedure: RENAL INTERVENTION;   Surgeon: Wenona Hamilton, MD;  Location: [facility] INVASIVE CV LAB;  Service: Cardiovascular;;   Right breast lumpectomy     ROTATOR CUFF REPAIR  2008   UPPER EXTREMITY ANGIOGRAPHY N/A 11/27/2017   Procedure: UPPER EXTREMITY ANGIOGRAPHY;  Surgeon: Young Hensen, MD;  Location: [facility] INVASIVE CV LAB;  Service: Cardiovascular;  Laterality: N/A;    Social History   Socioeconomic History   Marital status: Widowed    Spouse name: Not on file   Number of children: 2   Years of education: 12th   Highest education level: 12th grade  Occupational History    Employer: RETIRED    Comment: n/a  Tobacco Use   Smoking status: Former    Current packs/day: 0.00    Average packs/day: 1 pack/day for 35.0 years (35.0 ttl pk-yrs)    Types: Cigarettes    Start date: 01/23/1963    Quit date: 01/22/1998    Years since quitting: 25.4   Smokeless tobacco: Never   Tobacco comments:    tobacco use - no  Vaping Use   Vaping status: Never Used  Substance and Sexual Activity   Alcohol use: No   Drug use: No   Sexual activity: Not Currently    Partners: Male    Comment: widowed  Other Topics Concern   Not on file  Social History Narrative   Patient lives at home with family.  Two sons and two granddaughters live with her.   Caffeine use; 2-5 cups daily   Oldest son has cancer   Social Drivers of Corporate investment banker Strain: Low Risk  (07/01/2023)   Overall Financial Resource Strain (CARDIA)    Difficulty of Paying Living Expenses: Not hard at all  Food Insecurity: No Food Insecurity (07/01/2023)   Hunger Vital Sign    Worried About Running Out of Food in the Last Year: Never true    Ran Out of Food in the Last Year: Never true  Transportation Needs: No Transportation Needs (07/01/2023)   PRAPARE - Administrator, Civil Service (Medical): No    Lack of Transportation (Non-Medical): No  Physical Activity: Insufficiently Active (06/29/2022)   Exercise Vital Sign    Days of Exercise per  Week: 3 days    Minutes of Exercise per Session: 30 min  Stress: No Stress Concern Present (07/01/2023)   [employer] of Occupational Health - Occupational Stress Questionnaire    Feeling of Stress : Only a little  Social Connections: Socially Isolated (07/01/2023)   Social Connection and Isolation Panel [NHANES]    Frequency of Communication with Friends and Family: Twice a week    Frequency of Social Gatherings with Friends and Family: Twice a week    Attends Religious Services: Never    Database administrator or Organizations: No    Attends Banker Meetings: Never    Marital Status: Widowed  Intimate Partner Violence: Not At Risk (07/01/2023)   Humiliation, Afraid, Rape, and Kick questionnaire    Fear  of Current or Ex-Partner: No    Emotionally Abused: No    Physically Abused: No    Sexually Abused: No    Outpatient Encounter Medications as of 07/03/2023  Medication Sig   acetaminophen  (TYLENOL ) 500 MG tablet Take 500 mg by mouth every 6 (six) hours as needed for moderate pain.   alendronate  (FOSAMAX ) 70 MG tablet TAKE 1 TABLET BY MOUTH ONCE A WEEK WITH A FULL GLASS OF WATER ON AN EMPTY STOMACH   Cholecalciferol (VITAMIN D3) 25 MCG (1000 UT) CAPS Take 1 capsule by mouth daily.   EQ ALLERGY RELIEF, CETIRIZINE , 10 MG tablet Take 1 tablet by mouth once daily   [DISCONTINUED] amLODipine  (NORVASC ) 5 MG tablet Take 1 tablet (5 mg total) by mouth daily.   [DISCONTINUED] carvedilol  (COREG ) 25 MG tablet Take 1 tablet (25 mg total) by mouth 2 (two) times daily.   [DISCONTINUED] clopidogrel  (PLAVIX ) 75 MG tablet Take 1 tablet by mouth once daily   [DISCONTINUED] famotidine  (PEPCID ) 20 MG tablet Take 1 tablet (20 mg total) by mouth daily.   [DISCONTINUED] furosemide  (LASIX ) 40 MG tablet Take 1 tablet (40 mg total) by mouth 2 (two) times daily.   [DISCONTINUED] ondansetron  (ZOFRAN ) 4 MG tablet Take 1 tablet (4 mg total) by mouth every 8 (eight) hours as needed for nausea or  vomiting.   [DISCONTINUED] pantoprazole  (PROTONIX ) 40 MG tablet Take 1 tablet by mouth once daily   [DISCONTINUED] potassium chloride  SA (KLOR-CON  M) 20 MEQ tablet Take 1 tablet (20 mEq total) by mouth 2 (two) times daily.   [DISCONTINUED] rosuvastatin  (CRESTOR ) 40 MG tablet Take 1 tablet (40 mg total) by mouth daily.   amLODipine  (NORVASC ) 5 MG tablet Take 1 tablet (5 mg total) by mouth daily.   carvedilol  (COREG ) 25 MG tablet Take 1 tablet (25 mg total) by mouth 2 (two) times daily.   clopidogrel  (PLAVIX ) 75 MG tablet Take 1 tablet (75 mg total) by mouth daily.   famotidine  (PEPCID ) 20 MG tablet Take 1 tablet (20 mg total) by mouth daily.   furosemide  (LASIX ) 40 MG tablet Take 1 tablet (40 mg total) by mouth 2 (two) times daily.   ondansetron  (ZOFRAN ) 4 MG tablet Take 1 tablet (4 mg total) by mouth every 8 (eight) hours as needed for nausea or vomiting.   pantoprazole  (PROTONIX ) 40 MG tablet Take 1 tablet by mouth once daily   potassium chloride  SA (KLOR-CON  M) 20 MEQ tablet Take 1 tablet (20 mEq total) by mouth 2 (two) times daily.   rosuvastatin  (CRESTOR ) 40 MG tablet Take 1 tablet (40 mg total) by mouth daily.   No facility-administered encounter medications on file as of 07/03/2023.    Allergies  Allergen Reactions   Losartan Anaphylaxis   Sulfa Antibiotics Anaphylaxis and Swelling   Codeine Nausea And Vomiting   Fish Allergy Other (See Comments)    sick as a dog   Other Other (See Comments)    Bologna - vomiting   Spironolactone  Other (See Comments)    Acute kidney injury   Vancomycin Other (See Comments)    drives me crazy   Penicillins Rash    Pertinent ROS per HPI, otherwise unremarkable      Objective:  BP 139/68   Pulse (!) 56   Temp (!) 97.2 F (36.2 C)   Ht 5' 3 (1.6 m)   Wt 108 lb 3.2 oz (49.1 kg)   SpO2 93%   BMI 19.17 kg/m    Wt Readings from Last 3  Encounters:  07/03/23 108 lb 3.2 oz (49.1 kg)  07/01/23 109 lb (49.4 kg)  05/15/23 109 lb 9.6 oz  (49.7 kg)    Physical Exam Vitals and nursing note reviewed. Exam conducted with a chaperone present.  Constitutional:      Appearance: She is normal weight. She is ill-appearing (chronically ill, frail elderly).  HENT:     Head: Normocephalic and atraumatic.     Jaw: There is normal jaw occlusion. No trismus, tenderness, swelling, pain on movement or malocclusion.     Nose: Nose normal.     Mouth/Throat:     Lips: Pink.     Mouth: Mucous membranes are moist.     Dentition: Dental caries present. No gingival swelling or gum lesions.  Eyes:     Conjunctiva/sclera: Conjunctivae normal.     Pupils: Pupils are equal, round, and reactive to light.  Neck:     Vascular: Decreased carotid pulses. Carotid bruit present.  Cardiovascular:     Rate and Rhythm: Normal rate and regular rhythm.     Heart sounds: Normal heart sounds.     Comments: RLE with minimal swelling and hemosiderin staining.  Pulmonary:     Effort: Pulmonary effort is normal.     Breath sounds: Normal breath sounds.  Chest:  Breasts:    Tanner Score is 5.     Left: Mass and tenderness present.    Musculoskeletal:     Cervical back: Neck supple.     Right lower leg: 1+ Edema present.     Left lower leg: No edema.  Skin:    General: Skin is warm and dry.     Capillary Refill: Capillary refill takes less than 2 seconds.  Neurological:     General: No focal deficit present.     Mental Status: She is alert and oriented to person, place, and time.     Gait: Gait abnormal (slow).  Psychiatric:        Mood and Affect: Mood normal.        Behavior: Behavior normal. Behavior is cooperative.        Thought Content: Thought content normal.        Judgment: Judgment normal.    BREAST: Left breast with two palpable lumps, one larger and one smaller. Right breast with no new lumps detected.        Results for orders placed or performed in visit on 10/03/22  CMP14+EGFR   Collection Time: 10/03/22 10:22 AM  Result  Value Ref Range   Glucose 112 (H) 70 - 99 mg/dL   BUN 13 8 - 27 mg/dL   Creatinine, Ser 1.61 (H) 0.57 - 1.00 mg/dL   eGFR 52 (L) >09 UE/AVW/0.98   BUN/Creatinine Ratio 12 12 - 28   Sodium 140 134 - 144 mmol/L   Potassium 3.9 3.5 - 5.2 mmol/L   Chloride 101 96 - 106 mmol/L   CO2 22 20 - 29 mmol/L   Calcium  9.5 8.7 - 10.3 mg/dL   Total Protein 7.1 6.0 - 8.5 g/dL   Albumin 4.6 3.8 - 4.8 g/dL   Globulin, Total 2.5 1.5 - 4.5 g/dL   Bilirubin Total 0.5 0.0 - 1.2 mg/dL   Alkaline Phosphatase 59 44 - 121 IU/L   AST 31 0 - 40 IU/L   ALT 22 0 - 32 IU/L  CBC with Differential/Platelet   Collection Time: 10/03/22 10:22 AM  Result Value Ref Range   WBC 7.9 3.4 - 10.8 x10E3/uL  RBC 4.26 3.77 - 5.28 x10E6/uL   Hemoglobin 13.1 11.1 - 15.9 g/dL   Hematocrit 86.5 78.4 - 46.6 %   MCV 93 79 - 97 fL   MCH 30.8 26.6 - 33.0 pg   MCHC 33.2 31.5 - 35.7 g/dL   RDW 69.6 29.5 - 28.4 %   Platelets 264 150 - 450 x10E3/uL   Neutrophils 64 Not Estab. %   Lymphs 27 Not Estab. %   Monocytes 6 Not Estab. %   Eos 2 Not Estab. %   Basos 1 Not Estab. %   Neutrophils Absolute 5.1 1.4 - 7.0 x10E3/uL   Lymphocytes Absolute 2.1 0.7 - 3.1 x10E3/uL   Monocytes Absolute 0.4 0.1 - 0.9 x10E3/uL   EOS (ABSOLUTE) 0.2 0.0 - 0.4 x10E3/uL   Basophils Absolute 0.0 0.0 - 0.2 x10E3/uL   Immature Granulocytes 0 Not Estab. %   Immature Grans (Abs) 0.0 0.0 - 0.1 x10E3/uL  Thyroid  Panel With TSH   Collection Time: 10/03/22 10:22 AM  Result Value Ref Range   TSH 1.660 0.450 - 4.500 uIU/mL   T4, Total 11.0 4.5 - 12.0 ug/dL   T3 Uptake Ratio 26 24 - 39 %   Free Thyroxine Index 2.9 1.2 - 4.9       Pertinent labs & imaging results that were available during my care of the patient were reviewed by me and considered in my medical decision making.  Assessment & Plan:  Candida was seen today for medical management of chronic issues and breast mass.  Diagnoses and all orders for this visit:  Stage 3b chronic kidney disease  (HCC) -     CMP14+EGFR  Age-related osteoporosis without current pathological fracture -     CMP14+EGFR -     VITAMIN D  25 Hydroxy (Vit-D Deficiency, Fractures)  Renal artery stenosis (HCC) -     rosuvastatin  (CRESTOR ) 40 MG tablet; Take 1 tablet (40 mg total) by mouth daily. -     CMP14+EGFR -     CBC with Differential/Platelet -     Lipid panel  Occlusion and stenosis of left carotid artery -     rosuvastatin  (CRESTOR ) 40 MG tablet; Take 1 tablet (40 mg total) by mouth daily. -     clopidogrel  (PLAVIX ) 75 MG tablet; Take 1 tablet (75 mg total) by mouth daily. -     CMP14+EGFR -     CBC with Differential/Platelet -     Lipid panel  History of CVA (cerebrovascular accident) -     rosuvastatin  (CRESTOR ) 40 MG tablet; Take 1 tablet (40 mg total) by mouth daily. -     clopidogrel  (PLAVIX ) 75 MG tablet; Take 1 tablet (75 mg total) by mouth daily. -     CMP14+EGFR -     CBC with Differential/Platelet -     Lipid panel  Mixed hyperlipidemia -     rosuvastatin  (CRESTOR ) 40 MG tablet; Take 1 tablet (40 mg total) by mouth daily. -     CMP14+EGFR -     Lipid panel  Gastroesophageal reflux disease without esophagitis -     pantoprazole  (PROTONIX ) 40 MG tablet; Take 1 tablet by mouth once daily -     famotidine  (PEPCID ) 20 MG tablet; Take 1 tablet (20 mg total) by mouth daily. -     CBC with Differential/Platelet  Essential (primary) hypertension -     potassium chloride  SA (KLOR-CON  M) 20 MEQ tablet; Take 1 tablet (20 mEq total) by mouth 2 (two) times daily. -  furosemide  (LASIX ) 40 MG tablet; Take 1 tablet (40 mg total) by mouth 2 (two) times daily. -     carvedilol  (COREG ) 25 MG tablet; Take 1 tablet (25 mg total) by mouth 2 (two) times daily. -     amLODipine  (NORVASC ) 5 MG tablet; Take 1 tablet (5 mg total) by mouth daily. -     CMP14+EGFR -     CBC with Differential/Platelet -     Thyroid  Panel With TSH  Vitamin D  deficiency -     CMP14+EGFR -     VITAMIN D  25 Hydroxy  (Vit-D Deficiency, Fractures)  Lower extremity edema -     furosemide  (LASIX ) 40 MG tablet; Take 1 tablet (40 mg total) by mouth 2 (two) times daily. -     CMP14+EGFR -     CBC with Differential/Platelet  Nausea in adult -     ondansetron  (ZOFRAN ) 4 MG tablet; Take 1 tablet (4 mg total) by mouth every 8 (eight) hours as needed for nausea or vomiting.  Breast mass in female -     MM 3D DIAGNOSTIC MAMMOGRAM UNILATERAL LEFT BREAST -     US  BREAST COMPLETE UNI LEFT INC AXILLA  Need for vaccination -     Zoster Recombinant (Shingrix )        Breast Lump Reports two lumps in the left breast since March, one larger and one smaller, with mild pain. No recent mammogram performed; diagnostic imaging needed to differentiate between benign and malignant causes. - Order diagnostic mammogram and ultrasound for the left breast - Instruct to expect a call to schedule the mammogram and ultrasound - Advise to contact the office if not contacted within a week  Hypertension On amlodipine , carvedilol , and furosemide  for blood pressure management. Reports leg swelling, possibly related to medication or other conditions. Discussed non-prescription compression options to manage symptoms. - Continue current antihypertensive regimen - Recommend sports compression socks to manage leg swelling  Chronic Kidney Disease with Renal Artery Stenosis On Plavix . No changes in stool color, no black tarry stools, and no significant abnormal bleeding or bruising reported. Continues to be monitored for bleeding risks associated with antiplatelet therapy. - Continue Plavix  for renal artery stenosis management - Monitor for any signs of bleeding or bruising  Stroke On Plavix  for secondary prevention. No new neurological symptoms reported. - Continue Plavix  for stroke prevention  Hyperlipidemia On Crestor  for cholesterol management. No significant side effects such as muscle aches reported, which are common with  statins. - Continue Crestor  for cholesterol management - Monitor for any new or worsening muscle aches  Osteoporosis Taking Fosamax  weekly. Reports losing teeth but no issues with chewing or loose teeth. No bleeding gums reported. Dental health monitored due to potential bisphosphonate-related osteonecrosis of the jaw. - Continue Fosamax  for osteoporosis management  Nausea Experiences nausea at various times of the day, uses Zofran  as needed. No specific triggers identified. Management focuses on symptomatic relief. - Refill Zofran  prescription for nausea management  Skin Hydration Discussed use of creams for skin hydration, particularly for hemocytin staining on the legs. Eucerin or similar creams in a jar with a lid recommended for better hydration. - Recommend Eucerin or similar cream for skin hydration  Follow-up Needs follow-up for diagnostic imaging and lab work updates. - Update lab work - Schedule follow-up appointment after diagnostic imaging results are available         Total time spent with patient today was 40 minutes, this time was spent reviewing prior charts,  labs, x-rays, discussing plan of care, and documenting the encounter.   Continue all other maintenance medications.  Follow up plan: Return in about 6 months (around 01/02/2024), or if symptoms worsen or fail to improve, for chronic follow up.   The above assessment and management plan was discussed with the patient. The patient verbalized understanding of and has agreed to the management plan. Patient is aware to call the clinic if they develop any new symptoms or if symptoms persist or worsen. Patient is aware when to return to the clinic for a follow-up visit. Patient educated on when it is appropriate to go to the emergency department.   Kattie Parrot, FNP-C [facility] [phone]

## 2023-07-04 ENCOUNTER — Ambulatory Visit: Payer: Self-pay | Admitting: Family Medicine

## 2023-07-04 LAB — CMP14+EGFR
ALT: 15 IU/L (ref 0–32)
AST: 33 IU/L (ref 0–40)
Albumin: 4.5 g/dL (ref 3.8–4.8)
Alkaline Phosphatase: 57 IU/L (ref 44–121)
BUN/Creatinine Ratio: 14 (ref 12–28)
BUN: 15 mg/dL (ref 8–27)
Bilirubin Total: 0.6 mg/dL (ref 0.0–1.2)
CO2: 22 mmol/L (ref 20–29)
Calcium: 9.7 mg/dL (ref 8.7–10.3)
Chloride: 98 mmol/L (ref 96–106)
Creatinine, Ser: 1.11 mg/dL — ABNORMAL HIGH (ref 0.57–1.00)
Globulin, Total: 2.3 g/dL (ref 1.5–4.5)
Glucose: 108 mg/dL — ABNORMAL HIGH (ref 70–99)
Potassium: 4.1 mmol/L (ref 3.5–5.2)
Sodium: 138 mmol/L (ref 134–144)
Total Protein: 6.8 g/dL (ref 6.0–8.5)
eGFR: 50 mL/min/{1.73_m2} — ABNORMAL LOW (ref >59)

## 2023-07-04 LAB — CBC WITH DIFFERENTIAL/PLATELET
Basophils Absolute: 0.1 10*3/uL (ref 0.0–0.2)
Basos: 1 %
EOS (ABSOLUTE): 0.2 10*3/uL (ref 0.0–0.4)
Eos: 2 %
Hematocrit: 42.2 % (ref 34.0–46.6)
Hemoglobin: 13.8 g/dL (ref 11.1–15.9)
Immature Grans (Abs): 0 10*3/uL (ref 0.0–0.1)
Immature Granulocytes: 0 %
Lymphocytes Absolute: 2.3 10*3/uL (ref 0.7–3.1)
Lymphs: 33 %
MCH: 30.3 pg (ref 26.6–33.0)
MCHC: 32.7 g/dL (ref 31.5–35.7)
MCV: 93 fL (ref 79–97)
Monocytes Absolute: 0.6 10*3/uL (ref 0.1–0.9)
Monocytes: 9 %
Neutrophils Absolute: 3.8 10*3/uL (ref 1.4–7.0)
Neutrophils: 55 %
Platelets: 213 10*3/uL (ref 150–450)
RBC: 4.56 x10E6/uL (ref 3.77–5.28)
RDW: 12.4 % (ref 11.7–15.4)
WBC: 6.9 10*3/uL (ref 3.4–10.8)

## 2023-07-04 LAB — VITAMIN D 25 HYDROXY (VIT D DEFICIENCY, FRACTURES): Vit D, 25-Hydroxy: 46.5 ng/mL (ref 30.0–100.0)

## 2023-07-04 LAB — THYROID PANEL WITH TSH
Free Thyroxine Index: 2.9 (ref 1.2–4.9)
T3 Uptake Ratio: 29 % (ref 24–39)
T4, Total: 10 ug/dL (ref 4.5–12.0)
TSH: 2.23 u[IU]/mL (ref 0.450–4.500)

## 2023-07-04 LAB — LIPID PANEL
Chol/HDL Ratio: 1.9 ratio (ref 0.0–4.4)
Cholesterol, Total: 163 mg/dL (ref 100–199)
HDL: 84 mg/dL (ref >39)
LDL Chol Calc (NIH): 60 mg/dL (ref 0–99)
Triglycerides: 106 mg/dL (ref 0–149)
VLDL Cholesterol Cal: 19 mg/dL (ref 5–40)

## 2023-07-08 ENCOUNTER — Other Ambulatory Visit: Payer: Self-pay | Admitting: Family Medicine

## 2023-07-08 DIAGNOSIS — J302 Other seasonal allergic rhinitis: Secondary | ICD-10-CM

## 2023-07-24 ENCOUNTER — Inpatient Hospital Stay
Admission: RE | Admit: 2023-07-24 | Discharge: 2023-07-24 | Disposition: A | Discharge status: Home / Self Care | Payer: Self-pay | Source: Ambulatory Visit | Attending: Family Medicine

## 2023-07-24 ENCOUNTER — Other Ambulatory Visit: Payer: Self-pay | Admitting: Family Medicine

## 2023-07-24 ENCOUNTER — Inpatient Hospital Stay
Admission: RE | Admit: 2023-07-24 | Discharge: 2023-07-24 | Disposition: A | Discharge status: Home / Self Care | Payer: Self-pay | Source: Ambulatory Visit | Attending: Family Medicine | Admitting: Family Medicine

## 2023-07-24 DIAGNOSIS — N63 Unspecified lump in unspecified breast: Secondary | ICD-10-CM

## 2023-07-25 ENCOUNTER — Other Ambulatory Visit (HOSPITAL_COMMUNITY): Payer: Self-pay | Admitting: Family Medicine

## 2023-07-25 ENCOUNTER — Ambulatory Visit (HOSPITAL_COMMUNITY)
Admission: RE | Admit: 2023-07-25 | Discharge: 2023-07-25 | Disposition: A | Discharge status: Home / Self Care | Source: Ambulatory Visit | Attending: Family Medicine | Admitting: Family Medicine

## 2023-07-25 ENCOUNTER — Encounter (HOSPITAL_COMMUNITY): Payer: Self-pay

## 2023-07-25 ENCOUNTER — Ambulatory Visit: Payer: Self-pay | Admitting: Family Medicine

## 2023-07-25 DIAGNOSIS — N63 Unspecified lump in unspecified breast: Secondary | ICD-10-CM

## 2023-07-25 DIAGNOSIS — Z853 Personal history of malignant neoplasm of breast: Secondary | ICD-10-CM | POA: Insufficient documentation

## 2023-07-25 DIAGNOSIS — R928 Other abnormal and inconclusive findings on diagnostic imaging of breast: Secondary | ICD-10-CM

## 2023-07-25 DIAGNOSIS — R590 Localized enlarged lymph nodes: Secondary | ICD-10-CM | POA: Insufficient documentation

## 2023-07-25 DIAGNOSIS — N6323 Unspecified lump in the left breast, lower outer quadrant: Secondary | ICD-10-CM | POA: Insufficient documentation

## 2023-07-25 DIAGNOSIS — N6324 Unspecified lump in the left breast, lower inner quadrant: Secondary | ICD-10-CM | POA: Insufficient documentation

## 2023-07-25 DIAGNOSIS — R921 Mammographic calcification found on diagnostic imaging of breast: Secondary | ICD-10-CM | POA: Diagnosis not present

## 2023-07-25 DIAGNOSIS — Z803 Family history of malignant neoplasm of breast: Secondary | ICD-10-CM | POA: Diagnosis not present

## 2023-08-05 ENCOUNTER — Ambulatory Visit
Admission: RE | Admit: 2023-08-05 | Discharge: 2023-08-05 | Disposition: A | Discharge status: Home / Self Care | Source: Ambulatory Visit | Attending: Family Medicine | Admitting: Family Medicine

## 2023-08-05 DIAGNOSIS — R928 Other abnormal and inconclusive findings on diagnostic imaging of breast: Secondary | ICD-10-CM

## 2023-08-05 DIAGNOSIS — R92322 Mammographic fibroglandular density, left breast: Secondary | ICD-10-CM | POA: Diagnosis not present

## 2023-08-05 DIAGNOSIS — N6489 Other specified disorders of breast: Secondary | ICD-10-CM | POA: Diagnosis not present

## 2023-08-05 DIAGNOSIS — R590 Localized enlarged lymph nodes: Secondary | ICD-10-CM | POA: Diagnosis not present

## 2023-08-05 DIAGNOSIS — C50512 Malignant neoplasm of lower-outer quadrant of left female breast: Secondary | ICD-10-CM | POA: Diagnosis not present

## 2023-08-05 DIAGNOSIS — N6323 Unspecified lump in the left breast, lower outer quadrant: Secondary | ICD-10-CM | POA: Diagnosis not present

## 2023-08-05 DIAGNOSIS — R921 Mammographic calcification found on diagnostic imaging of breast: Secondary | ICD-10-CM

## 2023-08-05 DIAGNOSIS — N6324 Unspecified lump in the left breast, lower inner quadrant: Secondary | ICD-10-CM | POA: Diagnosis not present

## 2023-08-05 HISTORY — PX: BREAST BIOPSY: SHX20

## 2023-08-06 LAB — SURGICAL PATHOLOGY

## 2023-08-12 NOTE — Progress Notes (Signed)
 Done - see results in chart

## 2023-08-27 ENCOUNTER — Encounter: Payer: Self-pay | Admitting: *Deleted

## 2023-08-27 ENCOUNTER — Ambulatory Visit: Admitting: General Surgery

## 2023-08-27 ENCOUNTER — Telehealth: Payer: Self-pay

## 2023-08-27 ENCOUNTER — Encounter: Payer: Self-pay | Admitting: General Surgery

## 2023-08-27 VITALS — BP 148/68 | HR 61 | Temp 97.3°F | Resp 14 | Ht 63.0 in | Wt 109.0 lb

## 2023-08-27 DIAGNOSIS — C50512 Malignant neoplasm of lower-outer quadrant of left female breast: Secondary | ICD-10-CM | POA: Diagnosis not present

## 2023-08-27 DIAGNOSIS — Z170 Estrogen receptor positive status [ER+]: Secondary | ICD-10-CM | POA: Insufficient documentation

## 2023-08-27 NOTE — Progress Notes (Unsigned)
 [facility] History and Physical  Reason for Referral:*** Referring Physician: ***  Chief Complaint   New Patient (Initial Visit)     Tamara King is a 83 y.o. female.  HPI:   Discussed the use of AI scribe software for clinical note transcription with the patient, who gave verbal consent to proceed.  History of Present Illness      ***.  The *** started *** and has had a duration of ***.  It is associated with ***.  The *** is improved with ***, and is made worse with ***.    Quality*** Context***  Past Medical History:  Diagnosis Date  . Breast cancer (HCC) 2006   right Adenocarcinoma  . Chronic edema   . CKD (chronic kidney disease) stage 3, GFR 30-59 ml/min (HCC) 10/22/2018  . Coronary atherosclerosis of native coronary artery    Nonobstructive at catheterization 2003, LVEF normal   . Essential hypertension   . GERD (gastroesophageal reflux disease) 07/06/2013  . Idiopathic peripheral neuropathy 10/06/2013  . Mixed hyperlipidemia   . Occlusion and stenosis of left carotid artery 10/22/2017  . Personal history of radiation therapy 2006  . Renal artery stenosis (HCC)    BMS bilateally 2003, PTCA bilaterally 2005 with restenosis  . Sleep apnea    Stop Bang score of 4  . Stroke (HCC)   . Symptomatic varicose veins, right 10/16/2018    Past Surgical History:  Procedure Laterality Date  . APPENDECTOMY    . BREAST BIOPSY Left 08/05/2023   US  LT BREAST BX W LOC DEV 1ST LESION IMG BX SPEC US  GUIDE 08/05/2023 [facility]  . BREAST BIOPSY Left 08/05/2023   US  LT BREAST BX W LOC DEV EA ADD LESION IMG BX SPEC US  GUIDE 08/05/2023 [facility]  . BREAST BIOPSY Left 08/05/2023   MM LT BREAST BX W LOC DEV 1ST LESION IMAGE BX SPEC STEREO GUIDE 08/05/2023 [facility]  . BREAST SURGERY Left    ?what type per pt  . CATARACT EXTRACTION W/PHACO  03/29/2011   Procedure: CATARACT EXTRACTION PHACO AND INTRAOCULAR LENS PLACEMENT (IOC);  Surgeon:  Cherene Mania, MD;  Location: [facility];  Service: Ophthalmology;  Laterality: Right;  CDE:12.82  . CATARACT EXTRACTION W/PHACO  04/16/2011   Procedure: CATARACT EXTRACTION PHACO AND INTRAOCULAR LENS PLACEMENT (IOC);  Surgeon: Cherene Mania, MD;  Location: [facility];  Service: Ophthalmology;  Laterality: Left;  CDE: 11.54  . CESAREAN SECTION     x2  . CHOLECYSTECTOMY  2018  . HEMORRHOID SURGERY    . RENAL ANGIOGRAPHY N/A 05/20/2019   Procedure: RENAL ANGIOGRAPHY;  Surgeon: Darron Deatrice LABOR, MD;  Location: [facility];  Service: Cardiovascular;  Laterality: N/A;  . RENAL INTERVENTION  05/20/2019   Procedure: RENAL INTERVENTION;  Surgeon: Darron Deatrice LABOR, MD;  Location: [facility];  Service: Cardiovascular;;  . Right breast lumpectomy Right 2006  . ROTATOR CUFF REPAIR  2008  . UPPER EXTREMITY ANGIOGRAPHY N/A 11/27/2017   Procedure: UPPER EXTREMITY ANGIOGRAPHY;  Surgeon: Gretta Lonni PARAS, MD;  Location: [facility];  Service: Cardiovascular;  Laterality: N/A;    Family History  Problem Relation Age of Onset  . Heart failure Mother   . Stroke Mother 63  . Lung cancer Father   . Diabetes Father   . Diabetes Sister   . Brain cancer Sister   . COPD Sister   . Breast cancer Paternal Aunt   . Brain cancer Brother   . Diabetes  Brother   . Diabetes Brother   . Diabetes Son   . Thyroid  cancer Son   . Cancer Son   . Diabetes Son   . Anesthesia problems Neg Hx   . Hypotension Neg Hx   . Malignant hyperthermia Neg Hx   . Pseudochol deficiency Neg Hx     Social History   Tobacco Use  . Smoking status: Former    Current packs/day: 0.00    Average packs/day: 1 pack/day for 35.0 years (35.0 ttl pk-yrs)    Types: Cigarettes    Start date: 01/23/1963    Quit date: 01/22/1998    Years since quitting: 25.6  . Smokeless tobacco: Never  . Tobacco comments:    tobacco use - no  Vaping Use  . Vaping status: Never Used  Substance Use Topics  . Alcohol use: No  . Drug use: No     Medications: {medication reviewed/display:3041432} Allergies as of 08/27/2023       Reactions   Losartan Anaphylaxis   Sulfa Antibiotics Anaphylaxis, Swelling   Codeine Nausea And Vomiting   Fish Allergy Other (See Comments)   sick as a dog   Other Other (See Comments)   Bologna - vomiting   Spironolactone  Other (See Comments)   Acute kidney injury   Vancomycin Other (See Comments)   drives me crazy   Penicillins Rash        Medication List        Accurate as of August 27, 2023  9:38 AM. If you have any questions, ask your nurse or doctor.          acetaminophen  500 MG tablet Commonly known as: TYLENOL  Take 500 mg by mouth every 6 (six) hours as needed for moderate pain.   alendronate  70 MG tablet Commonly known as: FOSAMAX  TAKE 1 TABLET BY MOUTH ONCE A WEEK WITH A FULL GLASS OF WATER ON AN EMPTY STOMACH   amLODipine  5 MG tablet Commonly known as: NORVASC  Take 1 tablet (5 mg total) by mouth daily.   carvedilol  25 MG tablet Commonly known as: COREG  Take 1 tablet (25 mg total) by mouth 2 (two) times daily.   clopidogrel  75 MG tablet Commonly known as: PLAVIX  Take 1 tablet (75 mg total) by mouth daily.   EQ Allergy Relief (Cetirizine ) 10 MG tablet Generic drug: cetirizine  Take 1 tablet by mouth once daily   famotidine  20 MG tablet Commonly known as: PEPCID  Take 1 tablet (20 mg total) by mouth daily.   furosemide  40 MG tablet Commonly known as: LASIX  Take 1 tablet (40 mg total) by mouth 2 (two) times daily.   ondansetron  4 MG tablet Commonly known as: Zofran  Take 1 tablet (4 mg total) by mouth every 8 (eight) hours as needed for nausea or vomiting.   pantoprazole  40 MG tablet Commonly known as: PROTONIX  Take 1 tablet by mouth once daily   potassium chloride  SA 20 MEQ tablet Commonly known as: KLOR-CON  M Take 1 tablet (20 mEq total) by mouth 2 (two) times daily.   rosuvastatin  40 MG tablet Commonly known as: CRESTOR  Take 1 tablet (40 mg  total) by mouth daily.   Vitamin D3 25 MCG (1000 UT) Caps Take 1 capsule by mouth daily.         ROS:  {Review of Systems:30496}  Blood pressure (!) 148/68, pulse 61, temperature (!) 97.3 F (36.3 C), temperature source Oral, resp. rate 14, height 5' 3 (1.6 m), weight 109 lb (49.4 kg), SpO2 92%. Physical Exam  Physical Exam   Results: No results found for this or any previous visit (from the past 48 hours).  No results found.   Assessment and Plan: Assessment and Plan Assessment & Plan      Tamara King is a 83 y.o. female with *** -*** -*** -Follow up ***  All questions were answered to the satisfaction of the patient and family***.  The risk and benefits of *** were discussed including but not limited to ***.  After careful consideration, Tamara King has decided to ***.    Manuelita JAYSON Pander 08/27/2023, 9:38 AM

## 2023-08-27 NOTE — Telephone Encounter (Signed)
 Dr. Debera,  Ms. Pollino is requesting preoperative cardiac evaluation for left breast mastectomy with possible lymph node biopsy.   She was seen in clinic on 05/15/2023.  She remained stable from a cardiac standpoint at that time.  Her PMH includes bilateral carotid artery stenosis, bilateral renal artery stenosis status post previous stent intervention and angioplasty due to in-stent restenosis, nonobstructive CAD, and hypertension.  May her Plavix  be held prior to her procedure?  Thank you for your help.  Please direct your response to CVD IV preop pool.  Josefa HERO. Zori Benbrook NP-C     08/27/2023, 1:16 PM [facility] [address] Office [phone] Fax [phone]

## 2023-08-27 NOTE — Patient Instructions (Signed)
 Will get you to see Oncology and discuss the options with them. Will have a conversation about your history and make the best plan for you based on your history and cancer.  Will get Dr. Debera to weigh in regarding your plavix  and your carotid stenosis.

## 2023-08-27 NOTE — Telephone Encounter (Signed)
   Pre-operative Risk Assessment    Patient Name: Tamara King  DOB: 23-Aug-1940 MRN: [id number]   Date of last office visit: 05/15/2023, Dr. Jayson Sierras, MD Date of next office visit: 11/12/2023, Almarie Crate, NP   Request for Surgical Clearance    Procedure:  Carcinoma of lower-outer quadrant of left breast in female, estrogen receptor positive Left mastectomy with possible lymph node biopsy   Date of Surgery:  Clearance TBD                                Surgeon: Dr. Manuelita Pander, MD Surgeon's Group or Practice Name: [facility]  Phone number: [phone] Fax number: [phone]   Type of Clearance Requested:   - Medical  - Pharmacy:  Hold Clopidogrel  (Plavix ) 5 days   Type of Anesthesia:  TBD   Additional requests/questions:    Bonney Asberry KANDICE Ethelene   08/27/2023, 12:52 PM

## 2023-08-28 NOTE — Addendum Note (Signed)
 Addended by: KALLIE SHAVER on: 08/28/2023 12:36 PM   Modules accepted: Level of Service

## 2023-08-28 NOTE — Telephone Encounter (Signed)
   Patient Name: Tamara King  DOB: 19-May-1940 MRN: [id number]  Primary Cardiologist: Jayson Sierras, MD  Chart reviewed as part of pre-operative protocol coverage. Given past medical history and time since last visit, based on ACC/AHA guidelines, ASERET HOFFMAN is at acceptable risk for the planned procedure without further cardiovascular testing.  Per office protocol, if patient is without any new symptoms or concerns at the time of their virtual visit, she may hold Plavix  for 5 days prior to procedure. Please resume Plavix  as soon as possible postprocedure, at the discretion of the surgeon.    The patient was advised that if she develops new symptoms prior to surgery to contact our office to arrange for a follow-up visit, and she verbalized understanding.  I will route this recommendation to the requesting party via Epic fax function and remove from pre-op pool.  Please call with questions.  Lamarr Satterfield, NP 08/28/2023, 8:37 AM

## 2023-09-02 ENCOUNTER — Inpatient Hospital Stay

## 2023-09-02 ENCOUNTER — Inpatient Hospital Stay: Attending: Oncology | Admitting: Oncology

## 2023-09-02 VITALS — BP 130/63 | HR 60 | Temp 97.4°F | Resp 18 | Ht 61.61 in | Wt 107.8 lb

## 2023-09-02 DIAGNOSIS — Z170 Estrogen receptor positive status [ER+]: Secondary | ICD-10-CM | POA: Insufficient documentation

## 2023-09-02 DIAGNOSIS — Z9011 Acquired absence of right breast and nipple: Secondary | ICD-10-CM | POA: Insufficient documentation

## 2023-09-02 DIAGNOSIS — Z853 Personal history of malignant neoplasm of breast: Secondary | ICD-10-CM | POA: Insufficient documentation

## 2023-09-02 DIAGNOSIS — M810 Age-related osteoporosis without current pathological fracture: Secondary | ICD-10-CM | POA: Diagnosis not present

## 2023-09-02 DIAGNOSIS — Z923 Personal history of irradiation: Secondary | ICD-10-CM | POA: Insufficient documentation

## 2023-09-02 DIAGNOSIS — C50512 Malignant neoplasm of lower-outer quadrant of left female breast: Secondary | ICD-10-CM | POA: Insufficient documentation

## 2023-09-02 MED ORDER — TAMOXIFEN CITRATE 20 MG PO TABS: 20.0000 mg | ORAL_TABLET | Freq: Every day | ORAL | 1 refills | Status: DC

## 2023-09-02 NOTE — Assessment & Plan Note (Addendum)
 Patient has a history of osteoporosis and is taking alendronate  Also taking vitamin D  but not taking calcium  as her calcium  levels were high previously  - Will not consider aromatase inhibitor for her treatment options - Will obtain a new DEXA scan -Continue vitamin D  and alendronate 

## 2023-09-02 NOTE — Progress Notes (Signed)
 Hematology-Oncology Clinic Note  Tamara King, Tamara HERO, FNP   Reason for Referral: Breast carcinoma  Oncology History: I have reviewed her chart and materials related to her cancer extensively and collaborated history with the patient. Summary of oncologic history is as follows:  -08/15/2004: Right breast core biopsy: Invasive ductal carcinoma.  ER: Positive, 80%, PR: Positive, 90%, Ki-67: 7%, HER2: Negative, 1+ at [facility] -08/25/2004: Right breast partial mastectomy: Moderately differentiated invasive ductal carcinoma.  SLNB: Negative for carcinoma, pT1b,pN0,pMx -As per documentation received adjuvant radiation.  Did not receive endocrine therapy but was taken off of estrogen -07/25/2023: Mammogram: Left: 2 oval masses with microlobulated and irregular margins seen in the lower outer left breast corresponding to the palpable abnormality.  The larger mass measures 2.8 X 1.4 X 2.5 cm.  The smaller mass measures 1.3 X 0.9 X 1.2 cm.  1.4 cm group of heterogeneous and linear calcifications seen in the lower inner left breast, middle to posterior depth. -08/05/2023: Left breast core needle biopsy: Invasive mammary carcinoma, no special type(ductal).  Overall grade 2.  Lymphovascular invasion not identified.  Left axillary lymph node biopsy: Invasive mammary carcinoma.  ER: Positive, 100%, PR: Negative, HER2: Negative, 0.  Ki-67: 15%  History of Presenting Illness: Tamara King 83 y.o. female is referred by Dr.Bridges for recurrent breast carcinoma.  Patient was accompanied by her sister-in-law today.  Ms. Evaline was diagnosed of right breast ER/PR positive HER2 negative carcinoma in 2006, s/p partial mastectomy with sentinel lymph node biopsy and adjuvant radiation.  She did not receive endocrine therapy but was rather taken off of estrogen treatment at that time.  She felt a left breast mass in March 2025 and then had a mammogram done in July consistent with the mass.  She then had biopsy which revealed  breast cancer that is ER positive and HER2 negative.  Her past medical history includes hypertension, CAD, bilateral carotid artery stenosis, ischemic stroke.  She also has a history of osteoporosis and is taking medication for that.  She lives in [place] with her son and 2 granddaughters.  She is very involved in cooking and household activities.  She quit smoking 23 years ago and does not consume alcohol.  She reports breast cancer in her father's sister.  Overall, is doing well.  Has no new complaints.   Medical History: Past Medical History:  Diagnosis Date   Breast cancer (HCC) 2006   right Adenocarcinoma   Chronic edema    CKD (chronic kidney disease) stage 3, GFR 30-59 ml/min (HCC) 10/22/2018   Coronary atherosclerosis of native coronary artery    Nonobstructive at catheterization 2003, LVEF normal    Essential hypertension    GERD (gastroesophageal reflux disease) 07/06/2013   Idiopathic peripheral neuropathy 10/06/2013   Mixed hyperlipidemia    Occlusion and stenosis of left carotid artery 10/22/2017   Personal history of radiation therapy 2006   Renal artery stenosis (HCC)    BMS bilateally 2003, PTCA bilaterally 2005 with restenosis   Sleep apnea    Stop Bang score of 4   Stroke (HCC)    Symptomatic varicose veins, right 10/16/2018    Surgical history: Past Surgical History:  Procedure Laterality Date   APPENDECTOMY     BREAST BIOPSY Left 08/05/2023   US  LT BREAST BX W LOC DEV 1ST LESION IMG BX SPEC US  GUIDE 08/05/2023 [facility]   BREAST BIOPSY Left 08/05/2023   US  LT BREAST BX W LOC DEV EA ADD LESION IMG BX SPEC  US  GUIDE 08/05/2023 [facility]   BREAST BIOPSY Left 08/05/2023   MM LT BREAST BX W LOC DEV 1ST LESION IMAGE BX SPEC STEREO GUIDE 08/05/2023 [facility]   BREAST SURGERY Left    ?what type per pt   CATARACT EXTRACTION W/PHACO  03/29/2011   Procedure: CATARACT EXTRACTION PHACO AND INTRAOCULAR LENS PLACEMENT (IOC);  Surgeon: Cherene Mania, MD;  Location: [facility];  Service: Ophthalmology;  Laterality: Right;  CDE:12.82   CATARACT EXTRACTION W/PHACO  04/16/2011   Procedure: CATARACT EXTRACTION PHACO AND INTRAOCULAR LENS PLACEMENT (IOC);  Surgeon: Cherene Mania, MD;  Location: [facility];  Service: Ophthalmology;  Laterality: Left;  CDE: 11.54   CESAREAN SECTION     x2   CHOLECYSTECTOMY  2018   HEMORRHOID SURGERY     RENAL ANGIOGRAPHY N/A 05/20/2019   Procedure: RENAL ANGIOGRAPHY;  Surgeon: Darron Deatrice LABOR, MD;  Location: [facility];  Service: Cardiovascular;  Laterality: N/A;   RENAL INTERVENTION  05/20/2019   Procedure: RENAL INTERVENTION;  Surgeon: Darron Deatrice LABOR, MD;  Location: [facility];  Service: Cardiovascular;;   Right breast lumpectomy Right 2006   ROTATOR CUFF REPAIR  2008   UPPER EXTREMITY ANGIOGRAPHY N/A 11/27/2017   Procedure: UPPER EXTREMITY ANGIOGRAPHY;  Surgeon: Gretta Lonni PARAS, MD;  Location: [facility];  Service: Cardiovascular;  Laterality: N/A;     Allergies:  is allergic to losartan, sulfa antibiotics, codeine, fish allergy, other, spironolactone , vancomycin, and penicillins.  Medications:  Current Outpatient Medications  Medication Sig Dispense Refill   acetaminophen  (TYLENOL ) 500 MG tablet Take 500 mg by mouth every 6 (six) hours as needed for moderate pain.     alendronate  (FOSAMAX ) 70 MG tablet TAKE 1 TABLET BY MOUTH ONCE A WEEK WITH A FULL GLASS OF WATER ON AN EMPTY STOMACH 12 tablet 1   amLODipine  (NORVASC ) 5 MG tablet Take 1 tablet (5 mg total) by mouth daily. 90 tablet 1   carvedilol  (COREG ) 25 MG tablet Take 1 tablet (25 mg total) by mouth 2 (two) times daily. 180 tablet 1   cetirizine  (EQ ALLERGY RELIEF, CETIRIZINE ,) 10 MG tablet Take 1 tablet by mouth once daily 90 tablet 1   Cholecalciferol (VITAMIN D3) 25 MCG (1000 UT) CAPS Take 1 capsule by mouth daily.     clopidogrel  (PLAVIX ) 75 MG tablet Take 1 tablet (75 mg total) by mouth daily. 90 tablet 1   famotidine   (PEPCID ) 20 MG tablet Take 1 tablet (20 mg total) by mouth daily. 90 tablet 1   furosemide  (LASIX ) 40 MG tablet Take 1 tablet (40 mg total) by mouth 2 (two) times daily. 180 tablet 1   ondansetron  (ZOFRAN ) 4 MG tablet Take 1 tablet (4 mg total) by mouth every 8 (eight) hours as needed for nausea or vomiting. 20 tablet 0   pantoprazole  (PROTONIX ) 40 MG tablet Take 1 tablet by mouth once daily 90 tablet 1   potassium chloride  SA (KLOR-CON  M) 20 MEQ tablet Take 1 tablet (20 mEq total) by mouth 2 (two) times daily. 180 tablet 1   rosuvastatin  (CRESTOR ) 40 MG tablet Take 1 tablet (40 mg total) by mouth daily. 90 tablet 1   tamoxifen  (NOLVADEX ) 20 MG tablet Take 1 tablet (20 mg total) by mouth daily. 30 tablet 1   No current facility-administered medications for this visit.    Review of Systems: Constitutional: Denies fevers, chills or abnormal night sweats Eyes: Denies blurriness of vision, double vision or watery eyes Ears, nose, mouth,  throat, and face: Denies mucositis or sore throat Respiratory: Denies cough, dyspnea or wheezes Cardiovascular: Denies palpitation, chest discomfort or lower extremity swelling Gastrointestinal:  Denies nausea, heartburn or change in bowel habits Skin: Denies abnormal skin rashes Lymphatics: Denies new lymphadenopathy or easy bruising Neurological:Denies numbness, tingling or new weaknesses Behavioral/Psych: Mood is stable, no new changes  All other systems were reviewed with the patient and are negative.  Physical Examination: ECOG PERFORMANCE STATUS: 1 - Symptomatic but completely ambulatory  Vitals:   09/02/23 1307  BP: 130/63  Pulse: 60  Resp: 18  Temp: (!) 97.4 F (36.3 C)  SpO2: 96%     GENERAL:alert, no distress and comfortable SKIN: skin color, texture, turgor are normal, no rashes or significant lesions BREAST: Right breast: Scar tissue in the lower outer quadrant, tender.  Left breast: Palpable left lower outer quadrant breast mass 6 cm  into 3 cm, firm, hard, matted.  Palpable left axillary lymph node. LYMPH:  no palpable lymphadenopathy in the cervical, or inguinal LUNGS: clear to auscultation and percussion with normal breathing effort HEART: regular rate & rhythm and no murmurs and no lower extremity edema ABDOMEN:abdomen soft, non-tender and normal bowel sounds Musculoskeletal:no cyanosis of digits and no clubbing  PSYCH: alert & oriented x 3 with fluent speech NEURO: no focal motor/sensory deficits   Laboratory Data: I have reviewed the data as listed Lab Results  Component Value Date   WBC 6.9 07/03/2023   HGB 13.8 07/03/2023   HCT 42.2 07/03/2023   MCV 93 07/03/2023   PLT 213 07/03/2023   Recent Labs    09/19/22 1131 09/19/22 1611 10/03/22 1022 07/03/23 1041  NA 137  --  140 138  K 2.5* 4.1 3.9 4.1  CL 100  --  101 98  CO2 28  --  22 22  GLUCOSE 117*  --  112* 108*  BUN 16  --  13 15  CREATININE 1.02*  --  1.08* 1.11*  CALCIUM  8.7*  --  9.5 9.7  GFRNONAA 56*  --   --   --   PROT  --   --  7.1 6.8  ALBUMIN  --   --  4.6 4.5  AST  --   --  31 33  ALT  --   --  22 15  ALKPHOS  --   --  59 57  BILITOT  --   --  0.5 0.6    Radiographic Studies: I have personally reviewed the radiological images as listed and agreed with the findings in the report.  US  LT BREAST BX W LOC DEV EA ADD LESION IMG BX SPEC US  GUIDE Addendum: ADDENDUM REPORT: 08/06/2023 14:22   ADDENDUM:  Pathology revealed CALCIFICATIONS INVOLVING BENIGN GLANDS, NEGATIVE  FOR ATYPIA OR MALIGNANCY of the LEFT breast, lower-inner quadrant,  (ribbon clip). This was found to be concordant by Dr. Dina Arceo.   Pathology revealed GRADE II INVASIVE MAMMARY CARCINOMA, NO SPECIAL  TYPE (DUCTAL), LYMPHOVASCULAR INVASION: NOT IDENTIFIED of the LEFT  breast, lower-outer quadrant, 4 o'clock, 7cmfn, (coil clip). This  was found to be concordant by Dr. Dina Arceo.   Pathology revealed INVASIVE MAMMARY CARCINOMA, NO DEFINITIVE  LYMPHOID TISSUE  PRESENT of the LEFT axilla, (hydromark clip). This  was found to be concordant by Dr. Dina Arceo.   Pathology results were discussed with the patient by telephone. The  patient reported doing well after the biopsies with moderate  tenderness at the sites. Post biopsy instructions and care were  reviewed and questions were answered. The patient was encouraged to  call [facility] for any additional  concerns. My direct phone number was provided.   Surgical consultation request was sent to [facility] in [place], [place], per patient request, via secure EPIC  message on August 06, 2023.   Pathology results reported by Hendricks Benders, RN on 08/06/2023.   Electronically Signed    By: Dina  Arceo M.D.    On: 08/06/2023 14:22 Narrative: CLINICAL DATA:  Suspicious calcifications in lower-inner quadrant of the left breast, mass in the lower outer quadrant of the left breast and left axillary lymph node.  EXAM: STEREOTACTIC BIOPSY OF THE LEFT BREAST AND ULTRASOUND GUIDED LEFT BREAST AND ULTRASOUND-GUIDED LEFT AXILLARY LYMPH NODE CORE NEEDLE BIOPSIES  COMPARISON:  Previous exam(s).  PROCEDURE: The patient and I discussed the procedure of stereotactic-guided biopsy including benefits and alternatives. We discussed the high likelihood of a successful procedure. We discussed the risks of the procedure including infection, bleeding, tissue injury, clip migration, and inadequate sampling. Informed written consent was given. The usual time out protocol was performed immediately prior to the procedure.  Using sterile technique and 1% lidocaine  and 1% lidocaine  with epinephrine  as local anesthetic, under stereotactic guidance, a 9 gauge vacuum assisted device was used to perform core needle biopsy of CALCIFICATIONS IN THE LOWER-INNER QUADRANT OF THE LEFT BREAST using a medial to lateral approach. Specimen radiograph was performed showing  calcifications are present in the tissue samples. Specimens with calcifications are identified for pathology.  Lesion quadrant: Lower inner quadrant  At the conclusion of the procedure, ribbon shaped tissue marker clip was deployed into the biopsy cavity. Follow-up 2-view mammogram was performed and dictated separately.  I met with the patient and we discussed the procedure of ultrasound-guided biopsy, including benefits and alternatives. We discussed the high likelihood of a successful procedure. We discussed the risks of the procedure, including infection, bleeding, tissue injury, clip migration, and inadequate sampling. Informed written consent was given. The usual time-out protocol was performed immediately prior to the procedure.  Lesion quadrant: Lower outer quadrant  Using sterile technique and 1% lidocaine  and 1% lidocaine  with epinephrine  as local anesthetic, under direct ultrasound visualization, a 14 gauge spring-loaded device was used to perform biopsy of LOWER OUTER QUADRANT using a lateral to medial approach. At the conclusion of the procedure coil shaped tissue marker clip was deployed into the biopsy cavity. Follow up 2 view mammogram was performed and dictated separately.  Lesion quadrant: Left axilla  Using sterile technique and 1% lidocaine  and 1% lidocaine  with epinephrine  as local anesthetic, under direct ultrasound visualization, a 14 gauge spring-loaded device was used to perform biopsy of left axillary lymph node using a lateral to medial approach. At the conclusion of the procedure [facility] shaped tissue marker clip was deployed into the biopsy cavity. Follow up 2 view mammogram was performed and dictated separately.  IMPRESSION: Status post stereotactic biopsy of the left breast, ultrasound-guided core biopsy of the left breast and ultrasound-guided core biopsy of a left axillary lymph node.  Electronically Signed: By: Dina  Arceo M.D. On:  08/05/2023 11:18 US  LT BREAST BX W LOC DEV 1ST LESION IMG BX SPEC US  GUIDE Addendum: ADDENDUM REPORT: 08/06/2023 14:22   ADDENDUM:  Pathology revealed CALCIFICATIONS INVOLVING BENIGN GLANDS, NEGATIVE  FOR ATYPIA OR MALIGNANCY of the LEFT breast, lower-inner quadrant,  (ribbon clip). This was found to be concordant by Dr. Dina Arceo.   Pathology revealed GRADE  II INVASIVE MAMMARY CARCINOMA, NO SPECIAL  TYPE (DUCTAL), LYMPHOVASCULAR INVASION: NOT IDENTIFIED of the LEFT  breast, lower-outer quadrant, 4 o'clock, 7cmfn, (coil clip). This  was found to be concordant by Dr. Dina Arceo.   Pathology revealed INVASIVE MAMMARY CARCINOMA, NO DEFINITIVE  LYMPHOID TISSUE PRESENT of the LEFT axilla, (hydromark clip). This  was found to be concordant by Dr. Dina Arceo.   Pathology results were discussed with the patient by telephone. The  patient reported doing well after the biopsies with moderate  tenderness at the sites. Post biopsy instructions and care were  reviewed and questions were answered. The patient was encouraged to  call [facility] for any additional  concerns. My direct phone number was provided.   Surgical consultation request was sent to [facility] in [place], [place], per patient request, via secure EPIC  message on August 06, 2023.   Pathology results reported by Hendricks Benders, RN on 08/06/2023.   Electronically Signed    By: Dina  Arceo M.D.    On: 08/06/2023 14:22 Narrative: CLINICAL DATA:  Suspicious calcifications in lower-inner quadrant of the left breast, mass in the lower outer quadrant of the left breast and left axillary lymph node.  EXAM: STEREOTACTIC BIOPSY OF THE LEFT BREAST AND ULTRASOUND GUIDED LEFT BREAST AND ULTRASOUND-GUIDED LEFT AXILLARY LYMPH NODE CORE NEEDLE BIOPSIES  COMPARISON:  Previous exam(s).  PROCEDURE: The patient and I discussed the procedure of stereotactic-guided biopsy including benefits and  alternatives. We discussed the high likelihood of a successful procedure. We discussed the risks of the procedure including infection, bleeding, tissue injury, clip migration, and inadequate sampling. Informed written consent was given. The usual time out protocol was performed immediately prior to the procedure.  Using sterile technique and 1% lidocaine  and 1% lidocaine  with epinephrine  as local anesthetic, under stereotactic guidance, a 9 gauge vacuum assisted device was used to perform core needle biopsy of CALCIFICATIONS IN THE LOWER-INNER QUADRANT OF THE LEFT BREAST using a medial to lateral approach. Specimen radiograph was performed showing calcifications are present in the tissue samples. Specimens with calcifications are identified for pathology.  Lesion quadrant: Lower inner quadrant  At the conclusion of the procedure, ribbon shaped tissue marker clip was deployed into the biopsy cavity. Follow-up 2-view mammogram was performed and dictated separately.  I met with the patient and we discussed the procedure of ultrasound-guided biopsy, including benefits and alternatives. We discussed the high likelihood of a successful procedure. We discussed the risks of the procedure, including infection, bleeding, tissue injury, clip migration, and inadequate sampling. Informed written consent was given. The usual time-out protocol was performed immediately prior to the procedure.  Lesion quadrant: Lower outer quadrant  Using sterile technique and 1% lidocaine  and 1% lidocaine  with epinephrine  as local anesthetic, under direct ultrasound visualization, a 14 gauge spring-loaded device was used to perform biopsy of LOWER OUTER QUADRANT using a lateral to medial approach. At the conclusion of the procedure coil shaped tissue marker clip was deployed into the biopsy cavity. Follow up 2 view mammogram was performed and dictated separately.  Lesion quadrant: Left axilla  Using sterile  technique and 1% lidocaine  and 1% lidocaine  with epinephrine  as local anesthetic, under direct ultrasound visualization, a 14 gauge spring-loaded device was used to perform biopsy of left axillary lymph node using a lateral to medial approach. At the conclusion of the procedure [facility] shaped tissue marker clip was deployed into the biopsy cavity. Follow up 2 view mammogram was performed  and dictated separately.  IMPRESSION: Status post stereotactic biopsy of the left breast, ultrasound-guided core biopsy of the left breast and ultrasound-guided core biopsy of a left axillary lymph node.  Electronically Signed: By: Dina  Arceo M.D. On: 08/05/2023 11:18 MM LT BREAST BX W LOC DEV 1ST LESION IMAGE BX SPEC STEREO GUIDE Addendum: ADDENDUM REPORT: 08/06/2023 14:22   ADDENDUM:  Pathology revealed CALCIFICATIONS INVOLVING BENIGN GLANDS, NEGATIVE  FOR ATYPIA OR MALIGNANCY of the LEFT breast, lower-inner quadrant,  (ribbon clip). This was found to be concordant by Dr. Dina Arceo.   Pathology revealed GRADE II INVASIVE MAMMARY CARCINOMA, NO SPECIAL  TYPE (DUCTAL), LYMPHOVASCULAR INVASION: NOT IDENTIFIED of the LEFT  breast, lower-outer quadrant, 4 o'clock, 7cmfn, (coil clip). This  was found to be concordant by Dr. Dina Arceo.   Pathology revealed INVASIVE MAMMARY CARCINOMA, NO DEFINITIVE  LYMPHOID TISSUE PRESENT of the LEFT axilla, (hydromark clip). This  was found to be concordant by Dr. Dina Arceo.   Pathology results were discussed with the patient by telephone. The  patient reported doing well after the biopsies with moderate  tenderness at the sites. Post biopsy instructions and care were  reviewed and questions were answered. The patient was encouraged to  call [facility] for any additional  concerns. My direct phone number was provided.   Surgical consultation request was sent to [facility] in [place], [place], per patient  request, via secure EPIC  message on August 06, 2023.   Pathology results reported by Hendricks Benders, RN on 08/06/2023.   Electronically Signed    By: Dina  Arceo M.D.    On: 08/06/2023 14:22 Narrative: CLINICAL DATA:  Suspicious calcifications in lower-inner quadrant of the left breast, mass in the lower outer quadrant of the left breast and left axillary lymph node.  EXAM: STEREOTACTIC BIOPSY OF THE LEFT BREAST AND ULTRASOUND GUIDED LEFT BREAST AND ULTRASOUND-GUIDED LEFT AXILLARY LYMPH NODE CORE NEEDLE BIOPSIES  COMPARISON:  Previous exam(s).  PROCEDURE: The patient and I discussed the procedure of stereotactic-guided biopsy including benefits and alternatives. We discussed the high likelihood of a successful procedure. We discussed the risks of the procedure including infection, bleeding, tissue injury, clip migration, and inadequate sampling. Informed written consent was given. The usual time out protocol was performed immediately prior to the procedure.  Using sterile technique and 1% lidocaine  and 1% lidocaine  with epinephrine  as local anesthetic, under stereotactic guidance, a 9 gauge vacuum assisted device was used to perform core needle biopsy of CALCIFICATIONS IN THE LOWER-INNER QUADRANT OF THE LEFT BREAST using a medial to lateral approach. Specimen radiograph was performed showing calcifications are present in the tissue samples. Specimens with calcifications are identified for pathology.  Lesion quadrant: Lower inner quadrant  At the conclusion of the procedure, ribbon shaped tissue marker clip was deployed into the biopsy cavity. Follow-up 2-view mammogram was performed and dictated separately.  I met with the patient and we discussed the procedure of ultrasound-guided biopsy, including benefits and alternatives. We discussed the high likelihood of a successful procedure. We discussed the risks of the procedure, including infection, bleeding, tissue injury, clip  migration, and inadequate sampling. Informed written consent was given. The usual time-out protocol was performed immediately prior to the procedure.  Lesion quadrant: Lower outer quadrant  Using sterile technique and 1% lidocaine  and 1% lidocaine  with epinephrine  as local anesthetic, under direct ultrasound visualization, a 14 gauge spring-loaded device was used to perform biopsy of LOWER OUTER QUADRANT using a  lateral to medial approach. At the conclusion of the procedure coil shaped tissue marker clip was deployed into the biopsy cavity. Follow up 2 view mammogram was performed and dictated separately.  Lesion quadrant: Left axilla  Using sterile technique and 1% lidocaine  and 1% lidocaine  with epinephrine  as local anesthetic, under direct ultrasound visualization, a 14 gauge spring-loaded device was used to perform biopsy of left axillary lymph node using a lateral to medial approach. At the conclusion of the procedure [facility] shaped tissue marker clip was deployed into the biopsy cavity. Follow up 2 view mammogram was performed and dictated separately.  IMPRESSION: Status post stereotactic biopsy of the left breast, ultrasound-guided core biopsy of the left breast and ultrasound-guided core biopsy of a left axillary lymph node.  Electronically Signed: By: Dina  Arceo M.D. On: 08/05/2023 11:18    ASSESSMENT & PLAN:  Patient is a 83 y.o. female presenting for newly diagnosed left breast carcinoma.  Assessment & Plan Carcinoma of lower-outer quadrant of left breast in female, estrogen receptor positive (HCC) Patient with a history of right breast carcinoma status postmastectomy and radiation now has recurrent/new left breast carcinoma ER positive, PR and HER2 negative Assessed by Dr. Kallie.  Considering a big tumor and that it is probably not due to the chest wall, would like to consider neoadjuvant therapy.  - Discussed with the patient that she likely has a new left  breast cancer that is very similar to her right breast cancer that she had previously - Patient does have a big mass that appears matted to the chest wall and lymph node that is positive. - I do not think patient is a good candidate for neoadjuvant chemotherapy.  Will consider starting patient on neoadjuvant endocrine therapy with tamoxifen .  Discussed risk versus benefits in detail and most common side effects including hot flashes and thrombosis.  Will send a prescription for 20 mg daily - Patient has a history of osteoporosis and hence would not consider AI for her - Will obtain a CT chest to assess for the extent of disease - Will refer for genetic counseling as patient has bilateral breast carcinoma at this time - Will discuss patient at tumor board. -Will assess for response in 3 months, if patient has good response we will consider surgery at that point  Return to clinic in 1 month to assess for tolerability of tamoxifen  Age-related osteoporosis without current pathological fracture Patient has a history of osteoporosis and is taking alendronate  Also taking vitamin D  but not taking calcium  as her calcium  levels were high previously  - Will not consider aromatase inhibitor for her treatment options - Will obtain a new DEXA scan -Continue vitamin D  and alendronate     Orders Placed This Encounter  Procedures   DG Bone Density    Standing Status:   Future    Expected Date:   12/16/2023    Expiration Date:   09/01/2024    Reason for Exam (SYMPTOM  OR DIAGNOSIS REQUIRED):   osteoporosis, AI therapy evaluation    Preferred imaging location?:   [facility]    Release to patient:   Immediate   CT Chest W Contrast    Standing Status:   Future    Expiration Date:   09/01/2024    If indicated for the ordered procedure, I authorize the administration of contrast media per Radiology protocol:   Yes    Does the patient have a contrast media/X-ray dye allergy?:   No  Preferred imaging  location?:   [facility]    Release to patient:   Immediate [1]   CBC with Differential    Standing Status:   Future    Expected Date:   10/03/2023    Expiration Date:   01/01/2024   Comprehensive metabolic panel    Standing Status:   Future    Expected Date:   10/03/2023    Expiration Date:   01/01/2024   Ambulatory referral to Genetics    Referral Priority:   Routine    Referral Type:   Consultation    Referral Reason:   Specialty Services Required    Number of Visits Requested:   1    The total time spent in the appointment was 60 minutes encounter with patients including review of chart and various tests results, discussions about plan of care and coordination of care plan   All questions were answered. The patient knows to call the clinic with any problems, questions or concerns. No barriers to learning was detected.  Mickiel Dry, MD 8/11/20254:06 PM

## 2023-09-02 NOTE — Patient Instructions (Addendum)
 [facility]  Discharge Instructions  You were seen and examined today by Dr. Davonna. Dr. Davonna is a medical oncologist, meaning that he specializes in the treatment of cancer diagnoses. Dr. Davonna discussed your past medical history, family history of cancers, and the events that led to you being here today.  You were referred to Dr. Davonna due to a new diagnosis of breast cancer.  Dr. Davonna has recommended starting anti-estrogen therapy with the hopes that it will shrink the tumor/lymph node so that surgery will not be as extensive.  Continue taking Vitamin D . We will ask Dr. Rachele if you can start on Calcium .  We will arrange for you to have a bone density scan around November since you will be due then.  Follow-up as scheduled.  Thank you for choosing [facility] to provide your oncology and hematology care.   To afford each patient quality time with our provider, please arrive at least 15 minutes before your scheduled appointment time. You may need to reschedule your appointment if you arrive late (10 or more minutes). Arriving late affects you and other patients whose appointments are after yours.  Also, if you miss three or more appointments without notifying the office, you may be dismissed from the clinic at the provider's discretion.    Again, thank you for choosing [facility].  Our hope is that these requests will decrease the amount of time that you wait before being seen by our physicians.   If you have a lab appointment with the Cancer Center - please note that after April 8th, all labs will be drawn in the cancer center.  You do not have to check in or register with the main entrance as you have in the past but will complete your check-in at the cancer center.            _____________________________________________________________  Should you have questions after your visit to [facility], please contact our office at [phone] and follow the prompts.  Our office hours are 8:00 a.m. to 4:30 p.m. Monday - Thursday and 8:00 a.m. to 2:30 p.m. Friday.  Please note that voicemails left after 4:00 p.m. may not be returned until the following business day.  We are closed weekends and all major holidays.  You do have access to a nurse 24-7, just call the main number to the clinic [phone] and do not press any options, hold on the line and a nurse will answer the phone.    For prescription refill requests, have your pharmacy contact our office and allow 72 hours.    Masks are no longer required in the cancer centers. If you would like for your care team to wear a mask while they are taking care of you, please let them know. You may have one support person who is at least 81 years old accompany you for your appointments.

## 2023-09-02 NOTE — Assessment & Plan Note (Addendum)
 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 Kallie.  Considering a big tumor and that it is probably not due to the chest wall, would like to consider neoadjuvant therapy.  - Discussed with the patient that she likely has a new left breast cancer that is very similar to her right breast cancer that she had previously - Patient does have a big mass that appears matted to the chest wall and lymph node that is positive. - I do not think patient is a good candidate for neoadjuvant chemotherapy.  Will consider starting patient on neoadjuvant endocrine therapy with tamoxifen .  Discussed risk versus benefits in detail and most common side effects including hot flashes and thrombosis.  Will send a prescription for 20 mg daily - Patient has a history of osteoporosis and hence would not consider AI for her - Will obtain a CT chest to assess for the extent of disease - Will refer for genetic counseling as patient has bilateral breast carcinoma at this time - Will discuss patient at tumor board. -Will assess for response in 3 months, if patient has good response we will consider surgery at that point  Return to clinic in 1 month to assess for tolerability of tamoxifen 

## 2023-09-04 ENCOUNTER — Ambulatory Visit: Admitting: General Surgery

## 2023-09-04 ENCOUNTER — Other Ambulatory Visit: Payer: Self-pay

## 2023-09-04 ENCOUNTER — Encounter: Payer: Self-pay | Admitting: General Surgery

## 2023-09-04 VITALS — BP 124/73 | HR 65 | Temp 97.9°F | Resp 16 | Ht 61.5 in | Wt 108.0 lb

## 2023-09-04 DIAGNOSIS — C50512 Malignant neoplasm of lower-outer quadrant of left female breast: Secondary | ICD-10-CM | POA: Diagnosis not present

## 2023-09-04 DIAGNOSIS — Z170 Estrogen receptor positive status [ER+]: Secondary | ICD-10-CM | POA: Diagnosis not present

## 2023-09-04 NOTE — Patient Instructions (Addendum)
 Continue your anti-estrogen therapy and talk to the Oncology Department/ Dr. Davonna.  We will continue to follow up and discuss the plan with Oncology after the Tumor Board Discussion and your CT scan.  Will plan to see you in October to determine the best options for surgery.

## 2023-09-04 NOTE — Progress Notes (Signed)
 [facility]  Patient has seen Dr. Davonna. Dr. Davonna recommending Tamoxifen  for 3 months before any surgery to see if that could help shrink the mass and the nodes. CT chest ordered also. She is going to discuss the patient at TB next week.   BP 124/73   Pulse 65   Temp 97.9 F (36.6 C) (Oral)   Resp 16   Ht 5' 1.5 (1.562 m)   Wt 108 lb (49 kg)   SpO2 91%   BMI 20.08 kg/m  Normal appearance Breast exam deferred   Patient with left breast cancer with positive nodes in the setting of prior right breast cancer. She is 83 years old and the left breast mass is also matted into the muscle on my exam last week. She is going to try tamoxifen  for 3 months to see if that can shrink the tumor and help with surgical removal.   Patient and her family express understanding. I will be in touch with Dr. Davonna over the next few months to discuss case and any necessary changes.   Cardiology had sent us  a message back and felt like she was appropriate for any surgical intervention needed and she could hold her plavix .   Future Appointments  Date Time Provider Department Center  10/03/2023  9:45 AM Allyn Rattan T [facility] None  10/03/2023 10:30 AM AP-ACAPA LAB [facility] None  10/04/2023  2:00 PM AP-CT 1 AP-CT Eugenio Saenz H  10/10/2023  8:00 AM [name], MD [facility] None  11/12/2023  1:00 PM Miriam Norris, NP [facility]  11/19/2023  9:00 AM Kallie Manuelita BROCKS, MD RS-RS None  12/02/2023 10:30 AM AP-DG DEXA AP-DG ZELDA [facility] H  01/02/2024 10:05 AM Severa Rock HERO, FNP [facility] None  05/12/2024  1:00 PM MC-CV EDEN US  1 CV-EDEN None  07/01/2024 10:00 AM WR[facility]-ANNUAL WELLNESS VISIT [facility] None   Manuelita Kallie, MD [facility] [address] [place] [address] [place], [place] [address] [phone] (office)

## 2023-09-25 ENCOUNTER — Other Ambulatory Visit: Payer: Self-pay | Admitting: Family Medicine

## 2023-10-03 ENCOUNTER — Inpatient Hospital Stay: Admitting: Licensed Clinical Social Worker

## 2023-10-03 ENCOUNTER — Inpatient Hospital Stay: Attending: Oncology

## 2023-10-03 DIAGNOSIS — Z79810 Long term (current) use of selective estrogen receptor modulators (SERMs): Secondary | ICD-10-CM | POA: Diagnosis not present

## 2023-10-03 DIAGNOSIS — R600 Localized edema: Secondary | ICD-10-CM | POA: Diagnosis not present

## 2023-10-03 DIAGNOSIS — R911 Solitary pulmonary nodule: Secondary | ICD-10-CM | POA: Diagnosis not present

## 2023-10-03 DIAGNOSIS — M810 Age-related osteoporosis without current pathological fracture: Secondary | ICD-10-CM | POA: Diagnosis not present

## 2023-10-03 DIAGNOSIS — Z853 Personal history of malignant neoplasm of breast: Secondary | ICD-10-CM | POA: Insufficient documentation

## 2023-10-03 DIAGNOSIS — Z170 Estrogen receptor positive status [ER+]: Secondary | ICD-10-CM | POA: Diagnosis not present

## 2023-10-03 DIAGNOSIS — Z923 Personal history of irradiation: Secondary | ICD-10-CM | POA: Insufficient documentation

## 2023-10-03 DIAGNOSIS — C50512 Malignant neoplasm of lower-outer quadrant of left female breast: Secondary | ICD-10-CM | POA: Diagnosis not present

## 2023-10-03 DIAGNOSIS — Z9011 Acquired absence of right breast and nipple: Secondary | ICD-10-CM | POA: Insufficient documentation

## 2023-10-03 LAB — COMPREHENSIVE METABOLIC PANEL WITH GFR
ALT: 14 U/L (ref 0–44)
AST: 22 U/L (ref 15–41)
Albumin: 4.1 g/dL (ref 3.5–5.0)
Alkaline Phosphatase: 40 U/L (ref 38–126)
Anion gap: 14 (ref 5–15)
BUN: 14 mg/dL (ref 8–23)
CO2: 24 mmol/L (ref 22–32)
Calcium: 9.1 mg/dL (ref 8.9–10.3)
Chloride: 102 mmol/L (ref 98–111)
Creatinine, Ser: 0.98 mg/dL (ref 0.44–1.00)
GFR, Estimated: 58 mL/min — ABNORMAL LOW (ref >60)
Glucose, Bld: 120 mg/dL — ABNORMAL HIGH (ref 70–99)
Potassium: 3.9 mmol/L (ref 3.5–5.1)
Sodium: 140 mmol/L (ref 135–145)
Total Bilirubin: 0.9 mg/dL (ref 0.0–1.2)
Total Protein: 7.4 g/dL (ref 6.5–8.1)

## 2023-10-03 LAB — CBC WITH DIFFERENTIAL/PLATELET
Abs Immature Granulocytes: 0.01 K/uL (ref 0.00–0.07)
Basophils Absolute: 0.1 K/uL (ref 0.0–0.1)
Basophils Relative: 1 %
Eosinophils Absolute: 0.2 K/uL (ref 0.0–0.5)
Eosinophils Relative: 2 %
HCT: 42.2 % (ref 36.0–46.0)
Hemoglobin: 14.1 g/dL (ref 12.0–15.0)
Immature Granulocytes: 0 %
Lymphocytes Relative: 31 %
Lymphs Abs: 2.6 K/uL (ref 0.7–4.0)
MCH: 30.9 pg (ref 26.0–34.0)
MCHC: 33.4 g/dL (ref 30.0–36.0)
MCV: 92.5 fL (ref 80.0–100.0)
Monocytes Absolute: 0.6 K/uL (ref 0.1–1.0)
Monocytes Relative: 7 %
Neutro Abs: 5 K/uL (ref 1.7–7.7)
Neutrophils Relative %: 59 %
Platelets: 219 K/uL (ref 150–400)
RBC: 4.56 MIL/uL (ref 3.87–5.11)
RDW: 12.5 % (ref 11.5–15.5)
WBC: 8.4 K/uL (ref 4.0–10.5)
nRBC: 0 % (ref 0.0–0.2)

## 2023-10-04 ENCOUNTER — Ambulatory Visit (HOSPITAL_COMMUNITY)
Admission: RE | Admit: 2023-10-04 | Discharge: 2023-10-04 | Disposition: A | Discharge status: Home / Self Care | Source: Ambulatory Visit | Attending: Oncology | Admitting: Oncology

## 2023-10-04 ENCOUNTER — Ambulatory Visit (HOSPITAL_COMMUNITY)

## 2023-10-04 DIAGNOSIS — J479 Bronchiectasis, uncomplicated: Secondary | ICD-10-CM | POA: Diagnosis not present

## 2023-10-04 DIAGNOSIS — J432 Centrilobular emphysema: Secondary | ICD-10-CM | POA: Diagnosis not present

## 2023-10-04 DIAGNOSIS — R911 Solitary pulmonary nodule: Secondary | ICD-10-CM | POA: Diagnosis not present

## 2023-10-04 DIAGNOSIS — Z170 Estrogen receptor positive status [ER+]: Secondary | ICD-10-CM | POA: Diagnosis not present

## 2023-10-04 DIAGNOSIS — R918 Other nonspecific abnormal finding of lung field: Secondary | ICD-10-CM | POA: Diagnosis not present

## 2023-10-04 DIAGNOSIS — C50512 Malignant neoplasm of lower-outer quadrant of left female breast: Secondary | ICD-10-CM | POA: Diagnosis not present

## 2023-10-04 MED ORDER — IOHEXOL 300 MG/ML  SOLN
75.0000 mL | Freq: Once | INTRAMUSCULAR | Status: AC | PRN
  Administered 2023-10-04: 75 mL via INTRAVENOUS

## 2023-10-10 ENCOUNTER — Inpatient Hospital Stay: Admitting: Oncology

## 2023-10-10 ENCOUNTER — Other Ambulatory Visit (HOSPITAL_COMMUNITY): Payer: Self-pay | Admitting: Oncology

## 2023-10-10 ENCOUNTER — Ambulatory Visit: Admitting: Oncology

## 2023-10-10 VITALS — BP 116/65 | HR 59 | Temp 97.5°F | Resp 20 | Wt 111.0 lb

## 2023-10-10 DIAGNOSIS — C50512 Malignant neoplasm of lower-outer quadrant of left female breast: Secondary | ICD-10-CM

## 2023-10-10 DIAGNOSIS — M810 Age-related osteoporosis without current pathological fracture: Secondary | ICD-10-CM | POA: Diagnosis not present

## 2023-10-10 DIAGNOSIS — Z853 Personal history of malignant neoplasm of breast: Secondary | ICD-10-CM | POA: Diagnosis not present

## 2023-10-10 DIAGNOSIS — Z9011 Acquired absence of right breast and nipple: Secondary | ICD-10-CM | POA: Diagnosis not present

## 2023-10-10 DIAGNOSIS — R600 Localized edema: Secondary | ICD-10-CM | POA: Diagnosis not present

## 2023-10-10 DIAGNOSIS — R911 Solitary pulmonary nodule: Secondary | ICD-10-CM | POA: Insufficient documentation

## 2023-10-10 DIAGNOSIS — Z923 Personal history of irradiation: Secondary | ICD-10-CM | POA: Diagnosis not present

## 2023-10-10 DIAGNOSIS — Z170 Estrogen receptor positive status [ER+]: Secondary | ICD-10-CM

## 2023-10-10 MED ORDER — TAMOXIFEN CITRATE 20 MG PO TABS: 20.0000 mg | ORAL_TABLET | Freq: Every day | ORAL | 3 refills | Status: AC

## 2023-10-10 NOTE — Assessment & Plan Note (Addendum)
 Patient has a history of osteoporosis and is taking alendronate  Also taking vitamin D  but not taking calcium  as her calcium  levels were high previously  - Will not consider aromatase inhibitor for her treatment options - Will obtain a new DEXA scan- scheduled on 12/02/2023 -Continue vitamin D  and alendronate 

## 2023-10-10 NOTE — Assessment & Plan Note (Addendum)
 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 Kallie.  Considering a big tumor and that it is probably adherent to the chest wall, would like to consider neoadjuvant therapy. 09/02/2023: Started on neoadjuvant Tamoxifen  as patient is not a candidate for chemotherapy (frail)  -Patient tolerated tamoxifen  well with hot flashes.  But she ran out of medication 1 week ago and did not refill.  Will send prescription today for tamoxifen  20 mg daily -We reviewed the CT scan results together.  Patient does have clustered small but asymmetric left axillary lymph and also a 6 mm left upper lobe pulmonary nodule. - Will refer for genetic counseling as patient has bilateral breast carcinoma at this time. It was previously scheduled, but patient cancelled.  -Will assess for response in 3 months, if patient has good response we will consider surgery at that point. Will repeat a mammogram or breast MRI for this  Return to clinic in 2 months  for follow up

## 2023-10-10 NOTE — Assessment & Plan Note (Addendum)
 6mm left upper lobe pulmonary nodule on recent CT scan  - We reviewed the CT scan findings in detail.   - Will continue to monitor this lung nodule and repeat a CT scan in 6 months.

## 2023-10-10 NOTE — Progress Notes (Addendum)
 Patient Care Team: Rakes, Rock HERO, FNP as PCP - General (Family Medicine) Debera Jayson MATSU, MD as PCP - Cardiology (Cardiology) Rachele Gaynell RAMAN, MD as Consulting Physician (Nephrology) Darron Deatrice LABOR, MD as Consulting Physician (Cardiology) Billee Mliss BIRCH, [facility] as [facility] Care Management (Pharmacist) Darron Deatrice LABOR, MD as Consulting Physician (Cardiology) Vicci Mcardle, [place] (Optometry) [name], MD as Medical Oncologist (Medical Oncology) Celestia Joesph SQUIBB, RN as Oncology Nurse Navigator (Medical Oncology)  Clinic Day:  10/10/2023  Referring physician: Severa Rock HERO, FNP   CHIEF COMPLAINT:  CC: Breast carcinoma    ASSESSMENT & PLAN:   Assessment & Plan: Tamara King  is a 83 y.o. 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 Kallie.  Considering a big tumor and that it is probably adherent to the chest wall, would like to consider neoadjuvant therapy. 09/02/2023: Started on neoadjuvant Tamoxifen  as patient is not a candidate for chemotherapy (frail)  -Patient tolerated tamoxifen  well with hot flashes.  But she ran out of medication 1 week ago and did not refill.  Will send prescription today for tamoxifen  20 mg daily -We reviewed the CT scan results together.  Patient does have clustered small but asymmetric left axillary lymph and also a 6 mm left upper lobe pulmonary nodule. - Will refer for genetic counseling as patient has bilateral breast carcinoma at this time. It was previously scheduled, but patient cancelled.  -Will assess for response in 3 months, if patient has good response we will consider surgery at that point. Will repeat a mammogram or breast MRI for  this  Return to clinic in 2 months  for follow up Age-related osteoporosis without current pathological fracture Patient has a history of osteoporosis and is taking alendronate  Also taking vitamin D  but not taking calcium  as her calcium  levels were high previously  - Will not consider aromatase inhibitor for her treatment options - Will obtain a new DEXA scan- scheduled on 12/02/2023 -Continue vitamin D  and alendronate  Lung nodule 6mm left upper lobe pulmonary nodule on recent CT scan  - We reviewed the CT scan findings in detail.   - Will continue to monitor this lung nodule and repeat a CT scan in 6 months.    The patient understands the plans discussed today and is in agreement with them.  She knows to contact our office if she develops concerns prior to her next appointment.  I provided 30 minutes of face-to-face time during this encounter and > 50% was spent counseling as documented under my assessment and plan.    Tamara King,acting as a Neurosurgeon for [name], MD.,have documented all relevant documentation on the behalf of [name], MD,as directed by  [name], MD while in the presence of [name], MD.  I, [name] MD, have reviewed the above documentation for accuracy and completeness, and I agree with the above.     [name], MD  [facility]  - A DEPT OF [facility] [address] [place] [address] Dept: [phone] Dept Fax: [phone]   Orders Placed This Encounter  Procedures   MM DIAG BREAST TOMO UNI LEFT    Standing Status:   Future  Expected Date:   12/02/2023    Expiration Date:   10/09/2024    Reason for Exam (SYMPTOM  OR DIAGNOSIS REQUIRED):   breast cancer    Preferred imaging location?:   [facility]     ONCOLOGY HISTORY:   I have reviewed her chart and materials related to her cancer extensively and collaborated history with  the patient. Summary of oncologic history is as follows:  Diagnosis: Left breast carcinoma   -08/15/2004: Right breast core biopsy: Invasive ductal carcinoma.  ER: Positive, 80%, PR: Positive, 90%, Ki-67: 7%, HER2: Negative, 1+ at [facility] -08/25/2004: Right breast partial mastectomy: Moderately differentiated invasive ductal carcinoma.  SLNB: Negative for carcinoma, pT1b,pN0,pMx -As per documentation received adjuvant radiation.  Did not receive endocrine therapy but was taken off of estrogen -07/25/2023: Mammogram: Left: 2 oval masses with microlobulated and irregular margins seen in the lower outer left breast corresponding to the palpable abnormality.  The larger mass measures 2.8 X 1.4 X 2.5 cm.  The smaller mass measures 1.3 X 0.9 X 1.2 cm.  1.4 cm group of heterogeneous and linear calcifications seen in the lower inner left breast, middle to posterior depth. -08/05/2023: Left breast core needle biopsy: Invasive mammary carcinoma, no special type(ductal).  Overall grade 2.  Lymphovascular invasion not identified.  Left axillary lymph node biopsy: Invasive mammary carcinoma.  ER: Positive, 100%, PR: Negative, HER2: Negative, 0.  Ki-67: 15% -09/02/2023-Current: Tamoxifen  20mg  daily -10/04/2023: CT chest: 6 mm left upper lobe pulmonary nodule is new in the interval (since 2018). Clustered small but asymmetric left axillary lymph nodes show haziness in the adjacent fat. By report, the patient has known node positive disease. Bandlike area of volume loss and subtle bronchiectasis in the anterior right lung suggests scarring. 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   Current Treatment:  Neoadjuvant Tamoxifen   INTERVAL HISTORY:   Tamara King is here today for follow up. Patient is accompanied by her sister-in-law today.  She states she took Tamoxifen  as prescribed for one month before finishing her first bottle on 10/02/2023 and discontinuing the  medication, as she was not sure if she should continue taking it. Tamara King denies any side effects from Tamoxifen , including hot flashes. We will restart her on Tamoxifen , as her treatment plan is to take the medication for atleast 6 months and reassess.   Tamara King reports bilateral lower extremity edema she attributes to varicose veins. She does not wear compression socks, as she states she is unable to tolerate them.   Tamara King notes occasional soreness on the left breast, worsened on palpation, as well as pain along the lower outer quadrant of the right breast on palpation. She does have a prior surgical history to the right breast.   I have reviewed the past medical history, past surgical history, social history and family history with the patient and they are unchanged from previous note.  ALLERGIES:  is allergic to losartan, sulfa antibiotics, codeine, fish allergy, other, spironolactone , vancomycin, and penicillins.  MEDICATIONS:  Current Outpatient Medications  Medication Sig Dispense Refill   acetaminophen  (TYLENOL ) 500 MG tablet Take 500 mg by mouth every 6 (six) hours as needed for moderate pain.     alendronate  (FOSAMAX ) 70 MG tablet TAKE 1 TABLET BY MOUTH ONCE A WEEK WITH  A  FULL  GLASS  OF  WATER  ON  AN  EMPTY  STOMACH 12 tablet 0   amLODipine  (NORVASC ) 5 MG tablet Take 1 tablet (5  mg total) by mouth daily. 90 tablet 1   carvedilol  (COREG ) 25 MG tablet Take 1 tablet (25 mg total) by mouth 2 (two) times daily. 180 tablet 1   cetirizine  (EQ ALLERGY RELIEF, CETIRIZINE ,) 10 MG tablet Take 1 tablet by mouth once daily 90 tablet 1   Cholecalciferol (VITAMIN D3) 25 MCG (1000 UT) CAPS Take 1 capsule by mouth daily.     clopidogrel  (PLAVIX ) 75 MG tablet Take 1 tablet (75 mg total) by mouth daily. 90 tablet 1   famotidine  (PEPCID ) 20 MG tablet Take 1 tablet (20 mg total) by mouth daily. 90 tablet 1   furosemide  (LASIX ) 40 MG tablet Take 1 tablet (40 mg total) by mouth 2 (two) times daily. 180  tablet 1   ondansetron  (ZOFRAN ) 4 MG tablet Take 1 tablet (4 mg total) by mouth every 8 (eight) hours as needed for nausea or vomiting. 20 tablet 0   pantoprazole  (PROTONIX ) 40 MG tablet Take 1 tablet by mouth once daily 90 tablet 1   potassium chloride  SA (KLOR-CON  M) 20 MEQ tablet Take 1 tablet (20 mEq total) by mouth 2 (two) times daily. 180 tablet 1   rosuvastatin  (CRESTOR ) 40 MG tablet Take 1 tablet (40 mg total) by mouth daily. 90 tablet 1   tamoxifen  (NOLVADEX ) 20 MG tablet Take 1 tablet (20 mg total) by mouth daily. 90 tablet 3   No current facility-administered medications for this visit.    REVIEW OF SYSTEMS:   Constitutional: Denies fevers, chills or abnormal weight loss Eyes: Denies blurriness of vision Ears, nose, mouth, throat, and face: Denies mucositis or sore throat Respiratory: Denies cough, dyspnea or wheezes Cardiovascular: Denies palpitation, chest discomfort or lower extremity swelling Gastrointestinal:  Denies nausea, heartburn or change in bowel habits Skin: Denies abnormal skin rashes Lymphatics: Denies new lymphadenopathy or easy bruising Neurological:Denies numbness, tingling or new weaknesses Behavioral/Psych: Mood is stable, no new changes  All other systems were reviewed with the patient and are negative.   VITALS:  Blood pressure 116/65, pulse (!) 59, temperature (!) 97.5 F (36.4 C), temperature source Oral, resp. rate 20, weight 111 lb (50.3 kg), SpO2 98%.  Wt Readings from Last 3 Encounters:  10/10/23 111 lb (50.3 kg)  09/04/23 108 lb (49 kg)  09/02/23 107 lb 12.9 oz (48.9 kg)    Body mass index is 20.63 kg/m.  Performance status (ECOG): 2 - Symptomatic, <50% confined to bed  PHYSICAL EXAM:   GENERAL:alert, no distress and comfortable NECK: supple, thyroid  normal size, non-tender, without nodularity BREAST: Right breast: Scar tissue in the lower outer quadrant, tender.  Left breast: Palpable left lower outer quadrant breast mass 4 cm into 1  cm (smaller than prior), firm, hard, not matted. Tender. Not palpable left axillary lymph node anymore.  LYMPH:  no palpable lymphadenopathy in the cervical, axillary or inguinal LUNGS: clear to auscultation and percussion with normal breathing effort HEART: regular rate & rhythm and no murmurs and no lower extremity edema ABDOMEN:abdomen soft, non-tender and normal bowel sounds Musculoskeletal:no cyanosis of digits and no clubbing  NEURO: alert & oriented x 3 with fluent speech, no focal motor/sensory deficits  LABORATORY DATA:  I have reviewed the data as listed    Component Value Date/Time   NA 140 10/03/2023 0939   NA 138 07/03/2023 1041   K 3.9 10/03/2023 0939   CL 102 10/03/2023 0939   CO2 24 10/03/2023 0939   GLUCOSE 120 (H) 10/03/2023 0939   BUN 14 10/03/2023 0939  BUN 15 07/03/2023 1041   CREATININE 0.98 10/03/2023 0939   CALCIUM  9.1 10/03/2023 0939   PROT 7.4 10/03/2023 0939   PROT 6.8 07/03/2023 1041   ALBUMIN 4.1 10/03/2023 0939   ALBUMIN 4.5 07/03/2023 1041   AST 22 10/03/2023 0939   ALT 14 10/03/2023 0939   ALKPHOS 40 10/03/2023 0939   BILITOT 0.9 10/03/2023 0939   BILITOT 0.6 07/03/2023 1041   GFRNONAA 58 (L) 10/03/2023 0939   GFRAA 55 (L) 05/12/2019 1140    Lab Results  Component Value Date   WBC 8.4 10/03/2023   NEUTROABS 5.0 10/03/2023   HGB 14.1 10/03/2023   HCT 42.2 10/03/2023   MCV 92.5 10/03/2023   PLT 219 10/03/2023     RADIOGRAPHIC STUDIES: I have personally reviewed the radiological images as listed and agreed with the findings in the report.  CT Chest W Contrast CLINICAL DATA:  Breast cancer staging.  * Tracking Code: BO *  EXAM: CT CHEST WITH CONTRAST  TECHNIQUE: Multidetector CT imaging of the chest was performed during intravenous contrast administration.  RADIATION DOSE REDUCTION: This exam was performed according to the departmental dose-optimization program which includes automated exposure control, adjustment of the mA  and/or kV according to patient size and/or use of iterative reconstruction technique.  CONTRAST:  75mL OMNIPAQUE  IOHEXOL  300 MG/ML  SOLN  COMPARISON:  Chest CTA 06/17/2016  FINDINGS: Cardiovascular: The heart size is normal. No substantial pericardial effusion. Coronary artery calcification is evident. Moderate atherosclerotic calcification is noted in the wall of the thoracic aorta.  Mediastinum/Nodes: No mediastinal lymphadenopathy. There is no hilar lymphadenopathy. The esophagus has normal imaging features. No right axillary lymphadenopathy. Clustered asymmetric but small left axillary lymph nodes show haziness in the adjacent fat.  Lungs/Pleura: Centrilobular and paraseptal emphysema evident. Subpleural reticulation noted anterior right upper lobe. Bandlike area of volume loss in subtle bronchiectasis in the anterior right lung (image 82/3) suggest scarring. 6 mm left upper lobe pulmonary nodule on 71/3 is new in the interval. No pleural effusion.  Upper Abdomen: Cortical scarring noted in the visualized portion of the upper kidneys bilaterally.  Musculoskeletal: No worrisome lytic or sclerotic osseous abnormality.  IMPRESSION: 1. 6 mm left upper lobe pulmonary nodule is new in the interval. Continued close follow-up recommended. 2. Clustered small but asymmetric left axillary lymph nodes show haziness in the adjacent fat. By report, the patient has known node positive disease. 3. Bandlike area of volume loss and subtle bronchiectasis in the anterior right lung suggests scarring. 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 4. Aortic Atherosclerosis (ICD10-I70.0) and Emphysema (ICD10-J43.9).  Electronically Signed   By: Camellia Candle M.D.   On: 10/09/2023 08:52

## 2023-10-10 NOTE — Patient Instructions (Signed)
 [facility] at [facility] Discharge Instructions   You were seen and examined today by Dr. Davonna.  She reviewed the results of your lab work which are normal/stable.   We will see you back in 2 months. We will repeat lab work prior to this visit.   Return as scheduled.    Thank you for choosing [facility] at [facility] to provide your oncology and hematology care.  To afford each patient quality time with our provider, please arrive at least 15 minutes before your scheduled appointment time.   If you have a lab appointment with the Cancer Center please come in thru the Main Entrance and check in at the main information desk.  You need to re-schedule your appointment should you arrive 10 or more minutes late.  We strive to give you quality time with our providers, and arriving late affects you and other patients whose appointments are after yours.  Also, if you no show three or more times for appointments you may be dismissed from the clinic at the providers discretion.     Again, thank you for choosing [facility].  Our hope is that these requests will decrease the amount of time that you wait before being seen by our physicians.       _____________________________________________________________  Should you have questions after your visit to [facility], please contact our office at [phone] and follow the prompts.  Our office hours are 8:00 a.m. and 4:30 p.m. Monday - Friday.  Please note that voicemails left after 4:00 p.m. may not be returned until the following business day.  We are closed weekends and major holidays.  You do have access to a nurse 24-7, just call the main number to the clinic [phone] and do not press any options, hold on the line and a nurse will answer the phone.    For prescription refill requests, have your pharmacy contact our office and allow 72 hours.    Due to Covid, you will  need to wear a mask upon entering the hospital. If you do not have a mask, a mask will be given to you at the Main Entrance upon arrival. For doctor visits, patients may have 1 support person age 52 or older with them. For treatment visits, patients can not have anyone with them due to social distancing guidelines and our immunocompromised population.

## 2023-10-31 ENCOUNTER — Other Ambulatory Visit

## 2023-10-31 ENCOUNTER — Encounter: Payer: Self-pay | Admitting: Family Medicine

## 2023-10-31 ENCOUNTER — Ambulatory Visit: Payer: Self-pay | Admitting: Family Medicine

## 2023-10-31 ENCOUNTER — Ambulatory Visit: Admitting: Family Medicine

## 2023-10-31 ENCOUNTER — Ambulatory Visit (INDEPENDENT_AMBULATORY_CARE_PROVIDER_SITE_OTHER)

## 2023-10-31 VITALS — BP 168/73 | Temp 97.4°F | Ht 61.5 in | Wt 106.2 lb

## 2023-10-31 DIAGNOSIS — S40011A Contusion of right shoulder, initial encounter: Secondary | ICD-10-CM

## 2023-10-31 DIAGNOSIS — M19021 Primary osteoarthritis, right elbow: Secondary | ICD-10-CM | POA: Diagnosis not present

## 2023-10-31 DIAGNOSIS — E119 Type 2 diabetes mellitus without complications: Secondary | ICD-10-CM | POA: Diagnosis not present

## 2023-10-31 DIAGNOSIS — M19041 Primary osteoarthritis, right hand: Secondary | ICD-10-CM

## 2023-10-31 DIAGNOSIS — M19011 Primary osteoarthritis, right shoulder: Secondary | ICD-10-CM

## 2023-10-31 DIAGNOSIS — I10 Essential (primary) hypertension: Secondary | ICD-10-CM | POA: Diagnosis not present

## 2023-10-31 DIAGNOSIS — M25511 Pain in right shoulder: Secondary | ICD-10-CM | POA: Diagnosis not present

## 2023-10-31 DIAGNOSIS — M79601 Pain in right arm: Secondary | ICD-10-CM | POA: Diagnosis not present

## 2023-10-31 DIAGNOSIS — Z23 Encounter for immunization: Secondary | ICD-10-CM | POA: Diagnosis not present

## 2023-10-31 DIAGNOSIS — R809 Proteinuria, unspecified: Secondary | ICD-10-CM | POA: Diagnosis not present

## 2023-10-31 DIAGNOSIS — N189 Chronic kidney disease, unspecified: Secondary | ICD-10-CM | POA: Diagnosis not present

## 2023-10-31 DIAGNOSIS — D631 Anemia in chronic kidney disease: Secondary | ICD-10-CM | POA: Diagnosis not present

## 2023-10-31 DIAGNOSIS — M25531 Pain in right wrist: Secondary | ICD-10-CM | POA: Diagnosis not present

## 2023-10-31 DIAGNOSIS — M79631 Pain in right forearm: Secondary | ICD-10-CM | POA: Diagnosis not present

## 2023-10-31 MED ORDER — ACETAMINOPHEN 500 MG PO TABS: 1000.0000 mg | ORAL_TABLET | Freq: Three times a day (TID) | ORAL | 99 refills | Status: AC

## 2023-10-31 NOTE — Progress Notes (Signed)
 Subjective:  Patient ID: Tamara King, female    DOB: 27-Feb-1940  Age: 83 y.o. MRN: [id number]  CC: right arm pain (Fell 3 weeks ago)   HPI  Discussed the use of AI scribe software for clinical note transcription with the patient, who gave verbal consent to proceed.  History of Present Illness Tamara King is an 83 year old female with a history of shoulder surgery and chronic shoulder pain who presents with right arm pain following a fall.  Three weeks ago, she experienced a fall while shopping, landing on her right side. Initially, she did not experience significant pain, but over the past week, she has developed severe pain radiating down her right arm.  The pain is described as shooting down the arm, making it difficult for her to perform daily activities. She can move her arm but prefers not to due to the pain. She denies any pain when her wrist is moved, but notes that the pain radiates from her shoulder down to her hand.  She is able to bend her elbow, but attempts to do so without causing pain. There is no significant pain when her elbow is tapped.          10/31/2023    9:46 AM 10/10/2023    8:10 AM 09/02/2023    1:15 PM  Depression screen PHQ 2/9  Decreased Interest 0 0 0  Down, Depressed, Hopeless 0 0 0  PHQ - 2 Score 0 0 0    History Kateri has a past medical history of Breast cancer (HCC) (2006), Chronic edema, CKD (chronic kidney disease) stage 3, GFR 30-59 ml/min (HCC) (10/22/2018), Coronary atherosclerosis of native coronary artery, Essential hypertension, GERD (gastroesophageal reflux disease) (07/06/2013), Idiopathic peripheral neuropathy (10/06/2013), Mixed hyperlipidemia, Occlusion and stenosis of left carotid artery (10/22/2017), Personal history of radiation therapy (2006), Renal artery stenosis, Sleep apnea, Stroke (HCC), and Symptomatic varicose veins, right (10/16/2018).   She has a past surgical history that includes Hemorrhoid surgery; Rotator cuff  repair (2008); Right breast lumpectomy (Right, 2006); Cesarean section; Appendectomy; Cataract extraction w/PHACO (03/29/2011); Cataract extraction w/PHACO (04/16/2011); Upper Extremity Angiography (N/A, 11/27/2017); Cholecystectomy (2018); RENAL ANGIOGRAPHY (N/A, 05/20/2019); RENAL INTERVENTION (05/20/2019); Breast surgery (Left); Breast biopsy (Left, 08/05/2023); Breast biopsy (Left, 08/05/2023); and Breast biopsy (Left, 08/05/2023).   Her family history includes Brain cancer in her brother and sister; Breast cancer in her paternal aunt; COPD in her sister; Cancer in her son; Diabetes in her brother, brother, father, sister, son, and son; Heart failure in her mother; Lung cancer in her father; Stroke (age of onset: 29) in her mother; Thyroid  cancer in her son.She reports that she quit smoking about 25 years ago. Her smoking use included cigarettes. She started smoking about 60 years ago. She has a 35 pack-year smoking history. She has never used smokeless tobacco. She reports that she does not drink alcohol and does not use drugs.    ROS Review of Systems  Constitutional: Negative.   HENT: Negative.    Eyes:  Negative for visual disturbance.  Respiratory:  Negative for shortness of breath.   Cardiovascular:  Negative for chest pain.  Gastrointestinal:  Negative for abdominal pain.  Musculoskeletal:  Positive for arthralgias (RUE, shoulder to hand).    Objective:  BP (!) 168/73   Temp (!) 97.4 F (36.3 C) (Oral)   Ht 5' 1.5 (1.562 m)   Wt 106 lb 3.2 oz (48.2 kg)   SpO2 96%   BMI 19.74 kg/m  BP Readings from Last 3 Encounters:  10/31/23 (!) 168/73  10/10/23 116/65  09/04/23 124/73    Wt Readings from Last 3 Encounters:  10/31/23 106 lb 3.2 oz (48.2 kg)  10/10/23 111 lb (50.3 kg)  09/04/23 108 lb (49 kg)     Physical Exam Physical Exam GENERAL: Alert, cooperative, well developed, no acute distress. HEENT: Normocephalic, normal oropharynx, moist mucous membranes. CHEST: Clear  to auscultation bilaterally, no wheezes, rhonchi, or crackles. CARDIOVASCULAR: Normal heart rate and rhythm, S1 and S2 normal without murmurs. ABDOMEN: Soft, non-tender, non-distended, without organomegaly, normal bowel sounds. EXTREMITIES: No cyanosis or edema. MUSCULOSKELETAL: No tenderness on palpation of right elbow, large bruise on right elbow,diminished range of motion in right elbow and shoulder. NEUROLOGICAL: Cranial nerves grossly intact, moves all extremities without gross motor or sensory deficit.   Assessment & Plan:  Right arm pain -     Flu vaccine HIGH DOSE PF(Fluzone Trivalent) -     DG Forearm Right; Future -     DG Hand Complete Right; Future -     DG Shoulder Right; Future -     DG Wrist Complete Right; Future -     Acetaminophen ; Take 2 tablets (1,000 mg total) by mouth 3 (three) times daily.  Dispense: 180 tablet; Refill: PRN  Encounter for immunization  Arthritis of right upper arm -     Acetaminophen ; Take 2 tablets (1,000 mg total) by mouth 3 (three) times daily.  Dispense: 180 tablet; Refill: PRN  Arthritis of right shoulder -     DG Shoulder Right; Future -     Acetaminophen ; Take 2 tablets (1,000 mg total) by mouth 3 (three) times daily.  Dispense: 180 tablet; Refill: PRN  Arthritis of right hand -     DG Hand Complete Right; Future -     DG Wrist Complete Right; Future -     Acetaminophen ; Take 2 tablets (1,000 mg total) by mouth 3 (three) times daily.  Dispense: 180 tablet; Refill: PRN  Contusion of right shoulder, initial encounter -     Acetaminophen ; Take 2 tablets (1,000 mg total) by mouth 3 (three) times daily.  Dispense: 180 tablet; Refill: PRN    Assessment and Plan Assessment & Plan Right upper extremity pain after fall   She experiences right upper extremity pain following a fall three weeks ago, with discomfort from the shoulder down the arm, worsened by movement. Pain began about a week ago. X-rays from shoulder to hand show no fractures  or dislocations. Review x-ray results with her.  Chronic right shoulder rotator cuff tear with severe osteoarthritis   She has a chronic rotator cuff tear with severe osteoarthritis in the right shoulder. X-rays reveal acromioclavicular and glenohumeral arthrosis, with no acute fractures or dislocations.  Severe right hand and wrist osteoarthritis   She suffers from severe osteoarthritis in the right hand and wrist, especially in the thumb basal joints. X-rays show no fractures.       Follow-up: Return in about 2 weeks (around 11/14/2023), or if symptoms worsen or fail to improve.  Butler Der, M.D.

## 2023-11-07 DIAGNOSIS — R809 Proteinuria, unspecified: Secondary | ICD-10-CM | POA: Diagnosis not present

## 2023-11-07 DIAGNOSIS — N1832 Chronic kidney disease, stage 3b: Secondary | ICD-10-CM | POA: Diagnosis not present

## 2023-11-07 DIAGNOSIS — I129 Hypertensive chronic kidney disease with stage 1 through stage 4 chronic kidney disease, or unspecified chronic kidney disease: Secondary | ICD-10-CM | POA: Diagnosis not present

## 2023-11-07 DIAGNOSIS — N2581 Secondary hyperparathyroidism of renal origin: Secondary | ICD-10-CM | POA: Diagnosis not present

## 2023-11-12 ENCOUNTER — Ambulatory Visit: Admitting: Nurse Practitioner

## 2023-11-19 ENCOUNTER — Ambulatory Visit: Admitting: General Surgery

## 2023-11-19 ENCOUNTER — Encounter: Payer: Self-pay | Admitting: General Surgery

## 2023-11-19 VITALS — BP 138/71 | HR 63 | Temp 98.2°F | Resp 12 | Ht 61.5 in | Wt 109.0 lb

## 2023-11-19 DIAGNOSIS — Z170 Estrogen receptor positive status [ER+]: Secondary | ICD-10-CM | POA: Diagnosis not present

## 2023-11-19 DIAGNOSIS — C50512 Malignant neoplasm of lower-outer quadrant of left female breast: Secondary | ICD-10-CM

## 2023-11-19 NOTE — Patient Instructions (Addendum)
 Will follow up with Dr. Armanda recommendations in November.  Exam today without obvious mass which means the treatment is working.  Will see what Dr. Davonna thinks about surgery once she gets the imaging.

## 2023-11-19 NOTE — Progress Notes (Signed)
 [facility]  No major issues. Does not feel the mass anymore. Has been taking her treatment.   BP 138/71   Pulse 63   Temp 98.2 F (36.8 C) (Other (Comment))   Resp 12   Ht 5' 1.5 (1.562 m)   Wt 109 lb (49.4 kg)   SpO2 95%   BMI 20.26 kg/m  No palpable mass on the left or in the axilla   Patient with left breast cancer and nodal disease and prior right breast cancer. She is getting tamoxifen  treatment for now and has had significant resolution of the cancer based on exam. She is getting imaging in November.   Will see patient back after her imaging and see what Dr. Davonna thinks.   Future Appointments  Date Time Provider Department Center  12/02/2023 10:30 AM AP-DG DEXA AP-DG [place] [facility]  12/10/2023 10:40 AM AP-MM/VASC US  3 AP-US  [place] [facility]  12/10/2023 10:40 AM AP-MM DIAG AP-MM  H  12/10/2023 11:30 AM AP-ACAPA LAB [facility] None  12/16/2023  1:00 PM [name], MD [facility] None  12/24/2023  9:00 AM Kallie Manuelita BROCKS, MD RS-RS None  12/31/2023  1:20 PM Debera Jayson MATSU, MD [facility]  01/02/2024 10:05 AM Severa Rock HERO, FNP [facility] [address]  05/12/2024  1:00 PM MC-CV EDEN US  1 CV-EDEN None  07/01/2024 10:00 AM [facility]-ANNUAL WELLNESS VISIT [facility] [address]   Manuelita Kallie, MD [facility] [address] [place], [place] [address] [phone] (office)

## 2023-12-02 ENCOUNTER — Other Ambulatory Visit: Payer: Self-pay | Admitting: *Deleted

## 2023-12-02 ENCOUNTER — Inpatient Hospital Stay: Attending: Oncology

## 2023-12-02 ENCOUNTER — Ambulatory Visit (HOSPITAL_COMMUNITY)
Admission: RE | Admit: 2023-12-02 | Discharge: 2023-12-02 | Disposition: A | Discharge status: Home / Self Care | Source: Ambulatory Visit | Attending: Oncology | Admitting: Oncology

## 2023-12-02 DIAGNOSIS — C50512 Malignant neoplasm of lower-outer quadrant of left female breast: Secondary | ICD-10-CM | POA: Insufficient documentation

## 2023-12-02 DIAGNOSIS — M810 Age-related osteoporosis without current pathological fracture: Secondary | ICD-10-CM | POA: Insufficient documentation

## 2023-12-02 DIAGNOSIS — Z170 Estrogen receptor positive status [ER+]: Secondary | ICD-10-CM | POA: Insufficient documentation

## 2023-12-02 DIAGNOSIS — G8929 Other chronic pain: Secondary | ICD-10-CM | POA: Insufficient documentation

## 2023-12-02 DIAGNOSIS — Z79810 Long term (current) use of selective estrogen receptor modulators (SERMs): Secondary | ICD-10-CM | POA: Insufficient documentation

## 2023-12-02 DIAGNOSIS — R911 Solitary pulmonary nodule: Secondary | ICD-10-CM | POA: Insufficient documentation

## 2023-12-02 DIAGNOSIS — M25519 Pain in unspecified shoulder: Secondary | ICD-10-CM | POA: Insufficient documentation

## 2023-12-02 DIAGNOSIS — Z923 Personal history of irradiation: Secondary | ICD-10-CM | POA: Insufficient documentation

## 2023-12-02 DIAGNOSIS — E559 Vitamin D deficiency, unspecified: Secondary | ICD-10-CM | POA: Insufficient documentation

## 2023-12-02 DIAGNOSIS — Z9011 Acquired absence of right breast and nipple: Secondary | ICD-10-CM | POA: Insufficient documentation

## 2023-12-02 LAB — CBC WITH DIFFERENTIAL/PLATELET
Abs Immature Granulocytes: 0.01 K/uL (ref 0.00–0.07)
Basophils Absolute: 0 K/uL (ref 0.0–0.1)
Basophils Relative: 1 %
Eosinophils Absolute: 0.2 K/uL (ref 0.0–0.5)
Eosinophils Relative: 2 %
HCT: 42.2 % (ref 36.0–46.0)
Hemoglobin: 13.7 g/dL (ref 12.0–15.0)
Immature Granulocytes: 0 %
Lymphocytes Relative: 35 %
Lymphs Abs: 2.6 K/uL (ref 0.7–4.0)
MCH: 30.4 pg (ref 26.0–34.0)
MCHC: 32.5 g/dL (ref 30.0–36.0)
MCV: 93.8 fL (ref 80.0–100.0)
Monocytes Absolute: 0.5 K/uL (ref 0.1–1.0)
Monocytes Relative: 7 %
Neutro Abs: 4.2 K/uL (ref 1.7–7.7)
Neutrophils Relative %: 55 %
Platelets: 219 K/uL (ref 150–400)
RBC: 4.5 MIL/uL (ref 3.87–5.11)
RDW: 13.3 % (ref 11.5–15.5)
WBC: 7.6 K/uL (ref 4.0–10.5)
nRBC: 0 % (ref 0.0–0.2)

## 2023-12-02 LAB — COMPREHENSIVE METABOLIC PANEL WITH GFR
ALT: 8 U/L (ref 0–44)
AST: 24 U/L (ref 15–41)
Albumin: 4.6 g/dL (ref 3.5–5.0)
Alkaline Phosphatase: 53 U/L (ref 38–126)
Anion gap: 11 (ref 5–15)
BUN: 13 mg/dL (ref 8–23)
CO2: 27 mmol/L (ref 22–32)
Calcium: 9.4 mg/dL (ref 8.9–10.3)
Chloride: 105 mmol/L (ref 98–111)
Creatinine, Ser: 1.01 mg/dL — ABNORMAL HIGH (ref 0.44–1.00)
GFR, Estimated: 56 mL/min — ABNORMAL LOW (ref >60)
Glucose, Bld: 100 mg/dL — ABNORMAL HIGH (ref 70–99)
Potassium: 4.2 mmol/L (ref 3.5–5.1)
Sodium: 143 mmol/L (ref 135–145)
Total Bilirubin: 0.5 mg/dL (ref 0.0–1.2)
Total Protein: 7.5 g/dL (ref 6.5–8.1)

## 2023-12-02 LAB — VITAMIN D 25 HYDROXY (VIT D DEFICIENCY, FRACTURES): Vit D, 25-Hydroxy: 29.07 ng/mL — ABNORMAL LOW (ref 30–100)

## 2023-12-10 ENCOUNTER — Ambulatory Visit (HOSPITAL_COMMUNITY)
Admission: RE | Admit: 2023-12-10 | Discharge: 2023-12-10 | Disposition: A | Discharge status: Home / Self Care | Source: Ambulatory Visit | Attending: Oncology | Admitting: Oncology

## 2023-12-10 ENCOUNTER — Inpatient Hospital Stay

## 2023-12-10 DIAGNOSIS — C50512 Malignant neoplasm of lower-outer quadrant of left female breast: Secondary | ICD-10-CM | POA: Diagnosis present

## 2023-12-10 DIAGNOSIS — Z170 Estrogen receptor positive status [ER+]: Secondary | ICD-10-CM | POA: Diagnosis present

## 2023-12-15 NOTE — Progress Notes (Unsigned)
 Patient Care Team: Rakes, Rock HERO, FNP as PCP - General (Family Medicine) Debera Jayson MATSU, MD as PCP - Cardiology (Cardiology) Rachele Gaynell RAMAN, MD as Consulting Physician (Nephrology) Darron Deatrice LABOR, MD as Consulting Physician (Cardiology) Billee Mliss BIRCH, [facility] as [facility] Care Management (Pharmacist) Darron Deatrice LABOR, MD as Consulting Physician (Cardiology) Vicci Mcardle, [place] (Optometry) King Siad, MD as Medical Oncologist (Medical Oncology) Celestia Joesph SQUIBB, RN as Oncology Nurse Navigator (Medical Oncology)  Clinic Day:  12/16/2023  Referring physician: Severa Rock HERO, FNP   CHIEF COMPLAINT:  CC: Breast carcinoma    ASSESSMENT & PLAN:   Assessment & Plan: Tamara King  is a 83 y.o. female with breast carcinoma   Carcinoma of lower-outer quadrant of left breast in female, estrogen receptor positive  Patient with a history of right breast carcinoma status postmastectomy and radiation now has recurrent/new left breast carcinoma ER positive, PR and HER2 negative Assessed by Dr. Kallie.  Considering a big tumor and that it is probably adherent to the chest wall, would like to consider neoadjuvant therapy. 09/02/2023: Started on neoadjuvant Tamoxifen  as patient is not a candidate for chemotherapy (frail)   -We reviewed the mammogram findings together.  Patient has significant decrease in the size of the breast carcinoma. -Patient tolerated tamoxifen  well with some hot flashes.  Continue tamoxifen  20 mg daily - Will refer for genetic counseling as patient has bilateral breast carcinoma at this time. It was previously scheduled, but patient cancelled.  - Will reach out to Dr. Kallie to see if patient is a surgical candidate at this time.  If not, will continue tamoxifen  until intolerance or disease progression.   Return to clinic in 2 months  for follow up  Age-related osteoporosis without current pathological fracture Patient has a history of  osteoporosis and is taking alendronate  Also taking vitamin D  but not taking calcium  as her calcium  levels were high previously Repeat DEXA scan consistent with osteoporosis.   - Will not consider aromatase inhibitor for her treatment options -Continue vitamin D  and alendronate   Lung nodule 6mm left upper lobe pulmonary nodule on recent CT scan   - We reviewed the CT scan findings in detail.   - Will continue to monitor this lung nodule and repeat a CT scan in 6 months. Due 03/2024  Vitamin D  deficiency Patient has a vitamin D  level of 29.7  - Will start patient on vitamin D  50,000 IU weekly for 4 doses followed by continuing her current dose of 2000 IU daily   The patient understands the plans discussed today and is in agreement with them.  She knows to contact our office if she develops concerns prior to her next appointment.  The total time spent in the appointment was 21 minutes for the encounter with patient, including review of chart and various tests results, discussions about plan of care and coordination of care plan    Tamara Davonna, MD  [facility] [place] - A DEPT OF [facility] [address] [place] [address] Dept: [phone] Dept Fax: [phone]   Orders Placed This Encounter  Procedures   CT Chest W Contrast    Standing Status:   Future    Expected Date:   03/17/2024    Expiration Date:   12/15/2024    If indicated for the ordered procedure, I authorize the administration of contrast media per Radiology protocol:   Yes    Does  the patient have a contrast media/X-ray dye allergy?:   Yes    Preferred imaging location?:   [facility]     ONCOLOGY HISTORY:   I have reviewed her chart and materials related to her cancer extensively and collaborated history with the patient. Summary of oncologic history is as follows:  Diagnosis: Left breast carcinoma   -08/15/2004: Right breast core  biopsy: Invasive ductal carcinoma.  ER: Positive, 80%, PR: Positive, 90%, Ki-67: 7%, HER2: Negative, 1+ at [facility] -08/25/2004: Right breast partial mastectomy: Moderately differentiated invasive ductal carcinoma.  SLNB: Negative for carcinoma, pT1b,pN0,pMx -As per documentation received adjuvant radiation.  Did not receive endocrine therapy but was taken off of estrogen -07/25/2023: Mammogram: Left: 2 oval masses with microlobulated and irregular margins seen in the lower outer left breast corresponding to the palpable abnormality.  The larger mass measures 2.8 X 1.4 X 2.5 cm.  The smaller mass measures 1.3 X 0.9 X 1.2 cm.  1.4 cm group of heterogeneous and linear calcifications seen in the lower inner left breast, middle to posterior depth. -08/05/2023: Left breast core needle biopsy: Invasive mammary carcinoma, no special type(ductal).  Overall grade 2.  Lymphovascular invasion not identified.  Left axillary lymph node biopsy: Invasive mammary carcinoma.  ER: Positive, 100%, PR: Negative, HER2: Negative, 0.  Ki-67: 15% -09/02/2023-Current: Tamoxifen  20mg  daily -10/04/2023: CT chest: 6 mm left upper lobe pulmonary nodule is new in the interval (since 2018). Clustered small but asymmetric left axillary lymph nodes show haziness in the adjacent fat. By report, the patient has known node positive disease. Bandlike area of volume loss and subtle bronchiectasis in the anterior right lung suggests scarring. 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 -12/10/2023: Mammogram: Significant interval decrease in size of biopsy-proven LEFT breast malignancy and axillary lymph nodes, consistent with treatment response. (From 2.8 x 1.4 x 2.5 cm to 1.4 x 0.5 x 1.4 cm ). Previously seen adjacent smaller mass at 4 o'clock 7 CMFN, which measured 1.3 x 0.9 x 1.2 cm on 07/25/2023, is no longer identified. Significant interval reduction in size of previously seen enlarged LEFT axillary  lymph nodes with the largest remaining LEFT axillary lymph node measuring 0.8 x 0.5 x 0.4 cm. The cortical thickness of this lymph node is normal measuring 0.2 cm.   Current Treatment:  Neoadjuvant Tamoxifen   INTERVAL HISTORY:   Discussed the use of AI scribe software for clinical note transcription with the patient, who gave verbal consent to proceed.  History of Present Illness Tamara King is an 83 year old female with breast cancer who presents for follow-up of her breast cancer.  Patient is accompanied by her sister-in-law today.   She is undergoing treatment for breast cancer with tamoxifen  and has noticed a significant decrease in tumor size. She is taking tamoxifen  without adverse effects such as hot flashes.  She is also taking alendronate  and vitamin D  supplements. Her vitamin D  level remains low despite taking 2000 IU daily. She has a history of a lung nodule and is due for a repeat lung scan in the next three months.  No new symptoms such as pain or changes in appetite. She has chronic shoulder pain from a previous surgery that cannot be further treated.    I have reviewed the past medical history, past surgical history, social history and family history with the patient and they are unchanged from previous note.  ALLERGIES:  is allergic to losartan, sulfa antibiotics, codeine, fish allergy,  other, spironolactone , vancomycin, and penicillins.  MEDICATIONS:  Current Outpatient Medications  Medication Sig Dispense Refill   acetaminophen  (TYLENOL ) 500 MG tablet Take 2 tablets (1,000 mg total) by mouth 3 (three) times daily. 180 tablet PRN   alendronate  (FOSAMAX ) 70 MG tablet TAKE 1 TABLET BY MOUTH ONCE A WEEK WITH  A  FULL  GLASS  OF  WATER  ON  AN  EMPTY  STOMACH 12 tablet 0   amLODipine  (NORVASC ) 5 MG tablet Take 1 tablet (5 mg total) by mouth daily. 90 tablet 1   carvedilol  (COREG ) 25 MG tablet Take 1 tablet (25 mg total) by mouth 2 (two) times daily. 180 tablet 1    cetirizine  (EQ ALLERGY RELIEF, CETIRIZINE ,) 10 MG tablet Take 1 tablet by mouth once daily 90 tablet 1   Cholecalciferol (VITAMIN D3) 25 MCG (1000 UT) CAPS Take 1 capsule by mouth daily.     clopidogrel  (PLAVIX ) 75 MG tablet Take 1 tablet (75 mg total) by mouth daily. 90 tablet 1   ergocalciferol  (VITAMIN D2) 1.25 MG (50000 UT) capsule Take 1 capsule (50,000 Units total) by mouth once a week for 4 days. 4 capsule 0   famotidine  (PEPCID ) 20 MG tablet Take 1 tablet (20 mg total) by mouth daily. 90 tablet 1   furosemide  (LASIX ) 40 MG tablet Take 1 tablet (40 mg total) by mouth 2 (two) times daily. 180 tablet 1   ondansetron  (ZOFRAN ) 4 MG tablet Take 1 tablet (4 mg total) by mouth every 8 (eight) hours as needed for nausea or vomiting. 20 tablet 0   pantoprazole  (PROTONIX ) 40 MG tablet Take 1 tablet by mouth once daily 90 tablet 1   potassium chloride  SA (KLOR-CON  M) 20 MEQ tablet Take 1 tablet (20 mEq total) by mouth 2 (two) times daily. 180 tablet 1   rosuvastatin  (CRESTOR ) 40 MG tablet Take 1 tablet (40 mg total) by mouth daily. 90 tablet 1   tamoxifen  (NOLVADEX ) 20 MG tablet Take 1 tablet (20 mg total) by mouth daily. 90 tablet 3   No current facility-administered medications for this visit.    REVIEW OF SYSTEMS:   All other systems were reviewed with the patient and are negative.   VITALS:  Blood pressure (!) 158/60, pulse 64, temperature (!) 97.5 F (36.4 C), temperature source Oral, resp. rate 16, weight 111 lb 9.6 oz (50.6 kg), SpO2 96%.  Wt Readings from Last 3 Encounters:  12/16/23 111 lb 9.6 oz (50.6 kg)  11/19/23 109 lb (49.4 kg)  10/31/23 106 lb 3.2 oz (48.2 kg)    Body mass index is 20.75 kg/m.  Performance status (ECOG): 2 - Symptomatic, <50% confined to bed  PHYSICAL EXAM:   GENERAL:alert, no distress and comfortable, frail female NECK: supple, thyroid  normal size, non-tender, without nodularity LYMPH:  no palpable lymphadenopathy in the cervical, axillary or  inguinal LUNGS: clear to auscultation and percussion with normal breathing effort HEART: regular rate & rhythm and no murmurs and no lower extremity edema ABDOMEN:abdomen soft, non-tender and normal bowel sounds Musculoskeletal:no cyanosis of digits and no clubbing  NEURO: alert & oriented x 3 with fluent speech  LABORATORY DATA:  I have reviewed the data as listed    Component Value Date/Time   NA 143 12/02/2023 1053   NA 138 07/03/2023 1041   K 4.2 12/02/2023 1053   CL 105 12/02/2023 1053   CO2 27 12/02/2023 1053   GLUCOSE 100 (H) 12/02/2023 1053   BUN 13 12/02/2023 1053   BUN  15 07/03/2023 1041   CREATININE 1.01 (H) 12/02/2023 1053   CALCIUM  9.4 12/02/2023 1053   PROT 7.5 12/02/2023 1053   PROT 6.8 07/03/2023 1041   ALBUMIN 4.6 12/02/2023 1053   ALBUMIN 4.5 07/03/2023 1041   AST 24 12/02/2023 1053   ALT 8 12/02/2023 1053   ALKPHOS 53 12/02/2023 1053   BILITOT 0.5 12/02/2023 1053   BILITOT 0.6 07/03/2023 1041   GFRNONAA 56 (L) 12/02/2023 1053   GFRAA 55 (L) 05/12/2019 1140    Lab Results  Component Value Date   WBC 7.6 12/02/2023   NEUTROABS 4.2 12/02/2023   HGB 13.7 12/02/2023   HCT 42.2 12/02/2023   MCV 93.8 12/02/2023   PLT 219 12/02/2023     RADIOGRAPHIC STUDIES: I have personally reviewed the radiological images as listed and agreed with the findings in the report.  US  LIMITED ULTRASOUND INCLUDING AXILLA LEFT BREAST  CLINICAL DATA:  83 year old woman with history of RIGHT malignant lumpectomy and radiation in 2006 presented with tender palpable LEFT breast mass on 07/25/2023. Subsequent workup and biopsy of LEFT breast mass and axilla demonstrated grade 2 invasive mammary carcinoma at 4 o'clock 7 CMFN and invasive mammary carcinoma in LEFT axilla.  She is currently undergoing treatment with tamoxifen . She presents today for follow-up of known LEFT breast and axillary malignancy.  EXAM: DIGITAL DIAGNOSTIC UNILATERAL LEFT MAMMOGRAM WITH TOMOSYNTHESIS  AND CAD; ULTRASOUND LEFT BREAST LIMITED  TECHNIQUE: Left digital diagnostic mammography and breast tomosynthesis was performed. The images were evaluated with computer-aided detection. ; Targeted ultrasound examination of the left breast was performed.  COMPARISON:  Previous exam(s).  ACR Breast Density Category c: The breasts are heterogeneously dense, which may obscure small masses.  FINDINGS: LEFT:  Mammogram: Previously seen mass in the far posterior depth of the outer LEFT breast has significantly decreased in size. No new suspicious mass, distortion, or microcalcifications are identified to suggest presence of malignancy.  Ultrasound: Targeted sonographic evaluation of the LEFT breast was performed.  1.4 x 0.5 x 1.4 cm irregularly-shaped hypoechoic mass again seen at 4 o'clock 7 CMFN has significantly decreased in size since the prior examination from 07/25/2023, when it measured 2.8 x 1.4 x 2.5 cm.  Previously seen adjacent smaller mass at 4 o'clock 7 CMFN, which measured 1.3 x 0.9 x 1.2 cm on 07/25/2023, is no longer identified.  Significant interval reduction in size of previously seen enlarged LEFT axillary lymph nodes with the largest remaining LEFT axillary lymph node measuring 0.8 x 0.5 x 0.4 cm. The cortical thickness of this lymph node is normal measuring 0.2 cm.  IMPRESSION: Significant interval decrease in size of biopsy-proven LEFT breast malignancy and axillary lymph nodes, consistent with treatment response.  RECOMMENDATION: Per treatment plan. Patient is due for RIGHT annual mammogram in July 2026.  I have discussed the findings and recommendations with the patient. If applicable, a reminder letter will be sent to the patient regarding the next appointment.  BI-RADS CATEGORY  6: Known biopsy-proven malignancy.  Electronically Signed   By: Aliene Lloyd M.D.   On: 12/10/2023 11:58 MM DIAG BREAST TOMO UNI LEFT CLINICAL DATA:  83 year old woman  with history of RIGHT malignant lumpectomy and radiation in 2006 presented with tender palpable LEFT breast mass on 07/25/2023. Subsequent workup and biopsy of LEFT breast mass and axilla demonstrated grade 2 invasive mammary carcinoma at 4 o'clock 7 CMFN and invasive mammary carcinoma in LEFT axilla.  She is currently undergoing treatment with tamoxifen . She presents today for follow-up of  known LEFT breast and axillary malignancy.  EXAM: DIGITAL DIAGNOSTIC UNILATERAL LEFT MAMMOGRAM WITH TOMOSYNTHESIS AND CAD; ULTRASOUND LEFT BREAST LIMITED  TECHNIQUE: Left digital diagnostic mammography and breast tomosynthesis was performed. The images were evaluated with computer-aided detection. ; Targeted ultrasound examination of the left breast was performed.  COMPARISON:  Previous exam(s).  ACR Breast Density Category c: The breasts are heterogeneously dense, which may obscure small masses.  FINDINGS: LEFT:  Mammogram: Previously seen mass in the far posterior depth of the outer LEFT breast has significantly decreased in size. No new suspicious mass, distortion, or microcalcifications are identified to suggest presence of malignancy.  Ultrasound: Targeted sonographic evaluation of the LEFT breast was performed.  1.4 x 0.5 x 1.4 cm irregularly-shaped hypoechoic mass again seen at 4 o'clock 7 CMFN has significantly decreased in size since the prior examination from 07/25/2023, when it measured 2.8 x 1.4 x 2.5 cm.  Previously seen adjacent smaller mass at 4 o'clock 7 CMFN, which measured 1.3 x 0.9 x 1.2 cm on 07/25/2023, is no longer identified.  Significant interval reduction in size of previously seen enlarged LEFT axillary lymph nodes with the largest remaining LEFT axillary lymph node measuring 0.8 x 0.5 x 0.4 cm. The cortical thickness of this lymph node is normal measuring 0.2 cm.  IMPRESSION: Significant interval decrease in size of biopsy-proven LEFT breast malignancy  and axillary lymph nodes, consistent with treatment response.  RECOMMENDATION: Per treatment plan. Patient is due for RIGHT annual mammogram in July 2026.  I have discussed the findings and recommendations with the patient. If applicable, a reminder letter will be sent to the patient regarding the next appointment.  BI-RADS CATEGORY  6: Known biopsy-proven malignancy.  Electronically Signed   By: Aliene Lloyd M.D.   On: 12/10/2023 11:58

## 2023-12-16 ENCOUNTER — Inpatient Hospital Stay: Admitting: Oncology

## 2023-12-16 VITALS — BP 158/60 | HR 64 | Temp 97.5°F | Resp 16 | Wt 111.6 lb

## 2023-12-16 DIAGNOSIS — R911 Solitary pulmonary nodule: Secondary | ICD-10-CM | POA: Diagnosis not present

## 2023-12-16 DIAGNOSIS — C50512 Malignant neoplasm of lower-outer quadrant of left female breast: Secondary | ICD-10-CM | POA: Diagnosis not present

## 2023-12-16 DIAGNOSIS — Z170 Estrogen receptor positive status [ER+]: Secondary | ICD-10-CM

## 2023-12-16 DIAGNOSIS — E559 Vitamin D deficiency, unspecified: Secondary | ICD-10-CM | POA: Diagnosis not present

## 2023-12-16 DIAGNOSIS — M810 Age-related osteoporosis without current pathological fracture: Secondary | ICD-10-CM

## 2023-12-16 MED ORDER — ERGOCALCIFEROL 1.25 MG (50000 UT) PO CAPS: 50000.0000 [IU] | ORAL_CAPSULE | ORAL | 0 refills | Status: AC

## 2023-12-24 ENCOUNTER — Other Ambulatory Visit (HOSPITAL_COMMUNITY): Payer: Self-pay | Admitting: General Surgery

## 2023-12-24 ENCOUNTER — Encounter: Payer: Self-pay | Admitting: General Surgery

## 2023-12-24 ENCOUNTER — Ambulatory Visit: Admitting: General Surgery

## 2023-12-24 ENCOUNTER — Other Ambulatory Visit: Payer: Self-pay

## 2023-12-24 VITALS — BP 142/81 | HR 62 | Temp 97.6°F | Resp 14 | Ht 61.5 in | Wt 110.0 lb

## 2023-12-24 DIAGNOSIS — C50512 Malignant neoplasm of lower-outer quadrant of left female breast: Secondary | ICD-10-CM

## 2023-12-24 DIAGNOSIS — R928 Other abnormal and inconclusive findings on diagnostic imaging of breast: Secondary | ICD-10-CM

## 2023-12-24 DIAGNOSIS — Z170 Estrogen receptor positive status [ER+]: Secondary | ICD-10-CM

## 2023-12-24 NOTE — H&P (Signed)
 [facility] History and Physical  Reason for Referral: Left breast cancer with positive lymph node, s/p Tamoxifen  treatment   Chief Complaint   Follow-up     Tamara King is a 83 y.o. female.  HPI:   [name] is a 83 yo who has been getting tamoxifen  treatment for her left breast cancer and has response in the node and the left breast with the areas shrinking. She was seen by Dr. Davonna and is here to discuss the option of surgery.  The patient is here with her family. She had told Dr. Davonna she wanted a cure with surgery. She can no longer feel any mass. She still has tenderness on the left breast laterally.   In the fall we had her risk stratified for surgery and she was able to hold her plavix . She has no new cardiac symptoms.   Past Medical History:  Diagnosis Date   Breast cancer (HCC) 2006   right Adenocarcinoma   Chronic edema    CKD (chronic kidney disease) stage 3, GFR 30-59 ml/min (HCC) 10/22/2018   Coronary atherosclerosis of native coronary artery    Nonobstructive at catheterization 2003, LVEF normal    Essential hypertension    GERD (gastroesophageal reflux disease) 07/06/2013   Idiopathic peripheral neuropathy 10/06/2013   Mixed hyperlipidemia    Occlusion and stenosis of left carotid artery 10/22/2017   Personal history of radiation therapy 2006   Renal artery stenosis    BMS bilateally 2003, PTCA bilaterally 2005 with restenosis   Sleep apnea    Stop Bang score of 4   Stroke (HCC)    Symptomatic varicose veins, right 10/16/2018    Past Surgical History:  Procedure Laterality Date   APPENDECTOMY     BREAST BIOPSY Left 08/05/2023   US  LT BREAST BX W LOC DEV 1ST LESION IMG BX SPEC US  GUIDE 08/05/2023 [facility]   BREAST BIOPSY Left 08/05/2023   US  LT BREAST BX W LOC DEV EA ADD LESION IMG BX SPEC US  GUIDE 08/05/2023 [facility]   BREAST BIOPSY Left 08/05/2023   MM LT BREAST BX W LOC DEV 1ST LESION IMAGE BX SPEC STEREO  GUIDE 08/05/2023 [facility]   BREAST SURGERY Left    ?what type per pt   CATARACT EXTRACTION W/PHACO  03/29/2011   Procedure: CATARACT EXTRACTION PHACO AND INTRAOCULAR LENS PLACEMENT (IOC);  Surgeon: Cherene Mania, MD;  Location: [facility];  Service: Ophthalmology;  Laterality: Right;  CDE:12.82   CATARACT EXTRACTION W/PHACO  04/16/2011   Procedure: CATARACT EXTRACTION PHACO AND INTRAOCULAR LENS PLACEMENT (IOC);  Surgeon: Cherene Mania, MD;  Location: [facility];  Service: Ophthalmology;  Laterality: Left;  CDE: 11.54   CESAREAN SECTION     x2   CHOLECYSTECTOMY  2018   HEMORRHOID SURGERY     RENAL ANGIOGRAPHY N/A 05/20/2019   Procedure: RENAL ANGIOGRAPHY;  Surgeon: Darron Deatrice LABOR, MD;  Location: [facility];  Service: Cardiovascular;  Laterality: N/A;   RENAL INTERVENTION  05/20/2019   Procedure: RENAL INTERVENTION;  Surgeon: Darron Deatrice LABOR, MD;  Location: [facility];  Service: Cardiovascular;;   Right breast lumpectomy Right 2006   ROTATOR CUFF REPAIR  2008   UPPER EXTREMITY ANGIOGRAPHY N/A 11/27/2017   Procedure: UPPER EXTREMITY ANGIOGRAPHY;  Surgeon: Gretta Lonni PARAS, MD;  Location: [facility];  Service: Cardiovascular;  Laterality: N/A;    Family History  Problem Relation Age of Onset   Heart failure Mother    Stroke  Mother 81   Lung cancer Father    Diabetes Father    Diabetes Sister    Brain cancer Sister    COPD Sister    Breast cancer Paternal Aunt    Brain cancer Brother    Diabetes Brother    Diabetes Brother    Diabetes Son    Thyroid  cancer Son    Cancer Son    Diabetes Son    Anesthesia problems Neg Hx    Hypotension Neg Hx    Malignant hyperthermia Neg Hx    Pseudochol deficiency Neg Hx     Social History   Tobacco Use   Smoking status: Former    Current packs/day: 0.00    Average packs/day: 1 pack/day for 35.0 years (35.0 ttl pk-yrs)    Types: Cigarettes    Start date: 01/23/1963    Quit date: 01/22/1998    Years since  quitting: 25.9   Smokeless tobacco: Never   Tobacco comments:    tobacco use - no  Vaping Use   Vaping status: Never Used  Substance Use Topics   Alcohol use: No   Drug use: No    Medications: I have reviewed the patient's current medications. Allergies as of 12/24/2023       Reactions   Losartan Anaphylaxis   Sulfa Antibiotics Anaphylaxis, Swelling   Codeine Nausea And Vomiting   Fish Allergy Other (See Comments)   sick as a dog   Other Other (See Comments)   Bologna - vomiting   Spironolactone  Other (See Comments)   Acute kidney injury   Vancomycin Other (See Comments)   drives me crazy   Penicillins Rash        Medication List        Accurate as of December 24, 2023  1:29 PM. If you have any questions, ask your nurse or doctor.          acetaminophen  500 MG tablet Commonly known as: TYLENOL  Take 2 tablets (1,000 mg total) by mouth 3 (three) times daily.   alendronate  70 MG tablet Commonly known as: FOSAMAX  TAKE 1 TABLET BY MOUTH ONCE A WEEK WITH  A  FULL  GLASS  OF  WATER  ON  AN  EMPTY  STOMACH   amLODipine  5 MG tablet Commonly known as: NORVASC  Take 1 tablet (5 mg total) by mouth daily.   carvedilol  25 MG tablet Commonly known as: COREG  Take 1 tablet (25 mg total) by mouth 2 (two) times daily.   clopidogrel  75 MG tablet Commonly known as: PLAVIX  Take 1 tablet (75 mg total) by mouth daily.   EQ Allergy Relief (Cetirizine ) 10 MG tablet Generic drug: cetirizine  Take 1 tablet by mouth once daily   ergocalciferol  1.25 MG (50000 UT) capsule Commonly known as: VITAMIN D2 Take 50,000 Units by mouth once a week.   famotidine  20 MG tablet Commonly known as: PEPCID  Take 1 tablet (20 mg total) by mouth daily.   furosemide  40 MG tablet Commonly known as: LASIX  Take 1 tablet (40 mg total) by mouth 2 (two) times daily.   ondansetron  4 MG tablet Commonly known as: Zofran  Take 1 tablet (4 mg total) by mouth every 8 (eight) hours as needed for  nausea or vomiting.   pantoprazole  40 MG tablet Commonly known as: PROTONIX  Take 1 tablet by mouth once daily   potassium chloride  SA 20 MEQ tablet Commonly known as: KLOR-CON  M Take 1 tablet (20 mEq total) by mouth 2 (two) times daily.  rosuvastatin  40 MG tablet Commonly known as: CRESTOR  Take 1 tablet (40 mg total) by mouth daily.   tamoxifen  20 MG tablet Commonly known as: NOLVADEX  Take 1 tablet (20 mg total) by mouth daily.   Vitamin D3 25 MCG (1000 UT) Caps Take 1 capsule by mouth daily.         ROS:  A comprehensive review of systems was negative except for: Integument/breast: positive for left breast tender, no palpable mass  Blood pressure (!) 142/81, pulse 62, temperature 97.6 F (36.4 C), temperature source Oral, resp. rate 14, height 5' 1.5 (1.562 m), weight 110 lb (49.9 kg). Physical Exam Vitals reviewed.  HENT:     Head: Normocephalic.     Nose: Nose normal.  Eyes:     Extraocular Movements: Extraocular movements intact.  Cardiovascular:     Rate and Rhythm: Normal rate and regular rhythm.  Pulmonary:     Effort: Pulmonary effort is normal.     Breath sounds: Normal breath sounds.  Chest:  Breasts:    Left: No mass.     Comments: No obvious masses noted in the left lateral breast or in the axilla, tender along that lateral edge  Abdominal:     General: There is no distension.     Palpations: Abdomen is soft.     Tenderness: There is no abdominal tenderness.  Musculoskeletal:        General: Normal range of motion.  Lymphadenopathy:     Upper Body:     Left upper body: No supraclavicular or axillary adenopathy.  Skin:    General: Skin is warm.  Neurological:     General: No focal deficit present.     Mental Status: She is alert and oriented to person, place, and time.  Psychiatric:        Mood and Affect: Mood normal.        Behavior: Behavior normal.        Thought Content: Thought content normal.     Results: US  LIMITED ULTRASOUND  INCLUDING AXILLA LEFT BREAST  CLINICAL DATA:  83 year old woman with history of RIGHT malignant lumpectomy and radiation in 2006 presented with tender palpable LEFT breast mass on 07/25/2023. Subsequent workup and biopsy of LEFT breast mass and axilla demonstrated grade 2 invasive mammary carcinoma at 4 o'clock 7 CMFN and invasive mammary carcinoma in LEFT axilla.  She is currently undergoing treatment with tamoxifen . She presents today for follow-up of known LEFT breast and axillary malignancy.  EXAM: DIGITAL DIAGNOSTIC UNILATERAL LEFT MAMMOGRAM WITH TOMOSYNTHESIS AND CAD; ULTRASOUND LEFT BREAST LIMITED  TECHNIQUE: Left digital diagnostic mammography and breast tomosynthesis was performed. The images were evaluated with computer-aided detection. ; Targeted ultrasound examination of the left breast was performed.  COMPARISON:  Previous exam(s).  ACR Breast Density Category c: The breasts are heterogeneously dense, which may obscure small masses.  FINDINGS: LEFT:  Mammogram: Previously seen mass in the far posterior depth of the outer LEFT breast has significantly decreased in size. No new suspicious mass, distortion, or microcalcifications are identified to suggest presence of malignancy.  Ultrasound: Targeted sonographic evaluation of the LEFT breast was performed.  1.4 x 0.5 x 1.4 cm irregularly-shaped hypoechoic mass again seen at 4 o'clock 7 CMFN has significantly decreased in size since the prior examination from 07/25/2023, when it measured 2.8 x 1.4 x 2.5 cm.  Previously seen adjacent smaller mass at 4 o'clock 7 CMFN, which measured 1.3 x 0.9 x 1.2 cm on 07/25/2023, is no longer identified.  Significant interval reduction in size of previously seen enlarged LEFT axillary lymph nodes with the largest remaining LEFT axillary lymph node measuring 0.8 x 0.5 x 0.4 cm. The cortical thickness of this lymph node is normal measuring 0.2 cm.  IMPRESSION: Significant  interval decrease in size of biopsy-proven LEFT breast malignancy and axillary lymph nodes, consistent with treatment response.  RECOMMENDATION: Per treatment plan. Patient is due for RIGHT annual mammogram in July 2026.  I have discussed the findings and recommendations with the patient. If applicable, a reminder letter will be sent to the patient regarding the next appointment.  BI-RADS CATEGORY  6: Known biopsy-proven malignancy.  Electronically Signed   By: Aliene Lloyd M.D.   On: 12/10/2023 11:58 MM DIAG BREAST TOMO UNI LEFT CLINICAL DATA:  83 year old woman with history of RIGHT malignant lumpectomy and radiation in 2006 presented with tender palpable LEFT breast mass on 07/25/2023. Subsequent workup and biopsy of LEFT breast mass and axilla demonstrated grade 2 invasive mammary carcinoma at 4 o'clock 7 CMFN and invasive mammary carcinoma in LEFT axilla.  She is currently undergoing treatment with tamoxifen . She presents today for follow-up of known LEFT breast and axillary malignancy.  EXAM: DIGITAL DIAGNOSTIC UNILATERAL LEFT MAMMOGRAM WITH TOMOSYNTHESIS AND CAD; ULTRASOUND LEFT BREAST LIMITED  TECHNIQUE: Left digital diagnostic mammography and breast tomosynthesis was performed. The images were evaluated with computer-aided detection. ; Targeted ultrasound examination of the left breast was performed.  COMPARISON:  Previous exam(s).  ACR Breast Density Category c: The breasts are heterogeneously dense, which may obscure small masses.  FINDINGS: LEFT:  Mammogram: Previously seen mass in the far posterior depth of the outer LEFT breast has significantly decreased in size. No new suspicious mass, distortion, or microcalcifications are identified to suggest presence of malignancy.  Ultrasound: Targeted sonographic evaluation of the LEFT breast was performed.  1.4 x 0.5 x 1.4 cm irregularly-shaped hypoechoic mass again seen at 4 o'clock 7 CMFN has  significantly decreased in size since the prior examination from 07/25/2023, when it measured 2.8 x 1.4 x 2.5 cm.  Previously seen adjacent smaller mass at 4 o'clock 7 CMFN, which measured 1.3 x 0.9 x 1.2 cm on 07/25/2023, is no longer identified.  Significant interval reduction in size of previously seen enlarged LEFT axillary lymph nodes with the largest remaining LEFT axillary lymph node measuring 0.8 x 0.5 x 0.4 cm. The cortical thickness of this lymph node is normal measuring 0.2 cm.  IMPRESSION: Significant interval decrease in size of biopsy-proven LEFT breast malignancy and axillary lymph nodes, consistent with treatment response.  RECOMMENDATION: Per treatment plan. Patient is due for RIGHT annual mammogram in July 2026.  I have discussed the findings and recommendations with the patient. If applicable, a reminder letter will be sent to the patient regarding the next appointment.  BI-RADS CATEGORY  6: Known biopsy-proven malignancy.  Electronically Signed   By: Aliene Lloyd M.D.   On: 12/10/2023 11:58     Assessment and Plan:  Tamara King is a 83 y.o. female with a left breast cancer that has lymph nodes positive on her biopsy. She has been responsive to tamoxifen  given over the last three months. Discussed that surgery will not cure her as she had cancer in her lymph nodes, but that it is reassuring that she is responding to treatment. Discussed that I can excise the breast mass and sample the lymph nodes but that she still could have cancer cells in her lymphatics or blood stream since the cancer  was present in the lymph nodes. Discussed options of no surgery and monitoring/ medical treatment versus surgery. Discussed now that this is smaller and not adherent to the muscle that we can proceed with lumpectomy and sentinel node after localization versus mastectomy and sentinel node. Discussed if we do localization of the breast mass that I would see if they can  localize the lymph node biopsy clip too, so that I can try to get that lymph node that we knew was positive. Discussed that I would still give Magtrace. Discussed risk of bleeding, infection, positive margin, needing more treatment/ surgery and risk of lymph edema. Discussed that sentinel node would just tell us  how the tissue responded to the treatment but is not to remove the cancer. It is more to tell us  about the prognosis.  Discussed that her tissue could all come back clear of cancer on the pathology. Discussed post operative course. Discussed that she would like to stay overnight for pain control and monitoring.   She knows to hold her plavix  prior to surgery.    Future Appointments  Date Time Provider Department Center  12/31/2023  9:00 AM [facility]/VASC US  3 [facility]  [place] [facility]  12/31/2023  9:20 AM AP-MM DIAG [facility]MM [facility]  01/02/2024 10:05 AM Severa Rock HERO, FNP [facility] [address]  01/03/2024 11:30 AM [facility]-DOIBP PAT 2 [facility]DO[facility]P None  01/14/2024  9:45 AM Kallie Manuelita BROCKS, MD [facility] None  02/11/2024  9:20 AM Debera Jayson MATSU, MD [facility]  03/17/2024  9:00 AM [facility]A LAB [facility] None  03/17/2024 10:00 AM AP-CT 1 [facility]CT [place] [facility]  03/24/2024  2:40 PM [name], MD [facility] None  05/12/2024  1:00 PM [facility] US  1 [facility] None  07/01/2024 10:00 AM [facility]-ANNUAL WELLNESS VISIT [facility] [address]     All questions were answered to the satisfaction of the patient and family.   Manuelita BROCKS Kallie 12/24/2023, 1:29 PM

## 2023-12-24 NOTE — Patient Instructions (Addendum)
 You will need to get localization tags placed in the breast and axilla if the radiologist are able to do it to.   After this then you will get surgery to remove the breast tissue around the tag and the lymph nodes in the area that were biopsied.  You will also get dye injected to help me find the lymph node.   You will need to hold the plavix  for 5 days before surgery. You can stay overnight after surgery if you want.

## 2023-12-24 NOTE — Progress Notes (Signed)
 [facility] History and Physical  Reason for Referral: Left breast cancer with positive lymph node, s/p Tamoxifen  treatment   Chief Complaint   Follow-up     Tamara King is a 83 y.o. female.  HPI:   [name] is a 83 yo who has been getting tamoxifen  treatment for her left breast cancer and has response in the node and the left breast with the areas shrinking. She was seen by Dr. Davonna and is here to discuss the option of surgery.  The patient is here with her family. She had told Dr. Davonna she wanted a cure with surgery. She can no longer feel any mass. She still has tenderness on the left breast laterally.   In the fall we had her risk stratified for surgery and she was able to hold her plavix . She has no new cardiac symptoms.   Past Medical History:  Diagnosis Date   Breast cancer (HCC) 2006   right Adenocarcinoma   Chronic edema    CKD (chronic kidney disease) stage 3, GFR 30-59 ml/min (HCC) 10/22/2018   Coronary atherosclerosis of native coronary artery    Nonobstructive at catheterization 2003, LVEF normal    Essential hypertension    GERD (gastroesophageal reflux disease) 07/06/2013   Idiopathic peripheral neuropathy 10/06/2013   Mixed hyperlipidemia    Occlusion and stenosis of left carotid artery 10/22/2017   Personal history of radiation therapy 2006   Renal artery stenosis    BMS bilateally 2003, PTCA bilaterally 2005 with restenosis   Sleep apnea    Stop Bang score of 4   Stroke (HCC)    Symptomatic varicose veins, right 10/16/2018    Past Surgical History:  Procedure Laterality Date   APPENDECTOMY     BREAST BIOPSY Left 08/05/2023   US  LT BREAST BX W LOC DEV 1ST LESION IMG BX SPEC US  GUIDE 08/05/2023 [facility]   BREAST BIOPSY Left 08/05/2023   US  LT BREAST BX W LOC DEV EA ADD LESION IMG BX SPEC US  GUIDE 08/05/2023 [facility]   BREAST BIOPSY Left 08/05/2023   MM LT BREAST BX W LOC DEV 1ST LESION IMAGE BX SPEC STEREO  GUIDE 08/05/2023 [facility]   BREAST SURGERY Left    ?what type per pt   CATARACT EXTRACTION W/PHACO  03/29/2011   Procedure: CATARACT EXTRACTION PHACO AND INTRAOCULAR LENS PLACEMENT (IOC);  Surgeon: Cherene Mania, MD;  Location: [facility];  Service: Ophthalmology;  Laterality: Right;  CDE:12.82   CATARACT EXTRACTION W/PHACO  04/16/2011   Procedure: CATARACT EXTRACTION PHACO AND INTRAOCULAR LENS PLACEMENT (IOC);  Surgeon: Cherene Mania, MD;  Location: [facility];  Service: Ophthalmology;  Laterality: Left;  CDE: 11.54   CESAREAN SECTION     x2   CHOLECYSTECTOMY  2018   HEMORRHOID SURGERY     RENAL ANGIOGRAPHY N/A 05/20/2019   Procedure: RENAL ANGIOGRAPHY;  Surgeon: Darron Deatrice LABOR, MD;  Location: [facility];  Service: Cardiovascular;  Laterality: N/A;   RENAL INTERVENTION  05/20/2019   Procedure: RENAL INTERVENTION;  Surgeon: Darron Deatrice LABOR, MD;  Location: [facility];  Service: Cardiovascular;;   Right breast lumpectomy Right 2006   ROTATOR CUFF REPAIR  2008   UPPER EXTREMITY ANGIOGRAPHY N/A 11/27/2017   Procedure: UPPER EXTREMITY ANGIOGRAPHY;  Surgeon: Gretta Lonni PARAS, MD;  Location: [facility];  Service: Cardiovascular;  Laterality: N/A;    Family History  Problem Relation Age of Onset   Heart failure Mother    Stroke  Mother 73   Lung cancer Father    Diabetes Father    Diabetes Sister    Brain cancer Sister    COPD Sister    Breast cancer Paternal Aunt    Brain cancer Brother    Diabetes Brother    Diabetes Brother    Diabetes Son    Thyroid  cancer Son    Cancer Son    Diabetes Son    Anesthesia problems Neg Hx    Hypotension Neg Hx    Malignant hyperthermia Neg Hx    Pseudochol deficiency Neg Hx     Social History   Tobacco Use   Smoking status: Former    Current packs/day: 0.00    Average packs/day: 1 pack/day for 35.0 years (35.0 ttl pk-yrs)    Types: Cigarettes    Start date: 01/23/1963    Quit date: 01/22/1998    Years since  quitting: 25.9   Smokeless tobacco: Never   Tobacco comments:    tobacco use - no  Vaping Use   Vaping status: Never Used  Substance Use Topics   Alcohol use: No   Drug use: No    Medications: I have reviewed the patient's current medications. Allergies as of 12/24/2023       Reactions   Losartan Anaphylaxis   Sulfa Antibiotics Anaphylaxis, Swelling   Codeine Nausea And Vomiting   Fish Allergy Other (See Comments)   sick as a dog   Other Other (See Comments)   Bologna - vomiting   Spironolactone  Other (See Comments)   Acute kidney injury   Vancomycin Other (See Comments)   drives me crazy   Penicillins Rash        Medication List        Accurate as of December 24, 2023  1:29 PM. If you have any questions, ask your nurse or doctor.          acetaminophen  500 MG tablet Commonly known as: TYLENOL  Take 2 tablets (1,000 mg total) by mouth 3 (three) times daily.   alendronate  70 MG tablet Commonly known as: FOSAMAX  TAKE 1 TABLET BY MOUTH ONCE A WEEK WITH  A  FULL  GLASS  OF  WATER  ON  AN  EMPTY  STOMACH   amLODipine  5 MG tablet Commonly known as: NORVASC  Take 1 tablet (5 mg total) by mouth daily.   carvedilol  25 MG tablet Commonly known as: COREG  Take 1 tablet (25 mg total) by mouth 2 (two) times daily.   clopidogrel  75 MG tablet Commonly known as: PLAVIX  Take 1 tablet (75 mg total) by mouth daily.   EQ Allergy Relief (Cetirizine ) 10 MG tablet Generic drug: cetirizine  Take 1 tablet by mouth once daily   ergocalciferol  1.25 MG (50000 UT) capsule Commonly known as: VITAMIN D2 Take 50,000 Units by mouth once a week.   famotidine  20 MG tablet Commonly known as: PEPCID  Take 1 tablet (20 mg total) by mouth daily.   furosemide  40 MG tablet Commonly known as: LASIX  Take 1 tablet (40 mg total) by mouth 2 (two) times daily.   ondansetron  4 MG tablet Commonly known as: Zofran  Take 1 tablet (4 mg total) by mouth every 8 (eight) hours as needed for  nausea or vomiting.   pantoprazole  40 MG tablet Commonly known as: PROTONIX  Take 1 tablet by mouth once daily   potassium chloride  SA 20 MEQ tablet Commonly known as: KLOR-CON  M Take 1 tablet (20 mEq total) by mouth 2 (two) times daily.  rosuvastatin  40 MG tablet Commonly known as: CRESTOR  Take 1 tablet (40 mg total) by mouth daily.   tamoxifen  20 MG tablet Commonly known as: NOLVADEX  Take 1 tablet (20 mg total) by mouth daily.   Vitamin D3 25 MCG (1000 UT) Caps Take 1 capsule by mouth daily.         ROS:  A comprehensive review of systems was negative except for: Integument/breast: positive for left breast tender, no palpable mass  Blood pressure (!) 142/81, pulse 62, temperature 97.6 F (36.4 C), temperature source Oral, resp. rate 14, height 5' 1.5 (1.562 m), weight 110 lb (49.9 kg). Physical Exam Vitals reviewed.  HENT:     Head: Normocephalic.     Nose: Nose normal.  Eyes:     Extraocular Movements: Extraocular movements intact.  Cardiovascular:     Rate and Rhythm: Normal rate and regular rhythm.  Pulmonary:     Effort: Pulmonary effort is normal.     Breath sounds: Normal breath sounds.  Chest:  Breasts:    Left: No mass.     Comments: No obvious masses noted in the left lateral breast or in the axilla, tender along that lateral edge  Abdominal:     General: There is no distension.     Palpations: Abdomen is soft.     Tenderness: There is no abdominal tenderness.  Musculoskeletal:        General: Normal range of motion.  Lymphadenopathy:     Upper Body:     Left upper body: No supraclavicular or axillary adenopathy.  Skin:    General: Skin is warm.  Neurological:     General: No focal deficit present.     Mental Status: She is alert and oriented to person, place, and time.  Psychiatric:        Mood and Affect: Mood normal.        Behavior: Behavior normal.        Thought Content: Thought content normal.     Results: US  LIMITED ULTRASOUND  INCLUDING AXILLA LEFT BREAST  CLINICAL DATA:  83 year old woman with history of RIGHT malignant lumpectomy and radiation in 2006 presented with tender palpable LEFT breast mass on 07/25/2023. Subsequent workup and biopsy of LEFT breast mass and axilla demonstrated grade 2 invasive mammary carcinoma at 4 o'clock 7 CMFN and invasive mammary carcinoma in LEFT axilla.  She is currently undergoing treatment with tamoxifen . She presents today for follow-up of known LEFT breast and axillary malignancy.  EXAM: DIGITAL DIAGNOSTIC UNILATERAL LEFT MAMMOGRAM WITH TOMOSYNTHESIS AND CAD; ULTRASOUND LEFT BREAST LIMITED  TECHNIQUE: Left digital diagnostic mammography and breast tomosynthesis was performed. The images were evaluated with computer-aided detection. ; Targeted ultrasound examination of the left breast was performed.  COMPARISON:  Previous exam(s).  ACR Breast Density Category c: The breasts are heterogeneously dense, which may obscure small masses.  FINDINGS: LEFT:  Mammogram: Previously seen mass in the far posterior depth of the outer LEFT breast has significantly decreased in size. No new suspicious mass, distortion, or microcalcifications are identified to suggest presence of malignancy.  Ultrasound: Targeted sonographic evaluation of the LEFT breast was performed.  1.4 x 0.5 x 1.4 cm irregularly-shaped hypoechoic mass again seen at 4 o'clock 7 CMFN has significantly decreased in size since the prior examination from 07/25/2023, when it measured 2.8 x 1.4 x 2.5 cm.  Previously seen adjacent smaller mass at 4 o'clock 7 CMFN, which measured 1.3 x 0.9 x 1.2 cm on 07/25/2023, is no longer identified.  Significant interval reduction in size of previously seen enlarged LEFT axillary lymph nodes with the largest remaining LEFT axillary lymph node measuring 0.8 x 0.5 x 0.4 cm. The cortical thickness of this lymph node is normal measuring 0.2 cm.  IMPRESSION: Significant  interval decrease in size of biopsy-proven LEFT breast malignancy and axillary lymph nodes, consistent with treatment response.  RECOMMENDATION: Per treatment plan. Patient is due for RIGHT annual mammogram in July 2026.  I have discussed the findings and recommendations with the patient. If applicable, a reminder letter will be sent to the patient regarding the next appointment.  BI-RADS CATEGORY  6: Known biopsy-proven malignancy.  Electronically Signed   By: Aliene Lloyd M.D.   On: 12/10/2023 11:58 MM DIAG BREAST TOMO UNI LEFT CLINICAL DATA:  83 year old woman with history of RIGHT malignant lumpectomy and radiation in 2006 presented with tender palpable LEFT breast mass on 07/25/2023. Subsequent workup and biopsy of LEFT breast mass and axilla demonstrated grade 2 invasive mammary carcinoma at 4 o'clock 7 CMFN and invasive mammary carcinoma in LEFT axilla.  She is currently undergoing treatment with tamoxifen . She presents today for follow-up of known LEFT breast and axillary malignancy.  EXAM: DIGITAL DIAGNOSTIC UNILATERAL LEFT MAMMOGRAM WITH TOMOSYNTHESIS AND CAD; ULTRASOUND LEFT BREAST LIMITED  TECHNIQUE: Left digital diagnostic mammography and breast tomosynthesis was performed. The images were evaluated with computer-aided detection. ; Targeted ultrasound examination of the left breast was performed.  COMPARISON:  Previous exam(s).  ACR Breast Density Category c: The breasts are heterogeneously dense, which may obscure small masses.  FINDINGS: LEFT:  Mammogram: Previously seen mass in the far posterior depth of the outer LEFT breast has significantly decreased in size. No new suspicious mass, distortion, or microcalcifications are identified to suggest presence of malignancy.  Ultrasound: Targeted sonographic evaluation of the LEFT breast was performed.  1.4 x 0.5 x 1.4 cm irregularly-shaped hypoechoic mass again seen at 4 o'clock 7 CMFN has  significantly decreased in size since the prior examination from 07/25/2023, when it measured 2.8 x 1.4 x 2.5 cm.  Previously seen adjacent smaller mass at 4 o'clock 7 CMFN, which measured 1.3 x 0.9 x 1.2 cm on 07/25/2023, is no longer identified.  Significant interval reduction in size of previously seen enlarged LEFT axillary lymph nodes with the largest remaining LEFT axillary lymph node measuring 0.8 x 0.5 x 0.4 cm. The cortical thickness of this lymph node is normal measuring 0.2 cm.  IMPRESSION: Significant interval decrease in size of biopsy-proven LEFT breast malignancy and axillary lymph nodes, consistent with treatment response.  RECOMMENDATION: Per treatment plan. Patient is due for RIGHT annual mammogram in July 2026.  I have discussed the findings and recommendations with the patient. If applicable, a reminder letter will be sent to the patient regarding the next appointment.  BI-RADS CATEGORY  6: Known biopsy-proven malignancy.  Electronically Signed   By: Aliene Lloyd M.D.   On: 12/10/2023 11:58     Assessment and Plan:  AKEIRA LAHM is a 83 y.o. female with a left breast cancer that has lymph nodes positive on her biopsy. She has been responsive to tamoxifen  given over the last three months. Discussed that surgery will not cure her as she had cancer in her lymph nodes, but that it is reassuring that she is responding to treatment. Discussed that I can excise the breast mass and sample the lymph nodes but that she still could have cancer cells in her lymphatics or blood stream since the cancer  was present in the lymph nodes. Discussed options of no surgery and monitoring/ medical treatment versus surgery. Discussed now that this is smaller and not adherent to the muscle that we can proceed with lumpectomy and sentinel node after localization versus mastectomy and sentinel node. Discussed if we do localization of the breast mass that I would see if they can  localize the lymph node biopsy clip too, so that I can try to get that lymph node that we knew was positive. Discussed that I would still give Magtrace. Discussed risk of bleeding, infection, positive margin, needing more treatment/ surgery and risk of lymph edema. Discussed that sentinel node would just tell us  how the tissue responded to the treatment but is not to remove the cancer. It is more to tell us  about the prognosis.  Discussed that her tissue could all come back clear of cancer on the pathology. Discussed post operative course. Discussed that she would like to stay overnight for pain control and monitoring.   She knows to hold her plavix  prior to surgery.    Future Appointments  Date Time Provider Department Center  12/31/2023  9:00 AM [facility]/VASC US  3 [facility]  12/31/2023  9:20 AM [facility]-MM DIAG [facility]  01/02/2024 10:05 AM Severa Rock HERO, FNP [facility] [address]  01/03/2024 11:30 AM [facility]-DOIBP PAT 2 [facility] None  01/14/2024  9:45 AM Kallie Manuelita BROCKS, MD [facility] None  02/11/2024  9:20 AM Debera Jayson MATSU, MD [facility]  03/17/2024  9:00 AM [facility] LAB [facility] None  03/17/2024 10:00 AM [facility]-CT 1 [facility]  03/24/2024  2:40 PM [name], MD [facility] None  05/12/2024  1:00 PM [facility] US  1 [facility] None  07/01/2024 10:00 AM [facility]-ANNUAL WELLNESS VISIT [facility] [address]     All questions were answered to the satisfaction of the patient and family.   Manuelita BROCKS Kallie 12/24/2023, 1:29 PM

## 2023-12-31 ENCOUNTER — Ambulatory Visit: Admitting: Cardiology

## 2023-12-31 ENCOUNTER — Ambulatory Visit (HOSPITAL_COMMUNITY): Admission: RE | Admit: 2023-12-31 | Source: Ambulatory Visit

## 2023-12-31 ENCOUNTER — Inpatient Hospital Stay (HOSPITAL_COMMUNITY): Admission: RE | Admit: 2023-12-31

## 2024-01-02 ENCOUNTER — Other Ambulatory Visit: Payer: Self-pay | Admitting: Family Medicine

## 2024-01-02 ENCOUNTER — Ambulatory Visit: Payer: Self-pay | Admitting: Family Medicine

## 2024-01-02 ENCOUNTER — Encounter: Payer: Self-pay | Admitting: Family Medicine

## 2024-01-02 VITALS — BP 128/72 | HR 85 | Temp 96.4°F | Ht 61.5 in | Wt 108.8 lb

## 2024-01-02 DIAGNOSIS — I6522 Occlusion and stenosis of left carotid artery: Secondary | ICD-10-CM

## 2024-01-02 DIAGNOSIS — J302 Other seasonal allergic rhinitis: Secondary | ICD-10-CM

## 2024-01-02 DIAGNOSIS — I83813 Varicose veins of bilateral lower extremities with pain: Secondary | ICD-10-CM

## 2024-01-02 DIAGNOSIS — C50512 Malignant neoplasm of lower-outer quadrant of left female breast: Secondary | ICD-10-CM

## 2024-01-02 DIAGNOSIS — Z8673 Personal history of transient ischemic attack (TIA), and cerebral infarction without residual deficits: Secondary | ICD-10-CM

## 2024-01-02 DIAGNOSIS — L989 Disorder of the skin and subcutaneous tissue, unspecified: Secondary | ICD-10-CM

## 2024-01-02 DIAGNOSIS — I10 Essential (primary) hypertension: Secondary | ICD-10-CM

## 2024-01-02 DIAGNOSIS — K219 Gastro-esophageal reflux disease without esophagitis: Secondary | ICD-10-CM

## 2024-01-02 DIAGNOSIS — I701 Atherosclerosis of renal artery: Secondary | ICD-10-CM

## 2024-01-02 DIAGNOSIS — N1831 Chronic kidney disease, stage 3a: Secondary | ICD-10-CM

## 2024-01-02 DIAGNOSIS — E782 Mixed hyperlipidemia: Secondary | ICD-10-CM

## 2024-01-02 DIAGNOSIS — R600 Localized edema: Secondary | ICD-10-CM

## 2024-01-02 LAB — CMP14+EGFR
ALT: 9 IU/L (ref 0–32)
AST: 20 IU/L (ref 0–40)
Albumin: 4.4 g/dL (ref 3.7–4.7)
Alkaline Phosphatase: 48 IU/L (ref 48–129)
BUN/Creatinine Ratio: 10 — ABNORMAL LOW (ref 12–28)
BUN: 14 mg/dL (ref 8–27)
Bilirubin Total: 0.5 mg/dL (ref 0.0–1.2)
CO2: 23 mmol/L (ref 20–29)
Calcium: 9.5 mg/dL (ref 8.7–10.3)
Chloride: 100 mmol/L (ref 96–106)
Creatinine, Ser: 1.38 mg/dL — ABNORMAL HIGH (ref 0.57–1.00)
Globulin, Total: 2.3 g/dL (ref 1.5–4.5)
Glucose: 159 mg/dL — ABNORMAL HIGH (ref 70–99)
Potassium: 3.8 mmol/L (ref 3.5–5.2)
Sodium: 141 mmol/L (ref 134–144)
Total Protein: 6.7 g/dL (ref 6.0–8.5)
eGFR: 38 mL/min/1.73 — ABNORMAL LOW (ref >59)

## 2024-01-02 MED ORDER — FUROSEMIDE 40 MG PO TABS: 40.0000 mg | ORAL_TABLET | Freq: Two times a day (BID) | ORAL | 1 refills | Status: AC

## 2024-01-02 MED ORDER — POTASSIUM CHLORIDE CRYS ER 20 MEQ PO TBCR: 20.0000 meq | EXTENDED_RELEASE_TABLET | Freq: Two times a day (BID) | ORAL | 1 refills | Status: AC

## 2024-01-02 MED ORDER — CLOPIDOGREL BISULFATE 75 MG PO TABS: 75.0000 mg | ORAL_TABLET | Freq: Every day | ORAL | 1 refills | Status: AC

## 2024-01-02 MED ORDER — AMLODIPINE BESYLATE 5 MG PO TABS: 5.0000 mg | ORAL_TABLET | Freq: Every day | ORAL | 1 refills | Status: AC

## 2024-01-02 MED ORDER — PANTOPRAZOLE SODIUM 40 MG PO TBEC: DELAYED_RELEASE_TABLET | ORAL | 1 refills | Status: AC

## 2024-01-02 MED ORDER — ROSUVASTATIN CALCIUM 40 MG PO TABS: 40.0000 mg | ORAL_TABLET | Freq: Every day | ORAL | 1 refills | Status: AC

## 2024-01-02 MED ORDER — CARVEDILOL 25 MG PO TABS: 25.0000 mg | ORAL_TABLET | Freq: Two times a day (BID) | ORAL | 1 refills | Status: AC

## 2024-01-02 MED ORDER — FAMOTIDINE 20 MG PO TABS: 20.0000 mg | ORAL_TABLET | Freq: Every day | ORAL | 1 refills | Status: AC

## 2024-01-02 NOTE — Progress Notes (Signed)
 Subjective:  Patient ID: Tamara King, female    DOB: 06-24-1940, 83 y.o.   MRN: [id number]  Patient Care Team: Severa Rock HERO, FNP as PCP - General (Family Medicine) Debera Jayson MATSU, MD as PCP - Cardiology (Cardiology) Rachele Gaynell RAMAN, MD as Consulting Physician (Nephrology) Darron Deatrice LABOR, MD as Consulting Physician (Cardiology) Billee Mliss BIRCH, RPH-CPP as [facility] Care Management (Pharmacist) Darron Deatrice LABOR, MD as Consulting Physician (Cardiology) Vicci Mcardle, OD (Optometry) [name], MD as Medical Oncologist (Medical Oncology) Celestia Joesph SQUIBB, RN as Oncology Nurse Navigator (Medical Oncology)   Chief Complaint:  Medical Management of Chronic Issues (6 month follow up ) and Foot Pain (Left foot pain ongoing )   HPI: Tamara King is a 83 y.o. female presenting on 01/02/2024 for Medical Management of Chronic Issues (6 month follow up ) and Foot Pain (Left foot pain ongoing )    Tamara King is an 83 year old female who presents for chronic follow up.   She has painful lesions on both feet, with a notable lesion on her leg that has been present for several months. The lesion is painful and has been there for at least this year. She has not previously seen a podiatrist for this issue. She wants a specialist to evaluate both feet as the other foot is also not in good shape.  Both feet are painful, with a history of a 'little place' that pops up, turns red, and is associated with visible veins. She has not seen a vascular specialist for her varicose veins, which cause swelling and a sensation of numbness in her leg.  She is scheduled for breast surgery on December 22nd, which will involve lymph node removal. She has been on tamoxifen , which has helped reduce the size of a breast lump. She is unsure how long she will continue the medication post-surgery. She has a history of breast cancer and previous lymph node removal.  Her blood  pressure was low this morning. She is currently on carvedilol  and amlodipine  for blood pressure management. No significant side effects from tamoxifen , such as nausea or vomiting, are reported.  Her kidney function was slightly declined in the past, and she is due for a repeat check to monitor this. No changes in urination are noted. She reports eating well and having no issues with her appetite.  No weakness, confusion, or passing out. Reports feeling weak sometimes, but not more than usual.          Relevant past medical, surgical, family, and social history reviewed and updated as indicated.  Allergies and medications reviewed and updated. Data reviewed: Chart in Epic.   Past Medical History:  Diagnosis Date   Breast cancer [facility]) 2006   right Adenocarcinoma   Chronic edema    CKD (chronic kidney disease) stage 3, GFR 30-59 ml/min (HCC) 10/22/2018   Coronary atherosclerosis of native coronary artery    Nonobstructive at catheterization 2003, LVEF normal    Essential hypertension    GERD (gastroesophageal reflux disease) 07/06/2013   Idiopathic peripheral neuropathy 10/06/2013   Mixed hyperlipidemia    Occlusion and stenosis of left carotid artery 10/22/2017   Personal history of radiation therapy 2006   Renal artery stenosis    BMS bilateally 2003, PTCA bilaterally 2005 with restenosis   Sleep apnea    Stop Bang score of 4   Stroke [facility])    Symptomatic varicose veins, right 10/16/2018    Past Surgical  History:  Procedure Laterality Date   APPENDECTOMY     BREAST BIOPSY Left 08/05/2023   US  LT BREAST BX W LOC DEV 1ST LESION IMG BX SPEC US  GUIDE 08/05/2023 [facility]   BREAST BIOPSY Left 08/05/2023   US  LT BREAST BX W LOC DEV EA ADD LESION IMG BX SPEC US  GUIDE 08/05/2023 [facility]   BREAST BIOPSY Left 08/05/2023   MM LT BREAST BX W LOC DEV 1ST LESION IMAGE BX SPEC STEREO GUIDE 08/05/2023 [facility]   BREAST SURGERY Left    ?what type per pt   CATARACT  EXTRACTION W/PHACO  03/29/2011   Procedure: CATARACT EXTRACTION PHACO AND INTRAOCULAR LENS PLACEMENT (IOC);  Surgeon: Cherene Mania, MD;  Location: [facility];  Service: Ophthalmology;  Laterality: Right;  CDE:12.82   CATARACT EXTRACTION W/PHACO  04/16/2011   Procedure: CATARACT EXTRACTION PHACO AND INTRAOCULAR LENS PLACEMENT (IOC);  Surgeon: Cherene Mania, MD;  Location: [facility];  Service: Ophthalmology;  Laterality: Left;  CDE: 11.54   CESAREAN SECTION     x2   CHOLECYSTECTOMY  2018   HEMORRHOID SURGERY     RENAL ANGIOGRAPHY N/A 05/20/2019   Procedure: RENAL ANGIOGRAPHY;  Surgeon: Darron Deatrice LABOR, MD;  Location: [facility];  Service: Cardiovascular;  Laterality: N/A;   RENAL INTERVENTION  05/20/2019   Procedure: RENAL INTERVENTION;  Surgeon: Darron Deatrice LABOR, MD;  Location: [facility];  Service: Cardiovascular;;   Right breast lumpectomy Right 2006   ROTATOR CUFF REPAIR  2008   UPPER EXTREMITY ANGIOGRAPHY N/A 11/27/2017   Procedure: UPPER EXTREMITY ANGIOGRAPHY;  Surgeon: Gretta Lonni PARAS, MD;  Location: [facility];  Service: Cardiovascular;  Laterality: N/A;    Social History   Socioeconomic History   Marital status: Widowed    Spouse name: Not on file   Number of children: 2   Years of education: 12th   Highest education level: 12th grade  Occupational History    Employer: RETIRED    Comment: n/a  Tobacco Use   Smoking status: Former    Current packs/day: 0.00    Average packs/day: 1 pack/day for 35.0 years (35.0 ttl pk-yrs)    Types: Cigarettes    Start date: 01/23/1963    Quit date: 01/22/1998    Years since quitting: 25.9   Smokeless tobacco: Never   Tobacco comments:    tobacco use - no  Vaping Use   Vaping status: Never Used  Substance and Sexual Activity   Alcohol use: No   Drug use: No   Sexual activity: Not Currently    Partners: Male    Comment: widowed  Other Topics Concern   Not on file  Social History Narrative   Patient lives at home  with family.  Two sons and two granddaughters live with her.   Caffeine use; 2-5 cups daily   Oldest son has cancer   Social Drivers of Health   Tobacco Use: Medium Risk (01/02/2024)   Patient History    Smoking Tobacco Use: Former    Smokeless Tobacco Use: Never    Passive Exposure: Not on file  Financial Resource Strain: Low Risk (07/01/2023)   Overall Financial Resource Strain (CARDIA)    Difficulty of Paying Living Expenses: Not hard at all  Food Insecurity: No Food Insecurity (07/01/2023)   Hunger Vital Sign    Worried About Running Out of Food in the Last Year: Never true    Ran Out of Food in the Last Year: Never true  Transportation Needs: No Transportation Needs (07/01/2023)   PRAPARE - Administrator, Civil Service (Medical): No    Lack of Transportation (Non-Medical): No  Physical Activity: Insufficiently Active (06/29/2022)   Exercise Vital Sign    Days of Exercise per Week: 3 days    Minutes of Exercise per Session: 30 min  Stress: No Stress Concern Present (07/01/2023)   [employer] of Occupational Health - Occupational Stress Questionnaire    Feeling of Stress : Only a little  Social Connections: Socially Isolated (07/01/2023)   Social Connection and Isolation Panel    Frequency of Communication with Friends and Family: Twice a week    Frequency of Social Gatherings with Friends and Family: Twice a week    Attends Religious Services: Never    Database Administrator or Organizations: No    Attends Banker Meetings: Never    Marital Status: Widowed  Intimate Partner Violence: Not At Risk (07/01/2023)   Humiliation, Afraid, Rape, and Kick questionnaire    Fear of Current or Ex-Partner: No    Emotionally Abused: No    Physically Abused: No    Sexually Abused: No  Depression (PHQ2-9): Low Risk (12/16/2023)   Depression (PHQ2-9)    PHQ-2 Score: 0  Alcohol Screen: Low Risk (07/01/2023)   Alcohol Screen    Last Alcohol Screening Score (AUDIT): 0   Housing: Unknown (07/01/2023)   Housing Stability Vital Sign    Unable to Pay for Housing in the Last Year: No    Number of Times Moved in the Last Year: Not on file    Homeless in the Last Year: No  Utilities: Not At Risk (07/01/2023)   [facility] Utilities    Threatened with loss of utilities: No  Health Literacy: Adequate Health Literacy (07/01/2023)   B1300 Health Literacy    Frequency of need for help with medical instructions: Never    Outpatient Encounter Medications as of 01/02/2024  Medication Sig   acetaminophen  (TYLENOL ) 500 MG tablet Take 2 tablets (1,000 mg total) by mouth 3 (three) times daily.   alendronate  (FOSAMAX ) 70 MG tablet TAKE 1 TABLET BY MOUTH ONCE A WEEK WITH  A  FULL  GLASS  OF  WATER  ON  AN  EMPTY  STOMACH   cetirizine  (EQ ALLERGY RELIEF, CETIRIZINE ,) 10 MG tablet Take 1 tablet by mouth once daily   Cholecalciferol (VITAMIN D3) 25 MCG (1000 UT) CAPS Take 1 capsule by mouth daily.   ergocalciferol  (VITAMIN D2) 1.25 MG (50000 UT) capsule Take 50,000 Units by mouth once a week.   ondansetron  (ZOFRAN ) 4 MG tablet Take 1 tablet (4 mg total) by mouth every 8 (eight) hours as needed for nausea or vomiting.   tamoxifen  (NOLVADEX ) 20 MG tablet Take 1 tablet (20 mg total) by mouth daily.   amLODipine  (NORVASC ) 5 MG tablet Take 1 tablet (5 mg total) by mouth daily.   carvedilol  (COREG ) 25 MG tablet Take 1 tablet (25 mg total) by mouth 2 (two) times daily.   clopidogrel  (PLAVIX ) 75 MG tablet Take 1 tablet (75 mg total) by mouth daily.   famotidine  (PEPCID ) 20 MG tablet Take 1 tablet (20 mg total) by mouth daily.   furosemide  (LASIX ) 40 MG tablet Take 1 tablet (40 mg total) by mouth 2 (two) times daily.   pantoprazole  (PROTONIX ) 40 MG tablet Take 1 tablet by mouth once daily   potassium chloride  SA (KLOR-CON  M) 20 MEQ tablet Take 1 tablet (20 mEq total) by  mouth 2 (two) times daily.   rosuvastatin  (CRESTOR ) 40 MG tablet Take 1 tablet (40 mg total) by mouth daily.   [DISCONTINUED]  amLODipine  (NORVASC ) 5 MG tablet Take 1 tablet (5 mg total) by mouth daily.   [DISCONTINUED] carvedilol  (COREG ) 25 MG tablet Take 1 tablet (25 mg total) by mouth 2 (two) times daily.   [DISCONTINUED] clopidogrel  (PLAVIX ) 75 MG tablet Take 1 tablet (75 mg total) by mouth daily.   [DISCONTINUED] famotidine  (PEPCID ) 20 MG tablet Take 1 tablet (20 mg total) by mouth daily.   [DISCONTINUED] furosemide  (LASIX ) 40 MG tablet Take 1 tablet (40 mg total) by mouth 2 (two) times daily.   [DISCONTINUED] pantoprazole  (PROTONIX ) 40 MG tablet Take 1 tablet by mouth once daily   [DISCONTINUED] potassium chloride  SA (KLOR-CON  M) 20 MEQ tablet Take 1 tablet (20 mEq total) by mouth 2 (two) times daily.   [DISCONTINUED] rosuvastatin  (CRESTOR ) 40 MG tablet Take 1 tablet (40 mg total) by mouth daily.   No facility-administered encounter medications on file as of 01/02/2024.    Allergies[1]  Pertinent ROS per HPI, otherwise unremarkable      Objective:  BP 128/72 (BP Location: Left Arm)   Pulse 85   Temp (!) 96.4 F (35.8 C)   Ht 5' 1.5 (1.562 m)   Wt 108 lb 12.8 oz (49.4 kg)   SpO2 94%   BMI 20.22 kg/m    Wt Readings from Last 3 Encounters:  01/02/24 108 lb 12.8 oz (49.4 kg)  12/24/23 110 lb (49.9 kg)  12/16/23 111 lb 9.6 oz (50.6 kg)    Physical Exam Vitals and nursing note reviewed.  Constitutional:      Appearance: She is ill-appearing (chronically ill, frail elderly).  HENT:     Head: Normocephalic and atraumatic.     Nose: Nose normal.     Mouth/Throat:     Mouth: Mucous membranes are moist.  Eyes:     Pupils: Pupils are equal, round, and reactive to light.  Neck:     Vascular: Carotid bruit present.  Cardiovascular:     Rate and Rhythm: Normal rate and regular rhythm.     Heart sounds: Normal heart sounds.     Comments: Minimal bilateral lower extremity swelling with significant varicose veins, hemosiderin staining.  Pulmonary:     Effort: Pulmonary effort is normal.     Breath  sounds: Normal breath sounds.  Musculoskeletal:     Right lower leg: Edema present.     Left lower leg: Edema present.       Feet:  Skin:    General: Skin is warm and dry.     Capillary Refill: Capillary refill takes less than 2 seconds.  Neurological:     General: No focal deficit present.     Mental Status: She is alert and oriented to person, place, and time.     Gait: Gait abnormal (using cane).  Psychiatric:        Mood and Affect: Mood normal.        Behavior: Behavior normal.        Thought Content: Thought content normal.        Judgment: Judgment normal.          Results for orders placed or performed in visit on 12/02/23  CBC with Differential   Collection Time: 12/02/23 10:53 AM  Result Value Ref Range   WBC 7.6 4.0 - 10.5 K/uL   RBC 4.50 3.87 - 5.11 MIL/uL   Hemoglobin 13.7  12.0 - 15.0 g/dL   HCT 57.7 63.9 - 53.9 %   MCV 93.8 80.0 - 100.0 fL   MCH 30.4 26.0 - 34.0 pg   MCHC 32.5 30.0 - 36.0 g/dL   RDW 86.6 88.4 - 84.4 %   Platelets 219 150 - 400 K/uL   nRBC 0.0 0.0 - 0.2 %   Neutrophils Relative % 55 %   Neutro Abs 4.2 1.7 - 7.7 K/uL   Lymphocytes Relative 35 %   Lymphs Abs 2.6 0.7 - 4.0 K/uL   Monocytes Relative 7 %   Monocytes Absolute 0.5 0.1 - 1.0 K/uL   Eosinophils Relative 2 %   Eosinophils Absolute 0.2 0.0 - 0.5 K/uL   Basophils Relative 1 %   Basophils Absolute 0.0 0.0 - 0.1 K/uL   Immature Granulocytes 0 %   Abs Immature Granulocytes 0.01 0.00 - 0.07 K/uL  Comprehensive metabolic panel   Collection Time: 12/02/23 10:53 AM  Result Value Ref Range   Sodium 143 135 - 145 mmol/L   Potassium 4.2 3.5 - 5.1 mmol/L   Chloride 105 98 - 111 mmol/L   CO2 27 22 - 32 mmol/L   Glucose, Bld 100 (H) 70 - 99 mg/dL   BUN 13 8 - 23 mg/dL   Creatinine, Ser 8.98 (H) 0.44 - 1.00 mg/dL   Calcium  9.4 8.9 - 10.3 mg/dL   Total Protein 7.5 6.5 - 8.1 g/dL   Albumin 4.6 3.5 - 5.0 g/dL   AST 24 15 - 41 U/L   ALT 8 0 - 44 U/L   Alkaline Phosphatase 53 38 - 126  U/L   Total Bilirubin 0.5 0.0 - 1.2 mg/dL   GFR, Estimated 56 (L) >60 mL/min   Anion gap 11 5 - 15  VITAMIN D  25 Hydroxy (Vit-D Deficiency, Fractures)   Collection Time: 12/02/23 10:53 AM  Result Value Ref Range   Vit D, 25-Hydroxy 29.07 (L) 30 - 100 ng/mL       Pertinent labs & imaging results that were available during my care of the patient were reviewed by me and considered in my medical decision making.  Assessment & Plan:  Burnett was seen today for medical management of chronic issues and foot pain.  Diagnoses and all orders for this visit:  Essential (primary) hypertension -     CMP14+EGFR -     amLODipine  (NORVASC ) 5 MG tablet; Take 1 tablet (5 mg total) by mouth daily. -     carvedilol  (COREG ) 25 MG tablet; Take 1 tablet (25 mg total) by mouth 2 (two) times daily. -     furosemide  (LASIX ) 40 MG tablet; Take 1 tablet (40 mg total) by mouth 2 (two) times daily. -     potassium chloride  SA (KLOR-CON  M) 20 MEQ tablet; Take 1 tablet (20 mEq total) by mouth 2 (two) times daily.  Stage 3a chronic kidney disease (HCC) -     CMP14+EGFR  Occlusion and stenosis of left carotid artery -     CMP14+EGFR -     clopidogrel  (PLAVIX ) 75 MG tablet; Take 1 tablet (75 mg total) by mouth daily. -     rosuvastatin  (CRESTOR ) 40 MG tablet; Take 1 tablet (40 mg total) by mouth daily.  History of CVA (cerebrovascular accident) -     CMP14+EGFR -     clopidogrel  (PLAVIX ) 75 MG tablet; Take 1 tablet (75 mg total) by mouth daily. -     rosuvastatin  (CRESTOR ) 40 MG tablet; Take 1 tablet (40  mg total) by mouth daily.  Gastroesophageal reflux disease without esophagitis -     famotidine  (PEPCID ) 20 MG tablet; Take 1 tablet (20 mg total) by mouth daily. -     pantoprazole  (PROTONIX ) 40 MG tablet; Take 1 tablet by mouth once daily  Renal artery stenosis -     CMP14+EGFR -     clopidogrel  (PLAVIX ) 75 MG tablet; Take 1 tablet (75 mg total) by mouth daily. -     rosuvastatin  (CRESTOR ) 40 MG tablet;  Take 1 tablet (40 mg total) by mouth daily.  Mixed hyperlipidemia -     CMP14+EGFR -     rosuvastatin  (CRESTOR ) 40 MG tablet; Take 1 tablet (40 mg total) by mouth daily.  Varicose veins of both lower extremities with pain -     Ambulatory referral to Vascular Surgery  Foot lesion -     Ambulatory referral to Podiatry  Lower extremity edema -     CMP14+EGFR -     furosemide  (LASIX ) 40 MG tablet; Take 1 tablet (40 mg total) by mouth 2 (two) times daily.  Carcinoma of lower-outer quadrant of left breast in female, estrogen receptor positive (HCC) -     CMP14+EGFR       Foot lesion Painful lesion on the foot, likely a corn with central heel clearing, present for several months. Differential includes plantar wart or corn. - Referred to Dr. Roddie, podiatrist, for evaluation and management. - Advised wearing wide shoes to prevent rubbing against the lesion.  Varicose veins of bilateral lower extremities with pain and chronic venous insufficiency Chronic venous insufficiency with varicose veins causing pain and swelling in both legs. Symptoms include numbness and swelling, worsening with time. No prior vascular evaluation. - Referred to vascular specialist for evaluation and potential treatment of varicose veins. - Recommended wearing sports compression socks for easier application and some compression.  Stage 3a chronic kidney disease Previous decline in kidney function. Monitoring required to assess current status. - Ordered kidney function tests to assess current status.  Essential hypertension Blood pressure initially low, likely due to measurement in arm with stenosis. Rechecked in left arm manually, showing 128/72 mmHg. Currently on carvedilol  and amlodipine . - Monitor blood pressure at home to assess need for medication adjustment.  Occlusion and stenosis of left carotid artery Stenosis in the left arm affecting blood pressure readings. No acute changes reported.  Malignant  neoplasm of breast Scheduled for breast surgery on December 22nd, 2025, including lymph node biopsy. Currently on tamoxifen , which has reduced breast lump size. Post-surgery treatment plan pending pathology results. - Continue tamoxifen  as prescribed. - Ensure pre-surgery instructions are followed, including holding Plavix . - Await pathology results to determine further treatment plan.          Continue all other maintenance medications.  Follow up plan: Return if symptoms worsen or fail to improve.   Continue healthy lifestyle choices, including diet (rich in fruits, vegetables, and lean proteins, and low in salt and simple carbohydrates) and exercise (at least 30 minutes of moderate physical activity daily).   The above assessment and management plan was discussed with the patient. The patient verbalized understanding of and has agreed to the management plan. Patient is aware to call the clinic if they develop any new symptoms or if symptoms persist or worsen. Patient is aware when to return to the clinic for a follow-up visit. Patient educated on when it is appropriate to go to the emergency department.   Rosaline Bruns, FNP-C [facility] [phone]     [1]  Allergies Allergen Reactions   Losartan Anaphylaxis   Sulfa Antibiotics Anaphylaxis and Swelling   Codeine Nausea And Vomiting   Fish Allergy Other (See Comments)    sick as a dog   Other Other (See Comments)    Bologna - vomiting   Spironolactone  Other (See Comments)    Acute kidney injury   Vancomycin Other (See Comments)    drives me crazy   Penicillins Rash

## 2024-01-03 ENCOUNTER — Ambulatory Visit: Payer: Self-pay | Admitting: Family Medicine

## 2024-01-03 ENCOUNTER — Other Ambulatory Visit (HOSPITAL_COMMUNITY)

## 2024-01-03 NOTE — Patient Instructions (Signed)
 Tamara King  01/03/2024     @PREFPERIOPPHARMACY @   Your procedure is scheduled on 01/13/2024.   Report to [facility] at  0600  A.M.   Call this number if you have problems the morning of surgery:  [phone]  If you experience any cold or flu symptoms such as cough, fever, chills, shortness of breath, etc. between now and your scheduled surgery, please notify us  at the above number.   Remember:         Your last dose of plavix  should be on 01/07/2024.    Do not eat after midnight.   You may drink clear liquids until  0330 am on 01/13/2024.      Clear liquids allowed are:                    Water, Carbonated beverages (diabetics please choose diet or no sugar options), Black Coffee Only (No creamer, milk or cream, including half & half and powdered creamer), and Clear Sports drink (No red color; diabetics please choose diet or no sugar options)    Take these medicines the morning of surgery with A SIP OF WATER           amlodipine , carvedilol , famotidine , pantoprazole , zofran (if needed).    Do not wear jewelry, make-up or nail polish, including gel polish,  artificial nails, or any other type of covering on natural nails (fingers and  toes).  Do not wear lotions, powders, or perfumes, or deodorant.  Do not shave 48 hours prior to surgery.  Men may shave face and neck.  Do not bring valuables to the hospital.  [facility] is not responsible for any belongings or valuables.  Contacts, dentures or bridgework may not be worn into surgery.  Leave your suitcase in the car.  After surgery it may be brought to your room.  For patients admitted to the hospital, discharge time will be determined by your treatment team.  Patients discharged the day of surgery will not be allowed to drive home and must have someone with them for 24 hours.    Special instructions:   DO NOT smoke tobacco or vape for 24 hours before your procedure.   Please read over the following  fact sheets that you were given. Surgical Site Infection Prevention, Anesthesia Post-op Instructions, and Care and Recovery After Surgery        Sentinel Lymph Node Biopsy in Breast Cancer Treatment, Care After The following information offers guidance on how to care for yourself after your procedure. Your health care provider may also give you more specific instructions. If you have problems or questions, contact your health care provider. What can I expect after the procedure? After the procedure, it is common to have: Blue urine and darker stool for the next 24 hours. This is caused by the dye that was used during the procedure and is normal. Blue skin at the injection site. This may last for up to 8 weeks. Numbness, tingling, or pain near your incision. Swelling or bruising near your incision. Follow these instructions at home: Activity Avoid activities that take a lot of effort. Return to your normal activities as told by your health care provider. Ask your health care provider what activities are safe for you. Incision care  Follow instructions from your health care provider about how to take care of your incision. Make sure you: Wash your hands with soap and water for at least 20 seconds  before and after you change your bandage (dressing). If soap and water are not available, use hand sanitizer. Change your dressing as told by your health care provider. Leave stitches (sutures), metal clips, skin glue, or adhesive strips in place. These skin closures may need to stay in place for 2 weeks or longer. If adhesive strip edges start to loosen and curl up, you may trim the loose edges. Do not remove adhesive strips completely unless your health care provider tells you to do that. Do not take baths, swim, or use a hot tub until your health care provider approves. Ask your health care provider if you may take showers. You may be able to shower 24 hours after your procedure. Check your biopsy  site every day for signs of infection. Check for: More redness, swelling, or pain. Fluid or blood. Warmth. Pus or a bad smell. General instructions If you were given a surgical bra, wear it for the next 48 hours. You may remove the bra to shower. Take over-the-counter and prescription medicines only as told by your health care provider. You may resume your regular diet. Do not have your blood pressure taken or have blood drawn from the arm on the side of the biopsy until your health care provider says it is okay. You may need to be screened for extra fluid around the lymph nodes and swelling in the breast and arm (lymphedema). Follow instructions from your health care provider about how often you should be checked. Keep all follow-up visits. This is important. Contact a health care provider if you have: Nausea and vomiting. Any of these signs of infection: More redness, swelling, or pain around your biopsy site. Fluid or blood coming from your incision. Warmth coming from your incision area. Pus or a bad smell coming from your incision. Any new bruising. Chills or a fever. Get help right away if you have: Pain that is getting worse and your medicine is not helping. Vomiting that will not stop. Chest pain or trouble breathing. These symptoms may be an emergency. Get help right away. Call 911. Do not wait to see if the symptoms will go away. Do not drive yourself to the hospital. Summary After the procedure, it is common to have blue urine and darker stool for the next 24 hours. This is normal. It is caused by the dye that was used during the procedure. Follow instructions from your health care provider about how to take care of your incision. Do not have your blood pressure taken or have blood drawn from the arm on the side of the biopsy until your health care provider says it is okay. You may need to be screened for extra fluid around the lymph nodes and swelling in the breast and arm  (lymphedema). Follow instructions from your health care provider about how often you should be checked. This information is not intended to replace advice given to you by your health care provider. Make sure you discuss any questions you have with your health care provider. Document Revised: 12/21/2020 Document Reviewed: 12/21/2020 Elsevier Patient Education  2024 [employer]General Anesthesia, Adult, Care After The following information offers guidance on how to care for yourself after your procedure. Your health care provider may also give you more specific instructions. If you have problems or questions, contact your health care provider. What can I expect after the procedure? After the procedure, it is common for people to: Have pain or discomfort at the IV site. Have nausea or vomiting.  Have a sore throat or hoarseness. Have trouble concentrating. Feel cold or chills. Feel weak, sleepy, or tired (fatigue). Have soreness and body aches. These can affect parts of the body that were not involved in surgery. Follow these instructions at home: For the time period you were told by your health care provider:  Rest. Do not participate in activities where you could fall or become injured. Do not drive or use machinery. Do not drink alcohol. Do not take sleeping pills or medicines that cause drowsiness. Do not make important decisions or sign legal documents. Do not take care of children on your own. General instructions Drink enough fluid to keep your urine pale yellow. If you have sleep apnea, surgery and certain medicines can increase your risk for breathing problems. Follow instructions from your health care provider about wearing your sleep device: Anytime you are sleeping, including during daytime naps. While taking prescription pain medicines, sleeping medicines, or medicines that make you drowsy. Return to your normal activities as told by your health care provider. Ask your health  care provider what activities are safe for you. Take over-the-counter and prescription medicines only as told by your health care provider. Do not use any products that contain nicotine or tobacco. These products include cigarettes, chewing tobacco, and vaping devices, such as e-cigarettes. These can delay incision healing after surgery. If you need help quitting, ask your health care provider. Contact a health care provider if: You have nausea or vomiting that does not get better with medicine. You vomit every time you eat or drink. You have pain that does not get better with medicine. You cannot urinate or have bloody urine. You develop a skin rash. You have a fever. Get help right away if: You have trouble breathing. You have chest pain. You vomit blood. These symptoms may be an emergency. Get help right away. Call 911. Do not wait to see if the symptoms will go away. Do not drive yourself to the hospital. Summary After the procedure, it is common to have a sore throat, hoarseness, nausea, vomiting, or to feel weak, sleepy, or fatigue. For the time period you were told by your health care provider, do not drive or use machinery. Get help right away if you have difficulty breathing, have chest pain, or vomit blood. These symptoms may be an emergency. This information is not intended to replace advice given to you by your health care provider. Make sure you discuss any questions you have with your health care provider. Document Revised: 04/07/2021 Document Reviewed: 04/07/2021 Elsevier Patient Education  2024 Else[employer] Inc.How to Use Chlorhexidine at Home in the Shower Chlorhexidine gluconate (CHG) is a germ-killing (antiseptic) wash that's used to clean the skin. It can get rid of the germs that normally live on the skin and can keep them away for about 24 hours. If you're having surgery, you may be told to shower with CHG at home the night before surgery. This can help lower your risk for  infection. To use CHG wash in the shower, follow the steps below. Supplies needed: CHG body wash. Clean washcloth. Clean towel. How to use CHG in the shower Follow these steps unless you're told to use CHG in a different way: Start the shower. Use your normal soap and shampoo to wash your face and hair. Turn off the shower or move out of the shower stream. Pour CHG onto a clean washcloth. Do not use any type of brush or rough sponge. Start at your neck,  washing your body down to your toes. Make sure you: Wash the part of your body where the surgery will be done for at least 1 minute. Do not scrub. Do not use CHG on your head or face unless your health care provider tells you to. If it gets into your ears or eyes, rinse them well with water. Do not wash your genitals with CHG. Wash your back and under your arms. Make sure to wash skin folds. Let the CHG sit on your skin for 1-2 minutes or as long as told. Rinse your entire body in the shower, including all body creases and folds. Turn off the shower. Dry off with a clean towel. Do not put anything on your skin afterward, such as powder, lotion, or perfume. Put on clean clothes or pajamas. If it's the night before surgery, sleep in clean sheets. General tips Use CHG only as told, and follow the instructions on the label. Use the full amount of CHG as told. This is often one bottle. Do not smoke and stay away from flames after using CHG. Your skin may feel sticky after using CHG. This is normal. The sticky feeling will go away as the CHG dries. Do not use CHG: If you have a chlorhexidine allergy or have reacted to chlorhexidine in the past. On open wounds or areas of skin that have broken skin, cuts, or scrapes. On babies younger than 20 months of age. Contact a health care provider if: You have questions about using CHG. Your skin gets irritated or itchy. You have a rash after using CHG. You swallow any CHG. Call your local poison  control center [phone] in the [place] Your eyes itch badly, or they become very red or swollen. Your hearing changes. You have trouble seeing. If you can't reach your provider, go to an urgent care or emergency room. Do not drive yourself. Get help right away if: You have swelling or tingling in your mouth or throat. You make high-pitched whistling sounds when you breathe, most often when you breathe out (wheeze). You have trouble breathing. These symptoms may be an emergency. Call 911 right away. Do not wait to see if the symptoms will go away. Do not drive yourself to the hospital. This information is not intended to replace advice given to you by your health care provider. Make sure you discuss any questions you have with your health care provider. Document Revised: 07/24/2022 Document Reviewed: 07/20/2021 Elsevier Patient Education  2024 [employer].

## 2024-01-07 ENCOUNTER — Inpatient Hospital Stay (HOSPITAL_COMMUNITY): Admission: RE | Admit: 2024-01-07 | Discharge: 2024-01-07 | Attending: General Surgery

## 2024-01-07 ENCOUNTER — Encounter (HOSPITAL_COMMUNITY): Payer: Self-pay

## 2024-01-07 ENCOUNTER — Other Ambulatory Visit (HOSPITAL_COMMUNITY): Payer: Self-pay | Admitting: General Surgery

## 2024-01-07 ENCOUNTER — Other Ambulatory Visit: Payer: Self-pay

## 2024-01-07 ENCOUNTER — Ambulatory Visit (HOSPITAL_COMMUNITY): Admission: RE | Admit: 2024-01-07 | Discharge: 2024-01-07 | Attending: General Surgery

## 2024-01-07 VITALS — BP 140/65 | HR 66 | Resp 18 | Ht 61.5 in | Wt 108.8 lb

## 2024-01-07 DIAGNOSIS — I10 Essential (primary) hypertension: Secondary | ICD-10-CM | POA: Insufficient documentation

## 2024-01-07 DIAGNOSIS — Z01810 Encounter for preprocedural cardiovascular examination: Secondary | ICD-10-CM | POA: Insufficient documentation

## 2024-01-07 DIAGNOSIS — R928 Other abnormal and inconclusive findings on diagnostic imaging of breast: Secondary | ICD-10-CM

## 2024-01-07 HISTORY — DX: Unspecified osteoarthritis, unspecified site: M19.90

## 2024-01-07 HISTORY — PX: BREAST BIOPSY: SHX20

## 2024-01-07 MED ORDER — LIDOCAINE HCL (PF) 2 % IJ SOLN
10.0000 mL | Freq: Once | INTRAMUSCULAR | Status: AC
  Administered 2024-01-07: 09:00:00 10 mL

## 2024-01-07 MED ORDER — LIDOCAINE-EPINEPHRINE (PF) 1 %-1:200000 IJ SOLN
INTRAMUSCULAR | Status: AC
  Filled 2024-01-07: qty 30

## 2024-01-07 MED ORDER — LIDOCAINE HCL (PF) 2 % IJ SOLN
INTRAMUSCULAR | Status: AC
  Filled 2024-01-07: qty 20

## 2024-01-07 MED ORDER — LIDOCAINE-EPINEPHRINE (PF) 1 %-1:200000 IJ SOLN
10.0000 mL | Freq: Once | INTRAMUSCULAR | Status: AC
  Administered 2024-01-07: 09:00:00 10 mL via INTRADERMAL

## 2024-01-07 NOTE — Progress Notes (Signed)
 Patient tolerated left breast and left axilla surgical tag placement well today. Patient verbalized understanding of discharge instructions and ambulatory with cane to Mammo for post pictures with no acute distress noted.

## 2024-01-09 DIAGNOSIS — C50512 Malignant neoplasm of lower-outer quadrant of left female breast: Secondary | ICD-10-CM

## 2024-01-13 ENCOUNTER — Ambulatory Visit (HOSPITAL_COMMUNITY): Admitting: Anesthesiology

## 2024-01-13 ENCOUNTER — Ambulatory Visit (HOSPITAL_COMMUNITY)

## 2024-01-13 ENCOUNTER — Encounter (HOSPITAL_COMMUNITY): Payer: Self-pay | Admitting: General Surgery

## 2024-01-13 ENCOUNTER — Encounter: Admission: RE | Payer: Self-pay

## 2024-01-13 ENCOUNTER — Ambulatory Visit (HOSPITAL_COMMUNITY)
Admission: RE | Admit: 2024-01-13 | Discharge: 2024-01-13 | Disposition: A | Discharge status: Home / Self Care | Attending: General Surgery | Admitting: General Surgery

## 2024-01-13 DIAGNOSIS — Z170 Estrogen receptor positive status [ER+]: Secondary | ICD-10-CM

## 2024-01-13 DIAGNOSIS — C50512 Malignant neoplasm of lower-outer quadrant of left female breast: Secondary | ICD-10-CM

## 2024-01-13 DIAGNOSIS — I129 Hypertensive chronic kidney disease with stage 1 through stage 4 chronic kidney disease, or unspecified chronic kidney disease: Secondary | ICD-10-CM | POA: Insufficient documentation

## 2024-01-13 DIAGNOSIS — N183 Chronic kidney disease, stage 3 unspecified: Secondary | ICD-10-CM | POA: Insufficient documentation

## 2024-01-13 DIAGNOSIS — Z79810 Long term (current) use of selective estrogen receptor modulators (SERMs): Secondary | ICD-10-CM | POA: Diagnosis not present

## 2024-01-13 DIAGNOSIS — I739 Peripheral vascular disease, unspecified: Secondary | ICD-10-CM | POA: Diagnosis not present

## 2024-01-13 DIAGNOSIS — I2510 Atherosclerotic heart disease of native coronary artery without angina pectoris: Secondary | ICD-10-CM | POA: Insufficient documentation

## 2024-01-13 DIAGNOSIS — Z171 Estrogen receptor negative status [ER-]: Secondary | ICD-10-CM | POA: Diagnosis not present

## 2024-01-13 DIAGNOSIS — Z7902 Long term (current) use of antithrombotics/antiplatelets: Secondary | ICD-10-CM | POA: Diagnosis not present

## 2024-01-13 DIAGNOSIS — Z1722 Progesterone receptor negative status: Secondary | ICD-10-CM | POA: Diagnosis not present

## 2024-01-13 DIAGNOSIS — R928 Other abnormal and inconclusive findings on diagnostic imaging of breast: Secondary | ICD-10-CM

## 2024-01-13 DIAGNOSIS — Z923 Personal history of irradiation: Secondary | ICD-10-CM | POA: Insufficient documentation

## 2024-01-13 DIAGNOSIS — Z8673 Personal history of transient ischemic attack (TIA), and cerebral infarction without residual deficits: Secondary | ICD-10-CM | POA: Diagnosis not present

## 2024-01-13 DIAGNOSIS — Z87891 Personal history of nicotine dependence: Secondary | ICD-10-CM | POA: Insufficient documentation

## 2024-01-13 HISTORY — PX: AXILLARY SENTINEL NODE BIOPSY: SHX5738

## 2024-01-13 HISTORY — PX: BREAST LUMPECTOMY WITH RADIO FREQUENCY LOCALIZER: SHX6897

## 2024-01-13 SURGERY — BREAST LUMPECTOMY WITH RADIO FREQUENCY LOCALIZER
Anesthesia: General | Site: Breast | Laterality: Left

## 2024-01-13 MED ORDER — ONDANSETRON HCL 4 MG/2ML IJ SOLN: 4.0000 mg | Freq: Once | INTRAMUSCULAR | Status: DC | PRN

## 2024-01-13 MED ORDER — LIDOCAINE HCL (PF) 1 % IJ SOLN
INTRAMUSCULAR | Status: DC | PRN
  Administered 2024-01-13: 15 mL

## 2024-01-13 MED ORDER — OXYCODONE HCL 5 MG PO TABS: 5.0000 mg | ORAL_TABLET | Freq: Once | ORAL | Status: DC | PRN

## 2024-01-13 MED ORDER — 0.9 % SODIUM CHLORIDE (POUR BTL) OPTIME
TOPICAL | Status: DC | PRN
  Administered 2024-01-13: 1000 mL

## 2024-01-13 MED ORDER — VASOPRESSIN 20 UNIT/ML IV SOLN
INTRAVENOUS | Status: AC
  Filled 2024-01-13: qty 1

## 2024-01-13 MED ORDER — LACTATED RINGERS IV SOLN: INTRAVENOUS | Status: DC

## 2024-01-13 MED ORDER — FENTANYL CITRATE (PF) 50 MCG/ML IJ SOSY: 25.0000 ug | PREFILLED_SYRINGE | INTRAMUSCULAR | Status: DC | PRN

## 2024-01-13 MED ORDER — KETAMINE HCL 50 MG/5ML IJ SOSY
PREFILLED_SYRINGE | INTRAMUSCULAR | Status: AC
  Filled 2024-01-13: qty 5

## 2024-01-13 MED ORDER — LIDOCAINE HCL (PF) 1 % IJ SOLN
INTRAMUSCULAR | Status: AC
  Filled 2024-01-13: qty 30

## 2024-01-13 MED ORDER — VASOPRESSIN 20 UNIT/ML IV SOLN
INTRAVENOUS | Status: DC | PRN
  Administered 2024-01-13 (×6): 2 [IU] via INTRAVENOUS

## 2024-01-13 MED ORDER — BUPIVACAINE HCL (PF) 0.5 % IJ SOLN
INTRAMUSCULAR | Status: DC | PRN
  Administered 2024-01-13: 10 mL

## 2024-01-13 MED ORDER — OXYCODONE HCL 5 MG PO TABS: 5.0000 mg | ORAL_TABLET | ORAL | 0 refills | Status: DC | PRN

## 2024-01-13 MED ORDER — CEFAZOLIN SODIUM-DEXTROSE 2-4 GM/100ML-% IV SOLN
2.0000 g | INTRAVENOUS | Status: AC
  Administered 2024-01-13: 2 g via INTRAVENOUS
  Filled 2024-01-13: qty 100

## 2024-01-13 MED ORDER — KETAMINE HCL 50 MG/5ML IJ SOSY
PREFILLED_SYRINGE | INTRAMUSCULAR | Status: DC | PRN
  Administered 2024-01-13: 20 mg via INTRAVENOUS

## 2024-01-13 MED ORDER — OXYCODONE HCL 5 MG/5ML PO SOLN: 5.0000 mg | Freq: Once | ORAL | Status: DC | PRN

## 2024-01-13 MED ORDER — PROPOFOL 500 MG/50ML IV EMUL
INTRAVENOUS | Status: DC | PRN
  Administered 2024-01-13: 80 mg via INTRAVENOUS

## 2024-01-13 MED ORDER — CHLORHEXIDINE GLUCONATE 0.12 % MT SOLN
15.0000 mL | Freq: Once | OROMUCOSAL | Status: AC
  Administered 2024-01-13: 15 mL via OROMUCOSAL
  Filled 2024-01-13: qty 15

## 2024-01-13 MED ORDER — FENTANYL CITRATE (PF) 100 MCG/2ML IJ SOLN
INTRAMUSCULAR | Status: AC
  Filled 2024-01-13: qty 2

## 2024-01-13 MED ORDER — BUPIVACAINE HCL (PF) 0.5 % IJ SOLN
INTRAMUSCULAR | Status: AC
  Filled 2024-01-13: qty 30

## 2024-01-13 MED ORDER — CHLORHEXIDINE GLUCONATE CLOTH 2 % EX PADS
6.0000 | MEDICATED_PAD | Freq: Once | CUTANEOUS | Status: AC
  Administered 2024-01-13: 6 via TOPICAL

## 2024-01-13 MED ORDER — EPHEDRINE SULFATE (PRESSORS) 25 MG/5ML IV SOSY
PREFILLED_SYRINGE | INTRAVENOUS | Status: DC | PRN
  Administered 2024-01-13: 5 mg via INTRAVENOUS
  Administered 2024-01-13 (×2): 10 mg via INTRAVENOUS

## 2024-01-13 MED ORDER — FENTANYL CITRATE (PF) 100 MCG/2ML IJ SOLN
INTRAMUSCULAR | Status: DC | PRN
  Administered 2024-01-13 (×4): 25 ug via INTRAVENOUS

## 2024-01-13 MED ORDER — MAGTRACE LYMPHATIC TRACER
INTRAMUSCULAR | Status: DC | PRN
  Administered 2024-01-13: 2 mL via INTRAMUSCULAR

## 2024-01-13 MED ADMIN — Chlorhexidine Gluconate Pads 2%: 6 | TOPICAL | NDC 53462070523

## 2024-01-13 SURGICAL SUPPLY — 37 items
BLADE SURG 15 STRL LF DISP TIS (BLADE) ×1 IMPLANT
CHLORAPREP W/TINT 26 (MISCELLANEOUS) ×1 IMPLANT
CLIP APPLIE 9.375 SM OPEN (CLIP) ×1 IMPLANT
CLOTH BEACON ORANGE TIMEOUT ST (SAFETY) ×1 IMPLANT
COVER LIGHT HANDLE (MISCELLANEOUS) IMPLANT
COVER PROBE W GEL 5X96 (DRAPES) ×1 IMPLANT
DERMABOND ADVANCED .7 DNX12 (GAUZE/BANDAGES/DRESSINGS) ×1 IMPLANT
DEVICE DUBIN W/COMP PLATE 8390 (MISCELLANEOUS) ×1 IMPLANT
ELECTRODE REM PT RTRN 9FT ADLT (ELECTROSURGICAL) ×1 IMPLANT
FORCEPS ALLIS DISP 8 (DISPOSABLE) IMPLANT
FORCEPS BABCOCK DISP 6 (DISPOSABLE) IMPLANT
GLOVE BIO SURGEON STRL SZ 6.5 (GLOVE) ×1 IMPLANT
GLOVE BIOGEL PI IND STRL 6.5 (GLOVE) ×1 IMPLANT
GLOVE BIOGEL PI IND STRL 7.0 (GLOVE) ×2 IMPLANT
GOWN STRL REUS W/TWL LRG LVL3 (GOWN DISPOSABLE) ×3 IMPLANT
KIT MARKER MARGIN INK (KITS) ×1 IMPLANT
KIT TURNOVER KIT A (KITS) ×1 IMPLANT
MANIFOLD NEPTUNE II (INSTRUMENTS) ×1 IMPLANT
NDL HYPO 18GX1.5 BLUNT FILL (NEEDLE) ×1 IMPLANT
NDL HYPO 25X1 1.5 SAFETY (NEEDLE) ×2 IMPLANT
NEEDLE HYPO 18GX1.5 BLUNT FILL (NEEDLE) ×1 IMPLANT
NEEDLE HYPO 25X1 1.5 SAFETY (NEEDLE) ×2 IMPLANT
PACK MINOR (CUSTOM PROCEDURE TRAY) ×1 IMPLANT
PAD ARMBOARD POSITIONER FOAM (MISCELLANEOUS) ×2 IMPLANT
POSITIONER HEAD 8X9X4 ADT (SOFTGOODS) ×1 IMPLANT
RETRACTOR HANDHELD DISP 8.5 (DISPOSABLE) IMPLANT
SET BASIN LINEN APH (SET/KITS/TRAYS/PACK) ×1 IMPLANT
SET LOCALIZER 20 PROBE US (MISCELLANEOUS) ×1 IMPLANT
SOLN 0.9% NACL POUR BTL 1000ML (IV SOLUTION) ×1 IMPLANT
SPIKE FLUID TRANSFER (MISCELLANEOUS) ×1 IMPLANT
SUT MNCRL AB 4-0 PS2 18 (SUTURE) ×2 IMPLANT
SUT SILK 2 0 SH (SUTURE) ×1 IMPLANT
SUT VIC AB 3-0 SH 27X BRD (SUTURE) ×2 IMPLANT
SYR 30ML LL (SYRINGE) ×2 IMPLANT
SYR BULB IRRIG 60ML STRL (SYRINGE) ×1 IMPLANT
TESSA Tissue Dissectors IMPLANT
TESSA Tissue Dissectors Mammotome IMPLANT

## 2024-01-13 NOTE — Interval H&P Note (Signed)
 History and Physical Interval Note:  01/13/2024 7:29 AM  Tamara King  has presented today for surgery, with the diagnosis of Carcinoma of lower-outer quadrant of left breast in female, estrogen receptor positive.  The various methods of treatment have been discussed with the patient and family. After consideration of risks, benefits and other options for treatment, the patient has consented to  Procedures: BREAST LUMPECTOMY WITH RADIO FREQUENCY LOCALIZER (Left) BIOPSY, LYMPH NODE, SENTINEL, AXILLARY (Left) as a surgical intervention.  The patient's history has been reviewed, patient examined, no change in status, stable for surgery.  I have reviewed the patient's chart and labs.  Questions were answered to the patient's satisfaction.     Tamara King

## 2024-01-13 NOTE — Progress Notes (Signed)
 Dr. Kendell perks using the right upper extremity for an IV.  Right lumpectomy with 2 lymph nodes removed in 2006.  Patient states she has had no trouble with swelling on that side.

## 2024-01-13 NOTE — Transfer of Care (Signed)
 Immediate Anesthesia Transfer of Care Note  Patient: Tamara King  Procedure(s) Performed: BREAST LUMPECTOMY WITH RADIO FREQUENCY LOCALIZER (Left: Breast) BIOPSY, LYMPH NODE, SENTINEL, AXILLARY (Left: Breast)  Patient Location: PACU  Anesthesia Type:General  Level of Consciousness: awake, alert , and patient cooperative  Airway & Oxygen Therapy: Patient Spontanous Breathing and Patient connected to face mask oxygen  Post-op Assessment: Report given to RN, Post -op Vital signs reviewed and stable, and Patient moving all extremities X 4  Post vital signs: Reviewed and stable  Last Vitals:  Vitals Value Taken Time  BP    Temp    Pulse 77 01/13/24 09:19  Resp 11 01/13/24 09:19  SpO2 99 % 01/13/24 09:19  Vitals shown include unfiled device data.  Last Pain:  Vitals:   01/13/24 0644  PainSc: 0-No pain         Complications: No notable events documented.

## 2024-01-13 NOTE — Anesthesia Preprocedure Evaluation (Signed)
"                                    Anesthesia Evaluation  Patient identified by MRN, date of birth, ID band Patient awake    Reviewed: Allergy & Precautions, H&P , NPO status , Patient's Chart, lab work & pertinent test results, reviewed documented beta blocker date and time   Airway Mallampati: II  TM Distance: >3 FB Neck ROM: full    Dental no notable dental hx.    Pulmonary neg pulmonary ROS, former smoker   Pulmonary exam normal breath sounds clear to auscultation       Cardiovascular Exercise Tolerance: Good hypertension, + CAD and + Peripheral Vascular Disease   Rhythm:regular Rate:Normal     Neuro/Psych  Neuromuscular disease CVA  negative psych ROS   GI/Hepatic Neg liver ROS,GERD  ,,  Endo/Other  negative endocrine ROS    Renal/GU Renal disease  negative genitourinary   Musculoskeletal   Abdominal   Peds  Hematology negative hematology ROS (+)   Anesthesia Other Findings   Reproductive/Obstetrics negative OB ROS                              Anesthesia Physical Anesthesia Plan  ASA: 2  Anesthesia Plan: General and General LMA   Post-op Pain Management:    Induction:   PONV Risk Score and Plan: Ondansetron   Airway Management Planned:   Additional Equipment:   Intra-op Plan:   Post-operative Plan:   Informed Consent: I have reviewed the patients History and Physical, chart, labs and discussed the procedure including the risks, benefits and alternatives for the proposed anesthesia with the patient or authorized representative who has indicated his/her understanding and acceptance.     Dental Advisory Given  Plan Discussed with: CRNA  Anesthesia Plan Comments:         Anesthesia Quick Evaluation  "

## 2024-01-13 NOTE — Anesthesia Procedure Notes (Signed)
 Procedure Name: LMA Insertion Date/Time: 01/13/2024 7:47 AM  Performed by: Cordella Elvie HERO, CRNAPre-anesthesia Checklist: Patient identified, Emergency Drugs available, Suction available, Patient being monitored and Timeout performed Patient Re-evaluated:Patient Re-evaluated prior to induction Oxygen Delivery Method: Circle system utilized Preoxygenation: Pre-oxygenation with 100% oxygen Induction Type: IV induction LMA: LMA inserted LMA Size: 3.0 Number of attempts: 1 Placement Confirmation: positive ETCO2, CO2 detector and breath sounds checked- equal and bilateral Tube secured with: Tape Dental Injury: Teeth and Oropharynx as per pre-operative assessment

## 2024-01-13 NOTE — Op Note (Signed)
 [facility] Operative Note  01/13/2024  Preoperative Diagnosis: Left breast cancer with positive sentinel node   Postoperative Diagnosis: Same   Procedure(s) Performed: Left breast partial mastectomy and left axillary sentinel node biopsy (after radiofrequency localizer for the breast and the lymph node)    Surgeon: Manuelita BROCKS. Kallie, MD   Assistants: No qualified resident was available    Anesthesia: General endotracheal   Anesthesiologist: Kendell Yvonna PARAS, MD    Specimens: Left breast mass (superior paint orange and lateral paint red); left axillary lymph nodes    Estimated Blood Loss: Minimal   Blood Replacement: None    Complications: None   Wound Class: Clean   Operative Indications: Tamara King is a 83 yo who has a left breast cancer with positive node noted on biopsy who received hormone treatment preoperatively to try to shrink the palpable mass and she had good response. We discussed excision and risk of bleeding, infection, needing the localizer, and risk of lymphedema with the sentinel node, positive margin.   Findings: Biopsy clip and radiofrequency localizer noted in the breast specimen and the lymph node specimen on mammogram    Procedure: The patient was taken to the operating room and placed supine. General endotracheal anesthesia was induced. Intravenous antibiotics were administered per protocol.  Magtrace was injected intraductal and the breast was massaged. The left breast and axilla was prepped and draped in the usual sterile fashion.   Using the hologic probe was used to localize the two radiofrequency tags in the left lower outer breast and the axilla. I reviewed the mammogram images of the tags prior to the procedure.  An incision was made in the left lower breast close to the inframammary fold at the point closet to the localizer tag.  Lidocaine  was injected. Flaps were created and the breast tissue around the tag was removed with adequate  margins and the specimen was painted (during the painting the red was painted on the lateral and the orange on the superior and this was noted on the pathology specimen order and on the sticker that goes with the specimen, all other paint colors were done on the correct location). The cavity was made hemostatic. The specimen was sent to mammogram and the mammogram reviewed the biopsy clip and radiofrequency tag in the specimen.    I then used the hologic probe and the Magtrace probe to identify the lymph node in the axilla. The Magtrace probe prior to the incision was [id number]. Lidocaine  was injected. I then made the axillary incision and carried it down to the lymph node, noting the radiofrequency probe was in the same area of tissue as the Magtrace was highlighting.  I noted a palpable node, possibly two and excised this in a fat pad to prevent any radiofrequency tags or biopsy clips from falling out.  The Magtrace invivo was [id number] and ex vivo was [id number].  The radiofrequency tag was in the specimen and the specimen was sent to mammogram and a tag and biopsy clip were noted in the specimen.  I checked for any further lymph nodes and the probe did not locate any further hot spots.  The cavities were irrigated and made hemostatic. They were closed with interrupted 3-0 Vicryl. Marcaine  was injected. The skin of both were closed with 4-0 Monocryl subcuticular and dermabond.   Final inspection revealed acceptable hemostasis. All counts were correct at the end of the case. The patient was awakened from anesthesia and extubated without complication.  The patient  went to the PACU in stable condition.   Manuelita Pander, MD [facility] [address] [place], [place] [address] [phone] (office)

## 2024-01-13 NOTE — Progress Notes (Signed)
 [facility]  Updated family. You can restart your plavix  on 01/15/2024 as long as are not having large amount of bruising. Call the office next week if you have questions about your pathology. Follow up 01/29/23.  Manuelita Pander, MD [facility] [address] [place], [place] [address] [phone] (office)

## 2024-01-13 NOTE — Discharge Instructions (Addendum)
 Discharge instructions after breast surgery:   You can restart your plavix  on 01/15/2024 as long as are not having large amount of bruising.  Call the office next week if you have questions about your pathology.   Common Complaints: Pain and bruising at the incision sites.  Swelling at the incision sites. Stiffness of the arm.   Diet/ Activity: Diet as tolerated.  You may shower but do not take hot showers as this can disrupt the glue. Rest and listen to your body, but do not remain in bed all day.  Walk everyday for at least 15-20 minutes. Deep cough and move around every 1-2 hours in the first few days after surgery.  Do not lift > 10 lbs for the first 2 weeks after surgery. Do not do anything that makes you feel like you are putting unnecessary pull or stretch on the incision sites.  Do move your arm and shoulder (see exercises options below). If you do not move then you can get stiff and hurt more.  Do not pick at the dermabond glue on your incision sites.  This glue film will remain in place for 1-2 weeks and will start to peel off.  Do not place lotions or balms on your incision unless instructed to specifically by Dr. Kallie.   Pain Expectations and Narcotics: -After surgery you will have pain associated with your incisions and this is normal. The pain is muscular and nerve pain, and will get better with time. -You are encouraged and expected to take non narcotic medications like tylenol  and ibuprofen (when able) to treat pain as multiple modalities can aid with pain treatment. -Narcotics are only used when pain is severe or there is breakthrough pain. -You are not expected to have a pain score of 0 after surgery, as we cannot prevent pain. A pain score of 3-4 that allows you to be functional, move, walk, and tolerate some activity is the goal. The pain will continue to improve over the days after surgery and is dependent on your surgery. -Due to [place] law, we are only able to give a  certain amount of pain medication to treat post operative pain, and we only give additional narcotics on a patient by patient basis.  -For most laparoscopic surgery, studies have shown that the majority of patients only need 10-15 narcotic pills, and for open surgeries most patients only need 15-20.   -Having appropriate expectations of pain and knowledge of pain management with non narcotics is important as we do not want anyone to become addicted to narcotic pain medication.  -Using ice packs in the first 48 hours and heating pads after 48 hours, wearing an abdominal binder (when recommended), and using over the counter medications are all ways to help with pain management.   -Simple acts like meditation and mindfulness practices after surgery can also help with pain control and research has proven the benefit of these practices.  Medication: Take tylenol  and ibuprofen as needed for pain control, alternating every 4-6 hours.  Example:  Tylenol  1000mg  @ 6am, 12noon, 6pm, (Do not exceed 4000mg  of tylenol  a day). Ibuprofen 800mg  @ 9am, 3pm, 9pm, 3am (Do not exceed 3600mg  of ibuprofen a day).  Take Roxicodone  for breakthrough pain every 4 hours.  Take Colace for constipation related to narcotic pain medication. If you do not have a bowel movement in 2 days, take Miralax over the counter.  Drink plenty of water to also prevent constipation.   Contact Information: If you  have questions or concerns, please call our office, [phone], Monday- Thursday 8AM-5PM and Friday 8AM-12Noon.  If it is after hours or on the weekend, please call [facility], [phone], and ask to speak to the surgeon on call for Dr. Kallie at [facility].   Exercises After Breast Surgery Do at least a few of the exercises below twice a day. It is ok to start the day after surgery and gradually build up the amount and type of exercises you do. Link to the exercises with pictures  (relaythis.com.au).   Deep Breathing Exercise Deep breathing can help you relax and ease discomfort and tightness around your incision (surgical cut). Its also a very good way to relieve stress during the day.  Sit comfortably in a chair. Take a slow, deep breath through your nose. Let your chest and belly expand. Breathe out slowly through your mouth. Repeat as many times as needed.  Arm and Shoulder Exercises Doing arm and shoulder exercises will help you get back your full range of motion on your affected side (the side where you had your surgery). With full range of motion, youll be able to: Move your arm over your head and out to the side Move your arm behind your neck Move your arm to the middle of your back Do each of the exercises below 5 times a day. Keep doing this until you have a full range of motion again and can use your arm as you did before surgery in all your normal activities. This includes activities at work, at home, and in recreation or sports. If you had limited movement in your arm before surgery, your goal will be to get back as much movement as you had before.  If you get your full range of motion back quickly, keep doing these exercises once a day instead of 5 times a day. This is especially true if you feel any tightness in your chest, shoulder, or under your affected arm. These exercises can help keep scar tissue from forming in your armpit and shoulder. Scar tissue can limit your arm movements later.  If you still have trouble moving your shoulder 4 weeks after your surgery, tell your surgeon. Theyll tell you if you need more rehabilitation, such as physical or occupational therapy.  If you had one of the following surgeries, you can do the following set of exercises on the first day after your surgery, as long as your surgeon tells you its safe.  Shoulder rolls The shoulder roll is a good  exercise to start with because it gently stretches your chest and shoulder muscles.  Stand or sit comfortably with your arms relaxed at your sides. Start with backward shoulder rolls. In a circular motion, bring your shoulders forward, up, backward, and down. Do this 10 times. Switch directions and do 10 forward shoulder rolls. Bring your shoulders backward, up, forward, and down. Do this 10 times. Try to make the circles as big as you can and move both shoulders at the same time. If you have some tightness across your incision or chest, start with smaller circles and make them bigger as the tightness decreases. The backward direction might feel a little tighter across your chest than the forward direction. This will get better with practice.  Shoulder wings The shoulder wings exercise will help you get back outward movement of your shoulder. You can do this exercise while sitting or standing.  Place your hands on your chest or collarbone. Raise your elbows  out to the side, limiting your range of motion as instructed by your healthcare team. Slowly lower your elbows. Do this 10 times. Then, slowly lower your hands. If you feel discomfort while doing this exercise, hold your position and do the deep breathing exercise. If the discomfort passes, raise your elbows a little higher. If it doesnt pass, dont raise your elbows any higher. Finish the exercise raising your elbows only high enough to feel a gentle stretch and no discomfort.  Arm circles If you had surgery on both breasts, do this exercise with both arms, 1 arm at a time. Dont do this exercise with both arms at the same time. This will put too much pressure on your chest.  Stand with your feet slightly apart for balance. Raise your affected arm out to the side as high as you can, limiting your range of movement as instructed by your healthcare team. Start making slow, backward circles in the air with your arm. Make sure youre moving your  arm from your shoulder, not your elbow. Keep your elbow straight. Increase the size of the circles until theyre as big as you can comfortably make them, limiting your range of motion as instructed by your healthcare team. If you feel any aching or if your arm is tired, take a break. Keep doing the exercise when you feel better. Do 10 full backward circles. Then, slowly lower your arm to your side. Rest your arm for a moment. Follow steps 1 to 4 again, but this time make slow, forward circles.  W exercise You can do the W exercise while sitting or standing.  Form a W with your arms out to the side and palms facing forward (see Figure 4). Try to bring your hands up so theyre even with your face. If you cant raise your arms that high, bring them to the highest comfortable position. Make sure to limit your range of motion as instructed by your healthcare team. Pinch your shoulder blades together and downward, as if youre squeezing a pencil between them. If you feel discomfort, stop at that position and do the deep breathing exercise. If the discomfort passes, try to bring your arms back a little further. If it doesnt pass, dont reach any further. Hold the furthest position that doesnt cause discomfort. Squeeze your shoulder blades together and downward for 5 seconds. Slowly bring your arms back down to the starting position. Repeat this movement 10 times.  Back Climb You can do the back climb stretch while sitting or standing. Youll need a timer or stopwatch.  Place your hands behind your back. Hold the hand on your affected side with your other hand. If you had surgery on both breasts, use the arm that moves most easily to hold the other. Slowly slide your hands up the center of your back as far as you can. If you feel tightness near your incision, stop at that position and do the deep breathing exercise. If the tightness decreases, try to slide your hands up a little further. If it  doesnt decrease, dont slide your hands up any further. Hold the highest position you can for 1 minute. Use your stopwatch or timer to keep track. You should feel a gentle stretch in your shoulder area. After 1 minute, slowly lower your hands.  Hands behind neck You can do the hands behind neck stretch while sitting or standing. Youll need a timer or stopwatch.  Clasp your hands together on your lap or in front  of you. Slowly raise your hands toward your head, keeping your elbows together in front of you, not out to the sides. Keep your head level. Dont bend your neck or head forward. Slide your hands over your head until you reach the back of your neck. When you get to this point, spread your elbows out to the sides. Hold this position for 1 minute. Use your stopwatch or timer to keep track. Breathe normally. Dont hold your breath as you stretch your body. If you have some tightness across your incision or chest, hold your position and do the deep breathing exercise. If the tightness decreases, continue with the movement. If the tightness stays the same, reach up and stretch your elbows back as best as you can without causing discomfort. Hold the position youre most comfortable in for 1 minute. Slowly come out of the stretch by bringing your elbows together and sliding your hands over your head. Then, slowly lower your arms.  Forward wall crawls Youll need 2 pieces of tape for the forward wall crawl exercise.  Stand facing a wall. Your toes should be about 6 inches (15 centimeters) from the wall. Reach as high as you can with your unaffected arm. Oneil that point with a piece of tape. This will be the goal for your affected arm. If you had surgery on both breasts, set your goal using the arm that moves most comfortably. Place both hands against the wall at a level thats comfortable. Crawl your fingers up the wall as far as you can, keeping them even with each other.. Try not to look up  toward your hands or arch your back. When you get to the point where you feel a good stretch, but not pain, do the deep breathing exercise. Return to the starting position by crawling your fingers back down the wall. Repeat the wall crawl 10 times. Each time you raise your hands, try to crawl a little bit higher. On the 10th crawl, use the other piece of tape to mark the highest point you reached with your affected arm. This will let you to see your progress each time you do this exercise. As you become more flexible, you may need to take a step closer to the wall so you can reach a little higher.   Side wall crawls Youll also need 2 pieces of tape for the side wall crawl exercise.  You shouldnt feel pain while doing this exercise. Its normal to feel some tightness or pulling across the side of your chest. Focus on your breathing until the tightness decreases. Breathe normally throughout this exercise. Dont hold your breath.  Be careful not to turn your body toward the wall while doing this exercise. Make sure only the side of your body faces the wall.  If you had surgery on both breasts, start with step 3.  Stand with your unaffected side closest to the wall, about 1 foot (30.5 centimeters) away from the wall. Reach as high as you can with your unaffected arm. Oneil that point with a piece of tape (see Figure 8). This will be the goal for your affected arm. Turn your body so your affected side is now closest to the wall. If you had surgery on both breasts, start with either side closest to the wall. Crawl your fingers up the wall as far as you can. When you get to the point where you feel a good stretch, but not pain, do the deep breathing exercise. Return to  the starting position by crawling your fingers back down the wall. Repeat this exercise 10 times. On your 10th crawl, use a piece of tape to mark the highest point you reached with your affected arm. This will let you see your progress  each time you do the exercise. If you had surgery on both breasts, repeat the exercise with your other arm.  Swelling After your surgery, you may have some swelling or puffiness in your hand or arm on your affected side. This is normal and usually goes away on its own.  If you notice swelling in your hand or arm, follow the tips below to help the swelling go away.  Raise your arm above your head and do hand pumps several times a day. To do hand pumps, slowly open and close your fist 10 times. This will help drain the fluid out of your arm. Dont hold your arm straight up over your head for more than a few minutes. This can cause your arm muscles to get tired. Raise your arm to the side a few times a day for about 20 minutes at a time. To do this, sit or lie down on your back. Rest your arm on a few pillows next to you so its raised above the level of your heart. If youre able to sleep on your unaffected side, you can place 1 or 2 pillows in front of you and rest your affected arm on them while you sleep. If the swelling doesnt go down within 4 to 6 weeks, call your surgeon or nurse.

## 2024-01-14 ENCOUNTER — Other Ambulatory Visit: Payer: Self-pay | Admitting: *Deleted

## 2024-01-14 ENCOUNTER — Encounter: Admitting: General Surgery

## 2024-01-14 ENCOUNTER — Encounter (HOSPITAL_COMMUNITY): Payer: Self-pay | Admitting: General Surgery

## 2024-01-14 MED ORDER — ALENDRONATE SODIUM 70 MG PO TABS: ORAL_TABLET | ORAL | 1 refills | Status: AC

## 2024-01-15 ENCOUNTER — Encounter (HOSPITAL_COMMUNITY): Payer: Self-pay | Admitting: General Surgery

## 2024-01-17 ENCOUNTER — Encounter (HOSPITAL_COMMUNITY): Payer: Self-pay | Admitting: General Surgery

## 2024-01-17 LAB — SURGICAL PATHOLOGY

## 2024-01-17 NOTE — Anesthesia Postprocedure Evaluation (Signed)
"   Anesthesia Post Note  Patient: Tamara King  Procedure(s) Performed: BREAST LUMPECTOMY WITH RADIO FREQUENCY LOCALIZER (Left: Breast) BIOPSY, LYMPH NODE, SENTINEL, AXILLARY (Left: Breast)  Patient location during evaluation: Phase II Anesthesia Type: General Level of consciousness: awake Pain management: pain level controlled Vital Signs Assessment: post-procedure vital signs reviewed and stable Respiratory status: spontaneous breathing and respiratory function stable Cardiovascular status: blood pressure returned to baseline and stable Postop Assessment: no headache and no apparent nausea or vomiting Anesthetic complications: no Comments: Late entry   No notable events documented.   Last Vitals:  Vitals:   01/13/24 1130 01/13/24 1216  BP:  116/60  Pulse:  66  Resp:  18  Temp: (!) 36.4 C 36.6 C  SpO2:  100%    Last Pain:  Vitals:   01/14/24 1356  TempSrc:   PainSc: 6                  Yvonna PARAS Leonidas Boateng      "

## 2024-01-18 ENCOUNTER — Other Ambulatory Visit: Payer: Self-pay | Admitting: Family Medicine

## 2024-01-18 DIAGNOSIS — R110 Nausea: Secondary | ICD-10-CM

## 2024-01-27 ENCOUNTER — Ambulatory Visit: Payer: Self-pay | Admitting: General Surgery

## 2024-01-27 NOTE — Progress Notes (Signed)
 Let patient know that her margins were clear and her lymph node had responded to the treatment and no cancer in the lymph node I removed.

## 2024-01-29 ENCOUNTER — Encounter: Payer: Self-pay | Admitting: General Surgery

## 2024-01-29 ENCOUNTER — Ambulatory Visit (INDEPENDENT_AMBULATORY_CARE_PROVIDER_SITE_OTHER): Admitting: General Surgery

## 2024-01-29 VITALS — BP 109/61 | HR 62 | Temp 97.6°F | Resp 14 | Ht 61.5 in | Wt 108.0 lb

## 2024-01-29 DIAGNOSIS — T148XXA Other injury of unspecified body region, initial encounter: Secondary | ICD-10-CM | POA: Insufficient documentation

## 2024-01-29 DIAGNOSIS — Z170 Estrogen receptor positive status [ER+]: Secondary | ICD-10-CM

## 2024-01-29 DIAGNOSIS — C50512 Malignant neoplasm of lower-outer quadrant of left female breast: Secondary | ICD-10-CM

## 2024-01-29 DIAGNOSIS — Z9889 Other specified postprocedural states: Secondary | ICD-10-CM | POA: Insufficient documentation

## 2024-01-29 NOTE — Progress Notes (Signed)
 [facility]  Doing well. No major issues. Did have some swelling in the left axilla.  BP 109/61   Pulse 62   Temp 97.6 F (36.4 C) (Oral)   Resp 14   Ht 5' 1.5 (1.562 m)   Wt 108 lb (49 kg)   SpO2 96%   BMI 20.08 kg/m  Left breast incision healing, no erythema or drainage, glue peeling Left axilla with seroma, incision healed  No left arm lymphedema No right arm lymphedema   Patient s/p Left partial mastectomy and sentinel lymph node biopsy. Doing well. Seroma did form.  Seroma of the left axilla. This should get better with time. If it is causing your issues we can drain it. Will reassess in a few weeks.  I have documented in your chart that you can use your right arm for blood pressure and IV access as it has been 20 years since your sentinel lymph node in the right axilla. As long as your heal well and have no swelling in the left arm, you can use your left arm for BP and IV access after June 2026 (but try to use the right if possible).  Follow up with Dr. Davonna. Continue your treatment as prescribed by Dr. Davonna.  Patient Active Problem List   Diagnosis Date Noted   History of sentinel lymph node dissection; YOU CAN USE THE RIGHT ARM FOR BP AND IV ACCESS 01/29/2024   Wound seroma 01/29/2024   Lung nodule 10/10/2023   Carcinoma of lower-outer quadrant of left breast in female, estrogen receptor positive (HCC) 08/27/2023   Vitamin D  deficiency 02/11/2020   Osteoporosis 12/14/2019   History of CVA (cerebrovascular accident) 07/21/2019   CKD (chronic kidney disease) stage 3, GFR 30-59 ml/min 10/22/2018   Varicose veins of both lower extremities with pain 10/16/2018   Occlusion and stenosis of left carotid artery 10/22/2017   Idiopathic peripheral neuropathy 10/06/2013   GERD (gastroesophageal reflux disease) 07/06/2013   Osteoarthritis 07/06/2013   Essential (primary) hypertension 07/06/2013   Hyperlipidemia 11/07/2008   Lower extremity edema  11/07/2008   Renal artery stenosis 07/29/2008     Future Appointments  Date Time Provider Department Center  02/11/2024  9:20 AM Tamara Jayson MATSU, Tamara King [facility]  02/19/2024 10:30 AM Tamara King BROCKS, Tamara King RS-RS None  03/17/2024  9:00 AM AP-ACAPA LAB [facility] None  03/17/2024 10:00 AM AP-CT 1 AP-CT [place] [facility]  03/24/2024  2:40 PM Tamara Siad, Tamara King [facility] None  05/12/2024  1:00 PM MC-CV EDEN US  1 CV-EDEN None  06/02/2024  1:00 PM HVC-VASC 2 HVC-ULTRA H&V  06/02/2024  2:20 PM Tamara Lonni PARAS, Tamara King VVS-HVCVS H&V  07/01/2024 10:00 AM [facility]-ANNUAL WELLNESS VISIT [facility] [address]  07/02/2024 10:05 AM Rakes, Rock HERO, FNP [facility] [phone] [address]    Tamara Kallie, Tamara King [facility] [address] [place], [place] [address] [phone] (office)

## 2024-01-29 NOTE — Patient Instructions (Addendum)
 Seroma of the left axilla. This should get better with time. If it is causing your issues we can drain it. Will reassess in a few weeks. I have documented in your chart that you can use your right arm for blood pressure and IV access as it has been 20 years since your sentinel lymph node in the right axilla. As long as your heal well and have no swelling in the left arm, you can use your left arm for BP and IV access after June 2026 (but try to use the right if possible).  Follow up with Dr. Davonna. Continue your treatment as prescribed by Dr. Davonna.

## 2024-02-11 ENCOUNTER — Ambulatory Visit: Attending: Cardiology | Admitting: Cardiology

## 2024-02-11 ENCOUNTER — Encounter: Payer: Self-pay | Admitting: Cardiology

## 2024-02-11 VITALS — BP 118/72 | HR 76 | Ht 61.0 in | Wt 108.0 lb

## 2024-02-11 DIAGNOSIS — E782 Mixed hyperlipidemia: Secondary | ICD-10-CM | POA: Diagnosis not present

## 2024-02-11 DIAGNOSIS — I701 Atherosclerosis of renal artery: Secondary | ICD-10-CM | POA: Diagnosis not present

## 2024-02-11 DIAGNOSIS — I10 Essential (primary) hypertension: Secondary | ICD-10-CM | POA: Diagnosis not present

## 2024-02-11 DIAGNOSIS — I6523 Occlusion and stenosis of bilateral carotid arteries: Secondary | ICD-10-CM | POA: Diagnosis not present

## 2024-02-11 NOTE — Progress Notes (Signed)
"  ° ° °  Cardiology Office Note  Date: 02/11/2024   ID: Schelly, Chuba 09-11-1940, MRN [id number]  History of Present Illness: Tamara King is an 84 y.o. female last seen in April 2025.  She is here today for a routine visit.  Reports no major change in terms of functional capacity or stamina.  I see that she underwent left breast surgery in December for treatment of breast cancer with positive sentinel node.  She continues to follow with oncology.  We went over her medications which are stable from a vascular perspective.  She will have follow-up carotid Dopplers in April of this year.  LDL 60 at last check and her blood pressure is well-controlled today.  Physical Exam: VS:  BP 118/72 (BP Location: Right Arm)   Pulse 76   Ht 5' 1 (1.549 m)   Wt 108 lb (49 kg)   SpO2 97%   BMI 20.41 kg/m , BMI Body mass index is 20.41 kg/m.  Wt Readings from Last 3 Encounters:  02/11/24 108 lb (49 kg)  01/29/24 108 lb (49 kg)  01/13/24 108 lb 11 oz (49.3 kg)    General: Patient appears comfortable at rest. HEENT: Conjunctiva and lids normal. Neck: Supple, no elevated JVP, bilateral carotid bruits, right greater than left. Lungs: Clear to auscultation, nonlabored breathing at rest. Cardiac: Regular rate and rhythm, no S3, 2/6 systolic murmur, no pericardial rub.  ECG:  An ECG dated 01/07/2024 was personally reviewed today and demonstrated:  Sinus rhythm with prolonged PR interval and lead motion artifact.  Labwork: 07/03/2023: TSH 2.230 12/02/2023: Hemoglobin 13.7; Platelets 219 01/02/2024: ALT 9; AST 20; BUN 14; Creatinine, Ser 1.38; Potassium 3.8; Sodium 141     Component Value Date/Time   CHOL 163 07/03/2023 1041   TRIG 106 07/03/2023 1041   HDL 84 07/03/2023 1041   CHOLHDL 1.9 07/03/2023 1041   LDLCALC 60 07/03/2023 1041   Other Studies Reviewed Today:  No interval cardiac testing for review today.  Assessment and Plan:  1.  Bilateral carotid artery disease with known  occlusion of the LICA and 40 to 59% RICA stenosis by follow-up carotid Dopplers in April 2025.  She will have follow-up imaging in April of this year.  Remains asymptomatic.  Continue Plavix  75 mg daily and Crestor  40 mg daily.   2.  Bilateral renal artery stenosis status post previous stent intervention and angioplasty due to in-stent restenosis.  She followed with Dr. Darron previously, now on an as needed basis.  Blood pressure is normal today.  No clear indication for repeat imaging at this time.   3.  Primary hypertension.  Blood pressure well-controlled today.  Continue Norvasc  5 mg daily and Coreg  25 mg twice daily.   4.  History of nonobstructive coronary atherosclerosis as of 2003.  Continue antiplatelet therapy and statin.   5.  CKD stage IIIb, creatinine 1.38 with GFR 38 in December 2025.  6.  Mixed hyperlipidemia, LDL 60 and HDL 84 in June 7974.  She is on Crestor  40 mg daily.  Disposition:  Follow up 6 months.  Signed, Jayson JUDITHANN Sierras, M.D., F.A.C.C. [facility] at [facility]

## 2024-02-11 NOTE — Patient Instructions (Signed)

## 2024-02-19 ENCOUNTER — Encounter: Admitting: General Surgery

## 2024-02-27 ENCOUNTER — Encounter: Admitting: General Surgery

## 2024-03-03 ENCOUNTER — Encounter: Admitting: General Surgery

## 2024-03-17 ENCOUNTER — Inpatient Hospital Stay: Attending: Oncology

## 2024-03-17 ENCOUNTER — Other Ambulatory Visit (HOSPITAL_COMMUNITY)

## 2024-03-24 ENCOUNTER — Inpatient Hospital Stay: Attending: Oncology | Admitting: Oncology

## 2024-05-12 ENCOUNTER — Encounter

## 2024-06-02 ENCOUNTER — Ambulatory Visit (HOSPITAL_COMMUNITY)

## 2024-06-02 ENCOUNTER — Encounter: Admitting: Vascular Surgery

## 2024-07-01 ENCOUNTER — Ambulatory Visit: Payer: Self-pay

## 2024-07-02 ENCOUNTER — Ambulatory Visit: Admitting: Family Medicine
